# Patient Record
Sex: Male | Born: 1960
Health system: Southern US, Community
[De-identification: ages and names within clinical notes are randomized; demographics above are authoritative.]

## PROBLEM LIST (undated history)

## (undated) DIAGNOSIS — N189 Chronic kidney disease, unspecified: Secondary | ICD-10-CM

## (undated) DIAGNOSIS — Z86010 Personal history of colonic polyps: Secondary | ICD-10-CM

## (undated) DIAGNOSIS — Q613 Polycystic kidney, unspecified: Secondary | ICD-10-CM

## (undated) DIAGNOSIS — I10 Essential (primary) hypertension: Secondary | ICD-10-CM

## (undated) DIAGNOSIS — K219 Gastro-esophageal reflux disease without esophagitis: Secondary | ICD-10-CM

## (undated) DIAGNOSIS — I428 Other cardiomyopathies: Secondary | ICD-10-CM

## (undated) DIAGNOSIS — I440 Atrioventricular block, first degree: Secondary | ICD-10-CM

## (undated) DIAGNOSIS — E119 Type 2 diabetes mellitus without complications: Secondary | ICD-10-CM

## (undated) DIAGNOSIS — F1011 Alcohol abuse, in remission: Secondary | ICD-10-CM

## (undated) DIAGNOSIS — I509 Heart failure, unspecified: Secondary | ICD-10-CM

## (undated) DIAGNOSIS — E781 Pure hyperglyceridemia: Secondary | ICD-10-CM

## (undated) DIAGNOSIS — Z794 Long term (current) use of insulin: Secondary | ICD-10-CM

## (undated) DIAGNOSIS — I5022 Chronic systolic (congestive) heart failure: Secondary | ICD-10-CM

## (undated) DIAGNOSIS — N200 Calculus of kidney: Secondary | ICD-10-CM

## (undated) DIAGNOSIS — M1990 Unspecified osteoarthritis, unspecified site: Secondary | ICD-10-CM

## (undated) DIAGNOSIS — M109 Gout, unspecified: Secondary | ICD-10-CM

## (undated) DIAGNOSIS — D649 Anemia, unspecified: Secondary | ICD-10-CM

## (undated) HISTORY — DX: Other cardiomyopathies: I42.8

## (undated) HISTORY — PX: RHINOPLASTY: SUR1284

## (undated) HISTORY — PX: CARDIAC CATHETERIZATION: SHX172

## (undated) HISTORY — DX: Personal history of colonic polyps: Z86.010

## (undated) HISTORY — PX: KIDNEY STONE SURGERY: SHX686

## (undated) HISTORY — DX: Pure hyperglyceridemia: E78.1

## (undated) HISTORY — PX: TRANSTHORACIC ECHOCARDIOGRAM: SHX275

## (undated) HISTORY — DX: Chronic systolic (congestive) heart failure: I50.22

## (undated) HISTORY — DX: Essential (primary) hypertension: I10

## (undated) HISTORY — DX: Chronic kidney disease, unspecified: N18.9

## (undated) HISTORY — DX: Alcohol abuse, in remission: F10.11

## (undated) HISTORY — DX: Anemia, unspecified: D64.9

## (undated) HISTORY — PX: OTHER SURGICAL HISTORY: SHX169

## (undated) HISTORY — DX: Gout, unspecified: M10.9

---

## 2003-03-29 ENCOUNTER — Emergency Department (HOSPITAL_COMMUNITY): Admission: EM | Admit: 2003-03-29 | Discharge: 2003-03-29 | Payer: Self-pay | Admitting: Emergency Medicine

## 2003-05-19 ENCOUNTER — Emergency Department (HOSPITAL_COMMUNITY): Admission: EM | Admit: 2003-05-19 | Discharge: 2003-05-19 | Payer: Self-pay | Admitting: Emergency Medicine

## 2003-08-10 ENCOUNTER — Inpatient Hospital Stay (HOSPITAL_COMMUNITY): Admission: RE | Admit: 2003-08-10 | Discharge: 2003-08-12 | Payer: Self-pay | Admitting: Neurosurgery

## 2003-08-10 HISTORY — PX: LUMBAR FUSION: SHX111

## 2003-09-20 ENCOUNTER — Encounter: Admission: RE | Admit: 2003-09-20 | Discharge: 2003-09-20 | Payer: Self-pay | Admitting: Neurosurgery

## 2003-11-21 ENCOUNTER — Encounter: Admission: RE | Admit: 2003-11-21 | Discharge: 2003-11-21 | Payer: Self-pay | Admitting: Neurosurgery

## 2004-03-24 ENCOUNTER — Emergency Department (HOSPITAL_COMMUNITY): Admission: EM | Admit: 2004-03-24 | Discharge: 2004-03-24 | Payer: Self-pay | Admitting: *Deleted

## 2005-11-03 ENCOUNTER — Emergency Department (HOSPITAL_COMMUNITY): Admission: EM | Admit: 2005-11-03 | Discharge: 2005-11-03 | Payer: Self-pay | Admitting: Family Medicine

## 2005-12-04 ENCOUNTER — Emergency Department (HOSPITAL_COMMUNITY): Admission: EM | Admit: 2005-12-04 | Discharge: 2005-12-04 | Payer: Self-pay | Admitting: Family Medicine

## 2007-02-10 ENCOUNTER — Encounter: Admission: RE | Admit: 2007-02-10 | Discharge: 2007-03-03 | Payer: Self-pay | Admitting: Occupational Medicine

## 2007-11-30 ENCOUNTER — Encounter: Admission: RE | Admit: 2007-11-30 | Discharge: 2007-11-30 | Payer: Self-pay | Admitting: Internal Medicine

## 2010-03-24 ENCOUNTER — Emergency Department (HOSPITAL_COMMUNITY)
Admission: EM | Admit: 2010-03-24 | Discharge: 2010-03-24 | Payer: Self-pay | Source: Home / Self Care | Admitting: Family Medicine

## 2010-03-24 ENCOUNTER — Inpatient Hospital Stay (HOSPITAL_COMMUNITY): Admission: EM | Admit: 2010-03-24 | Discharge: 2010-03-27 | Payer: Self-pay | Admitting: Emergency Medicine

## 2010-03-25 ENCOUNTER — Encounter (INDEPENDENT_AMBULATORY_CARE_PROVIDER_SITE_OTHER): Payer: Self-pay | Admitting: Internal Medicine

## 2010-03-26 ENCOUNTER — Encounter: Payer: Self-pay | Admitting: Cardiology

## 2010-03-28 ENCOUNTER — Ambulatory Visit: Payer: Self-pay | Admitting: Cardiology

## 2010-03-28 ENCOUNTER — Telehealth (INDEPENDENT_AMBULATORY_CARE_PROVIDER_SITE_OTHER): Payer: Self-pay | Admitting: *Deleted

## 2010-03-29 LAB — CONVERTED CEMR LAB
BUN: 14 mg/dL (ref 6–23)
CO2: 28 meq/L (ref 19–32)
Calcium: 9.6 mg/dL (ref 8.4–10.5)
Chloride: 107 meq/L (ref 96–112)
Creatinine, Ser: 1.3 mg/dL (ref 0.4–1.5)
GFR calc non Af Amer: 73.36 mL/min (ref >60)
Glucose, Bld: 97 mg/dL (ref 70–99)
Potassium: 4.4 meq/L (ref 3.5–5.1)
Sodium: 142 meq/L (ref 135–145)

## 2010-04-02 DIAGNOSIS — I10 Essential (primary) hypertension: Secondary | ICD-10-CM

## 2010-04-02 DIAGNOSIS — F1010 Alcohol abuse, uncomplicated: Secondary | ICD-10-CM | POA: Insufficient documentation

## 2010-04-02 DIAGNOSIS — F17200 Nicotine dependence, unspecified, uncomplicated: Secondary | ICD-10-CM | POA: Insufficient documentation

## 2010-04-03 ENCOUNTER — Telehealth: Payer: Self-pay | Admitting: Cardiology

## 2010-04-03 ENCOUNTER — Encounter: Payer: Self-pay | Admitting: Physician Assistant

## 2010-04-03 ENCOUNTER — Ambulatory Visit: Payer: Self-pay | Admitting: Cardiovascular Disease

## 2010-04-03 DIAGNOSIS — I428 Other cardiomyopathies: Secondary | ICD-10-CM

## 2010-04-03 DIAGNOSIS — I5022 Chronic systolic (congestive) heart failure: Secondary | ICD-10-CM

## 2010-04-04 ENCOUNTER — Telehealth (INDEPENDENT_AMBULATORY_CARE_PROVIDER_SITE_OTHER): Payer: Self-pay | Admitting: *Deleted

## 2010-04-04 LAB — CONVERTED CEMR LAB
BUN: 21 mg/dL (ref 6–23)
CO2: 31 meq/L (ref 19–32)
Calcium: 10.2 mg/dL (ref 8.4–10.5)
Chloride: 105 meq/L (ref 96–112)
Creatinine, Ser: 1.4 mg/dL (ref 0.4–1.5)
GFR calc non Af Amer: 67.46 mL/min (ref >60)
Glucose, Bld: 104 mg/dL — ABNORMAL HIGH (ref 70–99)
Potassium: 4 meq/L (ref 3.5–5.1)
Sodium: 143 meq/L (ref 135–145)

## 2010-04-05 ENCOUNTER — Telehealth (INDEPENDENT_AMBULATORY_CARE_PROVIDER_SITE_OTHER): Payer: Self-pay | Admitting: *Deleted

## 2010-04-08 ENCOUNTER — Telehealth (INDEPENDENT_AMBULATORY_CARE_PROVIDER_SITE_OTHER): Payer: Self-pay | Admitting: *Deleted

## 2010-04-09 ENCOUNTER — Encounter (INDEPENDENT_AMBULATORY_CARE_PROVIDER_SITE_OTHER): Payer: Self-pay | Admitting: *Deleted

## 2010-04-09 ENCOUNTER — Telehealth: Payer: Self-pay | Admitting: Cardiology

## 2010-04-29 ENCOUNTER — Telehealth: Payer: Self-pay | Admitting: Cardiology

## 2010-05-07 ENCOUNTER — Ambulatory Visit: Payer: Self-pay | Admitting: Cardiology

## 2010-05-21 ENCOUNTER — Telehealth: Payer: Self-pay | Admitting: Cardiology

## 2010-05-27 ENCOUNTER — Emergency Department (HOSPITAL_COMMUNITY)
Admission: EM | Admit: 2010-05-27 | Discharge: 2010-05-27 | Payer: Self-pay | Source: Home / Self Care | Admitting: Family Medicine

## 2010-06-03 LAB — POCT URINALYSIS DIPSTICK
Bilirubin Urine: NEGATIVE
Hgb urine dipstick: NEGATIVE
Ketones, ur: NEGATIVE mg/dL
Nitrite: NEGATIVE
Protein, ur: NEGATIVE mg/dL
Specific Gravity, Urine: 1.025 (ref 1.005–1.030)
Urine Glucose, Fasting: NEGATIVE mg/dL
Urobilinogen, UA: 0.2 mg/dL (ref 0.0–1.0)
pH: 6.5 (ref 5.0–8.0)

## 2010-06-17 ENCOUNTER — Telehealth: Payer: Self-pay | Admitting: Internal Medicine

## 2010-06-17 ENCOUNTER — Other Ambulatory Visit: Payer: Self-pay | Admitting: Internal Medicine

## 2010-06-17 ENCOUNTER — Ambulatory Visit
Admission: RE | Admit: 2010-06-17 | Discharge: 2010-06-17 | Payer: Self-pay | Source: Home / Self Care | Attending: Internal Medicine | Admitting: Internal Medicine

## 2010-06-17 ENCOUNTER — Encounter: Payer: Self-pay | Admitting: Internal Medicine

## 2010-06-17 DIAGNOSIS — R109 Unspecified abdominal pain: Secondary | ICD-10-CM | POA: Insufficient documentation

## 2010-06-17 DIAGNOSIS — J3089 Other allergic rhinitis: Secondary | ICD-10-CM | POA: Insufficient documentation

## 2010-06-17 LAB — BASIC METABOLIC PANEL
BUN: 14 mg/dL (ref 6–23)
CO2: 29 mEq/L (ref 19–32)
Calcium: 9.2 mg/dL (ref 8.4–10.5)
Chloride: 104 mEq/L (ref 96–112)
Creatinine, Ser: 1 mg/dL (ref 0.4–1.5)
GFR: 105.49 mL/min (ref >60.00)
Glucose, Bld: 94 mg/dL (ref 70–99)
Potassium: 4 mEq/L (ref 3.5–5.1)
Sodium: 139 mEq/L (ref 135–145)

## 2010-06-17 LAB — CONVERTED CEMR LAB

## 2010-06-17 LAB — URINALYSIS, ROUTINE W REFLEX MICROSCOPIC
Bilirubin Urine: NEGATIVE
Hemoglobin, Urine: NEGATIVE
Ketones, ur: NEGATIVE
Leukocytes, UA: NEGATIVE
Nitrite: NEGATIVE
Specific Gravity, Urine: 1.015 (ref 1.000–1.030)
Total Protein, Urine: NEGATIVE
Urine Glucose: NEGATIVE
Urobilinogen, UA: 0.2 (ref 0.0–1.0)
pH: 6 (ref 5.0–8.0)

## 2010-06-17 LAB — CBC WITH DIFFERENTIAL/PLATELET
Basophils Absolute: 0 10*3/uL (ref 0.0–0.1)
Basophils Relative: 0.4 % (ref 0.0–3.0)
Eosinophils Absolute: 0.1 10*3/uL (ref 0.0–0.7)
Eosinophils Relative: 2.2 % (ref 0.0–5.0)
HCT: 35.9 % — ABNORMAL LOW (ref 39.0–52.0)
Hemoglobin: 12 g/dL — ABNORMAL LOW (ref 13.0–17.0)
Lymphocytes Relative: 35.6 % (ref 12.0–46.0)
Lymphs Abs: 1.6 10*3/uL (ref 0.7–4.0)
MCHC: 33.5 g/dL (ref 30.0–36.0)
MCV: 92.3 fl (ref 78.0–100.0)
Monocytes Absolute: 0.3 10*3/uL (ref 0.1–1.0)
Monocytes Relative: 5.6 % (ref 3.0–12.0)
Neutro Abs: 2.6 10*3/uL (ref 1.4–7.7)
Neutrophils Relative %: 56.2 % (ref 43.0–77.0)
Platelets: 298 10*3/uL (ref 150.0–400.0)
RBC: 3.89 Mil/uL — ABNORMAL LOW (ref 4.22–5.81)
RDW: 13.4 % (ref 11.5–14.6)
WBC: 4.6 10*3/uL (ref 4.5–10.5)

## 2010-06-17 LAB — HEPATIC FUNCTION PANEL
ALT: 35 U/L (ref 0–53)
AST: 25 U/L (ref 0–37)
Albumin: 4.2 g/dL (ref 3.5–5.2)
Alkaline Phosphatase: 62 U/L (ref 39–117)
Bilirubin, Direct: 0.1 mg/dL (ref 0.0–0.3)
Total Bilirubin: 0.8 mg/dL (ref 0.3–1.2)
Total Protein: 7.6 g/dL (ref 6.0–8.3)

## 2010-06-17 LAB — TSH: TSH: 1.71 u[IU]/mL (ref 0.35–5.50)

## 2010-06-17 LAB — PSA: PSA: 0.91 ng/mL (ref 0.10–4.00)

## 2010-06-17 LAB — AMYLASE: Amylase: 85 U/L (ref 27–131)

## 2010-06-17 LAB — LIPASE: Lipase: 25 U/L (ref 11.0–59.0)

## 2010-06-18 NOTE — Progress Notes (Signed)
Summary: paperwork signed? pt wants call**pt has question**  Phone Note Call from Patient Call back at Home Phone [phone]   Caller: Patient  Reason for Call: Talk to Nurse Summary of Call: pt calling to see if paperwork has been signed -needs to pick up asap-pls call either way [phone] Initial call taken by: Glynda Jaeger,  April 08, 2010 11:02 AM  Follow-up for Phone Call        spoke with pt, will need to contact [employer] to find out about paperwork Deliah Goody, RN  April 08, 2010 5:06 PM   Additional Follow-up for Phone Call Additional follow up Details #1::        pt wants to know what paperwork do you have [place] or fmla Omer Jack  April 09, 2010 9:16 AM   spoke with pt, paperwork here for dr Jens Som to sign. pt aware dr Jens Som out until monday. pt very upset and wants to talk to the person over [employer]. information given to pt Deliah Goody, RN  April 09, 2010 10:06 AM      Appended Document: paperwork signed? pt wants call**pt has question** Returned call to [facility] and spoke to patient. Leave specialist Hubbard Robinson on vacation until next week concerning FMLA paperwork. Mr Geesey is ok with delay for FMLA form.   Called [employer] to inform them of Dr. Ludwig Clarks return date of 10/28. Claim is good until Tuesday 11/29. OK to fax after completion.  [employer] Fax [phone], Phone [phone].On fax cover sheet please indicate insured ID Number [id number].  Called Mr. Froelich back-he is satisfied with outcome. Lenard Forth, April 09, 2010 10:17am

## 2010-06-18 NOTE — Progress Notes (Signed)
----   Converted from flag ---- Flag Received   ---- 04/05/2010 12:21 PM, Bary Leriche wrote: Hello,  I am sending Dr. Jens Som  three illness statements, for his patient Mike Orozco dob. [birth year]. Some have highlighted areas that the Dr. Lasandra Beech review.  I could not answer these questions by looking at the office notes, so they are highlighted for the Dr.  Since there are 3 different forms, hence they need three signatures by Dr.   The patient is eager to have these forms completed so he can receive payment from his enployer and [employer].  If you could expidite I am sure Mr. Graumann would be most appreciative. Thank you, Elease Hashimoto at [employer]. ------------------------------

## 2010-06-18 NOTE — Progress Notes (Signed)
  Walk in Patient Form Recieved " pt left FMLA papers" sent to [facility]  March 28, 2010 1:01 PM

## 2010-06-18 NOTE — Letter (Signed)
Summary: Return To Work  [employer], Main Office  [address]   [place], [place] [address]   Phone: [phone]  Fax: [phone]    04/09/2010  TO: WHOM IT MAY CONCERN   RE: Mike Orozco [address] [id number]   The above named individual is under my medical care and may return to work [id number] 04-10-10 WITH NO RESTRICTIONS  If you have any further questions or need additional information, please call.     Sincerely, Deliah Goody, RN/Dr Olga Millers

## 2010-06-18 NOTE — Cardiovascular Report (Signed)
Summary: [facility]   [facility]   Imported By: Earl Many 04/09/2010 15:11:39  _____________________________________________________________________  External Attachment:    Type:   Image     Comment:   External Document

## 2010-06-18 NOTE — Progress Notes (Signed)
Summary: PT NEEDS NOTE FOR WORK  Phone Note Call from Patient Call back at Home Phone [phone]   Caller: Patient Reason for Call: Talk to Nurse, Talk to Doctor Summary of Call: PT NEEDS A MD NOTE TO RETURN TO WORK TOMORROW Initial call taken by: Omer Jack,  April 09, 2010 11:05 AM  Follow-up for Phone Call        note generated and placed at the front desk for pick up, pt aware Deliah Goody, RN  April 09, 2010 11:26 AM

## 2010-06-18 NOTE — Progress Notes (Signed)
Summary: form re going back to work  Phone Note Call from Patient Call back at [employer] [phone]   Caller: Patient Reason for Call: Talk to Nurse Summary of Call: pt would like to know if you receive forms to return to work. Initial call taken by: Roe Coombs,  April 03, 2010 3:37 PM  Follow-up for Phone Call        He gave them to me today. I had them sent to [employer].  Follow-up by: Tereso Newcomer PA-C,  April 03, 2010 4:47 PM  Additional Follow-up for Phone Call Additional follow up Details #1::        pt aware Deliah Goody, RN  April 03, 2010 5:15 PM

## 2010-06-18 NOTE — Progress Notes (Signed)
  Papers left @ from Desk by Pt to be completed " from [facility] ) sent to [facility]  April 04, 2010 10:15 AM

## 2010-06-18 NOTE — Assessment & Plan Note (Signed)
Summary: EPH/MT   History of Present Illness: Primary Cardiologist:  Dr. Olga Millers  Mike Orozco is a 50 year old male who was recently admitted with acute systolic heart failure in the setting of hypertensive crisis.  Cardiac catheterization revealed normal coronary arteries.  He has a nonischemic cardiomyopathy.  He was diuresed with improvement in his symptoms and he returns to the office today for followup.  Since discharge, he is doing well.  He denies significant shortness of breath.  He describes New York Heart Association class II symptoms.  He denies orthopnea or PND.  He denies lower extremity edema.  He denies chest pain.  He denies palpitations.  He denies syncope.  It was felt that his nonischemic cardio myopathy was likely secondary to untreated hypertension and alcohol abuse.  The patient states that he has stopped drinking alcohol and has also quit smoking cigarettes.  He is taking all of his medications.  Current Medications (verified): 1)  Aspirin 81 Mg Tbec (Aspirin) .... Take One Tablet By Mouth Daily 2)  Carvedilol 25 Mg Tabs (Carvedilol) .... Take One Tablet By Mouth Twice A Day 3)  Furosemide 40 Mg Tabs (Furosemide) .... Take One Tablet By Mouth Daily. 4)  Lisinopril 40 Mg Tabs (Lisinopril) .... Take One Tablet By Mouth Daily  Past History:  Past Medical History: Chronic Systolic congestive heart failure Non Ischemic Cardiomyopathy    a.  Echo 03/25/2010:  EF 20-25%; mild AI; Mod MR; Mild LAE    b.  Cardiac cath 03/26/2010:  normal cors; severe pulmonary HTN; PCWP 41; EF 10-15% h/o EtOH abuse.   Past Surgical History: Removal of a cyst from his left lung in 1994 at [facility].  Status post L5-S1 fusion.      Family History: Reviewed history from 04/02/2010 and no changes required.  Significant for diabetes and hypertension in both   parents and multiple siblings.   Social History: Smoker . . .quit 02/2010 + alcohol abuse . . . quit 02/2010 Denies illicit drug  use.   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  Vital Signs:  Patient profile:   50 year old male Height:      66 inches (167.64 cm) Weight:      192 pounds (87.27 kg) BMI:     31.10 Pulse rate:   66 / minute Resp:     14 per minute BP sitting:   138 / 89  (left arm)  Vitals Entered By: Kem Parkinson (April 03, 2010 8:32 AM)  Physical Exam  General:  Well nourished, well developed, in no acute distress HEENT: normal Neck: no JVD Cardiac:  normal S1, S2; RRR; no murmur; no gallop Lungs:  clear to auscultation bilaterally, no wheezing, rhonchi or rales Abd: soft, nontender, no hepatomegaly Ext: no edema; R FA site without hematoma or bruit Vascular: no carotid  bruits Skin: warm and dry Neuro:  CNs 2-12 intact, no focal abnormalities noted    EKG  Procedure date:  04/03/2010  Findings:      Normal sinus rhythm Normal axis LVH with repolarization abnormality Heart rate 62  Impression & Recommendations:  Problem # 1:  CARDIOMYOPATHY (ICD-425.4)  Nonischemic related to alcohol and untreated HTN. BP improved. Symptoms are stable on a good medical regimen. Repeat Echo in 06/2010 to reassess EF. Add hydralazine as below. Follow up with Dr. Jens Som in 1 month.  Problem # 2:  CHRONIC SYSTOLIC HEART FAILURE (ICD-428.22)  Optivolemic today. Check BMET.  Orders: TLB-BMP (  Basic Metabolic Panel-BMET) (80048-METABOL) Echocardiogram (Echo)  Problem # 3:  HYPERTENSION (ICD-401.9)  Still elevated.  BP by me 134/100. Add Hydralazine 10 mg three times a day. Check BMET today.  Problem # 4:  TOBACCO ABUSE (ICD-305.1)  He has quit.  Problem # 5:  ALCOHOL ABUSE (ICD-305.00) He has quit.  Other Orders: EKG w/ Interpretation (93000)  Patient Instructions: 1)  Your physician recommends that you schedule a follow-up appointment in: 4 WEEKS WITH CRENSHAW 2)  Your physician recommends that you return for lab work ZO:XWRUE FOR BMET 3)  Your  physician has recommended you make the following change in your medication: START HYDRALAZINE 10MG  three times a day   4)  NEED RTW NOTE IN 1 WEEK. Prescriptions: HYDRALAZINE HCL 10 MG TABS (HYDRALAZINE HCL) Take one tablet by mouth three times a day  #90 x 6   Entered by:   Dessie Coma  LPN   Authorized by:   Tereso Newcomer PA-C   Signed by:   Dessie Coma  LPN on 45/40/9811   Method used:   [facility] [phone][facility] (retail)       [address]       [place], [place]  [address]       Ph: [phone]       Fax: [phone]   RxID:   [id number]  I have personally reviewed the prescriptions today for accuracy. Tereso Newcomer PA-C  April 03, 2010 4:48 PM

## 2010-06-20 NOTE — Progress Notes (Signed)
Summary: medication question  Phone Note Call from Patient Call back at [phone]   Caller: Patient Reason for Call: Talk to Nurse, Talk to Doctor Summary of Call: pt was taking HYDRALAZINE HCL 10 MG TABS Take one tablet by mouth three times a dayand at last visit he was told to take two pills a day until this was gone and we would send in a new Rx. Well he finished what he had but when he went to [facility] on [facility] they gave him the same medicine so he needs to know how he should be taking it or has it been a change and what he needs to do Initial call taken by: Omer Jack,  May 21, 2010 3:41 PM  Follow-up for Phone Call        spoke with pt, hydralazine should be 25mg  three times a day. pt aware to take 2 1/2 tablets of 10mg  three times a day until he runs out. Deliah Goody, RN  May 21, 2010 4:01 PM     New/Updated Medications: HYDRALAZINE HCL 25 MG TABS (HYDRALAZINE HCL) Take one tablet by mouth three times a day Prescriptions: HYDRALAZINE HCL 25 MG TABS (HYDRALAZINE HCL) Take one tablet by mouth three times a day  #90 x 12   Entered by:   Deliah Goody, RN   Authorized by:   Ferman Hamming, MD, [facility]   Signed by:   Deliah Goody, RN on 05/21/2010   Method used:   [facility] to        [facility] [phone]* (retail)       [address]       [place], [place]  [address]       Ph: [phone]       Fax: [phone]   RxID:   [id number]

## 2010-06-20 NOTE — Assessment & Plan Note (Signed)
Summary: per check out/sf   Visit Type:  Follow-up   History of Present Illness: 50 year old male recently admitted to [facility] in November of 2011 with congestive heart failure. An echocardiogram showed an ejection fraction of 20-25% and moderate mitral regurgitation. There was mild aortic insufficiency and mild left atrial enlargement. Cardiac catheterization revealed an ejection fraction of 10-15% and normal coronary arteries. His left ventricular end-diastolic pressure was 35 and his pulmonary pressures were severely elevated. The patient was treated medically with improvement in his symptoms. Note his cardiomyopathy was felt most likely secondary to hypertension or alcohol. Since he was discharged he is doing well symptomatically. He denies dyspnea on exertion, orthopnea, PND, pedal edema, palpitations, syncope or chest pain. He is being compliant with his medications and diet. He has discontinued his alcohol use.  Current Medications (verified): 1)  Aspirin 81 Mg Tbec (Aspirin) .... Take One Tablet By Mouth Daily 2)  Carvedilol 25 Mg Tabs (Carvedilol) .... Take One Tablet By Mouth Twice A Day 3)  Furosemide 40 Mg Tabs (Furosemide) .... Take One Tablet By Mouth Daily. 4)  Lisinopril 40 Mg Tabs (Lisinopril) .... Take One Tablet By Mouth Daily 5)  Hydralazine Hcl 10 Mg Tabs (Hydralazine Hcl) .... Take One Tablet By Mouth Three Times A Day  Allergies (verified): No Known Drug Allergies  Past History:  Past Medical History: Reviewed history from 04/03/2010 and no changes required. Chronic Systolic congestive heart failure Non Ischemic Cardiomyopathy    a.  Echo 03/25/2010:  EF 20-25%; mild AI; Mod MR; Mild LAE    b.  Cardiac cath 03/26/2010:  normal cors; severe pulmonary HTN; PCWP 41; EF 10-15% h/o EtOH abuse.   Past Surgical History: Reviewed history from 04/03/2010 and no changes required. Removal of a cyst from his left lung in 1994 at [facility].  Status post L5-S1 fusion.      Social History: Reviewed history from 04/03/2010 and no changes required. Smoker . . .quit 02/2010 + alcohol abuse . . . quit 02/2010 Denies illicit drug use.   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 Remaining systems are negative.   Vital Signs:  Patient profile:   50 year old male Height:      66 inches Weight:      191 pounds Pulse rate:   64 / minute Pulse rhythm:   regular BP sitting:   144 / 100  (left arm)  Physical Exam  General:  Well-developed well-nourished in no acute distress.  Skin is warm and dry.  HEENT is normal.  Neck is supple. No thyromegaly.  Chest is clear to auscultation with normal expansion.  Cardiovascular exam is regular rate and rhythm.  Abdominal exam nontender or distended. No masses palpated. Extremities show no edema. neuro grossly intact    Impression & Recommendations:  Problem # 1:  CARDIOMYOPATHY (ICD-425.4) Plan continue present medications. Blood pressure is elevated. Increase hydralazine to 25 mg p.o. t.i.d. Once his medications have been titrated plan repeat echocardiogram. If ejection fraction less than or equal to 35% he will need an ICD. His updated medication list for this problem includes:    Aspirin 81 Mg Tbec (Aspirin) .Marland Kitchen... Take one tablet by mouth daily    Carvedilol 25 Mg Tabs (Carvedilol) .Marland Kitchen... Take one tablet by mouth twice  a day    Furosemide 40 Mg Tabs (Furosemide) .Marland Kitchen... Take one tablet by mouth daily.    Lisinopril 40 Mg Tabs (Lisinopril) .Marland Kitchen... Take one tablet by mouth daily  Problem # 2:  ALCOHOL ABUSE (ICD-305.00) Now resolved.  Problem # 3:  TOBACCO ABUSE (ICD-305.1) Now resolved.  Problem # 4:  HYPERTENSION (ICD-401.9) Blood pressure elevated. Increase hydralazine as described  above. He will check his blood pressure at home and bring records for his next visit. His updated medication list for this problem includes:    Aspirin 81 Mg Tbec (Aspirin) .Marland Kitchen... Take one tablet by mouth daily    Carvedilol 25 Mg Tabs (Carvedilol) .Marland Kitchen... Take one tablet by mouth twice a day    Furosemide 40 Mg Tabs (Furosemide) .Marland Kitchen... Take one tablet by mouth daily.    Lisinopril 40 Mg Tabs (Lisinopril) .Marland Kitchen... Take one tablet by mouth daily    Hydralazine Hcl 10 Mg Tabs (Hydralazine hcl) .Marland Kitchen... Take one tablet by mouth three times a day  Patient Instructions: 1)  Your physician recommends that you schedule a follow-up appointment in: 8 WEEKS WITH DR CRENSHAW 2)  Your physician has recommended you make the following change in your medication: INCREASE HYDRALAZINE 25 MG three times a day

## 2010-06-20 NOTE — Progress Notes (Signed)
Summary: refill meds  Phone Note Refill Request Call back at Home Phone [phone] Message from:  Patient on April 29, 2010 10:43 AM  Refills Requested: Medication #1:  CARVEDILOL 25 MG TABS Take one tablet by mouth twice a day [phone]   Method Requested: Fax to Local Pharmacy Initial call taken by: Lorne Skeens,  April 29, 2010 10:44 AM    Prescriptions: CARVEDILOL 25 MG TABS (CARVEDILOL) Take one tablet by mouth twice a day  #60 x 12   Entered by:   Kem Parkinson   Authorized by:   Ferman Hamming, MD, [facility]   Signed by:   Kem Parkinson on 04/29/2010   Method used:   [facility]ronically to        [facility] [phone]* (retail)       [address]       [place], [place]  [address]       Ph: [phone]       Fax: [phone]   RxID:   [id number]

## 2010-06-20 NOTE — Miscellaneous (Signed)
  Clinical Lists Changes  Orders: Added new Referral order of Primary Care Referral (Primary) - Signed

## 2010-06-26 NOTE — Letter (Signed)
Summary: Results Follow-up Letter  [facility]  [address] [place], [place] [address]   Phone: [phone]  Fax: [phone]    06/17/2010  [address] [place], [place]  [address]  Dear Mr. RAUDENBUSH,   The following are the results of your recent test(s):  Test     Result     CBC       mild anemia Prostate     normal Liver/kidney   normal Urine       normal Thyroid     normal   _________________________________________________________  Please call for an appointment in 2-3 weeks for more anemia testing _________________________________________________________ _________________________________________________________ _________________________________________________________  Sincerely,  Sanda Linger MD [facility]

## 2010-06-26 NOTE — Assessment & Plan Note (Signed)
Summary: NEW/ [employer] #   Vital Signs:  Patient profile:   50 year old male Height:      66 inches Weight:      191 pounds BMI:     30.94 O2 Sat:      99 % on Room air Temp:     98.0 degrees F oral Pulse rate:   54 / minute Pulse rhythm:   regular Resp:     16 per minute BP sitting:   130 / 88  (left arm) Cuff size:   large  Vitals Entered By: Rock Nephew CMA (June 17, 2010 9:02 AM)  Nutrition Counseling: Patient's BMI is greater than 25 and therefore counseled on weight management options.  O2 Flow:  Room air CC: New to establish, Is Patient Diabetic? No Pain Assessment Patient in pain? no       Does patient need assistance? Functional Status Self care Ambulation Normal   Primary Care Provider:  Etta Grandchild MD  CC:  New to establish and .  History of Present Illness: New to me he complains of right flank pain for 3 weeks and he tells me that he went to an [facility] -  the pain is throbbing and intermittent. He has not taken anything for the pain. The pain is in his right lower/posterior area with no radiation. He does not fell like it is low back or hip pain.  Also, he complains of runny nose and nasal congestion for 3 days. He has been taking Tylenol cold and sinus.  Preventive Screening-Counseling & Management  Alcohol-Tobacco     Alcohol drinks/day: 0     Alcohol Counseling: not indicated; patient does not drink     Smoking Status: quit < 6 months     Year Quit: 2011     Tobacco Counseling: to remain off tobacco products  Caffeine-Diet-Exercise     Does Patient Exercise: no  Hep-HIV-STD-Contraception     Hepatitis Risk: no risk noted     HIV Risk: no risk noted     STD Risk: no risk noted      Sexual History:  currently monogamous.        Drug Use:  no.        Blood Transfusions:  no.    Clinical Review Panels:  Diabetes Management   Creatinine:  1.4 (04/03/2010)  Complete Metabolic Panel   Glucose:  104 (04/03/2010)   Sodium:  143  (04/03/2010)   Potassium:  4.0 (04/03/2010)   Chloride:  105 (04/03/2010)   CO2:  31 (04/03/2010)   BUN:  21 (04/03/2010)   Creatinine:  1.4 (04/03/2010)   Calcium:  10.2 (04/03/2010)   Medications Prior to Update: 1)  Aspirin 81 Mg Tbec (Aspirin) .... Take One Tablet By Mouth Daily 2)  Carvedilol 25 Mg Tabs (Carvedilol) .... Take One Tablet By Mouth Twice A Day 3)  Furosemide 40 Mg Tabs (Furosemide) .... Take One Tablet By Mouth Daily. 4)  Lisinopril 40 Mg Tabs (Lisinopril) .... Take One Tablet By Mouth Daily 5)  Hydralazine Hcl 25 Mg Tabs (Hydralazine Hcl) .... Take One Tablet By Mouth Three Times A Day  Current Medications (verified): 1)  Aspirin 81 Mg Tbec (Aspirin) .... Take One Tablet By Mouth Daily 2)  Carvedilol 25 Mg Tabs (Carvedilol) .... Take One Tablet By Mouth Twice A Day 3)  Furosemide 40 Mg Tabs (Furosemide) .... Take One Tablet By Mouth Daily. 4)  Lisinopril 40 Mg Tabs (Lisinopril) .... Take One Tablet By  Mouth Daily 5)  Hydralazine Hcl 25 Mg Tabs (Hydralazine Hcl) .... Take One Tablet By Mouth Three Times A Day 6)  Fexofenadine Hcl 180 Mg Tabs (Fexofenadine Hcl) .... One By Mouth Once Daily For Nasal Congestion  Allergies (verified): No Known Drug Allergies  Past History:  Past Medical History: Last updated: 04/03/2010 Chronic Systolic congestive heart failure Non Ischemic Cardiomyopathy    a.  Echo 03/25/2010:  EF 20-25%; mild AI; Mod MR; Mild LAE    b.  Cardiac cath 03/26/2010:  normal cors; severe pulmonary HTN; PCWP 41; EF 10-15% h/o EtOH abuse.   Past Surgical History: Last updated: 04/03/2010 Removal of a cyst from his left lung in 1994 at [facility].  Status post L5-S1 fusion.      Family History: Last updated: 04/02/2010  Significant for diabetes and hypertension in both   parents and multiple siblings.   Social History: Last updated: 06/17/2010 Smoker . . .quit 02/2010 + alcohol abuse . . . quit 02/2010 Denies illicit drug use.   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   He is married Drug use-no Regular exercise-no  Risk Factors: Alcohol Use: 0 (06/17/2010) Exercise: no (06/17/2010)  Risk Factors: Smoking Status: quit < 6 months (06/17/2010)  Family History: Reviewed history from 04/02/2010 and no changes required.  Significant for diabetes and hypertension in both   parents and multiple siblings.   Social History: Reviewed history from 04/03/2010 and no changes required. Smoker . . .quit 02/2010 + alcohol abuse . . . quit 02/2010 Denies illicit drug use.   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 Drug use-no Regular exercise-no Smoking Status:  quit < 6 months Hepatitis Risk:  no risk noted HIV Risk:  no risk noted STD Risk:  no risk noted Sexual History:  currently monogamous Blood Transfusions:  no Drug Use:  no Does Patient Exercise:  no  Review of Systems  The patient denies anorexia, fever, weight loss, weight gain, hoarseness, chest pain, syncope, dyspnea on exertion, peripheral edema, prolonged cough, headaches, hemoptysis, melena, hematochezia, severe indigestion/heartburn, hematuria, incontinence, muscle weakness, suspicious skin lesions, transient blindness, difficulty walking, enlarged lymph nodes, angioedema, and testicular masses.   General:  Denies chills, fatigue, fever, loss of appetite, malaise, sleep disorder, and sweats. ENT:  Complains of nasal congestion and postnasal drainage; denies decreased hearing, difficulty swallowing, ear discharge, earache, hoarseness, nosebleeds, ringing in ears, sinus pressure, and sore throat. GI:  Denies abdominal pain, diarrhea, loss of appetite, nausea, vomiting, vomiting blood, and yellowish skin color. GU:  Denies discharge, dysuria, hematuria, incontinence, nocturia, urinary frequency, and urinary hesitancy.  Physical Exam  General:  alert, well-developed, well-nourished, well-hydrated, appropriate dress, normal appearance,  healthy-appearing, cooperative to examination, good hygiene, and overweight-appearing.   Head:  normocephalic, atraumatic, no abnormalities observed, and no abnormalities palpated.   Eyes:  vision grossly intact, pupils equal, and pupils round.   Ears:  R ear normal and L ear normal.   Nose:  no external deformity, no airflow obstruction, no intranasal foreign body, no nasal polyps, no nasal mucosal lesions, no mucosal friability, no active bleeding or clots, no sinus percussion tenderness, no septum abnormalities, mucosal erythema, and mucosal edema.   Mouth:  good dentition, pharynx pink and moist, no erythema, no exudates, no posterior lymphoid hypertrophy, no postnasal drip, no pharyngeal crowing, no lesions, no aphthous ulcers, no erosions, no tongue abnormalities, no leukoplakia, and no petechiae.   Neck:  supple, full ROM, no masses, no thyromegaly,  no thyroid nodules or tenderness, no JVD, no HJR, normal carotid upstroke, no carotid bruits, no cervical lymphadenopathy, and no neck tenderness.   Lungs:  normal respiratory effort, no intercostal retractions, no accessory muscle use, normal breath sounds, no dullness, no fremitus, no crackles, and no wheezes.   Heart:  normal rate, regular rhythm, no murmur, no gallop, no rub, and no JVD.   Abdomen:  soft, non-tender, normal bowel sounds, no distention, no masses, no guarding, no rigidity, no rebound tenderness, no abdominal hernia, no inguinal hernia, no hepatomegaly, and no splenomegaly.   Rectal:  No external abnormalities noted. Normal sphincter tone. No rectal masses or tenderness. Genitalia:  uncircumcised, no hydrocele, no varicocele, no scrotal masses, no testicular masses or atrophy, no cutaneous lesions, and no urethral discharge.   Prostate:  no gland enlargement, no nodules, no asymmetry, and no induration.   Msk:  normal ROM, no joint tenderness, no joint swelling, no joint warmth, no redness over joints, no joint deformities, no joint  instability, and no crepitation.   Pulses:  R and L carotid,radial,femoral,dorsalis pedis and posterior tibial pulses are full and equal bilaterally Extremities:  No clubbing, cyanosis, edema, or deformity noted with normal full range of motion of all joints.   Neurologic:  No cranial nerve deficits noted. Station and gait are normal. Plantar reflexes are down-going bilaterally. DTRs are symmetrical throughout. Sensory, motor and coordinative functions appear intact. Skin:  turgor normal, color normal, no rashes, no suspicious lesions, no ecchymoses, no petechiae, no purpura, no ulcerations, and no edema.   Cervical Nodes:  no anterior cervical adenopathy and no posterior cervical adenopathy.   Axillary Nodes:  no R axillary adenopathy and no L axillary adenopathy.   Inguinal Nodes:  no R inguinal adenopathy and no L inguinal adenopathy.   Psych:  Cognition and judgment appear intact. Alert and cooperative with normal attention span and concentration. No apparent delusions, illusions, hallucinations   Impression & Recommendations:  Problem # 1:  ALLERGIC RHINITIS DUE TO OTHER ALLERGEN (ICD-477.8) Assessment New start Allegra, stop Tylenol cold and sinus due to CHF/Htn and concern about decongestants Orders: Venipuncture (16109) TLB-BMP (Basic Metabolic Panel-BMET) (80048-METABOL) TLB-CBC Platelet - w/Differential (85025-CBCD) TLB-Hepatic/Liver Function Pnl (80076-HEPATIC) TLB-TSH (Thyroid Stimulating Hormone) (84443-TSH) TLB-Amylase (82150-AMYL) TLB-Lipase (83690-LIPASE) TLB-PSA (Prostate Specific Antigen) (84153-PSA) TLB-Udip w/ Micro (81001-URINE)  Problem # 2:  FLANK PAIN, RIGHT (ICD-789.09) Assessment: New will look for secondary causes His updated medication list for this problem includes:    Aspirin 81 Mg Tbec (Aspirin) .Marland Kitchen... Take one tablet by mouth daily  Orders: Venipuncture (60454) TLB-BMP (Basic Metabolic Panel-BMET) (80048-METABOL) TLB-CBC Platelet - w/Differential  (85025-CBCD) TLB-Hepatic/Liver Function Pnl (80076-HEPATIC) TLB-TSH (Thyroid Stimulating Hormone) (84443-TSH) TLB-Amylase (82150-AMYL) TLB-Lipase (83690-LIPASE) TLB-PSA (Prostate Specific Antigen) (84153-PSA) TLB-Udip w/ Micro (81001-URINE)  Problem # 3:  CHRONIC SYSTOLIC HEART FAILURE (ICD-428.22) Assessment: Improved  His updated medication list for this problem includes:    Aspirin 81 Mg Tbec (Aspirin) .Marland Kitchen... Take one tablet by mouth daily    Carvedilol 25 Mg Tabs (Carvedilol) .Marland Kitchen... Take one tablet by mouth twice a day    Furosemide 40 Mg Tabs (Furosemide) .Marland Kitchen... Take one tablet by mouth daily.    Lisinopril 40 Mg Tabs (Lisinopril) .Marland Kitchen... Take one tablet by mouth daily  Problem # 4:  HYPERTENSION (ICD-401.9) Assessment: Improved  His updated medication list for this problem includes:    Carvedilol 25 Mg Tabs (Carvedilol) .Marland Kitchen... Take one tablet by mouth twice a day    Furosemide 40 Mg Tabs (Furosemide) .Marland KitchenMarland KitchenMarland KitchenMarland Kitchen  Take one tablet by mouth daily.    Lisinopril 40 Mg Tabs (Lisinopril) .Marland Kitchen... Take one tablet by mouth daily    Hydralazine Hcl 25 Mg Tabs (Hydralazine hcl) .Marland Kitchen... Take one tablet by mouth three times a day  Orders: Venipuncture (16109) TLB-BMP (Basic Metabolic Panel-BMET) (80048-METABOL) TLB-CBC Platelet - w/Differential (85025-CBCD) TLB-Hepatic/Liver Function Pnl (80076-HEPATIC) TLB-TSH (Thyroid Stimulating Hormone) (84443-TSH) TLB-Amylase (82150-AMYL) TLB-Lipase (83690-LIPASE) TLB-PSA (Prostate Specific Antigen) (84153-PSA) TLB-Udip w/ Micro (81001-URINE)  BP today: 130/88 Prior BP: 144/100 (05/07/2010)  Labs Reviewed: K+: 4.0 (04/03/2010) Creat: : 1.4 (04/03/2010)     Complete Medication List: 1)  Aspirin 81 Mg Tbec (Aspirin) .... Take one tablet by mouth daily 2)  Carvedilol 25 Mg Tabs (Carvedilol) .... Take one tablet by mouth twice a day 3)  Furosemide 40 Mg Tabs (Furosemide) .... Take one tablet by mouth daily. 4)  Lisinopril 40 Mg Tabs (Lisinopril) .... Take  one tablet by mouth daily 5)  Hydralazine Hcl 25 Mg Tabs (Hydralazine hcl) .... Take one tablet by mouth three times a day 6)  Fexofenadine Hcl 180 Mg Tabs (Fexofenadine hcl) .... One by mouth once daily for nasal congestion  Colorectal Screening:  Current Recommendations:    Hemoccult: NEG X 1 today  PSA Screening:    Reviewed PSA screening recommendations: PSA ordered  Patient Instructions: 1)  Please schedule a follow-up appointment in 1 month. 2)  It is important that you exercise regularly at least 20 minutes 5 times a week. If you develop chest pain, have severe difficulty breathing, or feel very tired , stop exercising immediately and seek medical attention. 3)  You need to lose weight. Consider a lower calorie diet and regular exercise.  4)  Get plenty of rest, drink lots of clear liquids, and use Tylenol or Ibuprofen for fever and comfort. Return in 7-10 days if you're not better:sooner if you're feeling worse. 5)  Take 650-1000mg  of Tylenol every 4-6 hours as needed for relief of pain or comfort of fever AVOID taking more than 4000mg   in a 24 hour period (can cause liver damage in higher doses). 6)  Take 400-600mg  of Ibuprofen (Advil, Motrin) with food every 4-6 hours as needed for relief of pain or comfort of fever. Prescriptions: FEXOFENADINE HCL 180 MG TABS (FEXOFENADINE HCL) One by mouth once daily for nasal congestion  #30 x 11   Entered and Authorized by:   Etta Grandchild MD   Signed by:   Etta Grandchild MD on 06/17/2010   Method used:   [facility] to        [facility] [phone]* (retail)       [address]       [place], [place]  [address]       Ph: [phone]       Fax: [phone]   RxID:   [id number]    Orders Added: 1)  Venipuncture [02725] 2)  TLB-BMP (Basic Metabolic Panel-BMET) [80048-METABOL] 3)  TLB-CBC Platelet - w/Differential [85025-CBCD] 4)  TLB-Hepatic/Liver Function Pnl [80076-HEPATIC] 5)  TLB-TSH  (Thyroid Stimulating Hormone) [84443-TSH] 6)  TLB-Amylase [82150-AMYL] 7)  TLB-Lipase [83690-LIPASE] 8)  TLB-PSA (Prostate Specific Antigen) [84153-PSA] 9)  TLB-Udip w/ Micro [81001-URINE] 10)  New Patient Level IV [36644]

## 2010-06-26 NOTE — Progress Notes (Signed)
Summary: CALL - RX ?   Phone Note Call from Patient Call back at Home Phone [phone]   Summary of Call: Patient is requesting a call back regarding rx that he was expecting to be a pharmacy.  Initial call taken by: Lamar Sprinkles, CMA,  June 17, 2010 2:31 PM  Follow-up for Phone Call        Per pt - pharm did not get rx for congestion - resent, Pt informed  Follow-up by: Lamar Sprinkles, CMA,  June 17, 2010 4:21 PM    Prescriptions: FEXOFENADINE HCL 180 MG TABS (FEXOFENADINE HCL) One by mouth once daily for nasal congestion  #30 x 11   Entered by:   Lamar Sprinkles, CMA   Authorized by:   Etta Grandchild MD   Signed by:   Lamar Sprinkles, CMA on 06/17/2010   Method used:   [facility]ically to        [facility] [phone][facility] (retail)       [address]       [place], [place]  [address]       Ph: [phone]       Fax: [phone]   RxID:   [id number]

## 2010-07-01 ENCOUNTER — Ambulatory Visit (INDEPENDENT_AMBULATORY_CARE_PROVIDER_SITE_OTHER): Payer: Managed Care, Other (non HMO) | Admitting: Cardiology

## 2010-07-01 ENCOUNTER — Ambulatory Visit (HOSPITAL_COMMUNITY): Payer: Managed Care, Other (non HMO) | Attending: Cardiology

## 2010-07-01 ENCOUNTER — Encounter: Payer: Self-pay | Admitting: Cardiology

## 2010-07-01 DIAGNOSIS — I10 Essential (primary) hypertension: Secondary | ICD-10-CM

## 2010-07-01 DIAGNOSIS — I428 Other cardiomyopathies: Secondary | ICD-10-CM

## 2010-07-01 DIAGNOSIS — I080 Rheumatic disorders of both mitral and aortic valves: Secondary | ICD-10-CM | POA: Insufficient documentation

## 2010-07-01 DIAGNOSIS — I5022 Chronic systolic (congestive) heart failure: Secondary | ICD-10-CM

## 2010-07-01 DIAGNOSIS — I509 Heart failure, unspecified: Secondary | ICD-10-CM

## 2010-07-03 ENCOUNTER — Encounter: Payer: Self-pay | Admitting: Internal Medicine

## 2010-07-03 ENCOUNTER — Ambulatory Visit (INDEPENDENT_AMBULATORY_CARE_PROVIDER_SITE_OTHER): Payer: Managed Care, Other (non HMO) | Admitting: Internal Medicine

## 2010-07-03 DIAGNOSIS — I428 Other cardiomyopathies: Secondary | ICD-10-CM

## 2010-07-03 DIAGNOSIS — I5022 Chronic systolic (congestive) heart failure: Secondary | ICD-10-CM

## 2010-07-10 NOTE — Assessment & Plan Note (Signed)
Summary: new ep referral for cardiomyopathy/crenshaw/sl   Primary Provider:  Etta Grandchild MD  CC:  new patient.  .  History of Present Illness: [name] is a 50 year old gentleman that we are asked to see for consideration of ICD implantation.  He presented to hospital in the fall of 2011 with congestive heart failure. He was found at that time to have an ejection fraction of 10-20% by catheterization/echo. Was also found to have significant pulmonary hypertension; however, his wedge was 41. He had no peripheral edema.It was attributed to hypertension and alcohol. He has been sober since then. He has been on medical therapy for his congestive heart failure. He underwent repeat echo earlier this week demonstrating some modest improvement as an EF of 25-30%. His congestive symptoms currently are class II.  He has no history of syncope approximately was drinking heavily. He has had palpitations although this dates back some time.  He denies peripheral edema.      Current Medications (verified): 1)  Aspirin 81 Mg Tbec (Aspirin) .... Take One Tablet By Mouth Daily 2)  Carvedilol 25 Mg Tabs (Carvedilol) .... Take One Tablet By Mouth Twice A Day 3)  Furosemide 40 Mg Tabs (Furosemide) .... Take One Tablet By Mouth Daily. 4)  Lisinopril 40 Mg Tabs (Lisinopril) .... Take One Tablet By Mouth Daily 5)  Hydralazine Hcl 50 Mg Tabs (Hydralazine Hcl) .... Take One Tablet By Mouth Three Times A Day  Allergies (verified): No Known Drug Allergies  Past History:  Past Medical History: Last updated: 07/01/2010 Chronic Systolic congestive heart failure Non Ischemic Cardiomyopathy    a.  Echo 03/25/2010:  EF 20-25%; mild AI; Mod MR; Mild LAE    b.  Cardiac cath 03/26/2010:  normal cors; severe pulmonary HTN; PCWP 41; EF 10-15% h/o EtOH abuse. Hypertension  Past Surgical History: Last updated: 04/03/2010 Removal of a cyst from his left lung in 1994 at [facility].  Status post L5-S1 fusion.       Family History: Last updated: 04/02/2010  Significant for diabetes and hypertension in both   parents and multiple siblings.   Social History: Last updated: 06/17/2010 Smoker . . .quit 02/2010 + alcohol abuse . . . quit 02/2010 Denies illicit drug use.   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   Vital Signs:  Patient profile:   50 year old male Height:      66 inches Weight:      190 pounds Pulse rate:   64 / minute Pulse rhythm:   regular BP sitting:   144 / 104  (left arm)  Vitals Entered By: Judithe Modest CMA (July 03, 2010 3:56 PM)  Physical Exam  General:  Well developed, well nourished,asking American male appearing his stated age in no acute distress. Head:  normal HEENT apart from poor dentition Neck:  flat neck veins; carotids diminished but full. Supple without thyromegaly Chest Wall:  no deformities or breast masses noted; no CVA tenderness Lungs:  Clear bilaterally to auscultation and percussion. Heart:  regular rate and rhythm with a diminished S1 no significant murmurs an S4 is displaced and sustained PMI Abdomen:  soft nontender without hepatomegaly a midline pulsation negative HJR Msk:  Back normal, normal gait. Muscle strength and tone normal. Pulses:  pulses normal in all  4 extremities Extremities:  No clubbing or cyanosis.there is no peripheral edema Neurologic:  Alert and oriented x 3.grossly normal motor and sensory function Skin:  Intact without lesions or rashes. Cervical Nodes:  no significant adenopathy Psych:  Normal affect.   Impression & Recommendations:  Problem # 1:  CARDIOMYOPATHY (ICD-425.4) the patient has a nonischemic cardiomyopathy. He has had some interval improvement with abstention from alcohol and initiation of medical therapy. His blood  pressure is quite elevated today. He is gone 3 months and still has all fine ejection fraction for ICD, I thought that we might try another month or so of medical therapy with the addition of nitrates to complement his hydralazine and the addition of Aldactone. I reviewed with him the potential side effects of the latter drug. We'll plan to check his metabolic profile in 2 weeks time and it will get an echo in 4 weeks time to reassess his ejection fraction.  We discussed the potential role of an ICD. His updated medication list for this problem includes:    Aspirin 81 Mg Tbec (Aspirin) .Marland Kitchen... Take one tablet by mouth daily    Carvedilol 25 Mg Tabs (Carvedilol) .Marland Kitchen... Take one tablet by mouth twice a day    Furosemide 40 Mg Tabs (Furosemide) .Marland Kitchen... Take one tablet by mouth daily.    Lisinopril 40 Mg Tabs (Lisinopril) .Marland Kitchen... Take one tablet by mouth daily  Problem # 2:  CHRONIC SYSTOLIC HEART FAILURE (ICD-428.22) as above His updated medication list for this problem includes:    Aspirin 81 Mg Tbec (Aspirin) .Marland Kitchen... Take one tablet by mouth daily    Carvedilol 25 Mg Tabs (Carvedilol) .Marland Kitchen... Take one tablet by mouth twice a day    Furosemide 40 Mg Tabs (Furosemide) .Marland Kitchen... Take one tablet by mouth daily.    Lisinopril 40 Mg Tabs (Lisinopril) .Marland Kitchen... Take one tablet by mouth daily  Problem # 3:  ALCOHOL ABUSE (ICD-305.00) he says that he is abstaining  Problem # 4:  HYPERTENSION (ICD-401.9) his blood pressure remains quite elevated. We will add the indoor and the spironolactone which may help reduce it. His updated medication list for this problem includes:    Aspirin 81 Mg Tbec (Aspirin) .Marland Kitchen... Take one tablet by mouth daily    Carvedilol 25 Mg Tabs (Carvedilol) .Marland Kitchen... Take one tablet by mouth twice a day    Furosemide 40 Mg Tabs (Furosemide) .Marland Kitchen... Take one tablet by mouth daily.    Lisinopril 40 Mg Tabs (Lisinopril) .Marland Kitchen... Take one tablet by mouth daily    Hydralazine Hcl 50 Mg Tabs (Hydralazine hcl) .Marland Kitchen...  Take one tablet by mouth three times a day  Appended Document: [facility]     Allergies: No Known Drug Allergies   Other Orders: Echocardiogram (Echo) EKG w/ Interpretation (93000)  Patient Instructions: 1)  Your physician has recommended you make the following change in your medication: START Isosorbide MN 30mg  once a day, START Spironolactone 25mg  once a day 2)  Your physician recommends that you return for lab work in: 2 WEEKS (BMP 401.9, 425.4, 428.22) 3)  Your physician recommends that you schedule a follow-up appointment in: 1 MONTH 4)  Your physician has requested that you have an echocardiogram in 1 MONTH.  Echocardiography is a painless test that uses sound waves to create images of your heart. It provides your doctor with information about the size and shape of your heart and how well your heart's chambers and valves are working.  This procedure takes approximately one hour.  There are no restrictions for this procedure. Prescriptions: SPIRONOLACTONE 25 MG TABS (SPIRONOLACTONE) Take one tablet by mouth daily  #30 x 6   Entered by:   Julieta Gutting, RN, BSN   Authorized by:   Nathen May, MD, [facility]   Signed by:   Julieta Gutting, RN, BSN on 07/03/2010   Method used:   Electronically to        [facility] [phone]* (retail)       [address]       [place], [place]  [address]       Ph: [phone]       Fax: [phone]   RxID:   [id number] ISOSORBIDE MONONITRATE CR 30 MG XR24H-TAB (ISOSORBIDE MONONITRATE) Take one tablet by mouth daily  #30 x 6   Entered by:   Julieta Gutting, RN, BSN   Authorized by:   Nathen May, MD, [facility]   Signed by:   Julieta Gutting, RN, BSN on 07/03/2010   Method used:   Electronically to        [facility] [phone]* (retail)       [address]       [place], [place]  [address]       Ph: [phone]       Fax: [phone]   RxID:   [id number]

## 2010-07-10 NOTE — Assessment & Plan Note (Signed)
Summary: per check out/pt is having echo 2pm/saf  rs from bump list-mb   Primary Provider:  Etta Grandchild MD  CC:  follwo up.  History of Present Illness: 50 year old male recently admitted to [facility] in November of 2011 with congestive heart failure. An echocardiogram showed an ejection fraction of 20-25% and moderate mitral regurgitation. There was mild aortic insufficiency and mild left atrial enlargement. Cardiac catheterization revealed an ejection fraction of 10-15% and normal coronary arteries. His left ventricular end-diastolic pressure was 35 and his pulmonary pressures were severely elevated. The patient was treated medically with improvement in his symptoms. Note his cardiomyopathy was felt most likely secondary to hypertension or alcohol. I last saw him in December of 2011. Since then, the patient denies any dyspnea on exertion, orthopnea, PND, pedal edema, palpitations, syncope or chest pain.   Current Medications (verified): 1)  Aspirin 81 Mg Tbec (Aspirin) .... Take One Tablet By Mouth Daily 2)  Carvedilol 25 Mg Tabs (Carvedilol) .... Take One Tablet By Mouth Twice A Day 3)  Furosemide 40 Mg Tabs (Furosemide) .... Take One Tablet By Mouth Daily. 4)  Lisinopril 40 Mg Tabs (Lisinopril) .... Take One Tablet By Mouth Daily 5)  Hydralazine Hcl 25 Mg Tabs (Hydralazine Hcl) .... Take One Tablet By Mouth Three Times A Day 6)  Fexofenadine Hcl 180 Mg Tabs (Fexofenadine Hcl) .... One By Mouth Once Daily For Nasal Congestion  Allergies: No Known Drug Allergies  Past History:  Past Medical History: Chronic Systolic congestive heart failure Non Ischemic Cardiomyopathy    a.  Echo 03/25/2010:  EF 20-25%; mild AI; Mod MR; Mild LAE    b.  Cardiac cath 03/26/2010:  normal cors; severe pulmonary HTN; PCWP 41; EF 10-15% h/o EtOH abuse. Hypertension  Past Surgical History: Reviewed history from 04/03/2010 and no changes required. Removal of a cyst from his left lung in 1994  at [facility].  Status post L5-S1 fusion.      Social History: Reviewed history from 06/17/2010 and no changes required. Smoker . . .quit 02/2010 + alcohol abuse . . . quit 02/2010 Denies illicit drug use.   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 Drug use-no Regular exercise-no  Review of Systems       no fevers or chills, productive cough, hemoptysis, dysphasia, odynophagia, melena, hematochezia, dysuria, hematuria, rash, seizure activity, orthopnea, PND, pedal edema, claudication. Remaining systems are negative.   Vital Signs:  Patient profile:   51 year old male Height:      66 inches Weight:      193 pounds BMI:     31.26 Pulse rate:   58 / minute Resp:     16 per minute BP sitting:   177 / 101  (left arm)  Vitals Entered By: Kem Parkinson (July 01, 2010 3:25 PM)  Physical Exam  General:  Well-developed well-nourished in no acute distress.  Skin is warm and dry.  HEENT is normal.  Neck is supple. No thyromegaly.  Chest is clear to auscultation with normal expansion.  Cardiovascular exam is regular rate and rhythm.  Abdominal exam nontender or distended. No masses palpated. Extremities show no edema. neuro grossly intact    Impression & Recommendations:  Problem # 1:  CARDIOMYOPATHY (ICD-425.4) Continue ACE inhibitor and beta blocker. Cardiomyopathy felt secondary to hypertension. Echo repeated today. If ejection fraction 35% or less we will refer to EP for consideration of ICD. His updated medication list for this problem includes:  Aspirin 81 Mg Tbec (Aspirin) .Marland Kitchen... Take one tablet by mouth daily    Carvedilol 25 Mg Tabs (Carvedilol) .Marland Kitchen... Take one tablet by mouth twice a day    Furosemide 40 Mg Tabs (Furosemide) .Marland Kitchen... Take one tablet by mouth daily.    Lisinopril 40 Mg Tabs (Lisinopril) .Marland Kitchen... Take one tablet by mouth daily  His updated medication list for this problem includes:    Aspirin 81 Mg Tbec (Aspirin) .Marland Kitchen... Take one tablet  by mouth daily    Carvedilol 25 Mg Tabs (Carvedilol) .Marland Kitchen... Take one tablet by mouth twice a day    Furosemide 40 Mg Tabs (Furosemide) .Marland Kitchen... Take one tablet by mouth daily.    Lisinopril 40 Mg Tabs (Lisinopril) .Marland Kitchen... Take one tablet by mouth daily  Problem # 2:  TOBACCO ABUSE (ICD-305.1) Now resolved.  Problem # 3:  HYPERTENSION (ICD-401.9) Patient is following his blood pressure at home. His diastolic is running approximately 90. Increase hydralazine to 50 mg p.o. t.i.d. His updated medication list for this problem includes:    Aspirin 81 Mg Tbec (Aspirin) .Marland Kitchen... Take one tablet by mouth daily    Carvedilol 25 Mg Tabs (Carvedilol) .Marland Kitchen... Take one tablet by mouth twice a day    Furosemide 40 Mg Tabs (Furosemide) .Marland Kitchen... Take one tablet by mouth daily.    Lisinopril 40 Mg Tabs (Lisinopril) .Marland Kitchen... Take one tablet by mouth daily    Hydralazine Hcl 50 Mg Tabs (Hydralazine hcl) .Marland Kitchen... Take one tablet by mouth three times a day  Patient Instructions: 1)  Your physician recommends that you schedule a follow-up appointment in: 3 MONTHS 2)  Your physician has recommended you make the following change in your medication: INCREASE HYDRALAZINE 50MG  (TAKE TWO 25MG  TABLETS THREE TIMES DAILY UNTIL GONE) ONE TABLET THREE TIMES DAILY Prescriptions: HYDRALAZINE HCL 50 MG TABS (HYDRALAZINE HCL) Take one tablet by mouth three times a day  #90 x 12   Entered by:   Deliah Goody, RN   Authorized by:   Ferman Hamming, MD, [facility]   Signed by:   Deliah Goody, RN on 07/01/2010   Method used:   Electronically to        [facility] [phone]* (retail)       [address]       [place], [place]  [address]       Ph: [phone]       Fax: [phone]   RxID:   [id number]

## 2010-07-15 ENCOUNTER — Other Ambulatory Visit: Payer: Self-pay | Admitting: Internal Medicine

## 2010-07-15 ENCOUNTER — Encounter: Payer: Self-pay | Admitting: Internal Medicine

## 2010-07-15 ENCOUNTER — Other Ambulatory Visit (INDEPENDENT_AMBULATORY_CARE_PROVIDER_SITE_OTHER): Payer: Managed Care, Other (non HMO)

## 2010-07-15 DIAGNOSIS — I10 Essential (primary) hypertension: Secondary | ICD-10-CM

## 2010-07-15 DIAGNOSIS — I5022 Chronic systolic (congestive) heart failure: Secondary | ICD-10-CM

## 2010-07-15 LAB — BASIC METABOLIC PANEL
BUN: 17 mg/dL (ref 6–23)
CO2: 28 mEq/L (ref 19–32)
Calcium: 9.4 mg/dL (ref 8.4–10.5)
Chloride: 108 mEq/L (ref 96–112)
Creatinine, Ser: 1.1 mg/dL (ref 0.4–1.5)
GFR: 87.53 mL/min (ref >60.00)
Glucose, Bld: 103 mg/dL — ABNORMAL HIGH (ref 70–99)
Potassium: 4.3 mEq/L (ref 3.5–5.1)
Sodium: 142 mEq/L (ref 135–145)

## 2010-07-18 ENCOUNTER — Ambulatory Visit (INDEPENDENT_AMBULATORY_CARE_PROVIDER_SITE_OTHER)
Admission: RE | Admit: 2010-07-18 | Discharge: 2010-07-18 | Disposition: A | Discharge status: Home / Self Care | Payer: Managed Care, Other (non HMO) | Source: Ambulatory Visit | Attending: Internal Medicine | Admitting: Internal Medicine

## 2010-07-18 ENCOUNTER — Encounter: Payer: Self-pay | Admitting: Internal Medicine

## 2010-07-18 ENCOUNTER — Ambulatory Visit (INDEPENDENT_AMBULATORY_CARE_PROVIDER_SITE_OTHER): Payer: Managed Care, Other (non HMO) | Admitting: Internal Medicine

## 2010-07-18 ENCOUNTER — Other Ambulatory Visit: Payer: Self-pay | Admitting: Internal Medicine

## 2010-07-18 DIAGNOSIS — M25559 Pain in unspecified hip: Secondary | ICD-10-CM

## 2010-07-18 DIAGNOSIS — I10 Essential (primary) hypertension: Secondary | ICD-10-CM

## 2010-07-19 ENCOUNTER — Encounter (INDEPENDENT_AMBULATORY_CARE_PROVIDER_SITE_OTHER): Payer: Self-pay | Admitting: *Deleted

## 2010-07-22 ENCOUNTER — Ambulatory Visit: Payer: Self-pay | Admitting: Internal Medicine

## 2010-07-23 ENCOUNTER — Telehealth: Payer: Self-pay | Admitting: Internal Medicine

## 2010-07-25 NOTE — Assessment & Plan Note (Signed)
Summary: 1 mo fu /nws   Vital Signs:  Patient profile:   50 year old male Height:      66 inches Weight:      192.50 pounds BMI:     31.18 O2 Sat:      96 % on Room air Temp:     98.2 degrees F oral Pulse rate:   78 / minute Pulse rhythm:   regular Resp:     16 per minute BP sitting:   106 / 68  (left arm) Cuff size:   large  Vitals Entered By: Rock Nephew CMA (July 18, 2010 2:02 PM)  Nutrition Counseling: Patient's BMI is greater than 25 and therefore counseled on weight management options.  O2 Flow:  Room air CC: Patient c/o back pain Is Patient Diabetic? No Pain Assessment Patient in pain? yes     Location: hip Intensity: 3 Type: sharp   Primary Care Provider:  Etta Grandchild MD  CC:  Patient c/o back pain.  History of Present Illness: He returns for f/up and he now localizes his pain to the right hip, posteriorly, and he says the pain "shoots" down his right leg. He does not have any low back pain.  Preventive Screening-Counseling & Management  Alcohol-Tobacco     Alcohol drinks/day: 0     Alcohol Counseling: not indicated; patient does not drink     Smoking Status: quit < 6 months     Year Quit: 2011     Tobacco Counseling: to remain off tobacco products  Hep-HIV-STD-Contraception     Hepatitis Risk: no risk noted     HIV Risk: no risk noted     STD Risk: no risk noted      Sexual History:  currently monogamous.        Drug Use:  no.        Blood Transfusions:  no.    Clinical Review Panels:  Prevention   Last PSA:  0.91 (06/17/2010)  Diabetes Management   Creatinine:  1.1 (07/15/2010)  CBC   WBC:  4.6 (06/17/2010)   RBC:  3.89 (06/17/2010)   Hgb:  12.0 (06/17/2010)   Hct:  35.9 (06/17/2010)   Platelets:  298.0 (06/17/2010)   MCV  92.3 (06/17/2010)   MCHC  33.5 (06/17/2010)   RDW  13.4 (06/17/2010)   PMN:  56.2 (06/17/2010)   Lymphs:  35.6 (06/17/2010)   Monos:  5.6 (06/17/2010)   Eosinophils:  2.2 (06/17/2010)   Basophil:   0.4 (06/17/2010)  Complete Metabolic Panel   Glucose:  103 (07/15/2010)   Sodium:  142 (07/15/2010)   Potassium:  4.3 (07/15/2010)   Chloride:  108 (07/15/2010)   CO2:  28 (07/15/2010)   BUN:  17 (07/15/2010)   Creatinine:  1.1 (07/15/2010)   Albumin:  4.2 (06/17/2010)   Total Protein:  7.6 (06/17/2010)   Calcium:  9.4 (07/15/2010)   Total Bili:  0.8 (06/17/2010)   Alk Phos:  62 (06/17/2010)   SGPT (ALT):  35 (06/17/2010)   SGOT (AST):  25 (06/17/2010)   Medications Prior to Update: 1)  Aspirin 81 Mg Tbec (Aspirin) .... Take One Tablet By Mouth Daily 2)  Carvedilol 25 Mg Tabs (Carvedilol) .... Take One Tablet By Mouth Twice A Day 3)  Furosemide 40 Mg Tabs (Furosemide) .... Take One Tablet By Mouth Daily. 4)  Lisinopril 40 Mg Tabs (Lisinopril) .... Take One Tablet By Mouth Daily 5)  Hydralazine Hcl 50 Mg Tabs (Hydralazine Hcl) .... Take One  Tablet By Mouth Three Times A Day 6)  Isosorbide Mononitrate Cr 30 Mg Xr24h-Tab (Isosorbide Mononitrate) .... Take One Tablet By Mouth Daily 7)  Spironolactone 25 Mg Tabs (Spironolactone) .... Take One Tablet By Mouth Daily  Current Medications (verified): 1)  Aspirin 81 Mg Tbec (Aspirin) .... Take One Tablet By Mouth Daily 2)  Carvedilol 25 Mg Tabs (Carvedilol) .... Take One Tablet By Mouth Twice A Day 3)  Furosemide 40 Mg Tabs (Furosemide) .... Take One Tablet By Mouth Daily. 4)  Lisinopril 40 Mg Tabs (Lisinopril) .... Take One Tablet By Mouth Daily 5)  Hydralazine Hcl 50 Mg Tabs (Hydralazine Hcl) .... Take One Tablet By Mouth Three Times A Day 6)  Isosorbide Mononitrate Cr 30 Mg Xr24h-Tab (Isosorbide Mononitrate) .... Take One Tablet By Mouth Daily 7)  Spironolactone 25 Mg Tabs (Spironolactone) .... Take One Tablet By Mouth Daily 8)  Nucynta Er 50 Mg Xr12h-Tab (Tapentadol Hcl) .... One By Mouth Two Times A Day As Needed For Pain  Allergies (verified): No Known Drug Allergies  Past History:  Past Medical History: Last updated:  07/01/2010 Chronic Systolic congestive heart failure Non Ischemic Cardiomyopathy    a.  Echo 03/25/2010:  EF 20-25%; mild AI; Mod MR; Mild LAE    b.  Cardiac cath 03/26/2010:  normal cors; severe pulmonary HTN; PCWP 41; EF 10-15% h/o EtOH abuse. Hypertension  Past Surgical History: Last updated: 04/03/2010 Removal of a cyst from his left lung in 1994 at [facility].  Status post L5-S1 fusion.      Family History: Last updated: 04/02/2010  Significant for diabetes and hypertension in both   parents and multiple siblings.   Social History: Last updated: 06/17/2010 Smoker . . .quit 02/2010 + alcohol abuse . . . quit 02/2010 Denies illicit drug use.   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 Drug use-no Regular exercise-no  Risk Factors: Alcohol Use: 0 (07/18/2010) Exercise: no (06/17/2010)  Risk Factors: Smoking Status: quit < 6 months (07/18/2010)  Family History: Reviewed history from 04/02/2010 and no changes required.  Significant for diabetes and hypertension in both   parents and multiple siblings.   Social History: Reviewed history from 06/17/2010 and no changes required. Smoker . . .quit 02/2010 + alcohol abuse . . . quit 02/2010 Denies illicit drug use.   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   MS:  Complains of joint pain; denies joint redness, joint swelling, loss of strength, low back pain, mid back pain, muscle aches, muscle weakness, and stiffness.  Physical Exam  General:  alert, well-developed, well-nourished, well-hydrated, appropriate dress, normal appearance, healthy-appearing, cooperative to examination, good hygiene, and overweight-appearing.   Head:  normocephalic, atraumatic, no  abnormalities observed, and no abnormalities palpated.   Mouth:  good dentition, pharynx pink and moist, no erythema, no exudates, no posterior lymphoid hypertrophy, no postnasal drip, no pharyngeal crowing, no lesions, no aphthous ulcers, no erosions, no tongue abnormalities, no leukoplakia, and no petechiae.   Neck:  supple, full ROM, no masses, no thyromegaly, no thyroid nodules or tenderness, no JVD, no HJR, normal carotid upstroke, no carotid bruits, no cervical lymphadenopathy, and no neck tenderness.   Lungs:  normal respiratory effort, no intercostal retractions, no accessory muscle use, normal breath sounds, no  dullness, no fremitus, no crackles, and no wheezes.   Heart:  normal rate, regular rhythm, no murmur, no gallop, no rub, and no JVD.   Abdomen:  soft, non-tender, normal bowel sounds, no distention, no masses, no guarding, no rigidity, no rebound tenderness, no abdominal hernia, no inguinal hernia, no hepatomegaly, and no splenomegaly.   Msk:  No deformity or scoliosis noted of thoracic or lumbar spine.   Pulses:  R and L carotid,radial,femoral,dorsalis pedis and posterior tibial pulses are full and equal bilaterally Extremities:  No clubbing, cyanosis, edema, or deformity noted with normal full range of motion of all joints.   Neurologic:  No cranial nerve deficits noted. Station and gait are normal. Plantar reflexes are down-going bilaterally. DTRs are symmetrical throughout. Sensory, motor and coordinative functions appear intact. Skin:  Intact without suspicious lesions or rashes Cervical Nodes:  No lymphadenopathy noted Axillary Nodes:  No palpable lymphadenopathy Psych:  Cognition and judgment appear intact. Alert and cooperative with normal attention span and concentration. No apparent delusions, illusions, hallucinations   Hip Exam  General:    Well-developed,well-nourished,normal body habitus;no deformities, normal grooming.  Gait:    Normal heel-toe gait pattern  bilaterally.    Hip Exam:    Right:    Inspection:  Normal    Palpation:  Normal    Stability:  stable    Tenderness:  no    Swelling:  no    Erythema:  no    Range of Motion:       Flexion-Active: 120       Extension-Active: 30       Internal Rotation-Active: 45       External Rotation-Active: 45       Flexion-Passive: 120       Extension-Passive: 30       Internal Rotation-Passive: 45       External Rotation: 45    Left:    Inspection:  Normal    Palpation:  Normal    Stability:  stable    Tenderness:  no    Swelling:  no    Erythema:  no    Range of Motion:       Flexion-Active: 120       Extension-Active: 30       Internal Rotation-Active: 45       External Rotation-Active: 45       Flexion-Passive: 120       Extension-Passive: 30       Internal Rotation-Passive: 45       External Rotation: 45   Impression & Recommendations:  Problem # 1:  HIP PAIN, RIGHT (ICD-719.45) Assessment New  will check plain films today for DJD His updated medication list for this problem includes:    Aspirin 81 Mg Tbec (Aspirin) .Marland Kitchen... Take one tablet by mouth daily    Nucynta Er 50 Mg Xr12h-tab (Tapentadol hcl) ..... One by mouth two times a day as needed for pain  Orders: T-Hip Comp Right Min 2 views (73510TC)  Discussed use of medications, application of heat or cold, and exercises.   Problem # 2:  HYPERTENSION (ICD-401.9) Assessment: Improved  His updated medication list for this problem includes:    Carvedilol 25 Mg Tabs (Carvedilol) .Marland Kitchen... Take one tablet by mouth twice a day    Furosemide 40 Mg Tabs (Furosemide) .Marland Kitchen... Take one tablet by mouth daily.    Lisinopril 40 Mg Tabs (Lisinopril) .Marland Kitchen... Take one tablet by mouth daily    Hydralazine Hcl 50 Mg Tabs (  Hydralazine hcl) .Marland Kitchen... Take one tablet by mouth three times a day    Spironolactone 25 Mg Tabs (Spironolactone) .Marland Kitchen... Take one tablet by mouth daily  BP today: 106/68 Prior BP: 144/104 (07/03/2010)  Labs  Reviewed: K+: 4.3 (07/15/2010) Creat: : 1.1 (07/15/2010)     Complete Medication List: 1)  Aspirin 81 Mg Tbec (Aspirin) .... Take one tablet by mouth daily 2)  Carvedilol 25 Mg Tabs (Carvedilol) .... Take one tablet by mouth twice a day 3)  Furosemide 40 Mg Tabs (Furosemide) .... Take one tablet by mouth daily. 4)  Lisinopril 40 Mg Tabs (Lisinopril) .... Take one tablet by mouth daily 5)  Hydralazine Hcl 50 Mg Tabs (Hydralazine hcl) .... Take one tablet by mouth three times a day 6)  Isosorbide Mononitrate Cr 30 Mg Xr24h-tab (Isosorbide mononitrate) .... Take one tablet by mouth daily 7)  Spironolactone 25 Mg Tabs (Spironolactone) .... Take one tablet by mouth daily 8)  Nucynta Er 50 Mg Xr12h-tab (Tapentadol hcl) .... One by mouth two times a day as needed for pain  Patient Instructions: 1)  Please schedule a follow-up appointment in 1 month. 2)  Take 650-1000mg  of Tylenol every 4-6 hours as needed for relief of pain or comfort of fever AVOID taking more than 4000mg   in a 24 hour period (can cause liver damage in higher doses). 3)  Take 400-600mg  of Ibuprofen (Advil, Motrin) with food every 4-6 hours as needed for relief of pain or comfort of fever. 4)  Most patients (90%) with low back pain will improve with time (2-6 weeks). Keep active but avoid activities that are painful. Apply moist heat and/or ice to lower back several times a day. Prescriptions: NUCYNTA ER 50 MG XR12H-TAB (TAPENTADOL HCL) One by mouth two times a day as needed for pain  #60 x 0   Entered and Authorized by:   Etta Grandchild MD   Signed by:   Etta Grandchild MD on 07/18/2010   Method used:   Print then Give to Patient   RxID:   [id number]    Orders Added: 1)  T-Hip Comp Right Min 2 views [73510TC] 2)  Est. Patient Level IV [64332]

## 2010-07-25 NOTE — Letter (Signed)
Summary: Appointment - Reschedule  [facility], Main Office  [address]   [place], [place] [address]   Phone: [phone]  Fax: [phone]     July 19, 2010 MRN: [id number]   Mike Orozco [address] [place], [place]  [address]   Dear Mr. [name],   Due to a change in our office schedule, your appointment on 08-19-2010                       at   11:30 a.m.  must be changed.  It is very important that we reach you to reschedule this appointment. We look forward to participating in your health care needs. Please contact us at the number listed above at your earliest convenience to reschedule this appointment.     Sincerely,      Lorne Skeens [facility] Scheduling Team

## 2010-07-30 LAB — BASIC METABOLIC PANEL
BUN: 12 mg/dL (ref 6–23)
BUN: 14 mg/dL (ref 6–23)
Chloride: 107 mEq/L (ref 96–112)
Creatinine, Ser: 1.36 mg/dL (ref 0.4–1.5)
GFR calc Af Amer: >60 mL/min (ref >60)
GFR calc non Af Amer: 56 mL/min — ABNORMAL LOW (ref >60)
GFR calc non Af Amer: >60 mL/min (ref >60)
Potassium: 3.7 mEq/L (ref 3.5–5.1)
Sodium: 139 mEq/L (ref 135–145)

## 2010-07-30 LAB — POCT I-STAT 3, VENOUS BLOOD GAS (G3P V)
Acid-base deficit: 1 mmol/L (ref 0.0–2.0)
O2 Saturation: 49 %
TCO2: 25 mmol/L (ref 0–100)
pCO2, Ven: 40.1 mmHg — ABNORMAL LOW (ref 45.0–50.0)

## 2010-07-30 LAB — POCT CARDIAC MARKERS
CKMB, poc: 2.6 ng/mL (ref 1.0–8.0)
Myoglobin, poc: 96.8 ng/mL (ref 12–200)
Troponin i, poc: <0.05 ng/mL (ref 0.00–0.09)

## 2010-07-30 LAB — COMPREHENSIVE METABOLIC PANEL
BUN: 14 mg/dL (ref 6–23)
CO2: 27 mEq/L (ref 19–32)
Calcium: 9.3 mg/dL (ref 8.4–10.5)
Creatinine, Ser: 1.31 mg/dL (ref 0.4–1.5)
GFR calc non Af Amer: 58 mL/min — ABNORMAL LOW (ref >60)
Glucose, Bld: 161 mg/dL — ABNORMAL HIGH (ref 70–99)
Total Protein: 7.1 g/dL (ref 6.0–8.3)

## 2010-07-30 LAB — CBC
HCT: 37.5 % — ABNORMAL LOW (ref 39.0–52.0)
HCT: 38.5 % — ABNORMAL LOW (ref 39.0–52.0)
HCT: 39.1 % (ref 39.0–52.0)
Hemoglobin: 12.2 g/dL — ABNORMAL LOW (ref 13.0–17.0)
Hemoglobin: 12.9 g/dL — ABNORMAL LOW (ref 13.0–17.0)
Hemoglobin: 12.9 g/dL — ABNORMAL LOW (ref 13.0–17.0)
MCH: 30.4 pg (ref 26.0–34.0)
MCH: 30.7 pg (ref 26.0–34.0)
MCHC: 33 g/dL (ref 30.0–36.0)
MCHC: 33.5 g/dL (ref 30.0–36.0)
MCV: 91.7 fL (ref 78.0–100.0)
Platelets: 238 10*3/uL (ref 150–400)
Platelets: 260 10*3/uL (ref 150–400)
RBC: 4.05 MIL/uL — ABNORMAL LOW (ref 4.22–5.81)
RBC: 4.2 MIL/uL — ABNORMAL LOW (ref 4.22–5.81)
RDW: 12.3 % (ref 11.5–15.5)
RDW: 12.5 % (ref 11.5–15.5)
RDW: 12.5 % (ref 11.5–15.5)
RDW: 12.5 % (ref 11.5–15.5)
WBC: 4.5 10*3/uL (ref 4.0–10.5)
WBC: 4.9 10*3/uL (ref 4.0–10.5)
WBC: 5.6 10*3/uL (ref 4.0–10.5)

## 2010-07-30 LAB — DIFFERENTIAL
Basophils Absolute: 0 10*3/uL (ref 0.0–0.1)
Basophils Relative: 0 % (ref 0–1)
Eosinophils Absolute: 0.1 10*3/uL (ref 0.0–0.7)
Eosinophils Relative: 1 % (ref 0–5)
Lymphocytes Relative: 30 % (ref 12–46)
Lymphs Abs: 1.7 10*3/uL (ref 0.7–4.0)
Monocytes Absolute: 0.4 10*3/uL (ref 0.1–1.0)
Monocytes Relative: 8 % (ref 3–12)
Neutro Abs: 3.4 10*3/uL (ref 1.7–7.7)
Neutrophils Relative %: 61 % (ref 43–77)

## 2010-07-30 LAB — POCT I-STAT, CHEM 8
Chloride: 109 mEq/L (ref 96–112)
Creatinine, Ser: 1.2 mg/dL (ref 0.4–1.5)
Glucose, Bld: 104 mg/dL — ABNORMAL HIGH (ref 70–99)
HCT: 43 % (ref 39.0–52.0)
Hemoglobin: 14.6 g/dL (ref 13.0–17.0)
Potassium: 3.6 mEq/L (ref 3.5–5.1)
Sodium: 142 mEq/L (ref 135–145)

## 2010-07-30 LAB — POCT I-STAT 3, ART BLOOD GAS (G3+)
Acid-base deficit: 1 mmol/L (ref 0.0–2.0)
Bicarbonate: 22.7 mEq/L (ref 20.0–24.0)
TCO2: 24 mmol/L (ref 0–100)

## 2010-07-30 LAB — TSH: TSH: 1.572 u[IU]/mL (ref 0.350–4.500)

## 2010-07-30 LAB — LIPID PANEL
HDL: 43 mg/dL (ref >39)
LDL Cholesterol: 98 mg/dL (ref 0–99)
Triglycerides: 155 mg/dL — ABNORMAL HIGH (ref <150)
VLDL: 31 mg/dL (ref 0–40)

## 2010-07-30 LAB — MAGNESIUM: Magnesium: 1.9 mg/dL (ref 1.5–2.5)

## 2010-07-30 NOTE — Progress Notes (Signed)
Summary: RESULTS   Phone Note Call from Patient Call back at [phone]   Summary of Call: Patient is requesting results of xray. He continues to c/o back pain.  Initial call taken by: Lamar Sprinkles, CMA,  July 23, 2010 4:56 PM  Follow-up for Phone Call        hip xray was normal, he told me that he had hip pain, he needs to be seen again Follow-up by: Etta Grandchild MD,  July 23, 2010 5:26 PM  Additional Follow-up for Phone Call Additional follow up Details #1::        Pt continues to c/o the same pain as when he was at last office visit. Pt states he did not know to try tylenol or advil. He will also try advil as directed. He will call office back w/any increase/change in symptoms.  FYI - pt had surgery on L-5, "screws in back" and asked if he should see his back MD. Surgery was 2002 - I advised he should come back in for re-eval if symptoms changed.  Additional Follow-up by: Lamar Sprinkles, CMA,  July 23, 2010 5:38 PM

## 2010-08-14 ENCOUNTER — Telehealth: Payer: Self-pay | Admitting: *Deleted

## 2010-08-14 NOTE — Telephone Encounter (Signed)
Patient requesting recommendation from MD for OTC allergy med that is ok to try?

## 2010-08-15 NOTE — Telephone Encounter (Signed)
zyrtec

## 2010-08-15 NOTE — Telephone Encounter (Signed)
Patient informed. 

## 2010-08-19 ENCOUNTER — Other Ambulatory Visit (HOSPITAL_COMMUNITY): Payer: Self-pay | Admitting: Radiology

## 2010-08-19 ENCOUNTER — Ambulatory Visit (INDEPENDENT_AMBULATORY_CARE_PROVIDER_SITE_OTHER): Payer: Managed Care, Other (non HMO) | Admitting: Internal Medicine

## 2010-08-19 ENCOUNTER — Ambulatory Visit: Payer: Managed Care, Other (non HMO) | Admitting: Internal Medicine

## 2010-08-19 ENCOUNTER — Other Ambulatory Visit (HOSPITAL_COMMUNITY): Payer: Managed Care, Other (non HMO) | Admitting: Radiology

## 2010-08-19 ENCOUNTER — Encounter: Payer: Self-pay | Admitting: Internal Medicine

## 2010-08-19 ENCOUNTER — Ambulatory Visit (INDEPENDENT_AMBULATORY_CARE_PROVIDER_SITE_OTHER)
Admission: RE | Admit: 2010-08-19 | Discharge: 2010-08-19 | Disposition: A | Discharge status: Home / Self Care | Payer: Managed Care, Other (non HMO) | Source: Ambulatory Visit | Attending: Internal Medicine | Admitting: Internal Medicine

## 2010-08-19 VITALS — BP 118/74 | HR 62 | Temp 97.9°F | Ht 66.0 in | Wt 194.0 lb

## 2010-08-19 DIAGNOSIS — M545 Low back pain, unspecified: Secondary | ICD-10-CM

## 2010-08-19 DIAGNOSIS — I428 Other cardiomyopathies: Secondary | ICD-10-CM

## 2010-08-19 DIAGNOSIS — I5022 Chronic systolic (congestive) heart failure: Secondary | ICD-10-CM

## 2010-08-19 DIAGNOSIS — I10 Essential (primary) hypertension: Secondary | ICD-10-CM

## 2010-08-19 MED ORDER — PRAVASTATIN SODIUM 40 MG PO TABS: 40.0000 mg | ORAL_TABLET | Freq: Every evening | ORAL | Status: DC

## 2010-08-19 MED ORDER — TAPENTADOL HCL ER 50 MG PO TB12: 1.0000 | ORAL_TABLET | Freq: Two times a day (BID) | ORAL | Status: DC | PRN

## 2010-08-19 NOTE — Assessment & Plan Note (Signed)
I think he should be on a statin to reduce the risk of MI/CVA so I have written an Rx for pravastatin

## 2010-08-19 NOTE — Assessment & Plan Note (Signed)
His BP is well controlled 

## 2010-08-19 NOTE — Assessment & Plan Note (Addendum)
Will get a plain film done today to see if the hardware is in place, also I have offered him a referral to Dr. Shon Baton for f/up eval s/p prior lumbar surgery, For now, will continue pain relief with NucyntaER since he tells me that it is helping with his pain

## 2010-08-19 NOTE — Patient Instructions (Signed)
Back Pain & Injury Your back pain is most likely caused by a strain of the muscles or ligaments supporting the spine. Back strains cause pain and trouble moving because of muscle spasms. They may take several weeks to heal. Usually they are better in days.  Treatment for back pain includes:  Rest - Get bed rest as needed over the next day or two. Use a firm mattress and lie on your side with your knees slightly bent. If you lie on your back, put a pillow under your knees.   Early movement - Back pain improves most rapidly if you remain active. It is much more stressful on the back to sit or stand in one place. Do not sit, drive or stand in one place for more than 30 minutes at a time. Take short walks on level surfaces as soon as pain allows.   Limit bending and lifting - Do not bend over or lift anything over 20 pounds until instructed otherwise. Lift by bending your knees. Use your leg muscles to help. Keep the load close to your body and avoid twisting. Do not reach or do overhead work.   Medicines - Medicine to reduce pain and inflammation are helpful. Muscle-relaxing drugs may be prescribed.   Therapy - Put ice packs on your back every few hours for the first 2-3 days after your injury or as instructed. After that ice or heat may be alternated to reduce pain and spasm. Back exercises and gentle massage may be of some benefit. You should be examined again if your back pain is not better in one week.  SEEK IMMEDIATE MEDICAL CARE IF:  You have pain that radiates from your back into your legs.   You develop new bowel or bladder control problems.   You have unusual weakness or numbness in your arms or legs.   You develop nausea or vomiting.   You develop abdominal pain.   You feel faint.  Document Released: 05/05/2005 Document Re-Released: 02/12/2008 [employer] Patient Information 2011 [employer]. 

## 2010-08-19 NOTE — Progress Notes (Signed)
  Subjective:    Patient ID: Mike Orozco, male    DOB: 10/10/1961, 50 y.o.   MRN: [id number]  Back Pain This is a chronic problem. The current episode started more than 1 month ago. The problem occurs every several days. The problem is unchanged. The pain is present in the lumbar spine. The quality of the pain is described as aching and shooting. The pain radiates to the right thigh. The pain is at a severity of 3/10. The pain is mild. The pain is worse during the day. The symptoms are aggravated by position. Stiffness is present all day. Associated symptoms include leg pain. Pertinent negatives include no abdominal pain, bladder incontinence, bowel incontinence, chest pain, dysuria, fever, headaches, numbness, paresis, paresthesias, pelvic pain, perianal numbness, tingling, weakness or weight loss. He has tried analgesics for the symptoms. The treatment provided significant relief.      Review of Systems  Constitutional: Negative for fever, chills, weight loss, diaphoresis, activity change, appetite change, fatigue and unexpected weight change.  Respiratory: Negative for cough, choking, chest tightness, shortness of breath, wheezing and stridor.   Cardiovascular: Negative for chest pain, palpitations and leg swelling.  Gastrointestinal: Negative for nausea, vomiting, abdominal pain, diarrhea and bowel incontinence.  Genitourinary: Negative for bladder incontinence, dysuria, enuresis, difficulty urinating and pelvic pain.  Musculoskeletal: Positive for back pain. Negative for myalgias, joint swelling, arthralgias and gait problem.  Skin: Negative for color change and pallor.  Neurological: Negative for dizziness, tingling, tremors, seizures, syncope, facial asymmetry, speech difficulty, weakness, light-headedness, numbness, headaches and paresthesias.  Psychiatric/Behavioral: Negative for hallucinations, behavioral problems, confusion, dysphoric mood, decreased concentration and agitation.      Objective:   Physical Exam  Constitutional: He is oriented to person, place, and time. He appears well-developed and well-nourished. No distress.  HENT:  Head: Normocephalic.  Right Ear: External ear normal.  Left Ear: External ear normal.  Nose: Nose normal.  Mouth/Throat: Oropharynx is clear and moist. No oropharyngeal exudate.  Eyes: Conjunctivae and EOM are normal. Pupils are equal, round, and reactive to light. Right eye exhibits no discharge. Left eye exhibits no discharge. No scleral icterus.  Neck: Normal range of motion. Neck supple. No thyromegaly present.  Cardiovascular: Normal rate, regular rhythm, normal heart sounds and intact distal pulses.  Exam reveals no gallop and no friction rub.   No murmur heard. Pulmonary/Chest: Effort normal and breath sounds normal. No respiratory distress. He has no wheezes. He has no rales. He exhibits no tenderness.  Abdominal: Soft. He exhibits no distension and no mass. There is no tenderness. There is no guarding.  Musculoskeletal: Normal range of motion. He exhibits no edema and no tenderness.       Lumbar back: He exhibits deformity (scar). He exhibits normal range of motion, no tenderness, no bony tenderness, no swelling, no edema, no laceration, no pain, no spasm and normal pulse.  Lymphadenopathy:    He has no cervical adenopathy.  Neurological: He is alert and oriented to person, place, and time. He has normal reflexes. He displays normal reflexes. No cranial nerve deficit. He exhibits normal muscle tone. Coordination normal.  Skin: Skin is warm and dry. No rash noted. He is not diaphoretic. No erythema. No pallor.  Psychiatric: He has a normal mood and affect. His behavior is normal. Judgment and thought content normal.          Assessment & Plan:

## 2010-08-21 ENCOUNTER — Telehealth: Payer: Self-pay | Admitting: *Deleted

## 2010-08-21 NOTE — Telephone Encounter (Signed)
He was given an Rx for nucynta-er

## 2010-08-21 NOTE — Telephone Encounter (Signed)
Pt left vm - anti-inflammatory meds (advil, tylenol, aleve) has not helped. He is req rx for pain.

## 2010-08-22 NOTE — Telephone Encounter (Signed)
ERROR - did not complete below. Spoke w/pt nucynta has helped w/back pain. He is also taking advil 1 bid. Advised pt to keep apt with back MD - he agreed

## 2010-08-22 NOTE — Telephone Encounter (Signed)
Pt has been taking nucynta ER and advil 1 of

## 2010-08-26 ENCOUNTER — Ambulatory Visit (HOSPITAL_COMMUNITY): Payer: Managed Care, Other (non HMO) | Attending: Internal Medicine

## 2010-08-26 DIAGNOSIS — I5022 Chronic systolic (congestive) heart failure: Secondary | ICD-10-CM

## 2010-08-26 DIAGNOSIS — I509 Heart failure, unspecified: Secondary | ICD-10-CM | POA: Insufficient documentation

## 2010-08-26 DIAGNOSIS — I379 Nonrheumatic pulmonary valve disorder, unspecified: Secondary | ICD-10-CM | POA: Insufficient documentation

## 2010-08-26 DIAGNOSIS — I10 Essential (primary) hypertension: Secondary | ICD-10-CM | POA: Insufficient documentation

## 2010-08-26 DIAGNOSIS — I319 Disease of pericardium, unspecified: Secondary | ICD-10-CM | POA: Insufficient documentation

## 2010-08-26 DIAGNOSIS — I428 Other cardiomyopathies: Secondary | ICD-10-CM | POA: Insufficient documentation

## 2010-08-26 DIAGNOSIS — I080 Rheumatic disorders of both mitral and aortic valves: Secondary | ICD-10-CM | POA: Insufficient documentation

## 2010-08-26 DIAGNOSIS — I079 Rheumatic tricuspid valve disease, unspecified: Secondary | ICD-10-CM | POA: Insufficient documentation

## 2010-08-28 ENCOUNTER — Telehealth: Payer: Self-pay | Admitting: Internal Medicine

## 2010-08-29 ENCOUNTER — Telehealth: Payer: Self-pay | Admitting: Internal Medicine

## 2010-08-29 NOTE — Telephone Encounter (Signed)
Pt returning your call pt states to please give him a call after 430pm he will be home.

## 2010-08-29 NOTE — Telephone Encounter (Signed)
PT AWARE OF ECHO RESULTS./CY 

## 2010-08-29 NOTE — Telephone Encounter (Signed)
SEE DOCUMENTATION ON TEST REPORT./CY

## 2010-09-04 ENCOUNTER — Ambulatory Visit (INDEPENDENT_AMBULATORY_CARE_PROVIDER_SITE_OTHER): Payer: Managed Care, Other (non HMO) | Admitting: Internal Medicine

## 2010-09-04 ENCOUNTER — Encounter: Payer: Self-pay | Admitting: Internal Medicine

## 2010-09-04 DIAGNOSIS — I428 Other cardiomyopathies: Secondary | ICD-10-CM

## 2010-09-04 DIAGNOSIS — I10 Essential (primary) hypertension: Secondary | ICD-10-CM

## 2010-09-04 DIAGNOSIS — M109 Gout, unspecified: Secondary | ICD-10-CM

## 2010-09-04 LAB — BASIC METABOLIC PANEL
Chloride: 103 mEq/L (ref 96–112)
Potassium: 4 mEq/L (ref 3.5–5.1)
Sodium: 142 mEq/L (ref 135–145)

## 2010-09-04 LAB — URIC ACID: Uric Acid, Serum: 9.9 mg/dL — ABNORMAL HIGH (ref 4.0–7.8)

## 2010-09-04 MED ORDER — NAPROXEN 500 MG PO TABS: ORAL_TABLET | ORAL | Status: DC

## 2010-09-04 NOTE — Progress Notes (Signed)
  HPI  Mike Orozco is a 50 y.o. male Seen in followup for consideration of ICD implantation because of severe left ventricular dysfunction in the setting of no obstructive coronary disease.  He was initially identified in the fall 2011. Ejection fraction at that time was 10%. Repeat ejection fraction in February demonstrated interval improvement to 30%. We have watched her another 2 months, and his echo yesterday demonstrated ejection fraction of 45-50%. His symptoms of heart failure have largely resolved.  He does have pain now in his left knee. This is new. Does not have a prior history of gout Past Medical History  Diagnosis Date  . Hypertension   . Heart failure, chronic systolic   . Non-ischemic cardiomyopathy     a) Echo 03/25/2010: EF 20-25%; mild AI; Mod MR; Mild LAE  b)cardiac cath 03/26/2010: normal cors; severe pulmonary HTN;PCWP 41; EF 10-15%  . History of ETOH abuse     Past Surgical History  Procedure Date  . L5-s1 fusion     status post  . Cystectomy 1994    Removal from left lung  at [facility]    Current Outpatient Prescriptions  Medication Sig Dispense Refill  . aspirin 81 MG EC tablet Take 81 mg by mouth daily.        . carvedilol (COREG) 25 MG tablet Take 25 mg by mouth 2 (two) times daily with a meal.        . furosemide (LASIX) 40 MG tablet Take 40 mg by mouth daily.        . hydrALAZINE (APRESOLINE) 50 MG tablet Take 50 mg by mouth 3 (three) times daily.        . isosorbide mononitrate (IMDUR) 30 MG 24 hr tablet Take 30 mg by mouth daily.        Marland Kitchen lisinopril (PRINIVIL,ZESTRIL) 40 MG tablet Take 40 mg by mouth daily.        . pravastatin (PRAVACHOL) 40 MG tablet Take 1 tablet (40 mg total) by mouth every evening.  30 tablet  11  . spironolactone (ALDACTONE) 25 MG tablet Take 25 mg by mouth daily.        . Tapentadol HCl (NUCYNTA ER) 50 MG TB12 Take 1 tablet by mouth 2 (two) times daily as needed (pain).  60 tablet  0    No Known Allergies  Review of Systems  negative except from HPI and PMH  Physical Exam Well developed and well nourished in no acute distress HENT normal E scleral and icterus clear Neck Supple JVP flat; carotids brisk and full Clear to ausculation Regular rate and rhythm, no murmurs gallops or rubclinical the PMI is not displaced Soft with active bowel sounds No clubbing cyanosis and edema There is swelling and warmth of his left knee laterally there is no posterior tenderness Alert and oriented, grossly normal motor and sensory function Skin Warm and Dry    Assessment and  Plan

## 2010-09-04 NOTE — Patient Instructions (Addendum)
Your physician recommends that you schedule a follow-up appointment in: KEEP APPT WITH DR CRENSHAW  NEXT MONTH Your physician has recommended you make the following change in your medication: STOP FUROSEMIDE START NAPROSYN 500 MG TWICE DAILY FOR 1 WEEK FOR KNEE PAIN

## 2010-09-04 NOTE — Assessment & Plan Note (Signed)
Will treat with naproxen for 1 week.  And check uric acid

## 2010-09-04 NOTE — Assessment & Plan Note (Signed)
Well-controlled. Because of the issues listed below, will maintain his current myopathy meds as they are except for the Lasix.

## 2010-09-04 NOTE — Assessment & Plan Note (Signed)
Given the near normalization of his LV function, there is a challenge to know what to do with his cardiomyopathy medications. He has significant hypertension as demonstrated by the fact that his blood pressure today was 1:30 on 6 antihypertensive medications. As he is euvolemic and has was probably gout in his left knee, I will take the liberty of discontinuing his Lasix. I will defer the management of his other medications to Dr. Jens Som whom you will be seeing next month.

## 2010-09-05 ENCOUNTER — Ambulatory Visit: Payer: Managed Care, Other (non HMO) | Admitting: Internal Medicine

## 2010-09-09 ENCOUNTER — Telehealth: Payer: Self-pay | Admitting: *Deleted

## 2010-09-09 NOTE — Telephone Encounter (Signed)
Pt was given rx from Dr Graciela Husbands (naproxyen) but it has not helped. Pt scheduled for ov tomorrow for further eval

## 2010-09-10 ENCOUNTER — Ambulatory Visit (INDEPENDENT_AMBULATORY_CARE_PROVIDER_SITE_OTHER): Payer: Managed Care, Other (non HMO) | Admitting: Internal Medicine

## 2010-09-10 ENCOUNTER — Ambulatory Visit (INDEPENDENT_AMBULATORY_CARE_PROVIDER_SITE_OTHER)
Admission: RE | Admit: 2010-09-10 | Discharge: 2010-09-10 | Disposition: A | Discharge status: Home / Self Care | Payer: Managed Care, Other (non HMO) | Source: Ambulatory Visit | Attending: Internal Medicine | Admitting: Internal Medicine

## 2010-09-10 ENCOUNTER — Encounter: Payer: Self-pay | Admitting: Internal Medicine

## 2010-09-10 VITALS — BP 138/84 | HR 61 | Temp 97.9°F | Resp 16 | Wt 207.0 lb

## 2010-09-10 DIAGNOSIS — M25562 Pain in left knee: Secondary | ICD-10-CM | POA: Insufficient documentation

## 2010-09-10 DIAGNOSIS — M25569 Pain in unspecified knee: Secondary | ICD-10-CM

## 2010-09-10 NOTE — Patient Instructions (Signed)
Arthritis - Degenerative, Osteoarthritis You have osteoarthritis. This is the wear and tear arthritis that comes with aging. It is also called degenerative arthritis. This is common in people past middle age. It is caused by stress on the joints from living. The large weight bearing joints of the lower extremities are most often affected. The knees, hips, back, neck, and hands can become painful, swollen, and stiff. This is the most common type of arthritis. It comes on with age, carrying too much weight, and from injury. Treatment includes resting the sore joint until the pain and swelling improve. Crutches or a walker may be needed for severe flares. Only take over-the-counter or prescription medicines for pain, discomfort, or fever as directed by your caregiver. Local heat therapy may improve motion. Cortisone shots into the joint are sometimes used to reduce pain and swelling during flares. Osteoarthritis is usually not crippling and progresses slowly. There are things you can do to decrease pain:  Avoid high impact activities.   Exercise regularly.   Low impact exercises such as walking, biking and swimming help to keep the muscles strong and keep normal joint function.   Stretching helps to keep your range of motion.   Lose weight if you are overweight. This reduces joint stress.  In severe cases when you have pain at rest or increasing disability, joint surgery may be helpful. See your caregiver for follow-up treatment as recommended.  SEEK IMMEDIATE MEDICAL CARE IF:  You have severe joint pain.   Marked swelling and redness in your joint develops.   You develop a high fever.  Document Released: 05/05/2005 Document Re-Released: 09/04/2007 [employer] Patient Information 2011 [employer]. 

## 2010-09-10 NOTE — Progress Notes (Signed)
  Subjective:    Patient ID: Mike Orozco, male    DOB: 10/10/1961, 50 y.o.   MRN: [id number]  Knee Pain  The incident occurred more than 1 week ago. There was no injury mechanism. The pain is present in the left knee. The quality of the pain is described as aching. The pain is at a severity of 2/10. The pain is mild. The pain has been improving since onset. Associated symptoms include a loss of motion. Pertinent negatives include no inability to bear weight, loss of sensation, muscle weakness, numbness or tingling. He reports no foreign bodies present. The symptoms are aggravated by movement. He has tried NSAIDs for the symptoms. The treatment provided moderate relief.      Review of Systems  Respiratory: Negative for apnea, cough, choking, chest tightness, shortness of breath and wheezing.   Cardiovascular: Negative for chest pain, palpitations and leg swelling.  Gastrointestinal: Negative for abdominal pain, diarrhea, constipation, blood in stool, abdominal distention and anal bleeding.  Musculoskeletal: Positive for arthralgias. Negative for myalgias, back pain, joint swelling and gait problem.  Skin: Negative for color change, pallor and rash.  Neurological: Negative for tingling, weakness and numbness.       Objective:   Physical Exam  Musculoskeletal:       Left knee: He exhibits decreased range of motion and bony tenderness. He exhibits no swelling, no effusion, no ecchymosis, no deformity, no laceration, no erythema, normal alignment, no LCL laxity, normal patellar mobility, normal meniscus and no MCL laxity. tenderness (over the medial compartment) found. Medial joint line tenderness noted. No lateral joint line, no MCL, no LCL and no patellar tendon tenderness noted.       Lab Results  Component Value Date   WBC 4.6 06/17/2010   HGB 12.0* 06/17/2010   HCT 35.9* 06/17/2010   PLT 298.0 06/17/2010   CHOL  Value: 172        ATP III CLASSIFICATION:  <200     mg/dL   Desirable   440-102  mg/dL   Borderline High  >=725    mg/dL   High        36/10/4401   TRIG 155* 03/26/2010   HDL 43 03/26/2010   ALT 35 4/74/2595   AST 25 06/17/2010   NA 142 09/04/2010   K 4.0 09/04/2010   CL 103 09/04/2010   CREATININE 1.2 09/04/2010   BUN 20 09/04/2010   CO2 32 09/04/2010   TSH 1.71 06/17/2010   PSA 0.91 06/17/2010   INR 1.04 03/26/2010    Assessment & Plan:

## 2010-09-10 NOTE — Assessment & Plan Note (Signed)
I think the pain is from DJD I see no signs of gout, xray shows significant involvement, he is getting relief with nsaids, if needed I will refer him for PT/MRI/ortho referral

## 2010-09-13 ENCOUNTER — Telehealth: Payer: Self-pay

## 2010-09-13 NOTE — Telephone Encounter (Signed)
[facility] Triage Call Report Triage Record Num: [id number] Operator: Albertine Grates Patient Name: Mike Orozco Call Date & Time: 09/11/2010 10:55:57PM Patient Phone: [phone] PCP: Sanda Linger Patient Gender: Male PCP Fax : Patient DOB: 10/10/1961 Practice Name: [facility] Reason for Call: Takes Hydralizine 50mg  TID and took one at 0700. Takes one at lunch and last one at 1900. Forgot to take one at lunch 4-25 and took when got home at 1630. Advised can take med now and then begin usual schedule 4-26. Protocol(s) Used: Medication Question Calls, No Triage (Adults) Recommended Outcome per Protocol: Provide Information or Advice Only Reason for Outcome: Caller has medication question only and triager answers question Care Advice: ~ 09/11/2010 11:01:05PM Page 1 of 1 [email or web address]

## 2010-09-16 ENCOUNTER — Telehealth: Payer: Self-pay | Admitting: Internal Medicine

## 2010-09-16 ENCOUNTER — Telehealth: Payer: Self-pay | Admitting: *Deleted

## 2010-09-16 NOTE — Telephone Encounter (Signed)
[facility] Triage Call Report Triage Record Num: [id number] Operator: Sula Rumple Patient Name: Mike Orozco Call Date & Time: 09/13/2010 7:20:37PM Patient Phone: [phone] PCP: Sanda Linger Patient Gender: Male PCP Fax : Patient DOB: 10/13/1961 [facility] Name[facility] Reason for Call: Pt calling on 09/13/10 asking about results of his knee x-ray/pt states he has called office and has not gotten a call back/advised to call in AM Protocol(s) Used: Office Note Recommended Outcome per Protocol: Information Noted and Sent to Office Reason for Outcome: Caller information to office Care Advice: ~ 09/13/2010 8:48:12PM Page 1 of 1 [email or web address]

## 2010-09-16 NOTE — Telephone Encounter (Signed)
Pts wife informed.

## 2010-09-16 NOTE — Telephone Encounter (Signed)
Patient requesting results of xray. (Ok to leave VM on hm # or mess with wife)

## 2010-09-16 NOTE — Telephone Encounter (Signed)
Xray shows mild arthirtis

## 2010-09-24 ENCOUNTER — Encounter: Payer: Self-pay | Admitting: Cardiology

## 2010-09-30 ENCOUNTER — Encounter: Payer: Managed Care, Other (non HMO) | Admitting: Cardiology

## 2010-09-30 NOTE — Progress Notes (Signed)
HPI: 50 year old male admitted to [facility] in November of 2011 with congestive heart failure. An echocardiogram showed an ejection fraction of 20-25% and moderate mitral regurgitation. There was mild aortic insufficiency and mild left atrial enlargement. Cardiac catheterization revealed an ejection fraction of 10-15% and normal coronary arteries. His left ventricular end-diastolic pressure was 35 and his pulmonary pressures were severely elevated. The patient was treated medically with improvement in his symptoms. Note his cardiomyopathy was felt most likely secondary to hypertension or alcohol. Seen by Dr Graciela Husbands for consideration of ICD but medical therapy continued and repeat echo in April of 2012 showed an EF of 45-50, mild LAE, mild AI. Since then,   Current Outpatient Prescriptions  Medication Sig Dispense Refill  . aspirin 81 MG EC tablet Take 81 mg by mouth daily.        . carvedilol (COREG) 25 MG tablet Take 25 mg by mouth 2 (two) times daily with a meal.        . furosemide (LASIX) 40 MG tablet Take 40 mg by mouth daily.        . hydrALAZINE (APRESOLINE) 50 MG tablet Take 50 mg by mouth 3 (three) times daily.        . isosorbide mononitrate (IMDUR) 30 MG 24 hr tablet Take 30 mg by mouth daily.        Marland Kitchen lisinopril (PRINIVIL,ZESTRIL) 40 MG tablet Take 40 mg by mouth daily.        . naproxen (NAPROSYN) 500 MG tablet TWICE DAILY FOR 1 WEEK  14 tablet  0  . pravastatin (PRAVACHOL) 40 MG tablet Take 1 tablet (40 mg total) by mouth every evening.  30 tablet  11  . spironolactone (ALDACTONE) 25 MG tablet Take 25 mg by mouth daily.        . Tapentadol HCl (NUCYNTA ER) 50 MG TB12 Take 1 tablet by mouth 2 (two) times daily as needed (pain).  60 tablet  0     Past Medical History  Diagnosis Date  . Hypertension   . Heart failure, chronic systolic   . Non-ischemic cardiomyopathy     a) Echo 03/25/2010: EF 20-25%; mild AI; Mod MR; Mild LAE  b)cardiac cath 03/26/2010: normal cors; severe  pulmonary HTN;PCWP 41; EF 10-15%  . History of ETOH abuse     Past Surgical History  Procedure Date  . L5-s1 fusion     status post  . Cystectomy 1994    Removal from left lung  at [facility]    History   Social History  . Marital Status: Married    Spouse Name: N/A    Number of Children: N/A  . Years of Education: N/A   Occupational History  . 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 Smoking status: Former Smoker    Types: Cigarettes    Quit date: 02/16/2010  . Smokeless tobacco: Not on file  . Alcohol Use: No     Quit 02/2010  . Drug Use: No  . Sexually Active: Not on file   Other Topics Concern  . Not on file   Social History Narrative   No regular exercisemarried    ROS: no fevers or chills, productive cough, hemoptysis, dysphasia, odynophagia, melena, hematochezia, dysuria, hematuria, rash, seizure activity, orthopnea, PND, pedal edema, claudication. Remaining systems are negative.  Physical Exam: Well-developed well-nourished in no acute distress.  Skin is warm and dry.  HEENT is normal.  Neck is supple.  No thyromegaly.  Chest is clear to auscultation with normal expansion.  Cardiovascular exam is regular rate and rhythm.  Abdominal exam nontender or distended. No masses palpated. Extremities show no edema. neuro grossly intact  ECG     This encounter was created in error - please disregard.

## 2010-10-04 ENCOUNTER — Encounter: Payer: Self-pay | Admitting: Cardiology

## 2010-10-04 NOTE — Op Note (Signed)
NAMEJEREMIH, DEARMAS NO.:  [id number]   MEDICAL RECORD NO.:  [id number]                   PATIENT TYPE:  INP   LOCATION:  3038                                 FACILITY:  [facility]   PHYSICIAN:  Reinaldo Meeker, M.D.              DATE OF BIRTH:  [birth year]   DATE OF PROCEDURE:  08/10/2003  DATE OF DISCHARGE:                                 OPERATIVE REPORT   PREOPERATIVE DIAGNOSIS:  Herniated disk L5-S1, left, recurrent.   POSTOPERATIVE DIAGNOSIS:  Herniated disk L5-S1, left, recurrent.   PROCEDURE:  1. Left L5-S1 PLIF with invasive bony spacer followed by left pedicle screw     instrumentation and right transfacet screw instrumentation followed by a     posterolateral fusion.  2. Microdissection L5-S1 disk. S1 nerve root as well as intraoperative EMG     monitoring.   SURGEON:  Reinaldo Meeker, M.D.   ASSISTANT:  Tia Alert, MD   DESCRIPTION OF PROCEDURE:  After being placed in the prone position, the  patient's back was prepped and draped in the usual sterile fashion.  A  localizing x-ray was taken prior to incision to identify the appropriate  level.  A midline incision was made over the spinous process of L5-S1.  Using the Bovie cautery and curet, the incision was carried down to the  spinous processes.  Subperiosteal dissection was then carried out  bilaterally on the spinous processes, lamina and facet joint.  A self-  retaining retractor was placed for exposure.  X-rays showed approach at the  appropriate level.  The spinous processes of L5 and S1 were removed.  On the  patient's left side, areas of the previous laminotomy were identified.  A  high speed drill was used to widen the laminotomy and bone was removed with  the Kerrison punch and saved for use later in the case.  Scar tissue was  dissected away from the thecal sac until the floor of the canal could be  identified.  Going around, the pedicle of S1 was also identified as was  the  L5 nerve root of the far lateral foramen.  At this point, microdissection  was used to identify the L5-S1 disk.  After coagulating on the annulus  laterally, the disk was incised with a 15 blade.  Using pituitary rongeurs  and curets, thorough disk space cleanout was carried out.  Very large  fragments of disk material from beneath the nerve root were removed and gave  excellent decompression of the S1 nerve root.  At this point, disk space was  prepared for a PLIF bone spacer.  Numerous instruments were used to  decorticate the end-plates and a 10 mm length was chosen.  After placing  autologous bone graft within the disk space, the plate was impacted.  Two  bones actually broke going into the disk space.  I therefore elected to  place the pedicles which were on the left side and distract them open and  then put the bone plug in.  This was successfully done.  Pedicle screws were  therefore placed in the standard fashion.  A 40 mm screw was placed at L5  and 35 mm screw placed at S1.  Distraction was then carried out.  The bone  plug was placed without difficulty.  A short rod was then placed, locking  mechanism secured after compressing the screws together.  Transfacet screws  were then placed on the patient's right side.  Drill over a guidewire was  passed to the inferior facet of L5 and then pedicle of S1.  This was  followed with AP and lateral fluoroscopy and direct palpation as well as  intraoperative EMG monitoring which showed no evidence of a breech of the  pedicle.  Tapping was then carried out followed by placement of a 25 mm  screw.  Fluoroscopy in an AP and lateral direction showed the screws in the  plug and felt to be in good position.  Large amounts of irrigation were  carried out.  High speed drill was used to decorticate the lamina and facet  joints of L5 and S1 on the right, and posterior lateral fusion was then  performed.  Gelfoam was then replaced on the patient's  left side and any  bleeding controlled with bipolar coagulation as well.  The wound was then closed using interrupted Vicryl in the muscle, fascia,  subcutaneous and subcuticular tissues and staples on the skin.  A sterile  dressing was then applied and the patient was extubated and taken to the  recovery room in stable condition.                                               Reinaldo Meeker, M.D.    ROK/MEDQ  D:  08/10/2003  T:  08/11/2003  Job:  [id number]

## 2010-10-10 ENCOUNTER — Ambulatory Visit: Payer: Managed Care, Other (non HMO) | Admitting: Cardiology

## 2010-10-21 ENCOUNTER — Encounter: Payer: Self-pay | Admitting: Physician Assistant

## 2010-10-21 ENCOUNTER — Ambulatory Visit: Payer: Managed Care, Other (non HMO) | Admitting: Internal Medicine

## 2010-10-21 ENCOUNTER — Ambulatory Visit (INDEPENDENT_AMBULATORY_CARE_PROVIDER_SITE_OTHER): Payer: Managed Care, Other (non HMO) | Admitting: Physician Assistant

## 2010-10-21 VITALS — BP 134/94 | HR 50 | Ht 66.0 in | Wt 203.0 lb

## 2010-10-21 DIAGNOSIS — I428 Other cardiomyopathies: Secondary | ICD-10-CM

## 2010-10-21 DIAGNOSIS — I10 Essential (primary) hypertension: Secondary | ICD-10-CM

## 2010-10-21 NOTE — Progress Notes (Unsigned)
  Subjective:    Patient ID: Mike Orozco, male    DOB: 10/10/1961, 50 y.o.   MRN: [id number]  HPI    Review of Systems     Objective:   Physical Exam      Lab Results  Component Value Date   WBC 4.6 06/17/2010   HGB 12.0* 06/17/2010   HCT 35.9* 06/17/2010   PLT 298.0 06/17/2010   CHOL  Value: 172        ATP III CLASSIFICATION:  <200     mg/dL   Desirable  469-629  mg/dL   Borderline High  >=528    mg/dL   High        41/07/2438   TRIG 155* 03/26/2010   HDL 43 03/26/2010   ALT 35 05/21/7251   AST 25 06/17/2010   NA 142 09/04/2010   K 4.0 09/04/2010   CL 103 09/04/2010   CREATININE 1.2 09/04/2010   BUN 20 09/04/2010   CO2 32 09/04/2010   TSH 1.71 06/17/2010   PSA 0.91 06/17/2010   INR 1.04 03/26/2010    Assessment & Plan:

## 2010-10-21 NOTE — Assessment & Plan Note (Signed)
His EF has made excellent improvement.  Dr. Graciela Husbands  no longer considers him a candidate for ICD.  He is a good medical regimen includes a beta blocker, ACE inhibitor, spironolactone, hydralazine and nitrates.  As noted, we could certainly go up on his hydralazine dose if needed for his blood pressure.  Follow up with Dr. Jens Som in 3 months.

## 2010-10-21 NOTE — Progress Notes (Signed)
History of Present Illness: Primary Cardiologist:  Dr. Olga Millers  Mike Orozco is a 50 year old male who has a nonischemic cardiomyopathy with EF initially at 15-20%.  Cardiac catheterization in 03/2010 revealed normal coronary arteries.  He has been followed by Dr. Jens Som and Dr. Graciela Husbands.  Recent echo in 4/12 demonstrated good recovery of LV function at 45-50% with mild LVH, grade 1 diast dysfxn, mild AI and mild LAE.  He was last seen by Dr. Graciela Husbands in 4/12 and I believe there is no plan to proceed with ICD given recovery of his LV fxn.  He was taken off lasix due to a question of gout and returns for follow up today.    He is doing well.  He denies any chest pain, shortness of breath, syncope.  He denies palpitations.  He denies orthopnea, PND or pedal edema.  He describes NYHA class I symptoms.  He is watching his salt.  He did run out of hydralazine and did not take it this morning.  He had been taking Naprosyn but is now off of that medication.  Past Medical History  Diagnosis Date  . Hypertension   . Heart failure, chronic systolic   . Non-ischemic cardiomyopathy     a) Echo 03/25/2010: EF 20-25%; mild AI; Mod MR; Mild LAE  b)cardiac cath 03/26/2010: normal cors; severe pulmonary HTN;PCWP 41; EF 10-15%;  c. echo 4/12:  EF 45-50%, mild LVH, grade 1 diast dysfxn, mild AI, mild LAE  . History of ETOH abuse     Current Outpatient Prescriptions  Medication Sig Dispense Refill  . aspirin 81 MG EC tablet Take 81 mg by mouth daily.        . carvedilol (COREG) 25 MG tablet Take 25 mg by mouth 2 (two) times daily with a meal.        . hydrALAZINE (APRESOLINE) 50 MG tablet Take 50 mg by mouth 3 (three) times daily.        . isosorbide mononitrate (IMDUR) 30 MG 24 hr tablet Take 30 mg by mouth daily.        Marland Kitchen lisinopril (PRINIVIL,ZESTRIL) 40 MG tablet Take 40 mg by mouth daily.        . naproxen (NAPROSYN) 500 MG tablet TWICE DAILY FOR 1 WEEK  14 tablet  0  . pravastatin (PRAVACHOL) 40 MG tablet  Take 1 tablet (40 mg total) by mouth every evening.  30 tablet  11  . spironolactone (ALDACTONE) 25 MG tablet Take 25 mg by mouth daily.        . Tapentadol HCl (NUCYNTA ER) 50 MG TB12 Take 1 tablet by mouth 2 (two) times daily as needed (pain).  60 tablet  0  . DISCONTD: furosemide (LASIX) 40 MG tablet Take 40 mg by mouth daily.          Allergies: No Known Allergies  Vital Signs: BP 134/94  Pulse 50  Ht 5\' 6"  (1.676 m)  Wt 203 lb (92.08 kg)  BMI 32.76 kg/m2  PHYSICAL EXAM: Well nourished, well developed, in no acute distress HEENT: normal Neck: no JVD Cardiac:  normal S1, S2; RRR; no murmur Lungs:  clear to auscultation bilaterally, no wheezing, rhonchi or rales Abd: soft, nontender, no hepatomegaly Ext: no edema Skin: warm and dry Neuro:  CNs 2-12 intact, no focal abnormalities noted  EKG:  Sinus bradycardia, heart rate 50, normal axis, first degree AV block with a PR interval of 220 ms, nonspecific ST-T wave changes  ASSESSMENT AND PLAN:

## 2010-10-21 NOTE — Patient Instructions (Addendum)
Your physician recommends that you schedule a follow-up appointment in: 3 MONTHS WITH DR. CRENSHAW AS PER SCOTT WEAVER, PA-C.  PLEASE MAKE AN APPT TO SEE THE NURSE FOR A BLOOD PRESSURE CHECK IN THE NEXT 1-2 WEEKS AS PER SCOTT WEAVER, PA-C

## 2010-10-21 NOTE — Assessment & Plan Note (Signed)
Uncontrolled.  Repeat blood pressure by me 134/94.  He took his last dose of hydralazine last night.  He goes to pick it up today.  I encouraged him to go ahead and get that refilled and we will recheck his blood pressure with the nurse in one to 2 weeks.  If it is still elevated, we can certainly increase his hydralazine to 75 mg 3 times a day.  We can also go up more on his isosorbide.

## 2010-11-04 ENCOUNTER — Ambulatory Visit (INDEPENDENT_AMBULATORY_CARE_PROVIDER_SITE_OTHER): Payer: Managed Care, Other (non HMO)

## 2010-11-04 VITALS — BP 153/91 | HR 55 | Resp 16

## 2010-11-04 DIAGNOSIS — I10 Essential (primary) hypertension: Secondary | ICD-10-CM

## 2010-11-04 MED ORDER — HYDRALAZINE HCL 50 MG PO TABS: ORAL_TABLET | ORAL | Status: DC

## 2010-11-04 NOTE — Progress Notes (Signed)
PT CAME IN FOR BP CHECK TODAY.  ON FIRST CHECK HIS BLOOD PRESSURE WAS 153/91 AND REPEAT 132/98 IN LEFT ARM WITH A REGULAR CUFF.  REVIEWED WITH SCOTT WEAVER, PA WHO INCREASED HYDRALAZINE TO 50 MG 1 AND 1/2 TABLETS THREE TIMES A DAY.  PT STATES UNDERSTANDING.  DIET INFORMATION FOR LOW SODIUM DIET WAS REVIEWED AND GIVEN TO PT AS WELL.

## 2010-11-18 ENCOUNTER — Ambulatory Visit: Payer: Managed Care, Other (non HMO) | Admitting: Internal Medicine

## 2010-11-29 ENCOUNTER — Telehealth: Payer: Self-pay

## 2010-11-29 NOTE — Telephone Encounter (Signed)
[facility] Triage Call Report Triage Record Num: [id number] Operator: Amy Head Patient Name: Mike Orozco Call Date & Time: 11/28/2010 8:27:39PM Patient Phone: [phone] PCP: Sanda Linger Patient Gender: Male PCP Fax : Patient DOB: 10/10/1961 Practice Name: [facility] Reason for Call: Pt/[place] calling for sharp pain to right flank area. Onset 11/25/10. Afebrile. Pain is intermittent, gets worse when he lays down. Has hx of Kidney Stones and feels like he did at that time. No urinary sxs. All emergent sxs per Flank Pain protocol r/o. Advised to be seen within 4 hours per guidelines. Protocol(s) Used: Flank Pain Recommended Outcome per Protocol: See Provider within 4 hours Reason for Outcome: History of kidney stones and has similar symptoms now Care Advice: ~ Go to the ED IMMEDIATELY if repeated vomiting or severe pain occurs. ~ Strain urine through cheesecloth or gauze. Take any strained materials to provider for evaluation. ~ Tell provider medical history of renal disease; especially if have only one kidney. During pregnancy, call provider if temperature is 100 F (37.7 C) or greater OR any temperature elevation for 3 days even while taking acetaminophen. ~ Increase intake of fluids. Try to drink 8 oz. (.2 liter) every hour when awake, including unsweetened cranberry juice, unless on restricted fluids for other medical reasons. Take sips of fluid or eat ice chips if nauseated or vomiting. ~ ~ Call provider immediately if urination is painful or decreased amount of urine output. ~ SYMPTOM / CONDITION MANAGEMENT ~ CAUTIONS Analgesic/Antipyretic Advice - Acetaminophen: Consider acetaminophen as directed on label or by pharmacist/provider for pain or fever PRECAUTIONS: - Use if there is no history of liver disease, alcoholism, or intake of three or more alcohol drinks per day - Only if approved by provider during pregnancy or when breastfeeding - During pregnancy, acetaminophen  should not be taken more than 3 consecutive days without telling provider - Do not exceed recommended dose or frequency ~ Systemic Inflammatory Response Syndrome (SIRS): Watch for signs of a generalized, whole body infection. Occurs within days of a localized infection, especially of the urinary, GI, respiratory or nervous systems; or after a traumatic injury or invasive procedure. - Call EMS 911 if symptoms have worsened, such as increasing confusion or unusual drowsiness; cold and clammy skin; no urine output; rapid respiration (>30/min.) or slow respiration (<10/min.); struggling to breathe. - Go to the ED immediately for early symptoms of rapid pulse >90/min. or rapid breathing >20/min. at rest; chills; oral temperature >100.4 F (38 C) or <96.8 F (36 C) when associated with conditions noted. ~ Analgesic/Antipyretic Advice - NSAIDs: Consider aspirin, ibuprofen, naproxen or ketoprofen for pain or fever as directed on label or by pharmacist/provider. PRECAUTIONS: - If over 52 years of age, should not take longer than 1 week without consulting provider. EXCEPTIONS: - Should not be used if taking blood thinners or have bleeding problems. - Do not use if have history of sensitivity/allergy to any of these medications; or history of cardiovascular, ulcer, kidney, liver disease or diabetes unless approved by provider. - Do not exceed recommended dose or frequency. ~ 11/28/2010 8:40:52PM Page 1 of 2 [id number] [facility] Triage Call Report Patient Name: Mike Orozco continuation page/s 07/

## 2010-11-30 ENCOUNTER — Inpatient Hospital Stay (INDEPENDENT_AMBULATORY_CARE_PROVIDER_SITE_OTHER)
Admission: RE | Admit: 2010-11-30 | Discharge: 2010-11-30 | Disposition: A | Discharge status: Home / Self Care | Payer: Managed Care, Other (non HMO) | Source: Ambulatory Visit | Attending: Family Medicine | Admitting: Family Medicine

## 2010-11-30 DIAGNOSIS — M545 Low back pain, unspecified: Secondary | ICD-10-CM

## 2010-11-30 LAB — POCT URINALYSIS DIP (DEVICE)
Bilirubin Urine: NEGATIVE
Glucose, UA: NEGATIVE mg/dL
Ketones, ur: NEGATIVE mg/dL
Specific Gravity, Urine: 1.025 (ref 1.005–1.030)
Urobilinogen, UA: 0.2 mg/dL (ref 0.0–1.0)

## 2010-12-02 ENCOUNTER — Ambulatory Visit: Payer: Managed Care, Other (non HMO) | Admitting: Internal Medicine

## 2010-12-16 ENCOUNTER — Other Ambulatory Visit: Payer: Self-pay | Admitting: Internal Medicine

## 2010-12-16 ENCOUNTER — Ambulatory Visit (INDEPENDENT_AMBULATORY_CARE_PROVIDER_SITE_OTHER): Payer: Managed Care, Other (non HMO) | Admitting: Internal Medicine

## 2010-12-16 ENCOUNTER — Ambulatory Visit (INDEPENDENT_AMBULATORY_CARE_PROVIDER_SITE_OTHER)
Admission: RE | Admit: 2010-12-16 | Discharge: 2010-12-16 | Disposition: A | Discharge status: Home / Self Care | Payer: Managed Care, Other (non HMO) | Source: Ambulatory Visit | Attending: Internal Medicine | Admitting: Internal Medicine

## 2010-12-16 ENCOUNTER — Encounter: Payer: Self-pay | Admitting: Internal Medicine

## 2010-12-16 VITALS — BP 118/82 | HR 62 | Temp 97.8°F | Resp 16 | Wt 192.0 lb

## 2010-12-16 DIAGNOSIS — D649 Anemia, unspecified: Secondary | ICD-10-CM

## 2010-12-16 DIAGNOSIS — D638 Anemia in other chronic diseases classified elsewhere: Secondary | ICD-10-CM | POA: Insufficient documentation

## 2010-12-16 DIAGNOSIS — E781 Pure hyperglyceridemia: Secondary | ICD-10-CM | POA: Insufficient documentation

## 2010-12-16 DIAGNOSIS — R109 Unspecified abdominal pain: Secondary | ICD-10-CM

## 2010-12-16 DIAGNOSIS — N200 Calculus of kidney: Secondary | ICD-10-CM

## 2010-12-16 DIAGNOSIS — I10 Essential (primary) hypertension: Secondary | ICD-10-CM

## 2010-12-16 DIAGNOSIS — Z23 Encounter for immunization: Secondary | ICD-10-CM

## 2010-12-16 NOTE — Progress Notes (Signed)
Subjective:    Patient ID: Mike Orozco, male    DOB: 10/10/1961, 50 y.o.   MRN: [id number]  Flank Pain This is a new problem. The current episode started 1 to 4 weeks ago. The problem occurs intermittently. The problem has been gradually improving since onset. Pain location: right posterior flank. The quality of the pain is described as aching. The pain does not radiate. The pain is at a severity of 1/10. The pain is mild. The pain is worse during the day. The symptoms are aggravated by bending. Stiffness is present all day. Pertinent negatives include no abdominal pain, bladder incontinence, bowel incontinence, chest pain, dysuria, fever, headaches, leg pain, numbness, paresis, paresthesias, pelvic pain, perianal numbness, tingling, weakness or weight loss. Risk factors include lack of exercise. He has tried NSAIDs for the symptoms. The treatment provided moderate relief.  He tells me that he went to an [facility] and that his urine and labs were normal.    Review of Systems  Constitutional: Negative for fever, chills, weight loss, diaphoresis, activity change, appetite change, fatigue and unexpected weight change.  HENT: Negative.   Eyes: Negative.   Respiratory: Negative for apnea, cough, choking, chest tightness, shortness of breath, wheezing and stridor.   Cardiovascular: Negative for chest pain, palpitations and leg swelling.  Gastrointestinal: Negative for nausea, vomiting, abdominal pain, diarrhea, constipation, blood in stool, abdominal distention and bowel incontinence.  Genitourinary: Positive for flank pain. Negative for bladder incontinence, dysuria, urgency, frequency, hematuria, decreased urine volume, discharge, penile swelling, scrotal swelling, enuresis, difficulty urinating, genital sores, penile pain, testicular pain and pelvic pain.  Musculoskeletal: Negative for myalgias, back pain, joint swelling, arthralgias and gait problem.  Skin: Negative for color change, pallor and rash.    Neurological: Negative for dizziness, tingling, tremors, seizures, syncope, facial asymmetry, speech difficulty, weakness, light-headedness, numbness, headaches and paresthesias.  Hematological: Negative for adenopathy. Does not bruise/bleed easily.  Psychiatric/Behavioral: Negative.        Objective:   Physical Exam  Vitals reviewed. Constitutional: He is oriented to person, place, and time. He appears well-developed and well-nourished. No distress.  HENT:  Head: Normocephalic and atraumatic.  Right Ear: External ear normal.  Left Ear: External ear normal.  Nose: Nose normal.  Mouth/Throat: Oropharynx is clear and moist. No oropharyngeal exudate.  Eyes: Conjunctivae and EOM are normal. Pupils are equal, round, and reactive to light. Right eye exhibits no discharge. Left eye exhibits no discharge. No scleral icterus.  Neck: Normal range of motion. Neck supple. No JVD present. No tracheal deviation present. No thyromegaly present.  Cardiovascular: Normal rate, regular rhythm, normal heart sounds and intact distal pulses.  Exam reveals no gallop and no friction rub.   No murmur heard. Pulmonary/Chest: Effort normal and breath sounds normal. No stridor. No respiratory distress. He has no wheezes. He has no rales. He exhibits no tenderness.  Abdominal: Soft. Normal appearance and bowel sounds are normal. He exhibits no shifting dullness, no distension, no pulsatile liver, no fluid wave, no abdominal bruit, no ascites, no pulsatile midline mass and no mass. There is no hepatosplenomegaly, splenomegaly or hepatomegaly. There is no tenderness. There is no rigidity, no rebound, no guarding, no tenderness at McBurney's point and negative Murphy's sign. Hernia confirmed negative in the right inguinal area and confirmed negative in the left inguinal area.  Musculoskeletal: Normal range of motion. He exhibits no edema and no tenderness.  Lymphadenopathy:    He has no cervical adenopathy.   Neurological: He is alert and oriented to  person, place, and time. He has normal reflexes. He displays normal reflexes. No cranial nerve deficit. He exhibits normal muscle tone. Coordination normal.  Skin: Skin is warm and dry. No rash noted. He is not diaphoretic. No erythema. No pallor.  Psychiatric: He has a normal mood and affect. His behavior is normal. Judgment and thought content normal.      Lab Results  Component Value Date   WBC 4.6 06/17/2010   HGB 12.0* 06/17/2010   HCT 35.9* 06/17/2010   PLT 298.0 06/17/2010   CHOL  Value: 172        ATP III CLASSIFICATION:  <200     mg/dL   Desirable  161-096  mg/dL   Borderline High  >=045    mg/dL   High        40/01/8118   TRIG 155* 03/26/2010   HDL 43 03/26/2010   ALT 35 1/47/8295   AST 25 06/17/2010   NA 142 09/04/2010   K 4.0 09/04/2010   CL 103 09/04/2010   CREATININE 1.2 09/04/2010   BUN 20 09/04/2010   CO2 32 09/04/2010   TSH 1.71 06/17/2010   PSA 0.91 06/17/2010   INR 1.04 03/26/2010      Assessment & Plan:   No problem-specific assessment & plan notes found for this encounter.

## 2010-12-16 NOTE — Assessment & Plan Note (Signed)
BP is well controlled 

## 2010-12-16 NOTE — Assessment & Plan Note (Signed)
He will get the labs from his recent UC visit forwarded to me and I have asked him to do a CT scan to look for stones

## 2010-12-16 NOTE — Patient Instructions (Signed)
Flank Pain Flank pain refers to pain that is located on the side of the body between the upper abdomen and the back. It can be caused by many things. CAUSES Some of the more common causes of flank pain include:  Muscle strain.   Muscle spasms.   A disease of your spine (vertebral disk disease).   A lung infection (pneumonia).   Fluid around your lungs (pulmonary edema).   A kidney infection.   Kidney stones.   A very painful skin rash on only one side of your body (shingles).   Gallbladder disease.  DIAGNOSIS Blood tests, urine tests, and X-rays may help your caregiver determine what is wrong. TREATMENT The treatment of pain depends on the cause. Your caregiver will determine what treatment will work best for you. HOME CARE INSTRUCTIONS  Home care will depend on the cause of your pain.   Some medications may help relieve the pain. Take medication for relief of pain as directed by your caregiver.   Tell your caregiver about any changes in your pain.   Follow up with your caregiver.  SEEK IMMEDIATE MEDICAL CARE IF:  Your pain is not controlled with medication.   The pain increases.   You have abdominal pain.   You have shortness of breath.   You have persistent nausea or vomiting.   You have swelling in your abdomen.   You feel faint or pass out.   You have a temperature by mouth above 100.5, not controlled by medicine.  MAKE SURE YOU:  Understand these instructions.   Will watch your condition.   Will get help right away if you are not doing well or get worse.  Document Released: 06/26/2005 Document Re-Released: 10/23/2009 [employer] Patient Information 2011 [employer]. 

## 2010-12-18 ENCOUNTER — Telehealth: Payer: Self-pay | Admitting: *Deleted

## 2010-12-18 DIAGNOSIS — M545 Low back pain: Secondary | ICD-10-CM

## 2010-12-18 DIAGNOSIS — N200 Calculus of kidney: Secondary | ICD-10-CM

## 2010-12-18 MED ORDER — OXYCODONE-ACETAMINOPHEN 7.5-325 MG PO TABS: 1.0000 | ORAL_TABLET | ORAL | Status: DC | PRN

## 2010-12-18 MED ORDER — TAPENTADOL HCL ER 50 MG PO TB12: 1.0000 | ORAL_TABLET | Freq: Two times a day (BID) | ORAL | Status: DC | PRN

## 2010-12-18 NOTE — Telephone Encounter (Signed)
Patient called office - Informed him of CT results and referral. PCC's are working on getting pt earlier apt - he is scheduled for 8/23. Patient requesting RX for pain med, says he is out of med given by UC.

## 2010-12-18 NOTE — Telephone Encounter (Signed)
pls ask him to pick up the Nucynta Rx

## 2010-12-18 NOTE — Telephone Encounter (Signed)
Pt informed he will come pick up prescription. rx given to sarah as pt will not be here until after 5 pm

## 2010-12-18 NOTE — Telephone Encounter (Signed)
done

## 2010-12-18 NOTE — Telephone Encounter (Signed)
Patient requesting RX for generic med for pain, wants what UC gave him. I checked with his pharm, no pain meds filled recently. Would you consider changing rx to generic?  - pt has zero out of pocket for generics.

## 2011-01-09 ENCOUNTER — Telehealth: Payer: Self-pay | Admitting: Cardiology

## 2011-01-09 ENCOUNTER — Ambulatory Visit (HOSPITAL_COMMUNITY)
Admission: RE | Admit: 2011-01-09 | Discharge: 2011-01-09 | Disposition: A | Discharge status: Home / Self Care | Payer: Managed Care, Other (non HMO) | Source: Ambulatory Visit | Attending: Urology | Admitting: Urology

## 2011-01-09 ENCOUNTER — Other Ambulatory Visit (HOSPITAL_COMMUNITY): Payer: Self-pay | Admitting: Urology

## 2011-01-09 DIAGNOSIS — D49519 Neoplasm of unspecified behavior of unspecified kidney: Secondary | ICD-10-CM

## 2011-01-09 DIAGNOSIS — C649 Malignant neoplasm of unspecified kidney, except renal pelvis: Secondary | ICD-10-CM | POA: Insufficient documentation

## 2011-01-09 NOTE — Telephone Encounter (Signed)
Patient blood pressure taken at another md office 99/62. Discuss medication .

## 2011-01-13 NOTE — Telephone Encounter (Signed)
Pt calling to get in touch w/ Debra. Pt c/o low BP, 92/66. Pt wanted to get permission to take one pill instead of 1 1/2 pills of pt hydralazine.  Please return pt call to advise/discuss.

## 2011-01-15 ENCOUNTER — Other Ambulatory Visit: Payer: Self-pay | Admitting: Internal Medicine

## 2011-01-15 ENCOUNTER — Ambulatory Visit (INDEPENDENT_AMBULATORY_CARE_PROVIDER_SITE_OTHER): Payer: Managed Care, Other (non HMO) | Admitting: Internal Medicine

## 2011-01-15 ENCOUNTER — Encounter: Payer: Self-pay | Admitting: Internal Medicine

## 2011-01-15 ENCOUNTER — Other Ambulatory Visit (INDEPENDENT_AMBULATORY_CARE_PROVIDER_SITE_OTHER): Payer: Managed Care, Other (non HMO)

## 2011-01-15 DIAGNOSIS — E781 Pure hyperglyceridemia: Secondary | ICD-10-CM

## 2011-01-15 DIAGNOSIS — I10 Essential (primary) hypertension: Secondary | ICD-10-CM

## 2011-01-15 DIAGNOSIS — R109 Unspecified abdominal pain: Secondary | ICD-10-CM

## 2011-01-15 DIAGNOSIS — D649 Anemia, unspecified: Secondary | ICD-10-CM

## 2011-01-15 LAB — COMPREHENSIVE METABOLIC PANEL
AST: 29 U/L (ref 0–37)
Albumin: 4.3 g/dL (ref 3.5–5.2)
Alkaline Phosphatase: 57 U/L (ref 39–117)
BUN: 26 mg/dL — ABNORMAL HIGH (ref 6–23)
Glucose, Bld: 93 mg/dL (ref 70–99)
Potassium: 4.3 mEq/L (ref 3.5–5.1)
Sodium: 140 mEq/L (ref 135–145)
Total Bilirubin: 0.5 mg/dL (ref 0.3–1.2)
Total Protein: 7.4 g/dL (ref 6.0–8.3)

## 2011-01-15 LAB — CBC WITH DIFFERENTIAL/PLATELET
Basophils Relative: 0.6 % (ref 0.0–3.0)
Eosinophils Absolute: 0.1 10*3/uL (ref 0.0–0.7)
Hemoglobin: 10.4 g/dL — ABNORMAL LOW (ref 13.0–17.0)
Lymphocytes Relative: 34.4 % (ref 12.0–46.0)
Monocytes Relative: 8.5 % (ref 3.0–12.0)
Neutro Abs: 2.6 10*3/uL (ref 1.4–7.7)
Neutrophils Relative %: 54 % (ref 43.0–77.0)
RBC: 3.33 Mil/uL — ABNORMAL LOW (ref 4.22–5.81)
WBC: 4.7 10*3/uL (ref 4.5–10.5)

## 2011-01-15 LAB — IBC PANEL
Iron: 75 ug/dL (ref 42–165)
Transferrin: 257.9 mg/dL (ref 212.0–360.0)

## 2011-01-15 LAB — LIPID PANEL
HDL: 36.9 mg/dL — ABNORMAL LOW (ref >39.00)
Triglycerides: 247 mg/dL — ABNORMAL HIGH (ref 0.0–149.0)
VLDL: 49.4 mg/dL — ABNORMAL HIGH (ref 0.0–40.0)

## 2011-01-15 NOTE — Patient Instructions (Signed)
Hypertension (High Blood Pressure) As your heart beats, it forces blood through your arteries. This force is your blood pressure. If the pressure is too high, it is called hypertension (HTN) or high blood pressure. HTN is dangerous because you may have it and not know it. High blood pressure may mean that your heart has to work harder to pump blood. Your arteries may be narrow or stiff. The extra work puts you at risk for heart disease, stroke, and other problems.  Blood pressure consists of two numbers, a higher number over a lower, 110/72, for example. It is stated as "110 over 72." The ideal is below 120 for the top number (systolic) and under 80 for the bottom (diastolic). Write down your blood pressure today. You should pay close attention to your blood pressure if you have certain conditions such as:  Heart failure.  Prior heart attack.   Diabetes   Chronic kidney disease.   Prior stroke.   Multiple risk factors for heart disease.   To see if you have HTN, your blood pressure should be measured while you are seated with your arm held at the level of the heart. It should be measured at least twice. A one-time elevated blood pressure reading (especially in the Emergency Department) does not mean that you need treatment. There may be conditions in which the blood pressure is different between your right and left arms. It is important to see your caregiver soon for a recheck. Most people have essential hypertension which means that there is not a specific cause. This type of high blood pressure may be lowered by changing lifestyle factors such as:  Stress.  Smoking.   Lack of exercise.   Excessive weight.  Drug/tobacco/alcohol use.   Eating less salt.   Most people do not have symptoms from high blood pressure until it has caused damage to the body. Effective treatment can often prevent, delay or reduce that damage. TREATMENT Treatment for high blood pressure, when a cause has been  identified, is directed at the cause. There are a large number of medications to treat HTN. These fall into several categories, and your caregiver will help you select the medicines that are best for you. Medications may have side effects. You should review side effects with your caregiver. If your blood pressure stays high after you have made lifestyle changes or started on medicines,   Your medication(s) may need to be changed.   Other problems may need to be addressed.   Be certain you understand your prescriptions, and know how and when to take your medicine.   Be sure to follow up with your caregiver within the time frame advised (usually within two weeks) to have your blood pressure rechecked and to review your medications.   If you are taking more than one medicine to lower your blood pressure, make sure you know how and at what times they should be taken. Taking two medicines at the same time can result in blood pressure that is too low.  SEEK IMMEDIATE MEDICAL CARE IF YOU DEVELOP:  A severe headache, blurred or changing vision, or confusion.   Unusual weakness or numbness, or a faint feeling.   Severe chest or abdominal pain, vomiting, or breathing problems.  MAKE SURE YOU:   Understand these instructions.   Will watch your condition.   Will get help right away if you are not doing well or get worse.  Document Released: 05/05/2005 Document Re-Released: 10/23/2009 [employer] Patient Information 2011 [employer].Anemia - Nonspecific Your exam and blood tests show you are anemic. This means your blood (hemoglobin) level is low. Normal hemoglobin values are 12-15 for females and 14-17 for males. Make a note of your hemoglobin level today. The hematocrit percent is also used to measure anemia. A normal hematocrit is 38-46 in females and 42-49 in males. Make a note of your hematocrit level today. SYMPTOMS Anemia can come on suddenly (acute). It can also come on slowly (chronic).  Symptoms can include:  Minor weakness.   Dizziness.   Palpitations.  Shortness of breath.   Symptoms may be absent until half your hemoglobin is missing if it comes on slowly. Anemia due to acute blood loss from an injury or internal bleeding may require blood transfusion if the loss is severe. Hospital care is needed if you are anemic and there is significant continued blood loss. CAUSES Anemia can be due to many different causes.  Excessive bleeding from periods is a common problem in women.  Other causes can include:  Intestinal bleeding.   Poor nutrition.   Kidney, thyroid, liver, and bone marrow diseases.  TREATMENT  Stool tests for blood (Hemoccult) and additional lab tests are often needed. This determines the best treatment.   Further checking on your condition and your response to treatment is very important. It often takes many weeks to correct anemia.  Depending on the cause, treatment can include:  Supplements of iron.   Vitamins B12 and folic acid.   Hormone medicines.  If your anemia is due to bleeding, finding the cause of the blood loss is very important. This will help avoid further problems. SEEK IMMEDIATE MEDICAL CARE IF:  You develop fainting, extreme weakness, shortness of breath, or chest pain.   You develop heavy vaginal bleeding.   You develop bloody or black, tarry stools or vomit up blood.   You develop a high fever, rash, repeated vomiting, or dehydration.  Document Released: 06/12/2004 Document Re-Released: 10/23/2009 [employer] Patient Information 2011 [employer]. 

## 2011-01-15 NOTE — Progress Notes (Signed)
Subjective:    Patient ID: Mike Orozco, male    DOB: 10/10/1961, 50 y.o.   MRN: [id number]  Hypertension This is a chronic problem. The current episode started more than 1 year ago. The problem is unchanged. The problem is controlled. Pertinent negatives include no anxiety, blurred vision, chest pain, headaches, malaise/fatigue, neck pain, orthopnea, palpitations, peripheral edema, PND, shortness of breath or sweats. Past treatments include diuretics, direct vasodilators, ACE inhibitors and beta blockers. The current treatment provides significant improvement. Compliance problems include medication side effects (dizziness and severe orthostasis).   Anemia Presents for follow-up visit. Symptoms include light-headedness. There has been no abdominal pain, anorexia, bruising/bleeding easily, confusion, fever, leg swelling, malaise/fatigue, pallor, palpitations, paresthesias, pica or weight loss. Signs of blood loss that are not present include hematemesis, hematochezia and melena. There are no compliance problems.       Review of Systems  Constitutional: Negative for fever, chills, weight loss, malaise/fatigue, diaphoresis, activity change, appetite change, fatigue and unexpected weight change.  HENT: Negative for sore throat, facial swelling, trouble swallowing, neck pain and voice change.   Eyes: Negative.  Negative for blurred vision.  Respiratory: Negative for apnea, cough, choking, chest tightness, shortness of breath, wheezing and stridor.   Cardiovascular: Negative for chest pain, palpitations, orthopnea, leg swelling and PND.  Gastrointestinal: Negative for nausea, vomiting, abdominal pain, diarrhea, constipation, blood in stool, melena, hematochezia, abdominal distention, anal bleeding, anorexia and hematemesis.  Genitourinary: Positive for flank pain (he had a CT scan w dye done 4 days ago + for bilat stones). Negative for dysuria, urgency, frequency, hematuria, decreased urine volume,  discharge, penile swelling, scrotal swelling, enuresis, difficulty urinating, genital sores, penile pain and testicular pain.  Musculoskeletal: Negative for myalgias, back pain, joint swelling, arthralgias and gait problem.  Skin: Negative for pallor and wound.  Neurological: Positive for dizziness and light-headedness. Negative for tremors, seizures, syncope, facial asymmetry, speech difficulty, weakness, numbness, headaches and paresthesias.  Hematological: Negative for adenopathy. Does not bruise/bleed easily.  Psychiatric/Behavioral: Negative for suicidal ideas, hallucinations, behavioral problems, confusion, sleep disturbance, self-injury, dysphoric mood, decreased concentration and agitation. The patient is not nervous/anxious and is not hyperactive.        Objective:   Physical Exam  Vitals reviewed. Constitutional: He appears well-developed and well-nourished. No distress.  HENT:  Mouth/Throat: Oropharynx is clear and moist. No oropharyngeal exudate.  Eyes: Conjunctivae are normal. Right eye exhibits no discharge. Left eye exhibits no discharge. No scleral icterus.  Neck: Normal range of motion. Neck supple. No JVD present. No tracheal deviation present. No thyromegaly present.  Cardiovascular: Normal rate, regular rhythm and normal heart sounds.  Exam reveals no gallop and no friction rub.   No murmur heard. Pulmonary/Chest: Effort normal and breath sounds normal. No stridor. No respiratory distress. He has no wheezes. He has no rales. He exhibits no tenderness.  Abdominal: Soft. Bowel sounds are normal. He exhibits no distension and no mass. There is no tenderness. There is no rebound and no guarding.  Musculoskeletal: Normal range of motion. He exhibits no edema and no tenderness.  Lymphadenopathy:    He has no cervical adenopathy.  Neurological: He is alert. He has normal reflexes. He displays normal reflexes. No cranial nerve deficit. He exhibits normal muscle tone. Coordination  normal.  Skin: Skin is warm and dry. No rash noted. He is not diaphoretic. No erythema. No pallor.  Psychiatric: He has a normal mood and affect. His behavior is normal. Judgment and thought content normal.  Lab Results  Component Value Date   WBC 4.6 06/17/2010   HGB 12.0* 06/17/2010   HCT 35.9* 06/17/2010   PLT 298.0 06/17/2010   CHOL  Value: 172        ATP III CLASSIFICATION:  <200     mg/dL   Desirable  952-841  mg/dL   Borderline High  >=324    mg/dL   High        40/05/270   TRIG 155* 03/26/2010   HDL 43 03/26/2010   ALT 35 5/36/6440   AST 25 06/17/2010   NA 142 09/04/2010   K 4.0 09/04/2010   CL 103 09/04/2010   CREATININE 1.2 09/04/2010   BUN 20 09/04/2010   CO2 32 09/04/2010   TSH 1.71 06/17/2010   PSA 0.91 06/17/2010   INR 1.04 03/26/2010      Assessment & Plan:

## 2011-01-15 NOTE — Assessment & Plan Note (Signed)
He has stones and is seeing a urologist

## 2011-01-15 NOTE — Assessment & Plan Note (Signed)
He has no symptoms of blood loss, I will recheck his CBC today and will his vitamin levels as well

## 2011-01-15 NOTE — Assessment & Plan Note (Signed)
Recheck his FLP today

## 2011-01-15 NOTE — Assessment & Plan Note (Signed)
He is having symptomatic hypotension so I have asked him to stop hydralazine, today I will check his renal function

## 2011-01-16 ENCOUNTER — Encounter: Payer: Self-pay | Admitting: Internal Medicine

## 2011-01-16 LAB — FERRITIN: Ferritin: 368.3 ng/mL — ABNORMAL HIGH (ref 22.0–322.0)

## 2011-01-16 LAB — TSH: TSH: 1.68 u[IU]/mL (ref 0.35–5.50)

## 2011-01-16 LAB — VITAMIN B12: Vitamin B-12: 569 pg/mL (ref 211–911)

## 2011-01-17 ENCOUNTER — Telehealth: Payer: Self-pay

## 2011-01-17 ENCOUNTER — Other Ambulatory Visit: Payer: Self-pay | Admitting: Internal Medicine

## 2011-01-24 NOTE — Telephone Encounter (Signed)
Unable to reach pt or leave a message Mike Orozco

## 2011-01-27 ENCOUNTER — Encounter: Payer: Managed Care, Other (non HMO) | Admitting: Cardiology

## 2011-01-27 NOTE — Progress Notes (Signed)
HPI: Pleasant male previously admitted to [facility] in November of 2011 with congestive heart failure. An echocardiogram showed an ejection fraction of 20-25% and moderate mitral regurgitation. There was mild aortic insufficiency and mild left atrial enlargement. Cardiac catheterization revealed an ejection fraction of 10-15% and normal coronary arteries. His left ventricular end-diastolic pressure was 35 and his pulmonary pressures were severely elevated. The patient was treated medically with improvement in his symptoms. Note his cardiomyopathy was felt most likely secondary to hypertension or alcohol. Last echo in April of 2012 showed improved EF on meds of 45-50, mild LAE, mild AI, trivial MR. Since he was last seen,   Current Outpatient Prescriptions  Medication Sig Dispense Refill  . aspirin 81 MG EC tablet Take 81 mg by mouth daily.        . carvedilol (COREG) 25 MG tablet Take 25 mg by mouth 2 (two) times daily with a meal.        . isosorbide mononitrate (IMDUR) 30 MG 24 hr tablet TAKE 1 TABLET BY MOUTH EVERY DAY  30 tablet  6  . lisinopril (PRINIVIL,ZESTRIL) 40 MG tablet Take 40 mg by mouth daily.        . naproxen (NAPROSYN) 500 MG tablet TWICE DAILY FOR 1 WEEK  14 tablet  0  . oxyCODONE-acetaminophen (PERCOCET) 7.5-325 MG per tablet Take 1 tablet by mouth every 4 (four) hours as needed for pain.  120 tablet  0  . pravastatin (PRAVACHOL) 40 MG tablet Take 1 tablet (40 mg total) by mouth every evening.  30 tablet  11  . spironolactone (ALDACTONE) 25 MG tablet TAKE 1 TABLET BY MOUTH EVERY DAY  30 tablet  6     Past Medical History  Diagnosis Date  . Hypertension   . Heart failure, chronic systolic   . Non-ischemic cardiomyopathy     a) Echo 03/25/2010: EF 20-25%; mild AI; Mod MR; Mild LAE  b)cardiac cath 03/26/2010: normal cors; severe pulmonary HTN;PCWP 41; EF 10-15%;  c. echo 4/12:  EF 45-50%, mild LVH, grade 1 diast dysfxn, mild AI, mild LAE  . History of ETOH abuse      Past Surgical History  Procedure Date  . L5-s1 fusion     status post  . Cystectomy 1994    Removal from left lung  at [facility]    History   Social History  . Marital Status: Married    Spouse Name: N/A    Number of Children: N/A  . Years of Education: N/A   Occupational History  . 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 Smoking status: Former Smoker    Types: Cigarettes    Quit date: 02/16/2010  . Smokeless tobacco: Not on file  . Alcohol Use: No     Quit 02/2010  . Drug Use: No  . Sexually Active: Yes    Birth Control/ Protection: Condom   Other Topics Concern  . Not on file   Social History Narrative   No regular exercisemarried    ROS: no fevers or chills, productive cough, hemoptysis, dysphasia, odynophagia, melena, hematochezia, dysuria, hematuria, rash, seizure activity, orthopnea, PND, pedal edema, claudication. Remaining systems are negative.  Physical Exam: Well-developed well-nourished in no acute distress.  Skin is warm and dry.  HEENT is normal.  Neck is supple. No thyromegaly.  Chest is clear to auscultation with normal expansion.  Cardiovascular exam is regular rate and rhythm.  Abdominal exam nontender or  distended. No masses palpated. Extremities show no edema. neuro grossly intact  ECG     This encounter was created in error - please disregard.

## 2011-01-27 NOTE — Telephone Encounter (Signed)
Prior open encounter, closing phone note

## 2011-02-03 ENCOUNTER — Telehealth: Payer: Self-pay | Admitting: Cardiology

## 2011-02-03 ENCOUNTER — Encounter: Payer: Self-pay | Admitting: *Deleted

## 2011-02-03 NOTE — Telephone Encounter (Signed)
Pt calling needing cardiac clearance for knee surgery anesthesia. Please return pt call to discuss further.

## 2011-02-03 NOTE — Telephone Encounter (Signed)
Ok for surgery Brian Crenshaw  

## 2011-02-03 NOTE — Telephone Encounter (Signed)
Spoke with pt, he is scheduled for orthoscopic knee surgery on Wednesday and needs clearance. Dr Rush Barer is doing the surgery. Will forward for dr Jens Som review Deliah Goody

## 2011-02-03 NOTE — Telephone Encounter (Signed)
Left message for pt, clearance letter to be sent to dr gerber Deliah Goody

## 2011-02-04 ENCOUNTER — Telehealth: Payer: Self-pay | Admitting: Cardiology

## 2011-02-04 NOTE — Telephone Encounter (Signed)
LMOM for call back. 

## 2011-02-04 NOTE — Telephone Encounter (Signed)
Pt calling regarding paperwork for surgical clearance for knee surgery. Pt said originally surgery was scheduled for tomorrow however, appt has been postponed due to lack of surgical clearance from cardiologist. Please return pt call to discuss further.

## 2011-02-04 NOTE — Telephone Encounter (Signed)
Pt calling stating that he got the letter and took care of it and pt knee surgery is scheduled for Thursday. No need to return pt call.

## 2011-02-04 NOTE — Telephone Encounter (Signed)
LMOM that we did receive a form for surgical clearance from [facility] and will have Dr.Crenshaw sign and fax back tomorrow.

## 2011-02-05 ENCOUNTER — Other Ambulatory Visit: Payer: Self-pay | Admitting: Orthopedic Surgery

## 2011-02-05 ENCOUNTER — Encounter (HOSPITAL_COMMUNITY): Payer: Managed Care, Other (non HMO)

## 2011-02-05 LAB — DIFFERENTIAL
Basophils Relative: 0 % (ref 0–1)
Eosinophils Absolute: 0.1 10*3/uL (ref 0.0–0.7)
Eosinophils Relative: 2 % (ref 0–5)
Lymphs Abs: 1.8 10*3/uL (ref 0.7–4.0)
Monocytes Relative: 7 % (ref 3–12)

## 2011-02-05 LAB — COMPREHENSIVE METABOLIC PANEL
ALT: 29 U/L (ref 0–53)
BUN: 15 mg/dL (ref 6–23)
CO2: 25 mEq/L (ref 19–32)
Calcium: 9.9 mg/dL (ref 8.4–10.5)
Creatinine, Ser: 1.06 mg/dL (ref 0.50–1.35)
GFR calc Af Amer: >60 mL/min (ref >60)
GFR calc non Af Amer: >60 mL/min (ref >60)
Glucose, Bld: 141 mg/dL — ABNORMAL HIGH (ref 70–99)
Sodium: 140 mEq/L (ref 135–145)

## 2011-02-05 LAB — CBC
MCH: 31.1 pg (ref 26.0–34.0)
MCHC: 34.5 g/dL (ref 30.0–36.0)
MCV: 90.2 fL (ref 78.0–100.0)
Platelets: 264 10*3/uL (ref 150–400)
RDW: 13.3 % (ref 11.5–15.5)

## 2011-02-05 LAB — URINALYSIS, ROUTINE W REFLEX MICROSCOPIC
Bilirubin Urine: NEGATIVE
Leukocytes, UA: NEGATIVE
Nitrite: NEGATIVE
Specific Gravity, Urine: 1.031 — ABNORMAL HIGH (ref 1.005–1.030)
Urobilinogen, UA: 1 mg/dL (ref 0.0–1.0)

## 2011-02-05 LAB — URINE MICROSCOPIC-ADD ON

## 2011-02-05 LAB — PROTIME-INR: Prothrombin Time: 14.5 seconds (ref 11.6–15.2)

## 2011-02-06 ENCOUNTER — Ambulatory Visit (HOSPITAL_COMMUNITY)
Admission: RE | Admit: 2011-02-06 | Discharge: 2011-02-06 | Disposition: A | Discharge status: Home / Self Care | Payer: Managed Care, Other (non HMO) | Source: Ambulatory Visit | Attending: Orthopedic Surgery | Admitting: Orthopedic Surgery

## 2011-02-06 DIAGNOSIS — M23329 Other meniscus derangements, posterior horn of medial meniscus, unspecified knee: Secondary | ICD-10-CM | POA: Insufficient documentation

## 2011-02-06 DIAGNOSIS — I428 Other cardiomyopathies: Secondary | ICD-10-CM | POA: Insufficient documentation

## 2011-02-06 DIAGNOSIS — I10 Essential (primary) hypertension: Secondary | ICD-10-CM | POA: Insufficient documentation

## 2011-02-06 DIAGNOSIS — M659 Unspecified synovitis and tenosynovitis, unspecified site: Secondary | ICD-10-CM | POA: Insufficient documentation

## 2011-02-06 DIAGNOSIS — M2240 Chondromalacia patellae, unspecified knee: Secondary | ICD-10-CM | POA: Insufficient documentation

## 2011-02-06 DIAGNOSIS — Z01812 Encounter for preprocedural laboratory examination: Secondary | ICD-10-CM | POA: Insufficient documentation

## 2011-02-06 DIAGNOSIS — M1710 Unilateral primary osteoarthritis, unspecified knee: Secondary | ICD-10-CM | POA: Insufficient documentation

## 2011-02-06 DIAGNOSIS — M23302 Other meniscus derangements, unspecified lateral meniscus, unspecified knee: Secondary | ICD-10-CM | POA: Insufficient documentation

## 2011-02-06 HISTORY — PX: KNEE ARTHROSCOPY W/ MENISCECTOMY: SHX1879

## 2011-02-08 NOTE — Op Note (Signed)
NAMESARTHAK, RUBENSTEIN NO.:  [id number]  MEDICAL RECORD NO.:  [id number]  LOCATION:  DAY[facility]                         FACILITY:  [facility]  PHYSICIAN:  Georges Lynch. Rahel Carlton, M.D.DATE OF BIRTH:  10/10/1961  DATE OF PROCEDURE:  02/06/2011 DATE OF DISCHARGE:  02/06/2011                              OPERATIVE REPORT   SURGEON:  Windy Fast A. Atonya Templer, M.D.  ASSISTANT:  Nurses.  PREOPERATIVE DIAGNOSES: 1. Degenerative arthritis, left knee. 2. Torn medial meniscus, left knee. 3. Torn lateral meniscus, left knee.  POSTOPERATIVE DIAGNOSES: 1. Degenerative arthritis, left knee. 2. Torn medial meniscus, left knee. 3. Torn lateral meniscus, left knee.  OPERATION: 1. Diagnostic arthroscopy, left knee. 2. Medial meniscectomy, posterior horn, left knee. 3. Partial lateral meniscectomy, left knee. 4. Abrasion chondroplasty of patella, left knee. 5. Synovectomy, suprapatellar pouch, left knee. 6. Abrasion chondroplasty of medial femoral condyle.  PROCEDURE:  Under general anesthesia, routine orthopedic prep and draping of the left lower extremity was carried out.  The appropriate time-out was carried out, four incisions were made.  Also on the holding area, I marked the appropriate left leg.  At this time, the patient had 1 g of IV Ancef.  After sterile prep and draping, a small incision was made in suprapatellar pouch.  The inflow cannula was entered from lateral approach and the knee was distended with saline.  Following that, another small punctate incision was made in the anterolateral joint.  I then entered the scope from the lateral approach.  I did a complete diagnostic arthroscopy on up into the suprapatellar pouch.  He had rather significant chondromalacia of his patella and synovitis.  I made a small incision medially.  I then inserted the ArthroCare and did an abrasion chondroplasty and synovectomy.  I then went down in the lateral joint.  He had multiple peripheral  tears of the lateral meniscus.  I introduced a shaver suction device and did a partial lateral meniscectomy.  Remaining part of the meniscus was intact. Cruciate looked fine.  However, over the medial joint, he had rather significant degenerative arthritis medially.  I introduced a shaver suction device, did an abrasion chondroplasty of the medial femoral condyle.  I then after that went down, did a partial resection of the posterior horn of the medial meniscus.  Of note, he had some significant arthritis in the medial joint.  I thoroughly irrigated out the knee, removed all the fluid, closed all three punctate incisions with 3-0 nylon suture.  I injected 30 cc of 0.25% Marcaine and epinephrine into the knee joint.  A sterile Neosporin dressing was applied. 1. Postop, he will be on aspirin 325 mg b.i.d. for 2 weeks starting     today. 2. He will be on Percocet 10/650 one every 4 hours p.r.n. for pain. 3. He will be on crutches partial to full weightbearing as tolerated. 4. I will see him in the office in 2 weeks or prior if he suffers any     problem whatsoever.          ______________________________ Georges Lynch Darrelyn Hillock, M.D.     RAG/MEDQ  D:  02/06/2011  T:  02/06/2011  Job:  [id number]  Electronically Signed by Ranee Gosselin M.D. on 02/08/2011 09:12:33 AM

## 2011-02-17 ENCOUNTER — Ambulatory Visit (INDEPENDENT_AMBULATORY_CARE_PROVIDER_SITE_OTHER): Payer: Managed Care, Other (non HMO) | Admitting: Internal Medicine

## 2011-02-17 ENCOUNTER — Encounter: Payer: Self-pay | Admitting: Internal Medicine

## 2011-02-17 VITALS — BP 122/82 | HR 57 | Temp 97.8°F | Resp 16 | Wt 197.0 lb

## 2011-02-17 DIAGNOSIS — E781 Pure hyperglyceridemia: Secondary | ICD-10-CM

## 2011-02-17 DIAGNOSIS — D649 Anemia, unspecified: Secondary | ICD-10-CM

## 2011-02-17 DIAGNOSIS — I10 Essential (primary) hypertension: Secondary | ICD-10-CM

## 2011-02-17 MED ORDER — FENOFIBRATE 150 MG PO CAPS: 1.0000 | ORAL_CAPSULE | Freq: Every day | ORAL | Status: DC

## 2011-02-17 NOTE — Assessment & Plan Note (Signed)
Start lipofen

## 2011-02-17 NOTE — Progress Notes (Signed)
Subjective:    Patient ID: Mike Orozco, male    DOB: 10/10/1961, 50 y.o.   MRN: [id number]  Hyperlipidemia This is a chronic problem. The current episode started more than 1 year ago. The problem is uncontrolled. Recent lipid tests were reviewed and are variable. He has no history of chronic renal disease, diabetes, hypothyroidism, liver disease, obesity or nephrotic syndrome. Factors aggravating his hyperlipidemia include no known factors. Pertinent negatives include no chest pain, focal sensory loss, focal weakness, leg pain, myalgias or shortness of breath. Current antihyperlipidemic treatment includes statins. The current treatment provides moderate improvement of lipids. Compliance problems include adherence to exercise and adherence to diet.       Review of Systems  Constitutional: Negative for fever, chills, diaphoresis, activity change, appetite change, fatigue and unexpected weight change.  HENT: Negative for sore throat, facial swelling, trouble swallowing, neck pain, neck stiffness and voice change.   Eyes: Negative.   Respiratory: Negative for apnea, cough, choking, chest tightness, shortness of breath, wheezing and stridor.   Cardiovascular: Negative for chest pain, palpitations and leg swelling.  Gastrointestinal: Negative for nausea, vomiting, abdominal pain, diarrhea, constipation, blood in stool, abdominal distention and anal bleeding.  Genitourinary: Negative for dysuria, urgency, frequency, hematuria, flank pain, decreased urine volume, enuresis and difficulty urinating.  Musculoskeletal: Negative for myalgias, back pain, joint swelling, arthralgias and gait problem.  Skin: Negative for color change, pallor, rash and wound.  Neurological: Negative for dizziness, tremors, focal weakness, seizures, syncope, facial asymmetry, speech difficulty, weakness, light-headedness, numbness and headaches.  Hematological: Negative for adenopathy. Does not bruise/bleed easily.    Psychiatric/Behavioral: Negative.        Objective:   Physical Exam  Vitals reviewed. Constitutional: He is oriented to person, place, and time. He appears well-developed and well-nourished. No distress.  HENT:  Head: Normocephalic and atraumatic.  Mouth/Throat: Oropharynx is clear and moist. No oropharyngeal exudate.  Eyes: Conjunctivae are normal. Right eye exhibits no discharge. Left eye exhibits no discharge. No scleral icterus.  Neck: Normal range of motion. Neck supple. No JVD present. No tracheal deviation present. No thyromegaly present.  Cardiovascular: Normal rate, regular rhythm, normal heart sounds and intact distal pulses.  Exam reveals no gallop and no friction rub.   No murmur heard. Pulmonary/Chest: Effort normal and breath sounds normal. No stridor. No respiratory distress. He has no wheezes. He has no rales. He exhibits no tenderness.  Abdominal: Soft. Bowel sounds are normal. He exhibits no distension and no mass. There is no tenderness. There is no rebound and no guarding.  Musculoskeletal: Normal range of motion. He exhibits no edema and no tenderness.  Lymphadenopathy:    He has no cervical adenopathy.  Neurological: He is oriented to person, place, and time. He displays normal reflexes. No cranial nerve deficit. He exhibits normal muscle tone. Coordination normal.  Skin: Skin is warm and dry. No rash noted. He is not diaphoretic. No erythema. No pallor.  Psychiatric: He has a normal mood and affect. His behavior is normal. Judgment and thought content normal.     Lab Results  Component Value Date   WBC 4.6 02/05/2011   HGB 10.8* 02/05/2011   HCT 31.3* 02/05/2011   PLT 264 02/05/2011   CHOL 125 01/15/2011   TRIG 247.0* 01/15/2011   HDL 36.90* 01/15/2011   LDLDIRECT 64.5 01/15/2011   ALT 29 02/05/2011   AST 24 02/05/2011   NA 140 02/05/2011   K 3.5 02/05/2011   CL 104 02/05/2011   CREATININE 1.06  02/05/2011   BUN 15 02/05/2011   CO2 25 02/05/2011   TSH 1.68  01/15/2011   PSA 0.91 06/17/2010   INR 1.11 02/05/2011       Assessment & Plan:

## 2011-02-17 NOTE — Patient Instructions (Signed)
Anemia - Nonspecific Your exam and blood tests show you are anemic. This means your blood (hemoglobin) level is low. Normal hemoglobin values are 12-15 for females and 14-17 for males. Make a note of your hemoglobin level today. The hematocrit percent is also used to measure anemia. A normal hematocrit is 38-46 in females and 42-49 in males. Make a note of your hematocrit level today. SYMPTOMS Anemia can come on suddenly (acute). It can also come on slowly (chronic). Symptoms can include:  Minor weakness.   Dizziness.   Palpitations.  Shortness of breath.   Symptoms may be absent until half your hemoglobin is missing if it comes on slowly. Anemia due to acute blood loss from an injury or internal bleeding may require blood transfusion if the loss is severe. Hospital care is needed if you are anemic and there is significant continued blood loss. CAUSES Anemia can be due to many different causes.  Excessive bleeding from periods is a common problem in women.  Other causes can include:  Intestinal bleeding.   Poor nutrition.   Kidney, thyroid, liver, and bone marrow diseases.  TREATMENT  Stool tests for blood (Hemoccult) and additional lab tests are often needed. This determines the best treatment.   Further checking on your condition and your response to treatment is very important. It often takes many weeks to correct anemia.  Depending on the cause, treatment can include:  Supplements of iron.   Vitamins B12 and folic acid.   Hormone medicines.  If your anemia is due to bleeding, finding the cause of the blood loss is very important. This will help avoid further problems. SEEK IMMEDIATE MEDICAL CARE IF:  You develop fainting, extreme weakness, shortness of breath, or chest pain.   You develop heavy vaginal bleeding.   You develop bloody or black, tarry stools or vomit up blood.   You develop a high fever, rash, repeated vomiting, or dehydration.  Document Released:  06/12/2004 Document Re-Released: 10/23/2009 Exit[employer] Patient Information 2011 [employer]. 

## 2011-02-17 NOTE — Assessment & Plan Note (Signed)
This has improved some.  

## 2011-02-17 NOTE — Assessment & Plan Note (Signed)
His BP is well controlled 

## 2011-03-03 ENCOUNTER — Ambulatory Visit (INDEPENDENT_AMBULATORY_CARE_PROVIDER_SITE_OTHER): Payer: Managed Care, Other (non HMO)

## 2011-03-03 ENCOUNTER — Ambulatory Visit (INDEPENDENT_AMBULATORY_CARE_PROVIDER_SITE_OTHER): Payer: Managed Care, Other (non HMO) | Admitting: Cardiology

## 2011-03-03 ENCOUNTER — Encounter: Payer: Self-pay | Admitting: Cardiology

## 2011-03-03 DIAGNOSIS — I428 Other cardiomyopathies: Secondary | ICD-10-CM

## 2011-03-03 DIAGNOSIS — F17200 Nicotine dependence, unspecified, uncomplicated: Secondary | ICD-10-CM

## 2011-03-03 DIAGNOSIS — Z23 Encounter for immunization: Secondary | ICD-10-CM

## 2011-03-03 DIAGNOSIS — I5022 Chronic systolic (congestive) heart failure: Secondary | ICD-10-CM

## 2011-03-03 DIAGNOSIS — I10 Essential (primary) hypertension: Secondary | ICD-10-CM

## 2011-03-03 NOTE — Assessment & Plan Note (Signed)
Blood pressure decreased. Discontinue imdur.

## 2011-03-03 NOTE — Assessment & Plan Note (Signed)
Patient euvolemic on examination. Continue present medications. 

## 2011-03-03 NOTE — Assessment & Plan Note (Signed)
Patient has discontinued. 

## 2011-03-03 NOTE — Patient Instructions (Signed)
Your physician wants you to follow-up in: 6 MONTHS You will receive a reminder letter in the mail two months in advance. If you don't receive a letter, please call our office to schedule the follow-up appointment.   STOP ISOSORBIDE

## 2011-03-03 NOTE — Assessment & Plan Note (Addendum)
Continue ACE inhibitor and beta blocker. Blood pressure is borderline today with a systolic approximately 90 upon recheck. I will discontinue imdur. Plan followup echocardiogram in 6 months to one year to reassess LV function. I think his previous reduction in LV function was most likely secondary to uncontrolled versus alcohol abuse.

## 2011-03-03 NOTE — Progress Notes (Signed)
NWG:[id number] male who has a nonischemic cardiomyopathy with EF initially at 15-20%. Cardiac catheterization in 03/2010 revealed normal coronary arteries. Last echo in 4/12 demonstrated good recovery of LV function at 45-50% with mild LVH, grade 1 diast dysfxn, mild AI and mild LAE. He was seen by Dr. Graciela Husbands in 4/12 and there is no plan to proceed with ICD given recovery of his LV fxn. He was last seen in June of 2012. Since then, the patient denies any dyspnea on exertion, orthopnea, PND, pedal edema, palpitations, syncope or chest pain.    Current Outpatient Prescriptions  Medication Sig Dispense Refill  . aspirin 81 MG EC tablet Take 81 mg by mouth daily.        . carvedilol (COREG) 25 MG tablet Take 25 mg by mouth 2 (two) times daily with a meal.        . Fenofibrate (LIPOFEN) 150 MG CAPS Take 1 capsule (150 mg total) by mouth daily.  30 each  11  . isosorbide mononitrate (IMDUR) 30 MG 24 hr tablet TAKE 1 TABLET BY MOUTH EVERY DAY  30 tablet  6  . lisinopril (PRINIVIL,ZESTRIL) 40 MG tablet Take 40 mg by mouth daily.        Marland Kitchen oxyCODONE-acetaminophen (PERCOCET) 7.5-325 MG per tablet Take 1 tablet by mouth every 4 (four) hours as needed for pain.  120 tablet  0  . pravastatin (PRAVACHOL) 40 MG tablet Take 1 tablet (40 mg total) by mouth every evening.  30 tablet  11  . spironolactone (ALDACTONE) 25 MG tablet TAKE 1 TABLET BY MOUTH EVERY DAY  30 tablet  6  . naproxen (NAPROSYN) 500 MG tablet 1 tab po qd          Past Medical History  Diagnosis Date  . Hypertension   . Heart failure, chronic systolic   . Non-ischemic cardiomyopathy     a) Echo 03/25/2010: EF 20-25%; mild AI; Mod MR; Mild LAE  b)cardiac cath 03/26/2010: normal cors; severe pulmonary HTN;PCWP 41; EF 10-15%;  c. echo 4/12:  EF 45-50%, mild LVH, grade 1 diast dysfxn, mild AI, mild LAE  . History of ETOH abuse     Past Surgical History  Procedure Date  . L5-s1 fusion     status post  . Cystectomy 1994    Removal from left lung   at [facility]    History   Social History  . Marital Status: Married    Spouse Name: N/A    Number of Children: N/A  . Years of Education: N/A   Occupational History  . 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 Smoking status: Former Smoker    Types: Cigarettes    Quit date: 02/16/2010  . Smokeless tobacco: Not on file  . Alcohol Use: No     Quit 02/2010  . Drug Use: No  . Sexually Active: Yes    Birth Control/ Protection: Condom   Other Topics Concern  . Not on file   Social History Narrative   No regular exercisemarried    ROS: URI symptoms but hemoptysis, dysphasia, odynophagia, melena, hematochezia, dysuria, hematuria, rash, seizure activity, orthopnea, PND, pedal edema, claudication. Remaining systems are negative.  Physical Exam: Well-developed well-nourished in no acute distress.  Skin is warm and dry.  HEENT is normal.  Neck is supple. No thyromegaly.  Chest is clear to auscultation with normal expansion.  Cardiovascular exam is regular rate and rhythm.  Abdominal exam nontender or  distended. No masses palpated. Extremities show no edema. neuro grossly intact  ECG normal sinus rhythm at a rate of 76. No ST changes.

## 2011-03-25 ENCOUNTER — Telehealth: Payer: Self-pay | Admitting: Physician Assistant

## 2011-03-25 MED ORDER — LISINOPRIL 40 MG PO TABS: 40.0000 mg | ORAL_TABLET | Freq: Every day | ORAL | Status: DC

## 2011-03-25 NOTE — Telephone Encounter (Signed)
Pt called because he is out of lisinopril. Refilled it electronically and he should be able to pick it up tomorrow. He will call ofc if this does not work.

## 2011-04-23 ENCOUNTER — Encounter: Payer: Self-pay | Admitting: Internal Medicine

## 2011-04-23 ENCOUNTER — Ambulatory Visit (INDEPENDENT_AMBULATORY_CARE_PROVIDER_SITE_OTHER)
Admission: RE | Admit: 2011-04-23 | Discharge: 2011-04-23 | Disposition: A | Discharge status: Home / Self Care | Payer: Managed Care, Other (non HMO) | Source: Ambulatory Visit | Attending: Internal Medicine | Admitting: Internal Medicine

## 2011-04-23 ENCOUNTER — Ambulatory Visit (INDEPENDENT_AMBULATORY_CARE_PROVIDER_SITE_OTHER): Payer: Managed Care, Other (non HMO) | Admitting: Internal Medicine

## 2011-04-23 VITALS — BP 108/64 | HR 77 | Temp 98.7°F | Resp 16 | Wt 195.8 lb

## 2011-04-23 DIAGNOSIS — R059 Cough, unspecified: Secondary | ICD-10-CM

## 2011-04-23 DIAGNOSIS — R05 Cough: Secondary | ICD-10-CM

## 2011-04-23 DIAGNOSIS — J209 Acute bronchitis, unspecified: Secondary | ICD-10-CM

## 2011-04-23 DIAGNOSIS — Z23 Encounter for immunization: Secondary | ICD-10-CM

## 2011-04-23 MED ORDER — MOXIFLOXACIN HCL 400 MG PO TABS: 400.0000 mg | ORAL_TABLET | Freq: Every day | ORAL | Status: AC

## 2011-04-23 MED ORDER — PSEUDOEPH-CHLORPHEN-HYDROCOD 60-4-5 MG/5ML PO SOLN: 5.0000 mL | Freq: Four times a day (QID) | ORAL | Status: DC | PRN

## 2011-04-23 NOTE — Assessment & Plan Note (Signed)
I will check his CXR to look for pna, mass, edema

## 2011-04-23 NOTE — Progress Notes (Signed)
  Subjective:    Patient ID: Mike Orozco, male    DOB: 10/10/1961, 50 y.o.   MRN: [id number]  Cough This is a new problem. The current episode started in the past 7 days. The problem has been gradually worsening. The problem occurs every few hours. The cough is productive of purulent sputum. Associated symptoms include chills, a fever, nasal congestion, postnasal drip, rhinorrhea, a sore throat, shortness of breath and sweats. Pertinent negatives include no chest pain, ear congestion, ear pain, headaches, heartburn, hemoptysis, myalgias, rash, weight loss or wheezing. The symptoms are aggravated by nothing. He has tried nothing for the symptoms.      Review of Systems  Constitutional: Positive for fever and chills. Negative for weight loss, diaphoresis, activity change, appetite change, fatigue and unexpected weight change.  HENT: Positive for sore throat, rhinorrhea and postnasal drip. Negative for ear pain, facial swelling, trouble swallowing, neck stiffness and voice change.   Eyes: Negative.   Respiratory: Positive for cough and shortness of breath. Negative for hemoptysis, wheezing and stridor.   Cardiovascular: Negative for chest pain, palpitations and leg swelling.  Gastrointestinal: Negative for heartburn, nausea, vomiting, abdominal pain, diarrhea, constipation and blood in stool.  Genitourinary: Negative.   Musculoskeletal: Negative for myalgias, back pain, joint swelling, arthralgias and gait problem.  Skin: Negative for color change, pallor, rash and wound.  Neurological: Negative.  Negative for dizziness, tremors, seizures, syncope, facial asymmetry, speech difficulty, weakness, light-headedness, numbness and headaches.  Hematological: Negative for adenopathy. Does not bruise/bleed easily.  Psychiatric/Behavioral: Negative.        Objective:   Physical Exam  Vitals reviewed. Constitutional: He is oriented to person, place, and time. He appears well-developed and  well-nourished. No distress.  HENT:  Head: Normocephalic and atraumatic.  Mouth/Throat: Oropharynx is clear and moist. No oropharyngeal exudate.  Eyes: Conjunctivae are normal. Right eye exhibits no discharge. Left eye exhibits no discharge. No scleral icterus.  Neck: Normal range of motion. Neck supple. No JVD present. No tracheal deviation present. No thyromegaly present.  Cardiovascular: Normal rate, regular rhythm and intact distal pulses.  Exam reveals no gallop and no friction rub.   No murmur heard. Pulmonary/Chest: Effort normal and breath sounds normal. No stridor. No respiratory distress. He has no wheezes. He has no rales. He exhibits no tenderness.  Abdominal: Soft. Bowel sounds are normal. He exhibits no distension and no mass. There is no tenderness. There is no rebound and no guarding.  Musculoskeletal: Normal range of motion. He exhibits no edema and no tenderness.  Lymphadenopathy:    He has no cervical adenopathy.  Neurological: He is oriented to person, place, and time.  Skin: Skin is warm and dry. No rash noted. He is not diaphoretic. No erythema. No pallor.  Psychiatric: He has a normal mood and affect. His behavior is normal. Judgment and thought content normal.          Assessment & Plan:

## 2011-04-23 NOTE — Patient Instructions (Signed)

## 2011-04-23 NOTE — Assessment & Plan Note (Signed)
Start avelox for the infection and zutripro for the cough and congestion 

## 2011-04-24 ENCOUNTER — Encounter: Payer: Self-pay | Admitting: Internal Medicine

## 2011-04-25 ENCOUNTER — Telehealth: Payer: Self-pay

## 2011-04-25 NOTE — Telephone Encounter (Signed)
yes

## 2011-04-25 NOTE — Telephone Encounter (Signed)
Patient called [facility] c/o continued cough, congestion, and weakness. He states that he tried to go to work today but couldn't stay all day due to feeling bad. Please advise if ok to provide out of work note

## 2011-04-28 NOTE — Telephone Encounter (Signed)
Called pt Mike Orozco to call back 

## 2011-04-30 NOTE — Telephone Encounter (Signed)
Made several attempts to contact pt/ closing phone note until he calls back

## 2011-05-19 ENCOUNTER — Other Ambulatory Visit: Payer: Self-pay | Admitting: Cardiology

## 2011-05-20 HISTORY — PX: COLONOSCOPY: SHX174

## 2011-05-26 ENCOUNTER — Ambulatory Visit (INDEPENDENT_AMBULATORY_CARE_PROVIDER_SITE_OTHER): Payer: Managed Care, Other (non HMO) | Admitting: Internal Medicine

## 2011-05-26 ENCOUNTER — Encounter: Payer: Self-pay | Admitting: Internal Medicine

## 2011-05-26 ENCOUNTER — Other Ambulatory Visit (INDEPENDENT_AMBULATORY_CARE_PROVIDER_SITE_OTHER): Payer: Managed Care, Other (non HMO)

## 2011-05-26 DIAGNOSIS — E781 Pure hyperglyceridemia: Secondary | ICD-10-CM

## 2011-05-26 DIAGNOSIS — M109 Gout, unspecified: Secondary | ICD-10-CM

## 2011-05-26 DIAGNOSIS — D649 Anemia, unspecified: Secondary | ICD-10-CM

## 2011-05-26 DIAGNOSIS — I10 Essential (primary) hypertension: Secondary | ICD-10-CM

## 2011-05-26 LAB — CBC WITH DIFFERENTIAL/PLATELET
Basophils Absolute: 0.1 10*3/uL (ref 0.0–0.1)
Eosinophils Absolute: 0.2 10*3/uL (ref 0.0–0.7)
MCHC: 33.5 g/dL (ref 30.0–36.0)
MCV: 92.7 fl (ref 78.0–100.0)
Monocytes Absolute: 0.4 10*3/uL (ref 0.1–1.0)
Neutrophils Relative %: 63.4 % (ref 43.0–77.0)
Platelets: 227 10*3/uL (ref 150.0–400.0)

## 2011-05-26 MED ORDER — FENOFIBRATE 150 MG PO CAPS: 1.0000 | ORAL_CAPSULE | Freq: Every day | ORAL | Status: DC

## 2011-05-26 NOTE — Progress Notes (Signed)
Subjective:    Patient ID: Mike Orozco, male    DOB: 10/10/1961, 51 y.o.   MRN: [id number]  Hyperlipidemia This is a chronic problem. The current episode started more than 1 year ago. The problem is controlled. Recent lipid tests were reviewed and are variable. Exacerbating diseases include obesity. He has no history of chronic renal disease, diabetes, hypothyroidism, liver disease or nephrotic syndrome. Factors aggravating his hyperlipidemia include no known factors. Pertinent negatives include no chest pain, focal sensory loss, focal weakness, leg pain, myalgias or shortness of breath. Current antihyperlipidemic treatment includes statins. The current treatment provides moderate improvement of lipids. Compliance problems include adherence to exercise, adherence to diet, psychosocial issues and medication cost.   Hypertension This is a chronic problem. The current episode started more than 1 year ago. The problem has been gradually improving since onset. The problem is controlled. Pertinent negatives include no anxiety, blurred vision, chest pain, headaches, malaise/fatigue, neck pain, orthopnea, palpitations, peripheral edema, PND, shortness of breath or sweats. There are no associated agents to hypertension. Past treatments include beta blockers and ACE inhibitors. The current treatment provides significant improvement. Compliance problems include exercise and diet.  There is no history of chronic renal disease.      Review of Systems  Constitutional: Negative for fever, chills, malaise/fatigue, diaphoresis, activity change, appetite change, fatigue and unexpected weight change.  HENT: Negative.  Negative for neck pain.   Eyes: Negative.  Negative for blurred vision.  Respiratory: Negative for chest tightness, shortness of breath, wheezing and stridor.   Cardiovascular: Negative for chest pain, palpitations, orthopnea, leg swelling and PND.  Gastrointestinal: Negative for nausea, vomiting,  abdominal pain, diarrhea and constipation.  Genitourinary: Negative for dysuria, urgency, frequency, hematuria, decreased urine volume, enuresis and difficulty urinating.  Musculoskeletal: Negative for myalgias, back pain, joint swelling, arthralgias and gait problem.  Skin: Negative for color change, pallor, rash and wound.  Neurological: Negative for dizziness, tremors, focal weakness, seizures, syncope, facial asymmetry, speech difficulty, weakness, light-headedness, numbness and headaches.  Hematological: Negative for adenopathy. Does not bruise/bleed easily.  Psychiatric/Behavioral: Negative.        Objective:   Physical Exam  Vitals reviewed. Constitutional: He is oriented to person, place, and time. He appears well-developed and well-nourished. No distress.  HENT:  Head: Normocephalic and atraumatic.  Mouth/Throat: Oropharynx is clear and moist. No oropharyngeal exudate.  Eyes: Conjunctivae are normal. Right eye exhibits no discharge. Left eye exhibits no discharge. No scleral icterus.  Neck: Normal range of motion. Neck supple. No JVD present. No tracheal deviation present. No thyromegaly present.  Cardiovascular: Normal rate, regular rhythm, normal heart sounds and intact distal pulses.  Exam reveals no gallop and no friction rub.   No murmur heard. Pulmonary/Chest: Effort normal and breath sounds normal. No stridor. No respiratory distress. He has no wheezes. He has no rales. He exhibits no tenderness.  Abdominal: Soft. Bowel sounds are normal. He exhibits no distension and no mass. There is no tenderness. There is no rebound and no guarding.  Musculoskeletal: Normal range of motion. He exhibits no edema and no tenderness.  Lymphadenopathy:    He has no cervical adenopathy.  Neurological: He is oriented to person, place, and time.  Skin: Skin is warm and dry. No rash noted. He is not diaphoretic. No erythema. No pallor.  Psychiatric: He has a normal mood and affect. His behavior  is normal. Judgment and thought content normal.      Lab Results  Component Value Date   WBC  4.6 02/05/2011   HGB 10.8* 02/05/2011   HCT 31.3* 02/05/2011   PLT 264 02/05/2011   GLUCOSE 141* 02/05/2011   CHOL 125 01/15/2011   TRIG 247.0* 01/15/2011   HDL 36.90* 01/15/2011   LDLDIRECT 64.5 01/15/2011   LDLCALC  Value: 98        Total Cholesterol/HDL:CHD Risk Coronary Heart Disease Risk Table                     Men   Women  1/2 Average Risk   3.4   3.3  Average Risk       5.0   4.4  2 X Average Risk   9.6   7.1  3 X Average Risk  23.4   11.0        Use the calculated Patient Ratio above and the CHD Risk Table to determine the patient's CHD Risk.        ATP III CLASSIFICATION (LDL):  <100     mg/dL   Optimal  161-096  mg/dL   Near or Above                    Optimal  130-159  mg/dL   Borderline  045-409  mg/dL   High  >811     mg/dL   Very High 91/08/7827   ALT 29 02/05/2011   AST 24 02/05/2011   NA 140 02/05/2011   K 3.5 02/05/2011   CL 104 02/05/2011   CREATININE 1.06 02/05/2011   BUN 15 02/05/2011   CO2 25 02/05/2011   TSH 1.68 01/15/2011   PSA 0.91 06/17/2010   INR 1.11 02/05/2011     Assessment & Plan:

## 2011-05-26 NOTE — Patient Instructions (Signed)
Hypertriglyceridemia  Diet for High blood levels of Triglycerides Most fats in food are triglycerides. Triglycerides in your blood are stored as fat in your body. High levels of triglycerides in your blood may put you at a greater risk for heart disease and stroke.  Normal triglyceride levels are less than 150 mg/dL. Borderline high levels are 150-199 mg/dl. High levels are 200 - 499 mg/dL, and very high triglyceride levels are greater than 500 mg/dL. The decision to treat high triglycerides is generally based on the level. For people with borderline or high triglyceride levels, treatment includes weight loss and exercise. Drugs are recommended for people with very high triglyceride levels. Many people who need treatment for high triglyceride levels have metabolic syndrome. This syndrome is a collection of disorders that often include: insulin resistance, high blood pressure, blood clotting problems, high cholesterol and triglycerides. TESTING PROCEDURE FOR TRIGLYCERIDES  You should not eat 4 hours before getting your triglycerides measured. The normal range of triglycerides is between 10 and 250 milligrams per deciliter (mg/dl). Some people may have extreme levels (1000 or above), but your triglyceride level may be too high if it is above 150 mg/dl, depending on what other risk factors you have for heart disease.   People with high blood triglycerides may also have high blood cholesterol levels. If you have high blood cholesterol as well as high blood triglycerides, your risk for heart disease is probably greater than if you only had high triglycerides. High blood cholesterol is one of the main risk factors for heart disease.  CHANGING YOUR DIET  Your weight can affect your blood triglyceride level. If you are more than 20% above your ideal body weight, you may be able to lower your blood triglycerides by losing weight. Eating less and exercising regularly is the best way to combat this. Fat provides  more calories than any other food. The best way to lose weight is to eat less fat. Only 30% of your total calories should come from fat. Less than 7% of your diet should come from saturated fat. A diet low in fat and saturated fat is the same as a diet to decrease blood cholesterol. By eating a diet lower in fat, you may lose weight, lower your blood cholesterol, and lower your blood triglyceride level.  Eating a diet low in fat, especially saturated fat, may also help you lower your blood triglyceride level. Ask your dietitian to help you figure how much fat you can eat based on the number of calories your caregiver has prescribed for you.  Exercise, in addition to helping with weight loss may also help lower triglyceride levels.   Alcohol can increase blood triglycerides. You may need to stop drinking alcoholic beverages.   Too much carbohydrate in your diet may also increase your blood triglycerides. Some complex carbohydrates are necessary in your diet. These may include bread, rice, potatoes, other starchy vegetables and cereals.   Reduce "simple" carbohydrates. These may include pure sugars, candy, honey, and jelly without losing other nutrients. If you have the kind of high blood triglycerides that is affected by the amount of carbohydrates in your diet, you will need to eat less sugar and less high-sugar foods. Your caregiver can help you with this.   Adding 2-4 grams of fish oil (EPA+ DHA) may also help lower triglycerides. Speak with your caregiver before adding any supplements to your regimen.  Following the Diet  Maintain your ideal weight. Your caregivers can help you with a diet. Generally,   eating less food and getting more exercise will help you lose weight. Joining a weight control group may also help. Ask your caregivers for a good weight control group in your area.  Eat low-fat foods instead of high-fat foods. This can help you lose weight too.  These foods are lower in fat. Eat MORE  of these:   Dried beans, peas, and lentils.   Egg whites.   Low-fat cottage cheese.   Fish.   Lean cuts of meat, such as round, sirloin, rump, and flank (cut extra fat off meat you fix).   Whole grain breads, cereals and pasta.   Skim and nonfat dry milk.   Low-fat yogurt.   Poultry without the skin.   Cheese made with skim or part-skim milk, such as mozzarella, parmesan, farmers', ricotta, or pot cheese.  These are higher fat foods. Eat LESS of these:   Whole milk and foods made from whole milk, such as American, blue, cheddar, monterey jack, and swiss cheese   High-fat meats, such as luncheon meats, sausages, knockwurst, bratwurst, hot dogs, ribs, corned beef, ground pork, and regular ground beef.   Fried foods.  Limit saturated fats in your diet. Substituting unsaturated fat for saturated fat may decrease your blood triglyceride level. You will need to read package labels to know which products contain saturated fats.  These foods are high in saturated fat. Eat LESS of these:   Fried pork skins.   Whole milk.   Skin and fat from poultry.   Palm oil.   Butter.   Shortening.   Cream cheese.   Bacon.   Margarines and baked goods made from listed oils.   Vegetable shortenings.   Chitterlings.   Fat from meats.   Coconut oil.   Palm kernel oil.   Lard.   Cream.   Sour cream.   Fatback.   Coffee whiteners and non-dairy creamers made with these oils.   Cheese made from whole milk.  Use unsaturated fats (both polyunsaturated and monounsaturated) moderately. Remember, even though unsaturated fats are better than saturated fats; you still want a diet low in total fat.  These foods are high in unsaturated fat:   Canola oil.   Sunflower oil.   Mayonnaise.   Almonds.   Peanuts.   Pine nuts.   Margarines made with these oils.   Safflower oil.   Olive oil.   Avocados.   Cashews.   Peanut butter.   Sunflower seeds.   Soybean oil.     Peanut oil.   Olives.   Pecans.   Walnuts.   Pumpkin seeds.  Avoid sugar and other high-sugar foods. This will decrease carbohydrates without decreasing other nutrients. Sugar in your food goes rapidly to your blood. When there is excess sugar in your blood, your liver may use it to make more triglycerides. Sugar also contains calories without other important nutrients.  Eat LESS of these:   Sugar, brown sugar, powdered sugar, jam, jelly, preserves, honey, syrup, molasses, pies, candy, cakes, cookies, frosting, pastries, colas, soft drinks, punches, fruit drinks, and regular gelatin.   Avoid alcohol. Alcohol, even more than sugar, may increase blood triglycerides. In addition, alcohol is high in calories and low in nutrients. Ask for sparkling water, or a diet soft drink instead of an alcoholic beverage.  Suggestions for planning and preparing meals   Bake, broil, grill or roast meats instead of frying.   Remove fat from meats and skin from poultry before cooking.   Add spices,   herbs, lemon juice or vinegar to vegetables instead of salt, rich sauces or gravies.   Use a non-stick skillet without fat or use no-stick sprays.   Cool and refrigerate stews and broth. Then remove the hardened fat floating on the surface before serving.   Refrigerate meat drippings and skim off fat to make low-fat gravies.   Serve more fish.   Use less butter, margarine and other high-fat spreads on bread or vegetables.   Use skim or reconstituted non-fat dry milk for cooking.   Cook with low-fat cheeses.   Substitute low-fat yogurt or cottage cheese for all or part of the sour cream in recipes for sauces, dips or congealed salads.   Use half yogurt/half mayonnaise in salad recipes.   Substitute evaporated skim milk for cream. Evaporated skim milk or reconstituted non-fat dry milk can be whipped and substituted for whipped cream in certain recipes.   Choose fresh fruits for dessert instead of  high-fat foods such as pies or cakes. Fruits are naturally low in fat.  When Dining Out   Order low-fat appetizers such as fruit or vegetable juice, pasta with vegetables or tomato sauce.   Select clear, rather than cream soups.   Ask that dressings and gravies be served on the side. Then use less of them.   Order foods that are baked, broiled, poached, steamed, stir-fried, or roasted.   Ask for margarine instead of butter, and use only a small amount.   Drink sparkling water, unsweetened tea or coffee, or diet soft drinks instead of alcohol or other sweet beverages.  QUESTIONS AND ANSWERS ABOUT OTHER FATS IN THE BLOOD: SATURATED FAT, TRANS FAT, AND CHOLESTEROL What is trans fat? Trans fat is a type of fat that is formed when vegetable oil is hardened through a process called hydrogenation. This process helps makes foods more solid, gives them shape, and prolongs their shelf life. Trans fats are also called hydrogenated or partially hydrogenated oils.  What do saturated fat, trans fat, and cholesterol in foods have to do with heart disease? Saturated fat, trans fat, and cholesterol in the diet all raise the level of LDL "bad" cholesterol in the blood. The higher the LDL cholesterol, the greater the risk for coronary heart disease (CHD). Saturated fat and trans fat raise LDL similarly.  What foods contain saturated fat, trans fat, and cholesterol? High amounts of saturated fat are found in animal products, such as fatty cuts of meat, chicken skin, and full-fat dairy products like butter, whole milk, cream, and cheese, and in tropical vegetable oils such as palm, palm kernel, and coconut oil. Trans fat is found in some of the same foods as saturated fat, such as vegetable shortening, some margarines (especially hard or stick margarine), crackers, cookies, baked goods, fried foods, salad dressings, and other processed foods made with partially hydrogenated vegetable oils. Small amounts of trans fat  also occur naturally in some animal products, such as milk products, beef, and lamb. Foods high in cholesterol include liver, other organ meats, egg yolks, shrimp, and full-fat dairy products. How can I use the new food label to make heart-healthy food choices? Check the Nutrition Facts panel of the food label. Choose foods lower in saturated fat, trans fat, and cholesterol. For saturated fat and cholesterol, you can also use the Percent Daily Value (%DV): 5% DV or less is low, and 20% DV or more is high. (There is no %DV for trans fat.) Use the Nutrition Facts panel to choose foods low in   saturated fat and cholesterol, and if the trans fat is not listed, read the ingredients and limit products that list shortening or hydrogenated or partially hydrogenated vegetable oil, which tend to be high in trans fat. POINTS TO REMEMBER: YOU NEED A LITTLE TLC (THERAPEUTIC LIFESTYLE CHANGES)  Discuss your risk for heart disease with your caregivers, and take steps to reduce risk factors.   Change your diet. Choose foods that are low in saturated fat, trans fat, and cholesterol.   Add exercise to your daily routine if it is not already being done. Participate in physical activity of moderate intensity, like brisk walking, for at least 30 minutes on most, and preferably all days of the week. No time? Break the 30 minutes into three, 10-minute segments during the day.   Stop smoking. If you do smoke, contact your caregiver to discuss ways in which they can help you quit.   Do not use street drugs.   Maintain a normal weight.   Maintain a healthy blood pressure.   Keep up with your blood work for checking the fats in your blood as directed by your caregiver.  Document Released: 02/21/2004 Document Revised: 01/15/2011 Document Reviewed: 09/18/2008 [employer] Patient Information 2012 [employer]. 

## 2011-05-26 NOTE — Assessment & Plan Note (Signed)
I have asked him to be more diligent about compliance with his meds and lifestyle modifications, today I will check his FLP and CMP

## 2011-05-26 NOTE — Assessment & Plan Note (Signed)
His BP is well controlled, I will check his lytes today 

## 2011-05-26 NOTE — Assessment & Plan Note (Signed)
I will look at his uric acid level and if it is >6 I will ask him to start a med to lower the level and reduce the risk of gout flares

## 2011-05-26 NOTE — Assessment & Plan Note (Signed)
He has no s/s of blood loss, I will check his CBC today

## 2011-05-27 LAB — COMPREHENSIVE METABOLIC PANEL
AST: 22 U/L (ref 0–37)
Albumin: 4.1 g/dL (ref 3.5–5.2)
Alkaline Phosphatase: 57 U/L (ref 39–117)
Potassium: 3.7 mEq/L (ref 3.5–5.1)
Sodium: 143 mEq/L (ref 135–145)
Total Protein: 7.2 g/dL (ref 6.0–8.3)

## 2011-05-28 ENCOUNTER — Encounter: Payer: Self-pay | Admitting: Internal Medicine

## 2011-05-28 DIAGNOSIS — M109 Gout, unspecified: Secondary | ICD-10-CM | POA: Insufficient documentation

## 2011-05-28 MED ORDER — FEBUXOSTAT 40 MG PO TABS: 40.0000 mg | ORAL_TABLET | Freq: Every day | ORAL | Status: DC

## 2011-05-28 NOTE — Assessment & Plan Note (Signed)
Start uloric to prevent future attacks of gout

## 2011-05-28 NOTE — Progress Notes (Signed)
Addended by: Etta Grandchild on: 05/28/2011 08:25 AM   Modules accepted: Orders

## 2011-06-02 ENCOUNTER — Telehealth: Payer: Self-pay

## 2011-06-02 NOTE — Telephone Encounter (Signed)
Patient called [facility] requesting a call back. Spoke with patient and advised on lab results per pt

## 2011-07-21 ENCOUNTER — Ambulatory Visit: Payer: Managed Care, Other (non HMO) | Admitting: Internal Medicine

## 2011-07-21 DIAGNOSIS — Z0289 Encounter for other administrative examinations: Secondary | ICD-10-CM

## 2011-07-28 ENCOUNTER — Encounter: Payer: Self-pay | Admitting: Internal Medicine

## 2011-07-28 ENCOUNTER — Ambulatory Visit (INDEPENDENT_AMBULATORY_CARE_PROVIDER_SITE_OTHER): Payer: Managed Care, Other (non HMO) | Admitting: Internal Medicine

## 2011-07-28 ENCOUNTER — Other Ambulatory Visit (INDEPENDENT_AMBULATORY_CARE_PROVIDER_SITE_OTHER): Payer: Managed Care, Other (non HMO)

## 2011-07-28 VITALS — BP 120/77 | HR 61 | Temp 98.2°F | Resp 16 | Wt 197.0 lb

## 2011-07-28 DIAGNOSIS — E781 Pure hyperglyceridemia: Secondary | ICD-10-CM

## 2011-07-28 DIAGNOSIS — D649 Anemia, unspecified: Secondary | ICD-10-CM

## 2011-07-28 DIAGNOSIS — I10 Essential (primary) hypertension: Secondary | ICD-10-CM

## 2011-07-28 DIAGNOSIS — M109 Gout, unspecified: Secondary | ICD-10-CM

## 2011-07-28 DIAGNOSIS — R7309 Other abnormal glucose: Secondary | ICD-10-CM

## 2011-07-28 LAB — BASIC METABOLIC PANEL
CO2: 28 mEq/L (ref 19–32)
Chloride: 105 mEq/L (ref 96–112)
Creatinine, Ser: 1.6 mg/dL — ABNORMAL HIGH (ref 0.4–1.5)
Potassium: 4.3 mEq/L (ref 3.5–5.1)
Sodium: 137 mEq/L (ref 135–145)

## 2011-07-28 LAB — CBC WITH DIFFERENTIAL/PLATELET
Basophils Relative: 0.9 % (ref 0.0–3.0)
Eosinophils Relative: 1.9 % (ref 0.0–5.0)
Lymphocytes Relative: 35.2 % (ref 12.0–46.0)
MCV: 92.2 fl (ref 78.0–100.0)
Monocytes Relative: 6.3 % (ref 3.0–12.0)
Neutrophils Relative %: 55.7 % (ref 43.0–77.0)
RBC: 3.53 Mil/uL — ABNORMAL LOW (ref 4.22–5.81)
WBC: 4.9 10*3/uL (ref 4.5–10.5)

## 2011-07-28 LAB — VITAMIN B12: Vitamin B-12: 429 pg/mL (ref 211–911)

## 2011-07-28 LAB — HEMOGLOBIN A1C: Hgb A1c MFr Bld: 5.5 % (ref 4.6–6.5)

## 2011-07-28 LAB — IBC PANEL: Saturation Ratios: 28 % (ref 20.0–50.0)

## 2011-07-28 LAB — FECAL OCCULT BLOOD, GUAIAC: Fecal Occult Blood: NEGATIVE

## 2011-07-28 IMAGING — CR DG HIP COMPLETE 2+V*R*
3 series · 3 of 3 positions shown · non-contrast
Comparison: None.

CLINICAL DATA: Right hip pain.

RIGHT HIP - COMPLETE 2+ VIEW

[view not recorded (1 of 3)]
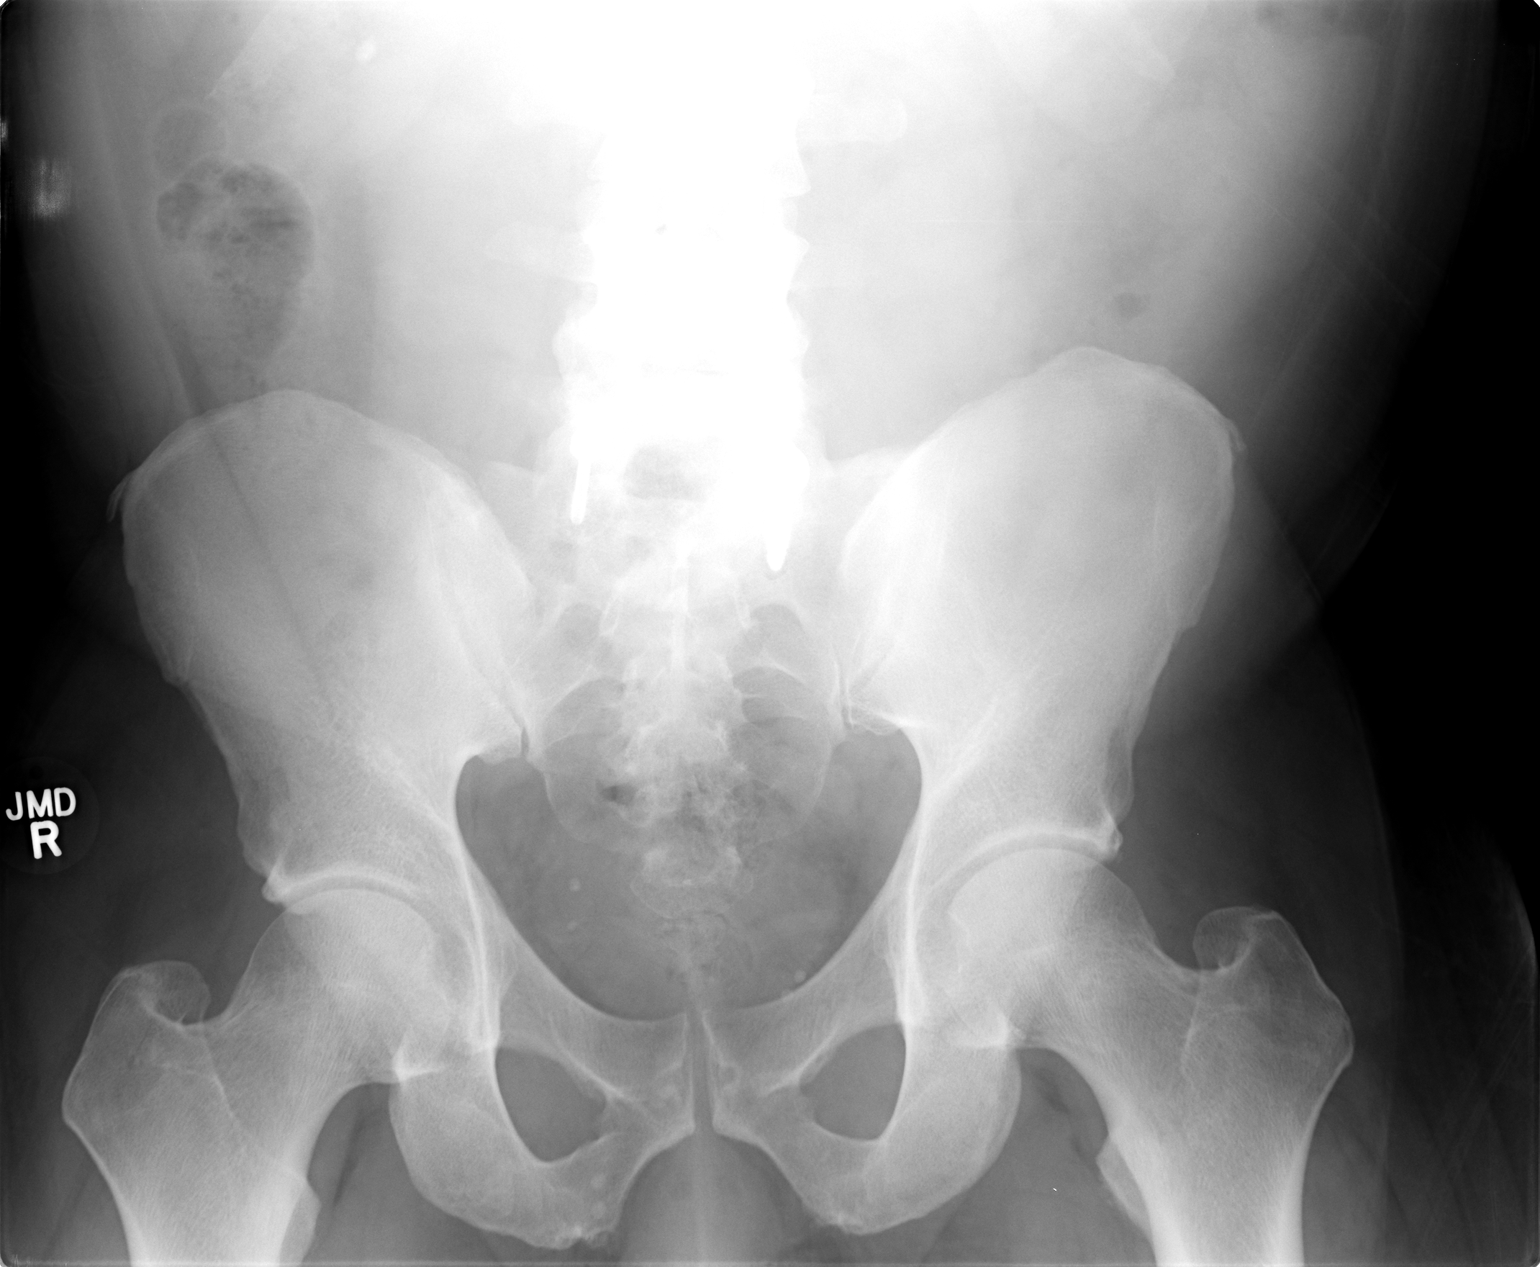

[view not recorded (2 of 3)]
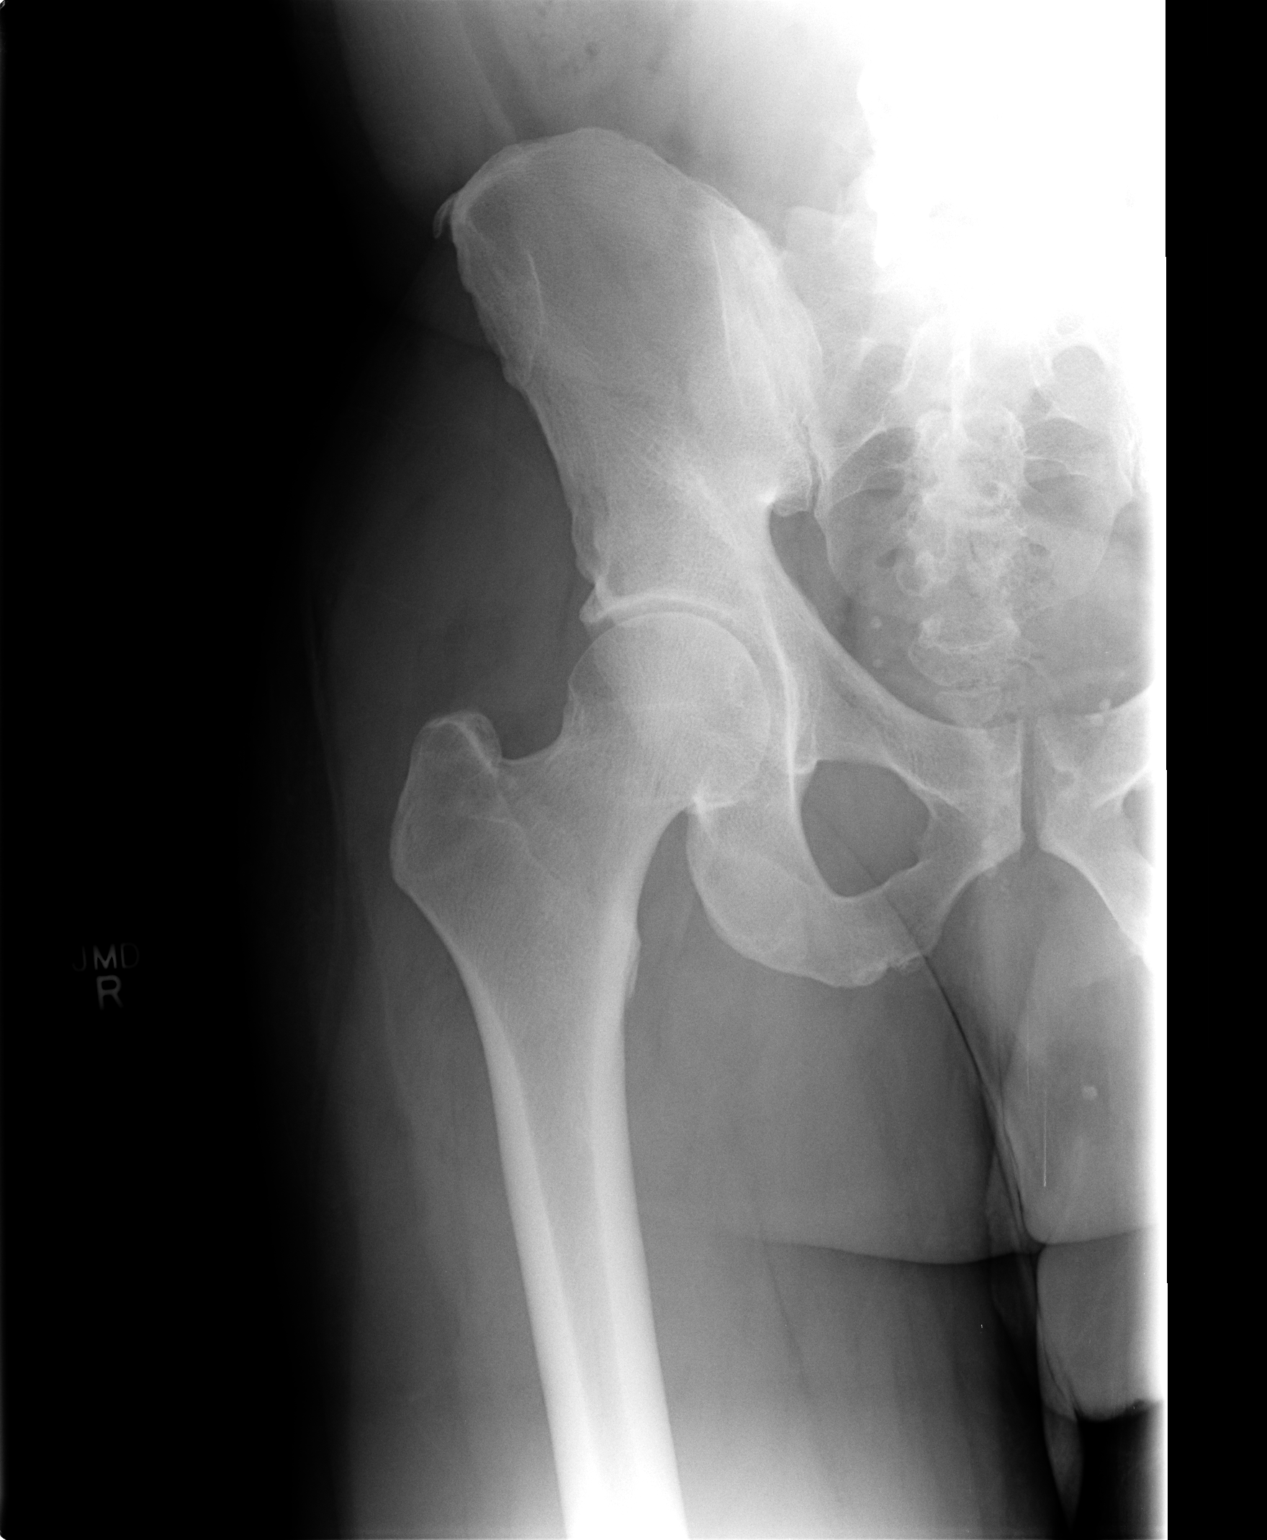

[view not recorded (3 of 3)]
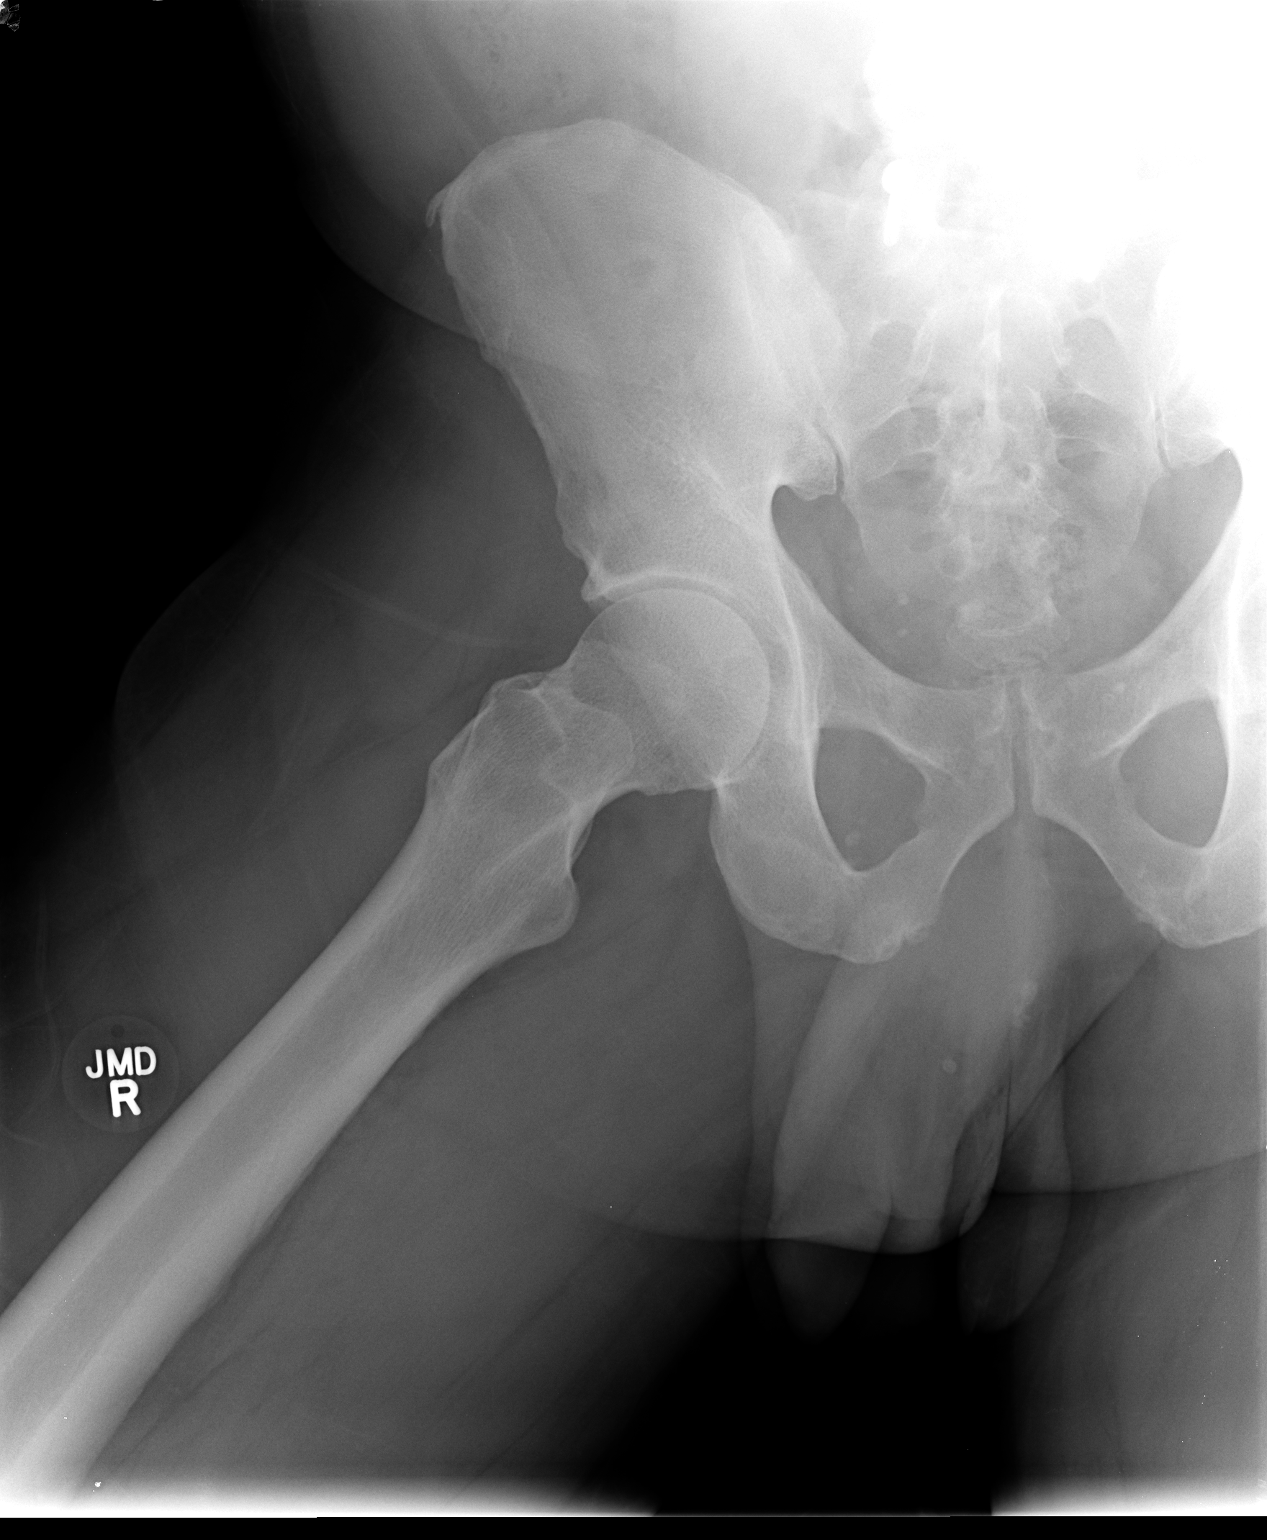

[3 of 3 positions shown; findings below may reference images not displayed]

FINDINGS: Hip joint space is maintained bilaterally.  No remarkable
degenerative changes in the right hip.  Postop changes in the
lumbosacral spine.
IMPRESSION: No acute findings.  No remarkable degenerative changes in the right
hip.

## 2011-07-28 MED ORDER — FEBUXOSTAT 40 MG PO TABS: 40.0000 mg | ORAL_TABLET | Freq: Every day | ORAL | Status: DC

## 2011-07-28 NOTE — Assessment & Plan Note (Signed)
He has no s/s of blood loss, today I will recheck his CBC and his vitamin levels and I have asked him to get a colonoscopy done

## 2011-07-28 NOTE — Assessment & Plan Note (Signed)
I have asked him to see a nutritionist

## 2011-07-28 NOTE — Assessment & Plan Note (Addendum)
His BP is well controlled, I will check his lytes and renal function today 

## 2011-07-28 NOTE — Patient Instructions (Addendum)
Hypertriglyceridemia  Diet for High blood levels of Triglycerides Most fats in food are triglycerides. Triglycerides in your blood are stored as fat in your body. High levels of triglycerides in your blood may put you at a greater risk for heart disease and stroke.  Normal triglyceride levels are less than 150 mg/dL. Borderline high levels are 150-199 mg/dl. High levels are 200 - 499 mg/dL, and very high triglyceride levels are greater than 500 mg/dL. The decision to treat high triglycerides is generally based on the level. For people with borderline or high triglyceride levels, treatment includes weight loss and exercise. Drugs are recommended for people with very high triglyceride levels. Many people who need treatment for high triglyceride levels have metabolic syndrome. This syndrome is a collection of disorders that often include: insulin resistance, high blood pressure, blood clotting problems, high cholesterol and triglycerides. TESTING PROCEDURE FOR TRIGLYCERIDES  You should not eat 4 hours before getting your triglycerides measured. The normal range of triglycerides is between 10 and 250 milligrams per deciliter (mg/dl). Some people may have extreme levels (1000 or above), but your triglyceride level may be too high if it is above 150 mg/dl, depending on what other risk factors you have for heart disease.   People with high blood triglycerides may also have high blood cholesterol levels. If you have high blood cholesterol as well as high blood triglycerides, your risk for heart disease is probably greater than if you only had high triglycerides. High blood cholesterol is one of the main risk factors for heart disease.  CHANGING YOUR DIET  Your weight can affect your blood triglyceride level. If you are more than 20% above your ideal body weight, you may be able to lower your blood triglycerides by losing weight. Eating less and exercising regularly is the best way to combat this. Fat provides  more calories than any other food. The best way to lose weight is to eat less fat. Only 30% of your total calories should come from fat. Less than 7% of your diet should come from saturated fat. A diet low in fat and saturated fat is the same as a diet to decrease blood cholesterol. By eating a diet lower in fat, you may lose weight, lower your blood cholesterol, and lower your blood triglyceride level.  Eating a diet low in fat, especially saturated fat, may also help you lower your blood triglyceride level. Ask your dietitian to help you figure how much fat you can eat based on the number of calories your caregiver has prescribed for you.  Exercise, in addition to helping with weight loss may also help lower triglyceride levels.   Alcohol can increase blood triglycerides. You may need to stop drinking alcoholic beverages.   Too much carbohydrate in your diet may also increase your blood triglycerides. Some complex carbohydrates are necessary in your diet. These may include bread, rice, potatoes, other starchy vegetables and cereals.   Reduce "simple" carbohydrates. These may include pure sugars, candy, honey, and jelly without losing other nutrients. If you have the kind of high blood triglycerides that is affected by the amount of carbohydrates in your diet, you will need to eat less sugar and less high-sugar foods. Your caregiver can help you with this.   Adding 2-4 grams of fish oil (EPA+ DHA) may also help lower triglycerides. Speak with your caregiver before adding any supplements to your regimen.  Following the Diet  Maintain your ideal weight. Your caregivers can help you with a diet. Generally,   eating less food and getting more exercise will help you lose weight. Joining a weight control group may also help. Ask your caregivers for a good weight control group in your area.  Eat low-fat foods instead of high-fat foods. This can help you lose weight too.  These foods are lower in fat. Eat MORE  of these:   Dried beans, peas, and lentils.   Egg whites.   Low-fat cottage cheese.   Fish.   Lean cuts of meat, such as round, sirloin, rump, and flank (cut extra fat off meat you fix).   Whole grain breads, cereals and pasta.   Skim and nonfat dry milk.   Low-fat yogurt.   Poultry without the skin.   Cheese made with skim or part-skim milk, such as mozzarella, parmesan, farmers', ricotta, or pot cheese.  These are higher fat foods. Eat LESS of these:   Whole milk and foods made from whole milk, such as American, blue, cheddar, monterey jack, and swiss cheese   High-fat meats, such as luncheon meats, sausages, knockwurst, bratwurst, hot dogs, ribs, corned beef, ground pork, and regular ground beef.   Fried foods.  Limit saturated fats in your diet. Substituting unsaturated fat for saturated fat may decrease your blood triglyceride level. You will need to read package labels to know which products contain saturated fats.  These foods are high in saturated fat. Eat LESS of these:   Fried pork skins.   Whole milk.   Skin and fat from poultry.   Palm oil.   Butter.   Shortening.   Cream cheese.   Bacon.   Margarines and baked goods made from listed oils.   Vegetable shortenings.   Chitterlings.   Fat from meats.   Coconut oil.   Palm kernel oil.   Lard.   Cream.   Sour cream.   Fatback.   Coffee whiteners and non-dairy creamers made with these oils.   Cheese made from whole milk.  Use unsaturated fats (both polyunsaturated and monounsaturated) moderately. Remember, even though unsaturated fats are better than saturated fats; you still want a diet low in total fat.  These foods are high in unsaturated fat:   Canola oil.   Sunflower oil.   Mayonnaise.   Almonds.   Peanuts.   Pine nuts.   Margarines made with these oils.   Safflower oil.   Olive oil.   Avocados.   Cashews.   Peanut butter.   Sunflower seeds.   Soybean oil.     Peanut oil.   Olives.   Pecans.   Walnuts.   Pumpkin seeds.  Avoid sugar and other high-sugar foods. This will decrease carbohydrates without decreasing other nutrients. Sugar in your food goes rapidly to your blood. When there is excess sugar in your blood, your liver may use it to make more triglycerides. Sugar also contains calories without other important nutrients.  Eat LESS of these:   Sugar, brown sugar, powdered sugar, jam, jelly, preserves, honey, syrup, molasses, pies, candy, cakes, cookies, frosting, pastries, colas, soft drinks, punches, fruit drinks, and regular gelatin.   Avoid alcohol. Alcohol, even more than sugar, may increase blood triglycerides. In addition, alcohol is high in calories and low in nutrients. Ask for sparkling water, or a diet soft drink instead of an alcoholic beverage.  Suggestions for planning and preparing meals   Bake, broil, grill or roast meats instead of frying.   Remove fat from meats and skin from poultry before cooking.   Add spices,   herbs, lemon juice or vinegar to vegetables instead of salt, rich sauces or gravies.   Use a non-stick skillet without fat or use no-stick sprays.   Cool and refrigerate stews and broth. Then remove the hardened fat floating on the surface before serving.   Refrigerate meat drippings and skim off fat to make low-fat gravies.   Serve more fish.   Use less butter, margarine and other high-fat spreads on bread or vegetables.   Use skim or reconstituted non-fat dry milk for cooking.   Cook with low-fat cheeses.   Substitute low-fat yogurt or cottage cheese for all or part of the sour cream in recipes for sauces, dips or congealed salads.   Use half yogurt/half mayonnaise in salad recipes.   Substitute evaporated skim milk for cream. Evaporated skim milk or reconstituted non-fat dry milk can be whipped and substituted for whipped cream in certain recipes.   Choose fresh fruits for dessert instead of  high-fat foods such as pies or cakes. Fruits are naturally low in fat.  When Dining Out   Order low-fat appetizers such as fruit or vegetable juice, pasta with vegetables or tomato sauce.   Select clear, rather than cream soups.   Ask that dressings and gravies be served on the side. Then use less of them.   Order foods that are baked, broiled, poached, steamed, stir-fried, or roasted.   Ask for margarine instead of butter, and use only a small amount.   Drink sparkling water, unsweetened tea or coffee, or diet soft drinks instead of alcohol or other sweet beverages.  QUESTIONS AND ANSWERS ABOUT OTHER FATS IN THE BLOOD: SATURATED FAT, TRANS FAT, AND CHOLESTEROL What is trans fat? Trans fat is a type of fat that is formed when vegetable oil is hardened through a process called hydrogenation. This process helps makes foods more solid, gives them shape, and prolongs their shelf life. Trans fats are also called hydrogenated or partially hydrogenated oils.  What do saturated fat, trans fat, and cholesterol in foods have to do with heart disease? Saturated fat, trans fat, and cholesterol in the diet all raise the level of LDL "bad" cholesterol in the blood. The higher the LDL cholesterol, the greater the risk for coronary heart disease (CHD). Saturated fat and trans fat raise LDL similarly.  What foods contain saturated fat, trans fat, and cholesterol? High amounts of saturated fat are found in animal products, such as fatty cuts of meat, chicken skin, and full-fat dairy products like butter, whole milk, cream, and cheese, and in tropical vegetable oils such as palm, palm kernel, and coconut oil. Trans fat is found in some of the same foods as saturated fat, such as vegetable shortening, some margarines (especially hard or stick margarine), crackers, cookies, baked goods, fried foods, salad dressings, and other processed foods made with partially hydrogenated vegetable oils. Small amounts of trans fat  also occur naturally in some animal products, such as milk products, beef, and lamb. Foods high in cholesterol include liver, other organ meats, egg yolks, shrimp, and full-fat dairy products. How can I use the new food label to make heart-healthy food choices? Check the Nutrition Facts panel of the food label. Choose foods lower in saturated fat, trans fat, and cholesterol. For saturated fat and cholesterol, you can also use the Percent Daily Value (%DV): 5% DV or less is low, and 20% DV or more is high. (There is no %DV for trans fat.) Use the Nutrition Facts panel to choose foods low in   saturated fat and cholesterol, and if the trans fat is not listed, read the ingredients and limit products that list shortening or hydrogenated or partially hydrogenated vegetable oil, which tend to be high in trans fat. POINTS TO REMEMBER: YOU NEED A LITTLE TLC (THERAPEUTIC LIFESTYLE CHANGES)  Discuss your risk for heart disease with your caregivers, and take steps to reduce risk factors.   Change your diet. Choose foods that are low in saturated fat, trans fat, and cholesterol.   Add exercise to your daily routine if it is not already being done. Participate in physical activity of moderate intensity, like brisk walking, for at least 30 minutes on most, and preferably all days of the week. No time? Break the 30 minutes into three, 10-minute segments during the day.   Stop smoking. If you do smoke, contact your caregiver to discuss ways in which they can help you quit.   Do not use street drugs.   Maintain a normal weight.   Maintain a healthy blood pressure.   Keep up with your blood work for checking the fats in your blood as directed by your caregiver.  Document Released: 02/21/2004 Document Revised: 04/24/2011 Document Reviewed: 09/18/2008 [facility] Patient Information 2012 [place], [place].Anemia, Nonspecific Your exam and blood tests show you are anemic. This means your blood (hemoglobin) level is low.  Normal hemoglobin values are 12 to 15 g/dL for females and 14 to 17 g/dL for males. Make a note of your hemoglobin level today. The hematocrit percent is also used to measure anemia. A normal hematocrit is 38% to 46% in females and 42% to 49% in males. Make a note of your hematocrit level today. CAUSES  Anemia can be due to many different causes.  Excessive bleeding from periods (in women).   Intestinal bleeding.   Poor nutrition.   Kidney, thyroid, liver, and bone marrow diseases.  SYMPTOMS  Anemia can come on suddenly (acute). It can also come on slowly. Symptoms can include:  Minor weakness.   Dizziness.   Palpitations.   Shortness of breath.  Symptoms may be absent until half your hemoglobin is missing if it comes on slowly. Anemia due to acute blood loss from an injury or internal bleeding may require blood transfusion if the loss is severe. Hospital care is needed if you are anemic and there is significant continual blood loss. TREATMENT   Stool tests for blood (Hemoccult) and additional lab tests are often needed. This determines the best treatment.   Further checking on your condition and your response to treatment is very important. It often takes many weeks to correct anemia.  Depending on the cause, treatment can include:  Supplements of iron.   Vitamins B12 and folic acid.   Hormone medicines.If your anemia is due to bleeding, finding the cause of the blood loss is very important. This will help avoid further problems.  SEEK IMMEDIATE MEDICAL CARE IF:   You develop fainting, extreme weakness, shortness of breath, or chest pain.   You develop heavy vaginal bleeding.   You develop bloody or black, tarry stools or vomit up blood.   You develop a high fever, rash, repeated vomiting, or dehydration.  Document Released: 06/12/2004 Document Revised: 04/24/2011 Document Reviewed: 03/20/2009 [facility] Patient Information 2012 [place], [place].Gout Gout is an inflammatory  condition (arthritis) caused by a buildup of uric acid crystals in the joints. Uric acid is a chemical that is normally present in the blood. Under some circumstances, uric acid can form into crystals in your  joints. This causes joint redness, soreness, and swelling (inflammation). Repeat attacks are common. Over time, uric acid crystals can form into masses (tophi) near a joint, causing disfigurement. Gout is treatable and often preventable. CAUSES  The disease begins with elevated levels of uric acid in the blood. Uric acid is produced by your body when it breaks down a naturally found substance called purines. This also happens when you eat certain foods such as meats and fish. Causes of an elevated uric acid level include:  Being passed down from parent to child (heredity).   Diseases that cause increased uric acid production (obesity, psoriasis, some cancers).   Excessive alcohol use.   Diet, especially diets rich in meat and seafood.   Medicines, including certain cancer-fighting drugs (chemotherapy), diuretics, and aspirin.   Chronic kidney disease. The kidneys are no longer able to remove uric acid well.   Problems with metabolism.  Conditions strongly associated with gout include:  Obesity.   High blood pressure.   High cholesterol.   Diabetes.  Not everyone with elevated uric acid levels gets gout. It is not understood why some people get gout and others do not. Surgery, joint injury, and eating too much of certain foods are some of the factors that can lead to gout. SYMPTOMS   An attack of gout comes on quickly. It causes intense pain with redness, swelling, and warmth in a joint.   Fever can occur.   Often, only one joint is involved. Certain joints are more commonly involved:   Base of the big toe.   Knee.   Ankle.   Wrist.   Finger.  Without treatment, an attack usually goes away in a few days to weeks. Between attacks, you usually will not have symptoms,  which is different from many other forms of arthritis. DIAGNOSIS  Your caregiver will suspect gout based on your symptoms and exam. Removal of fluid from the joint (arthrocentesis) is done to check for uric acid crystals. Your caregiver will give you a medicine that numbs the area (local anesthetic) and use a needle to remove joint fluid for exam. Gout is confirmed when uric acid crystals are seen in joint fluid, using a special microscope. Sometimes, blood, urine, and X-ray tests are also used. TREATMENT  There are 2 phases to gout treatment: treating the sudden onset (acute) attack and preventing attacks (prophylaxis). Treatment of an Acute Attack  Medicines are used. These include anti-inflammatory medicines or steroid medicines.   An injection of steroid medicine into the affected joint is sometimes necessary.   The painful joint is rested. Movement can worsen the arthritis.   You may use warm or cold treatments on painful joints, depending which works best for you.   Discuss the use of coffee, vitamin C, or cherries with your caregiver. These may be helpful treatment options.  Treatment to Prevent Attacks After the acute attack subsides, your caregiver may advise prophylactic medicine. These medicines either help your kidneys eliminate uric acid from your body or decrease your uric acid production. You may need to stay on these medicines for a very long time. The early phase of treatment with prophylactic medicine can be associated with an increase in acute gout attacks. For this reason, during the first few months of treatment, your caregiver may also advise you to take medicines usually used for acute gout treatment. Be sure you understand your caregiver's directions. You should also discuss dietary treatment with your caregiver. Certain foods such as meats and fish can  increase uric acid levels. Other foods such as dairy can decrease levels. Your caregiver can give you a list of foods to  avoid. HOME CARE INSTRUCTIONS   Do not take aspirin to relieve pain. This raises uric acid levels.   Only take over-the-counter or prescription medicines for pain, discomfort, or fever as directed by your caregiver.   Rest the joint as much as possible. When in bed, keep sheets and blankets off painful areas.   Keep the affected joint raised (elevated).   Use crutches if the painful joint is in your leg.   Drink enough water and fluids to keep your urine clear or pale yellow. This helps your body get rid of uric acid. Do not drink alcoholic beverages. They slow the passage of uric acid.   Follow your caregiver's dietary instructions. Pay careful attention to the amount of protein you eat. Your daily diet should emphasize fruits, vegetables, whole grains, and fat-free or low-fat milk products.   Maintain a healthy body weight.  SEEK MEDICAL CARE IF:   You have an oral temperature above 102 F (38.9 C).   You develop diarrhea, vomiting, or any side effects from medicines.   You do not feel better in 24 hours, or you are getting worse.  SEEK IMMEDIATE MEDICAL CARE IF:   Your joint becomes suddenly more tender and you have:   Chills.   An oral temperature above 102 F (38.9 C), not controlled by medicine.  MAKE SURE YOU:   Understand these instructions.   Will watch your condition.   Will get help right away if you are not doing well or get worse.  Document Released: 05/02/2000 Document Revised: 04/24/2011 Document Reviewed: 08/13/2009 [facility] Patient Information 2012 [place], [place].

## 2011-07-28 NOTE — Progress Notes (Signed)
Subjective:    Patient ID: Mike Orozco, male    DOB: 10/10/1961, 51 y.o.   MRN: [id number]  Anemia Presents for follow-up visit. There has been no abdominal pain, anorexia, bruising/bleeding easily, confusion, fever, leg swelling, light-headedness, malaise/fatigue, pallor, palpitations, paresthesias, pica or weight loss. Signs of blood loss that are not present include hematemesis, hematochezia and melena. There are no compliance problems.   Hypertension This is a chronic problem. The current episode started more than 1 year ago. The problem has been gradually improving since onset. The problem is controlled. Pertinent negatives include no anxiety, blurred vision, chest pain, headaches, malaise/fatigue, neck pain, orthopnea, palpitations, peripheral edema, PND, shortness of breath or sweats. There are no associated agents to hypertension. Past treatments include diuretics, beta blockers and ACE inhibitors. The current treatment provides significant improvement. Compliance problems include exercise and diet.       Review of Systems  Constitutional: Negative for fever, chills, weight loss, malaise/fatigue, diaphoresis, activity change, appetite change, fatigue and unexpected weight change.  HENT: Negative.  Negative for neck pain.   Eyes: Negative.  Negative for blurred vision.  Respiratory: Negative for apnea, cough, choking, chest tightness, shortness of breath, wheezing and stridor.   Cardiovascular: Negative for chest pain, palpitations, orthopnea, leg swelling and PND.  Gastrointestinal: Negative for nausea, vomiting, abdominal pain, diarrhea, constipation, blood in stool, melena, hematochezia, abdominal distention, anal bleeding, rectal pain, anorexia and hematemesis.  Genitourinary: Negative.   Musculoskeletal: Negative for myalgias, back pain, joint swelling, arthralgias and gait problem.  Skin: Negative for color change, pallor, rash and wound.  Neurological: Negative for dizziness,  tremors, seizures, syncope, facial asymmetry, speech difficulty, weakness, light-headedness, numbness, headaches and paresthesias.  Hematological: Negative for adenopathy. Does not bruise/bleed easily.  Psychiatric/Behavioral: Negative.  Negative for confusion.       Objective:   Physical Exam  Vitals reviewed. Constitutional: He is oriented to person, place, and time. He appears well-developed and well-nourished. No distress.  HENT:  Head: Normocephalic and atraumatic.  Mouth/Throat: Oropharynx is clear and moist. No oropharyngeal exudate.  Eyes: Conjunctivae are normal. Right eye exhibits no discharge. Left eye exhibits no discharge. No scleral icterus.  Neck: Normal range of motion. Neck supple. No JVD present. No tracheal deviation present. No thyromegaly present.  Cardiovascular: Normal rate, regular rhythm, normal heart sounds and intact distal pulses.  Exam reveals no gallop and no friction rub.   No murmur heard. Pulmonary/Chest: Effort normal and breath sounds normal. No stridor. No respiratory distress. He has no wheezes. He has no rales. He exhibits no tenderness.  Abdominal: Soft. Bowel sounds are normal. He exhibits no distension and no mass. There is no tenderness. There is no rebound and no guarding. Hernia confirmed negative in the right inguinal area and confirmed negative in the left inguinal area.  Genitourinary: Rectum normal, prostate normal, testes normal and penis normal. Rectal exam shows no external hemorrhoid, no internal hemorrhoid, no fissure, no mass, no tenderness and anal tone normal. Guaiac negative stool. Prostate is not enlarged and not tender. Right testis shows no mass, no swelling and no tenderness. Right testis is descended. Left testis shows no mass, no swelling and no tenderness. Left testis is descended. Uncircumcised. No phimosis, paraphimosis, hypospadias, penile erythema or penile tenderness. No discharge found.  Musculoskeletal: Normal range of motion.  He exhibits no edema and no tenderness.  Lymphadenopathy:    He has no cervical adenopathy.       Right: No inguinal adenopathy present.  Left: No inguinal adenopathy present.  Neurological: He is oriented to person, place, and time.  Skin: Skin is warm and dry. No rash noted. He is not diaphoretic. No erythema. No pallor.  Psychiatric: He has a normal mood and affect. His behavior is normal. Judgment and thought content normal.      Lab Results  Component Value Date   WBC 5.9 05/26/2011   HGB 9.9* 05/26/2011   HCT 29.5* 05/26/2011   PLT 227.0 05/26/2011   GLUCOSE 104* 05/26/2011   CHOL 125 01/15/2011   TRIG 247.0* 01/15/2011   HDL 36.90* 01/15/2011   LDLDIRECT 64.5 01/15/2011   LDLCALC  Value: 98        Total Cholesterol/HDL:CHD Risk Coronary Heart Disease Risk Table                     Men   Women  1/2 Average Risk   3.4   3.3  Average Risk       5.0   4.4  2 X Average Risk   9.6   7.1  3 X Average Risk  23.4   11.0        Use the calculated Patient Ratio above and the CHD Risk Table to determine the patient's CHD Risk.        ATP III CLASSIFICATION (LDL):  <100     mg/dL   Optimal  629-528  mg/dL   Near or Above                    Optimal  130-159  mg/dL   Borderline  413-244  mg/dL   High  >010     mg/dL   Very High 27/06/5364   ALT 28 05/26/2011   AST 22 05/26/2011   NA 143 05/26/2011   K 3.7 05/26/2011   CL 107 05/26/2011   CREATININE 1.0 05/26/2011   BUN 12 05/26/2011   CO2 28 05/26/2011   TSH 1.68 01/15/2011   PSA 0.91 06/17/2010   INR 1.11 02/05/2011      Assessment & Plan:

## 2011-07-28 NOTE — Assessment & Plan Note (Signed)
I have asked him to restart uloric

## 2011-07-28 NOTE — Assessment & Plan Note (Signed)
I will check his a1c today to screen for DM II

## 2011-07-29 ENCOUNTER — Encounter: Payer: Self-pay | Admitting: Internal Medicine

## 2011-08-05 ENCOUNTER — Telehealth: Payer: Self-pay

## 2011-08-06 NOTE — Telephone Encounter (Signed)
Spoke with patient regarding lab letter and mailed pt education out for triglycerides

## 2011-08-25 ENCOUNTER — Other Ambulatory Visit: Payer: Self-pay | Admitting: Internal Medicine

## 2011-08-25 ENCOUNTER — Ambulatory Visit: Payer: Managed Care, Other (non HMO) | Admitting: Internal Medicine

## 2011-09-03 ENCOUNTER — Other Ambulatory Visit: Payer: Self-pay | Admitting: Internal Medicine

## 2011-09-04 ENCOUNTER — Telehealth: Payer: Self-pay | Admitting: Internal Medicine

## 2011-09-04 NOTE — Telephone Encounter (Signed)
The patient called and requested samples of Lipofen 150mg . He stated at the pharmacy, his meds rang up for more than he could pay.  I pulled 5 boxes of 5 tabs each and told the patient his samples were available.  His lov was 07/28/2011.  If this is an error, please let me know and I will remove the samples from the basket! Thanks!

## 2011-09-08 ENCOUNTER — Encounter: Payer: Self-pay | Admitting: Internal Medicine

## 2011-09-08 ENCOUNTER — Encounter: Payer: Self-pay | Admitting: Cardiology

## 2011-09-08 ENCOUNTER — Ambulatory Visit (INDEPENDENT_AMBULATORY_CARE_PROVIDER_SITE_OTHER): Payer: Managed Care, Other (non HMO) | Admitting: Cardiology

## 2011-09-08 ENCOUNTER — Ambulatory Visit (INDEPENDENT_AMBULATORY_CARE_PROVIDER_SITE_OTHER): Payer: Managed Care, Other (non HMO) | Admitting: Internal Medicine

## 2011-09-08 ENCOUNTER — Ambulatory Visit: Payer: Managed Care, Other (non HMO) | Admitting: Internal Medicine

## 2011-09-08 ENCOUNTER — Other Ambulatory Visit (INDEPENDENT_AMBULATORY_CARE_PROVIDER_SITE_OTHER): Payer: Managed Care, Other (non HMO)

## 2011-09-08 ENCOUNTER — Ambulatory Visit: Payer: Managed Care, Other (non HMO) | Admitting: *Deleted

## 2011-09-08 VITALS — BP 138/102 | HR 49 | Ht 66.0 in | Wt 198.0 lb

## 2011-09-08 VITALS — BP 118/70 | HR 50 | Temp 98.3°F | Resp 16 | Wt 201.0 lb

## 2011-09-08 DIAGNOSIS — D649 Anemia, unspecified: Secondary | ICD-10-CM

## 2011-09-08 DIAGNOSIS — I5022 Chronic systolic (congestive) heart failure: Secondary | ICD-10-CM

## 2011-09-08 DIAGNOSIS — I10 Essential (primary) hypertension: Secondary | ICD-10-CM

## 2011-09-08 DIAGNOSIS — E781 Pure hyperglyceridemia: Secondary | ICD-10-CM

## 2011-09-08 DIAGNOSIS — I428 Other cardiomyopathies: Secondary | ICD-10-CM

## 2011-09-08 LAB — CBC WITH DIFFERENTIAL/PLATELET
Eosinophils Relative: 3.1 % (ref 0.0–5.0)
HCT: 31.7 % — ABNORMAL LOW (ref 39.0–52.0)
Hemoglobin: 10.4 g/dL — ABNORMAL LOW (ref 13.0–17.0)
Lymphocytes Relative: 35.9 % (ref 12.0–46.0)
Lymphs Abs: 1.8 10*3/uL (ref 0.7–4.0)
Monocytes Relative: 8.1 % (ref 3.0–12.0)
Neutro Abs: 2.6 10*3/uL (ref 1.4–7.7)
RBC: 3.44 Mil/uL — ABNORMAL LOW (ref 4.22–5.81)
WBC: 4.9 10*3/uL (ref 4.5–10.5)

## 2011-09-08 LAB — COMPREHENSIVE METABOLIC PANEL
AST: 35 U/L (ref 0–37)
Albumin: 4.7 g/dL (ref 3.5–5.2)
Alkaline Phosphatase: 44 U/L (ref 39–117)
BUN: 31 mg/dL — ABNORMAL HIGH (ref 6–23)
Calcium: 10.1 mg/dL (ref 8.4–10.5)
Chloride: 107 mEq/L (ref 96–112)
Creatinine, Ser: 1.7 mg/dL — ABNORMAL HIGH (ref 0.4–1.5)
Glucose, Bld: 103 mg/dL — ABNORMAL HIGH (ref 70–99)

## 2011-09-08 LAB — IBC PANEL
Saturation Ratios: 31.8 % (ref 20.0–50.0)
Transferrin: 314.1 mg/dL (ref 212.0–360.0)

## 2011-09-08 LAB — LIPID PANEL
Cholesterol: 180 mg/dL (ref 0–200)
HDL: 44.6 mg/dL (ref >39.00)
VLDL: 45.4 mg/dL — ABNORMAL HIGH (ref 0.0–40.0)

## 2011-09-08 LAB — LDL CHOLESTEROL, DIRECT: Direct LDL: 90.2 mg/dL

## 2011-09-08 NOTE — Assessment & Plan Note (Signed)
He feels much better on iron replacement therapy, I will recheck his CBC/iron/ferritin

## 2011-09-08 NOTE — Assessment & Plan Note (Signed)
Plan continue beta blocker and ACE inhibitor. Repeat echocardiogram to reassess LV function. Previous reduction felt most likely related to either EtOH or hypertension.

## 2011-09-08 NOTE — Assessment & Plan Note (Signed)
Euvolemic on examination. Continue present dose of spironolactone.

## 2011-09-08 NOTE — Patient Instructions (Signed)
Hypertriglyceridemia  Diet for High blood levels of Triglycerides Most fats in food are triglycerides. Triglycerides in your blood are stored as fat in your body. High levels of triglycerides in your blood may put you at a greater risk for heart disease and stroke.  Normal triglyceride levels are less than 150 mg/dL. Borderline high levels are 150-199 mg/dl. High levels are 200 - 499 mg/dL, and very high triglyceride levels are greater than 500 mg/dL. The decision to treat high triglycerides is generally based on the level. For people with borderline or high triglyceride levels, treatment includes weight loss and exercise. Drugs are recommended for people with very high triglyceride levels. Many people who need treatment for high triglyceride levels have metabolic syndrome. This syndrome is a collection of disorders that often include: insulin resistance, high blood pressure, blood clotting problems, high cholesterol and triglycerides. TESTING PROCEDURE FOR TRIGLYCERIDES  You should not eat 4 hours before getting your triglycerides measured. The normal range of triglycerides is between 10 and 250 milligrams per deciliter (mg/dl). Some people may have extreme levels (1000 or above), but your triglyceride level may be too high if it is above 150 mg/dl, depending on what other risk factors you have for heart disease.   People with high blood triglycerides may also have high blood cholesterol levels. If you have high blood cholesterol as well as high blood triglycerides, your risk for heart disease is probably greater than if you only had high triglycerides. High blood cholesterol is one of the main risk factors for heart disease.  CHANGING YOUR DIET  Your weight can affect your blood triglyceride level. If you are more than 20% above your ideal body weight, you may be able to lower your blood triglycerides by losing weight. Eating less and exercising regularly is the best way to combat this. Fat provides  more calories than any other food. The best way to lose weight is to eat less fat. Only 30% of your total calories should come from fat. Less than 7% of your diet should come from saturated fat. A diet low in fat and saturated fat is the same as a diet to decrease blood cholesterol. By eating a diet lower in fat, you may lose weight, lower your blood cholesterol, and lower your blood triglyceride level.  Eating a diet low in fat, especially saturated fat, may also help you lower your blood triglyceride level. Ask your dietitian to help you figure how much fat you can eat based on the number of calories your caregiver has prescribed for you.  Exercise, in addition to helping with weight loss may also help lower triglyceride levels.   Alcohol can increase blood triglycerides. You may need to stop drinking alcoholic beverages.   Too much carbohydrate in your diet may also increase your blood triglycerides. Some complex carbohydrates are necessary in your diet. These may include bread, rice, potatoes, other starchy vegetables and cereals.   Reduce "simple" carbohydrates. These may include pure sugars, candy, honey, and jelly without losing other nutrients. If you have the kind of high blood triglycerides that is affected by the amount of carbohydrates in your diet, you will need to eat less sugar and less high-sugar foods. Your caregiver can help you with this.   Adding 2-4 grams of fish oil (EPA+ DHA) may also help lower triglycerides. Speak with your caregiver before adding any supplements to your regimen.  Following the Diet  Maintain your ideal weight. Your caregivers can help you with a diet. Generally,   eating less food and getting more exercise will help you lose weight. Joining a weight control group may also help. Ask your caregivers for a good weight control group in your area.  Eat low-fat foods instead of high-fat foods. This can help you lose weight too.  These foods are lower in fat. Eat MORE  of these:   Dried beans, peas, and lentils.   Egg whites.   Low-fat cottage cheese.   Fish.   Lean cuts of meat, such as round, sirloin, rump, and flank (cut extra fat off meat you fix).   Whole grain breads, cereals and pasta.   Skim and nonfat dry milk.   Low-fat yogurt.   Poultry without the skin.   Cheese made with skim or part-skim milk, such as mozzarella, parmesan, farmers', ricotta, or pot cheese.  These are higher fat foods. Eat LESS of these:   Whole milk and foods made from whole milk, such as American, blue, cheddar, monterey jack, and swiss cheese   High-fat meats, such as luncheon meats, sausages, knockwurst, bratwurst, hot dogs, ribs, corned beef, ground pork, and regular ground beef.   Fried foods.  Limit saturated fats in your diet. Substituting unsaturated fat for saturated fat may decrease your blood triglyceride level. You will need to read package labels to know which products contain saturated fats.  These foods are high in saturated fat. Eat LESS of these:   Fried pork skins.   Whole milk.   Skin and fat from poultry.   Palm oil.   Butter.   Shortening.   Cream cheese.   Bacon.   Margarines and baked goods made from listed oils.   Vegetable shortenings.   Chitterlings.   Fat from meats.   Coconut oil.   Palm kernel oil.   Lard.   Cream.   Sour cream.   Fatback.   Coffee whiteners and non-dairy creamers made with these oils.   Cheese made from whole milk.  Use unsaturated fats (both polyunsaturated and monounsaturated) moderately. Remember, even though unsaturated fats are better than saturated fats; you still want a diet low in total fat.  These foods are high in unsaturated fat:   Canola oil.   Sunflower oil.   Mayonnaise.   Almonds.   Peanuts.   Pine nuts.   Margarines made with these oils.   Safflower oil.   Olive oil.   Avocados.   Cashews.   Peanut butter.   Sunflower seeds.   Soybean oil.     Peanut oil.   Olives.   Pecans.   Walnuts.   Pumpkin seeds.  Avoid sugar and other high-sugar foods. This will decrease carbohydrates without decreasing other nutrients. Sugar in your food goes rapidly to your blood. When there is excess sugar in your blood, your liver may use it to make more triglycerides. Sugar also contains calories without other important nutrients.  Eat LESS of these:   Sugar, brown sugar, powdered sugar, jam, jelly, preserves, honey, syrup, molasses, pies, candy, cakes, cookies, frosting, pastries, colas, soft drinks, punches, fruit drinks, and regular gelatin.   Avoid alcohol. Alcohol, even more than sugar, may increase blood triglycerides. In addition, alcohol is high in calories and low in nutrients. Ask for sparkling water, or a diet soft drink instead of an alcoholic beverage.  Suggestions for planning and preparing meals   Bake, broil, grill or roast meats instead of frying.   Remove fat from meats and skin from poultry before cooking.   Add spices,   herbs, lemon juice or vinegar to vegetables instead of salt, rich sauces or gravies.   Use a non-stick skillet without fat or use no-stick sprays.   Cool and refrigerate stews and broth. Then remove the hardened fat floating on the surface before serving.   Refrigerate meat drippings and skim off fat to make low-fat gravies.   Serve more fish.   Use less butter, margarine and other high-fat spreads on bread or vegetables.   Use skim or reconstituted non-fat dry milk for cooking.   Cook with low-fat cheeses.   Substitute low-fat yogurt or cottage cheese for all or part of the sour cream in recipes for sauces, dips or congealed salads.   Use half yogurt/half mayonnaise in salad recipes.   Substitute evaporated skim milk for cream. Evaporated skim milk or reconstituted non-fat dry milk can be whipped and substituted for whipped cream in certain recipes.   Choose fresh fruits for dessert instead of  high-fat foods such as pies or cakes. Fruits are naturally low in fat.  When Dining Out   Order low-fat appetizers such as fruit or vegetable juice, pasta with vegetables or tomato sauce.   Select clear, rather than cream soups.   Ask that dressings and gravies be served on the side. Then use less of them.   Order foods that are baked, broiled, poached, steamed, stir-fried, or roasted.   Ask for margarine instead of butter, and use only a small amount.   Drink sparkling water, unsweetened tea or coffee, or diet soft drinks instead of alcohol or other sweet beverages.  QUESTIONS AND ANSWERS ABOUT OTHER FATS IN THE BLOOD: SATURATED FAT, TRANS FAT, AND CHOLESTEROL What is trans fat? Trans fat is a type of fat that is formed when vegetable oil is hardened through a process called hydrogenation. This process helps makes foods more solid, gives them shape, and prolongs their shelf life. Trans fats are also called hydrogenated or partially hydrogenated oils.  What do saturated fat, trans fat, and cholesterol in foods have to do with heart disease? Saturated fat, trans fat, and cholesterol in the diet all raise the level of LDL "bad" cholesterol in the blood. The higher the LDL cholesterol, the greater the risk for coronary heart disease (CHD). Saturated fat and trans fat raise LDL similarly.  What foods contain saturated fat, trans fat, and cholesterol? High amounts of saturated fat are found in animal products, such as fatty cuts of meat, chicken skin, and full-fat dairy products like butter, whole milk, cream, and cheese, and in tropical vegetable oils such as palm, palm kernel, and coconut oil. Trans fat is found in some of the same foods as saturated fat, such as vegetable shortening, some margarines (especially hard or stick margarine), crackers, cookies, baked goods, fried foods, salad dressings, and other processed foods made with partially hydrogenated vegetable oils. Small amounts of trans fat  also occur naturally in some animal products, such as milk products, beef, and lamb. Foods high in cholesterol include liver, other organ meats, egg yolks, shrimp, and full-fat dairy products. How can I use the new food label to make heart-healthy food choices? Check the Nutrition Facts panel of the food label. Choose foods lower in saturated fat, trans fat, and cholesterol. For saturated fat and cholesterol, you can also use the Percent Daily Value (%DV): 5% DV or less is low, and 20% DV or more is high. (There is no %DV for trans fat.) Use the Nutrition Facts panel to choose foods low in   saturated fat and cholesterol, and if the trans fat is not listed, read the ingredients and limit products that list shortening or hydrogenated or partially hydrogenated vegetable oil, which tend to be high in trans fat. POINTS TO REMEMBER: YOU NEED A LITTLE TLC (THERAPEUTIC LIFESTYLE CHANGES)  Discuss your risk for heart disease with your caregivers, and take steps to reduce risk factors.   Change your diet. Choose foods that are low in saturated fat, trans fat, and cholesterol.   Add exercise to your daily routine if it is not already being done. Participate in physical activity of moderate intensity, like brisk walking, for at least 30 minutes on most, and preferably all days of the week. No time? Break the 30 minutes into three, 10-minute segments during the day.   Stop smoking. If you do smoke, contact your caregiver to discuss ways in which they can help you quit.   Do not use street drugs.   Maintain a normal weight.   Maintain a healthy blood pressure.   Keep up with your blood work for checking the fats in your blood as directed by your caregiver.  Document Released: 02/21/2004 Document Revised: 04/24/2011 Document Reviewed: 09/18/2008 [facility] Patient Information 2012 [place], [place].Iron Deficiency Anemia There are many types of anemia. Iron deficiency anemia is the most common. Iron deficiency  anemia is a decrease in the number of red blood cells caused by too little iron. Without enough iron, your body does not produce enough hemoglobin. Hemoglobin is a substance in red blood cells that carries oxygen to the body's tissues. Iron deficiency anemia may leave you tired and short of breath. CAUSES   Lack of iron in the diet.   This may be seen in infants and children, because there is little iron in milk.   This may be seen in adults who do not eat enough iron-rich foods.   This may be seen in pregnant or breastfeeding women who do not take iron supplements. There is a much higher need for iron intake at these times.   Poor absorption of iron, as seen with intestinal disorders.   Intestinal bleeding.   Heavy periods.  SYMPTOMS  Mild anemia may not be noticeable. Symptoms may include:  Fatigue.   Headache.   Pale skin.   Weakness.   Shortness of breath.   Dizziness.   Cold hands and feet.   Fast or irregular heartbeat.  DIAGNOSIS  Diagnosis requires a thorough evaluation and physical exam by your caregiver.  Blood tests are generally used to confirm iron deficiency anemia.   Additional tests may be done to find the underlying cause of your anemia. These may include:   Testing for blood in the stool (fecal occult blood test).   A procedure to see inside the colon and rectum (colonoscopy).   A procedure to see inside the esophagus and stomach (endoscopy).  TREATMENT   Correcting the cause of the iron deficiency is the first step.   Medicines, such as oral contraceptives, can make heavy menstrual flows lighter.   Antibiotics and other medicines can be used to treat peptic ulcers.   Surgery may be needed to remove a bleeding polyp, tumor, or fibroid.   Often, iron supplements (ferrous sulfate) are taken.   For the best iron absorption, take these supplements with an empty stomach.   You may need to take the supplements with food if you cannot tolerate  them on an empty stomach. Vitamin C improves the absorption of iron. Your caregiver  may recommend taking your iron tablets with a glass of orange juice or vitamin C supplement.   Milk and antacids should not be taken at the same time as iron supplements. They may interfere with the absorption of iron.   Iron supplements can cause constipation. A stool softener is often recommended.   Pregnant and breastfeeding women will need to take extra iron, because their normal diet usually will not provide the required amount.   Patients who cannot tolerate iron by mouth can take it through a vein (intravenously) or by an injection into the muscle.  HOME CARE INSTRUCTIONS   Ask your dietitian for help with diet questions.   Take iron and vitamins as directed by your caregiver.   Eat a diet rich in iron. Eat liver, lean beef, whole-grain bread, eggs, dried fruit, and dark green leafy vegetables.  SEEK IMMEDIATE MEDICAL CARE IF:   You have a fainting episode. Do not drive yourself. Call your local emergency services (911 in [place] if no other help is available.   You have chest pain, nausea, or vomiting.   You develop severe or increased shortness of breath with activities.   You develop weakness or increased thirst.   You have a rapid heartbeat.   You develop unexplained sweating or become lightheaded when getting up from a chair or bed.  MAKE SURE YOU:   Understand these instructions.   Will watch your condition.   Will get help right away if you are not doing well or get worse.  Document Released: 05/02/2000 Document Revised: 04/24/2011 Document Reviewed: 09/11/2009 [facility] Patient Information 2012 [place], [place].

## 2011-09-08 NOTE — Assessment & Plan Note (Signed)
Blood pressure elevated today. However he follows this at home and it is typically controlled. He will continue to follow and we will add other medications if needed.

## 2011-09-08 NOTE — Progress Notes (Signed)
   HPI: Pleasant male who has a nonischemic cardiomyopathy with EF initially at 15-20%. Cardiac catheterization in 03/2010 revealed normal coronary arteries. Last echo in 4/12 demonstrated good recovery of LV function at 45-50% with mild LVH, grade 1 diast dysfxn, mild AI and mild LAE. He was seen by Dr. Graciela Husbands in 4/12 and there is no plan to proceed with ICD given recovery of his LV fxn. He was last seen in Oct of 2012. Since then, the patient denies any dyspnea on exertion, orthopnea, PND, pedal edema, palpitations, syncope or chest pain.   Current Outpatient Prescriptions  Medication Sig Dispense Refill  . aspirin 81 MG EC tablet Take 81 mg by mouth daily.        . carvedilol (COREG) 25 MG tablet TAKE ONE TABLET BY MOUTH TWICE A DAY  60 tablet  12  . Fenofibrate (LIPOFEN) 150 MG CAPS Take 1 capsule (150 mg total) by mouth daily.  30 each  11  . lisinopril (PRINIVIL,ZESTRIL) 40 MG tablet Take 1 tablet (40 mg total) by mouth daily.  90 tablet  3  . pravastatin (PRAVACHOL) 40 MG tablet TAKE 1 TABLET BY MOUTH EVERY EVENING  30 tablet  11  . spironolactone (ALDACTONE) 25 MG tablet TAKE 1 TABLET BY MOUTH EVERY DAY  30 tablet  6     Past Medical History  Diagnosis Date  . Hypertension   . Heart failure, chronic systolic   . Non-ischemic cardiomyopathy     a) Echo 03/25/2010: EF 20-25%; mild AI; Mod MR; Mild LAE  b)cardiac cath 03/26/2010: normal cors; severe pulmonary HTN;PCWP 41; EF 10-15%;  c. echo 4/12:  EF 45-50%, mild LVH, grade 1 diast dysfxn, mild AI, mild LAE  . History of ETOH abuse   . Gout, unspecified   . Pure hyperglyceridemia   . Tobacco use disorder     Past Surgical History  Procedure Date  . L5-s1 fusion     status post  . Cystectomy 1994    Removal from left lung  at [facility]    History   Social History  . Marital Status: Married    Spouse Name: N/A    Number of Children: N/A  . Years of Education: N/A   Occupational History  . [employer]     [facility]    Social History Main Topics  . Smoking status: Former Smoker    Types: Cigarettes    Quit date: 02/16/2010  . Smokeless tobacco: Not on file  . Alcohol Use: No     Quit 02/2010  . Drug Use: No  . Sexually Active: Yes    Birth Control/ Protection: Condom   Other Topics Concern  . Not on file   Social History Narrative   No regular exercisemarried    ROS: no fevers or chills, productive cough, hemoptysis, dysphasia, odynophagia, melena, hematochezia, dysuria, hematuria, rash, seizure activity, orthopnea, PND, pedal edema, claudication. Remaining systems are negative.  Physical Exam: Well-developed well-nourished in no acute distress.  Skin is warm and dry.  HEENT is normal.  Neck is supple. No thyromegaly.  Chest is clear to auscultation with normal expansion.  Cardiovascular exam is regular rate and rhythm.  Abdominal exam nontender or distended. No masses palpated. Extremities show no edema. neuro grossly intact  ECG marked sinus bradycardia at a rate of 49. First degree AV block. No ST changes.

## 2011-09-08 NOTE — Patient Instructions (Signed)
Your physician has requested that you have an echocardiogram. Echocardiography is a painless test that uses sound waves to create images of your heart. It provides your doctor with information about the size and shape of your heart and how well your heart's chambers and valves are working. This procedure takes approximately one hour. There are no restrictions for this procedure.  The current medical regimen is effective;  continue present plan and medications.  Follow up in 1 year with Dr Jens Som.  You will receive a letter in the mail 2 months before you are due.  Please call us when you receive this letter to schedule your follow up appointment.

## 2011-09-08 NOTE — Assessment & Plan Note (Signed)
His BP is well controlled, I will recheck his lytes today

## 2011-09-08 NOTE — Progress Notes (Signed)
Subjective:    Patient ID: Mike Orozco, male    DOB: 10/10/1961, 51 y.o.   MRN: [id number]  Anemia Presents for follow-up visit. There has been no abdominal pain, anorexia, bruising/bleeding easily, confusion, fever, leg swelling, light-headedness, malaise/fatigue, pallor, palpitations, paresthesias, pica or weight loss. Signs of blood loss that are not present include hematemesis, hematochezia and melena. There are no compliance problems.       Review of Systems  Constitutional: Negative for fever, chills, weight loss, malaise/fatigue, diaphoresis, activity change, appetite change, fatigue and unexpected weight change.  HENT: Negative.   Eyes: Negative.   Respiratory: Negative for cough, chest tightness, shortness of breath, wheezing and stridor.   Cardiovascular: Negative for chest pain, palpitations and leg swelling.  Gastrointestinal: Negative for nausea, vomiting, abdominal pain, diarrhea, constipation, melena, hematochezia, abdominal distention, anal bleeding, anorexia and hematemesis.  Genitourinary: Negative.   Musculoskeletal: Negative for myalgias, back pain, joint swelling, arthralgias and gait problem.  Skin: Negative for color change, pallor, rash and wound.  Neurological: Negative for dizziness, tremors, seizures, syncope, facial asymmetry, speech difficulty, weakness, light-headedness, numbness, headaches and paresthesias.  Hematological: Negative for adenopathy. Does not bruise/bleed easily.  Psychiatric/Behavioral: Negative.  Negative for confusion.       Objective:   Physical Exam  Vitals reviewed. Constitutional: He is oriented to person, place, and time. He appears well-developed and well-nourished. No distress.  HENT:  Head: Normocephalic and atraumatic.  Mouth/Throat: Oropharynx is clear and moist. No oropharyngeal exudate.  Eyes: Conjunctivae are normal. Right eye exhibits no discharge. Left eye exhibits no discharge. No scleral icterus.  Neck: Normal range  of motion. Neck supple. No JVD present. No tracheal deviation present. No thyromegaly present.  Cardiovascular: Normal rate, regular rhythm, normal heart sounds and intact distal pulses.  Exam reveals no gallop and no friction rub.   No murmur heard. Pulmonary/Chest: Effort normal and breath sounds normal. No stridor. No respiratory distress. He has no wheezes. He has no rales. He exhibits no tenderness.  Abdominal: Soft. Bowel sounds are normal. He exhibits no distension and no mass. There is no tenderness. There is no rebound and no guarding.  Musculoskeletal: Normal range of motion. He exhibits no edema and no tenderness.  Lymphadenopathy:    He has no cervical adenopathy.  Neurological: He is oriented to person, place, and time.  Skin: Skin is warm and dry. No rash noted. He is not diaphoretic. No erythema. No pallor.  Psychiatric: He has a normal mood and affect. His behavior is normal. Judgment and thought content normal.     Lab Results  Component Value Date   WBC 4.9 07/28/2011   HGB 10.6* 07/28/2011   HCT 32.6* 07/28/2011   PLT 255.0 07/28/2011   GLUCOSE 105* 07/28/2011   CHOL 125 01/15/2011   TRIG 247.0* 01/15/2011   HDL 36.90* 01/15/2011   LDLDIRECT 64.5 01/15/2011   LDLCALC  Value: 98        Total Cholesterol/HDL:CHD Risk Coronary Heart Disease Risk Table                     Men   Women  1/2 Average Risk   3.4   3.3  Average Risk       5.0   4.4  2 X Average Risk   9.6   7.1  3 X Average Risk  23.4   11.0        Use the calculated Patient Ratio above and the CHD Risk Table to determine the  patient's CHD Risk.        ATP III CLASSIFICATION (LDL):  <100     mg/dL   Optimal  161-096  mg/dL   Near or Above                    Optimal  130-159  mg/dL   Borderline  045-409  mg/dL   High  >811     mg/dL   Very High 91/08/7827   ALT 28 05/26/2011   AST 22 05/26/2011   NA 137 07/28/2011   K 4.3 07/28/2011   CL 105 07/28/2011   CREATININE 1.6* 07/28/2011   BUN 27* 07/28/2011   CO2 28 07/28/2011   TSH  1.68 01/15/2011   PSA 0.91 06/17/2010   INR 1.11 02/05/2011   HGBA1C 5.5 07/28/2011       Assessment & Plan:

## 2011-09-08 NOTE — Assessment & Plan Note (Signed)
I will recheck his FLP today 

## 2011-09-09 ENCOUNTER — Encounter: Payer: Self-pay | Admitting: Internal Medicine

## 2011-09-15 ENCOUNTER — Encounter: Payer: Self-pay | Admitting: Internal Medicine

## 2011-09-22 ENCOUNTER — Other Ambulatory Visit: Payer: Self-pay

## 2011-09-22 ENCOUNTER — Ambulatory Visit (HOSPITAL_COMMUNITY): Payer: Managed Care, Other (non HMO) | Attending: Internal Medicine

## 2011-09-22 DIAGNOSIS — I509 Heart failure, unspecified: Secondary | ICD-10-CM | POA: Insufficient documentation

## 2011-09-22 DIAGNOSIS — I10 Essential (primary) hypertension: Secondary | ICD-10-CM | POA: Insufficient documentation

## 2011-09-22 DIAGNOSIS — I428 Other cardiomyopathies: Secondary | ICD-10-CM | POA: Insufficient documentation

## 2011-09-22 DIAGNOSIS — I079 Rheumatic tricuspid valve disease, unspecified: Secondary | ICD-10-CM | POA: Insufficient documentation

## 2011-10-03 ENCOUNTER — Telehealth: Payer: Self-pay | Admitting: Internal Medicine

## 2011-10-03 NOTE — Telephone Encounter (Signed)
Pt requesting gout and cholesterol meds samples

## 2011-10-03 NOTE — Telephone Encounter (Signed)
Pt requesting gout med and cholesterol med samples---pt ph#  [phone]

## 2011-10-06 ENCOUNTER — Ambulatory Visit: Payer: Managed Care, Other (non HMO) | Admitting: *Deleted

## 2011-10-06 ENCOUNTER — Other Ambulatory Visit: Payer: Self-pay | Admitting: Internal Medicine

## 2011-10-06 MED ORDER — PRAVASTATIN SODIUM 40 MG PO TABS: ORAL_TABLET | ORAL | Status: DC

## 2011-10-06 MED ORDER — FEBUXOSTAT 40 MG PO TABS: 40.0000 mg | ORAL_TABLET | Freq: Every day | ORAL | Status: DC

## 2011-10-08 ENCOUNTER — Telehealth: Payer: Self-pay

## 2011-10-08 NOTE — Telephone Encounter (Signed)
Patient called [facility] requesting samples of lipofen and uloric. Samples available for lipofen and pt asst application provided for uloric.

## 2011-10-09 NOTE — Telephone Encounter (Signed)
Patient notified

## 2011-10-20 ENCOUNTER — Ambulatory Visit: Payer: Managed Care, Other (non HMO) | Admitting: *Deleted

## 2011-10-22 ENCOUNTER — Encounter: Payer: Self-pay | Admitting: *Deleted

## 2011-10-27 ENCOUNTER — Encounter: Payer: Self-pay | Admitting: Internal Medicine

## 2011-10-27 ENCOUNTER — Ambulatory Visit (INDEPENDENT_AMBULATORY_CARE_PROVIDER_SITE_OTHER): Payer: Managed Care, Other (non HMO) | Admitting: Internal Medicine

## 2011-10-27 ENCOUNTER — Other Ambulatory Visit (INDEPENDENT_AMBULATORY_CARE_PROVIDER_SITE_OTHER): Payer: Managed Care, Other (non HMO)

## 2011-10-27 VITALS — BP 100/60 | HR 60 | Ht 66.0 in | Wt 204.8 lb

## 2011-10-27 DIAGNOSIS — D638 Anemia in other chronic diseases classified elsewhere: Secondary | ICD-10-CM

## 2011-10-27 DIAGNOSIS — N189 Chronic kidney disease, unspecified: Secondary | ICD-10-CM

## 2011-10-27 DIAGNOSIS — D649 Anemia, unspecified: Secondary | ICD-10-CM

## 2011-10-27 DIAGNOSIS — Z1211 Encounter for screening for malignant neoplasm of colon: Secondary | ICD-10-CM

## 2011-10-27 LAB — BASIC METABOLIC PANEL
Calcium: 9.5 mg/dL (ref 8.4–10.5)
Creatinine, Ser: 1.7 mg/dL — ABNORMAL HIGH (ref 0.4–1.5)
GFR: 54.91 mL/min — ABNORMAL LOW (ref >60.00)
Sodium: 142 mEq/L (ref 135–145)

## 2011-10-27 MED ORDER — MOVIPREP 100 G PO SOLR: ORAL | Status: DC

## 2011-10-27 NOTE — Patient Instructions (Signed)
Your physician has requested that you go to the basement for the following lab work before leaving today: BMET  You have been scheduled for a colonoscopy with propofol. Please follow written instructions given to you at your visit today.  Please pick up your prep kit at the pharmacy within the next 1-3 days.

## 2011-10-27 NOTE — Progress Notes (Signed)
Quick Note:  Kidney function still mildly abnormal.  He should schedule an office visit with Dr. Yetta Barre to discuss, may need medications adjusted ______

## 2011-10-27 NOTE — Progress Notes (Signed)
Subjective:    Patient ID: Mike Orozco, male    DOB: 10/10/1961, 51 y.o.   MRN: [id number] Referred by: Etta Grandchild, MD  HPI Mr. Holzman is a very pleasant middle-aged African American man here because of anemia and no prior history of colonoscopy. He is a hemoglobin which is in the 10 range over the last year or so, his iron saturation has been around 20% with a high ferritin level. He's had a Hemoccult negative stool, his B12 and folate are normal. He has felt better on taking iron though his hemoglobin has not risen much. He has never had a screening colonoscopy.  He also has renal insufficiency with a creatinine in the 1.5-1.7 range though transiently it was normal, in the winter of 20 05/07/2012. However prior to that and more recently the creatinine has been elevated.  He also has a history of congestive heart failure, with a non-ischemic cardiomyopathy in 2011, he was a heavy drinker at that time. He now drinks a glass of wine a day, and his ejection fraction has completely recovered on echocardiogram of 2012 and he feels much better.  His GI review of systems is otherwise negative.  Medications, allergies, past medical history, past surgical history, family history and social history are reviewed and updated in the EMR.  Review of Systems Review of systems is positive for those things mentioned in the history of present illness. All other review of systems negative.    Objective:   Physical Exam General:  Well-developed, well-nourished and in no acute distress Eyes:  anicteric. ENT:   Mouth and posterior pharynx free of lesions.  Neck:   supple w/o thyromegaly or mass.  Lungs: Clear to auscultation bilaterally. Heart:  S1S2, no rubs, murmurs, gallops. Abdomen:  soft, non-tender, no hepatosplenomegaly, hernia, or mass and BS+.  Rectal: Deferred until colonoscopy Lymph:  no cervical or supraclavicular adenopathy. Extremities:   no edema Skin   no rash. Neuro:  A&O x 3.    Psych:  appropriate mood and  Affect.   Data Reviewed: Lab Results  Component Value Date   WBC 4.9 09/08/2011   HGB 10.4* 09/08/2011   HCT 31.7* 09/08/2011   MCV 92.3 09/08/2011   PLT 250.0 09/08/2011     Chemistry      Component Value Date/Time   NA 140 09/08/2011 0853   K 4.4 09/08/2011 0853   CL 107 09/08/2011 0853   CO2 25 09/08/2011 0853   BUN 31* 09/08/2011 0853   CREATININE 1.7* 09/08/2011 0853      Component Value Date/Time   CALCIUM 10.1 09/08/2011 0853   ALKPHOS 44 09/08/2011 0853   AST 35 09/08/2011 0853   ALT 35 09/08/2011 0853   BILITOT 0.4 09/08/2011 0853            Assessment & Plan:   1. Anemia of chronic disease   I think that the laboratory studies would support this is a diagnosis. He's had a chronic mild elevation in creatinine and reduction in GFR is probably the cause.   2. Chronic renal insufficiency   I was discussing this is a possible cause of this anemia and noted been mentioned in his past medical history notes, he seemed unaware of this. He is on an ACE inhibitor, it in 6 weeks since his last creatinine, he was concerned so we agreed to check of the met and I will confer with Dr. Yetta Barre about this further.   3. Special screening for malignant neoplasms, colon  I believe he is at average her routine risk for colorectal cancer. A screening colonoscopy will be arranged. The risks and benefits as well as alternatives of endoscopic procedure(s) have been discussed and reviewed. All questions answered. The patient agrees to proceed.    I appreciate the opportunity to care for this patient.   CC: Sanda Linger, MD

## 2011-11-03 ENCOUNTER — Ambulatory Visit: Payer: Managed Care, Other (non HMO) | Admitting: Internal Medicine

## 2011-11-10 ENCOUNTER — Ambulatory Visit: Payer: Managed Care, Other (non HMO) | Admitting: Internal Medicine

## 2011-11-10 DIAGNOSIS — Z0289 Encounter for other administrative examinations: Secondary | ICD-10-CM

## 2011-11-17 ENCOUNTER — Ambulatory Visit: Payer: Managed Care, Other (non HMO) | Admitting: *Deleted

## 2011-12-01 ENCOUNTER — Encounter: Payer: Self-pay | Admitting: Internal Medicine

## 2011-12-01 ENCOUNTER — Ambulatory Visit (INDEPENDENT_AMBULATORY_CARE_PROVIDER_SITE_OTHER): Payer: Managed Care, Other (non HMO) | Admitting: Internal Medicine

## 2011-12-01 VITALS — BP 106/70 | HR 56 | Temp 97.2°F | Resp 16

## 2011-12-01 DIAGNOSIS — I10 Essential (primary) hypertension: Secondary | ICD-10-CM

## 2011-12-01 DIAGNOSIS — N189 Chronic kidney disease, unspecified: Secondary | ICD-10-CM

## 2011-12-01 NOTE — Progress Notes (Signed)
Subjective:    Patient ID: Mike Orozco, male    DOB: 10/10/1961, 51 y.o.   MRN: [id number]  Hypertension This is a chronic problem. The current episode started more than 1 year ago. The problem has been gradually improving since onset. The problem is controlled. Pertinent negatives include no anxiety, blurred vision, chest pain, headaches, malaise/fatigue, neck pain, orthopnea, palpitations, peripheral edema, PND, shortness of breath or sweats. Past treatments include beta blockers, ACE inhibitors and diuretics. The current treatment provides significant improvement. There are no compliance problems.  Hypertensive end-organ damage includes kidney disease.      Review of Systems  Constitutional: Negative.  Negative for malaise/fatigue.  HENT: Negative.  Negative for neck pain.   Eyes: Negative.  Negative for blurred vision.  Respiratory: Negative.  Negative for shortness of breath.   Cardiovascular: Negative.  Negative for chest pain, palpitations, orthopnea and PND.  Gastrointestinal: Negative.   Genitourinary: Negative.   Musculoskeletal: Negative.   Skin: Negative.   Neurological: Negative.  Negative for headaches.  Hematological: Negative.   Psychiatric/Behavioral: Negative.        Objective:   Physical Exam  Vitals reviewed. Constitutional: He is oriented to person, place, and time. He appears well-developed and well-nourished. No distress.  HENT:  Head: Normocephalic and atraumatic.  Mouth/Throat: Oropharynx is clear and moist. No oropharyngeal exudate.  Eyes: Conjunctivae are normal. Right eye exhibits no discharge. Left eye exhibits no discharge. No scleral icterus.  Neck: Normal range of motion. Neck supple. No JVD present. No tracheal deviation present. No thyromegaly present.  Cardiovascular: Normal rate, regular rhythm, normal heart sounds and intact distal pulses.  Exam reveals no gallop and no friction rub.   No murmur heard. Pulmonary/Chest: Effort normal and  breath sounds normal. No stridor. No respiratory distress. He has no wheezes. He has no rales. He exhibits no tenderness.  Abdominal: Soft. Bowel sounds are normal. He exhibits no distension and no mass. There is no tenderness. There is no rebound and no guarding.  Musculoskeletal: Normal range of motion. He exhibits no edema and no tenderness.  Lymphadenopathy:    He has no cervical adenopathy.  Neurological: He is oriented to person, place, and time.  Skin: Skin is warm and dry. No rash noted. He is not diaphoretic. No erythema. No pallor.  Psychiatric: He has a normal mood and affect. His behavior is normal. Judgment and thought content normal.      Lab Results  Component Value Date   WBC 4.9 09/08/2011   HGB 10.4* 09/08/2011   HCT 31.7* 09/08/2011   PLT 250.0 09/08/2011   GLUCOSE 96 10/27/2011   CHOL 180 09/08/2011   TRIG 227.0* 09/08/2011   HDL 44.60 09/08/2011   LDLDIRECT 90.2 09/08/2011   LDLCALC  Value: 98        Total Cholesterol/HDL:CHD Risk Coronary Heart Disease Risk Table                     Men   Women  1/2 Average Risk   3.4   3.3  Average Risk       5.0   4.4  2 X Average Risk   9.6   7.1  3 X Average Risk  23.4   11.0        Use the calculated Patient Ratio above and the CHD Risk Table to determine the patient's CHD Risk.        ATP III CLASSIFICATION (LDL):  <100     mg/dL  Optimal  100-129  mg/dL   Near or Above                    Optimal  130-159  mg/dL   Borderline  161-096  mg/dL   High  >045     mg/dL   Very High 40/01/8118   ALT 35 09/08/2011   AST 35 09/08/2011   NA 142 10/27/2011   K 4.3 10/27/2011   CL 109 10/27/2011   CREATININE 1.7* 10/27/2011   BUN 28* 10/27/2011   CO2 26 10/27/2011   TSH 1.68 01/15/2011   PSA 0.91 06/17/2010   INR 1.11 02/05/2011   HGBA1C 5.5 07/28/2011      Assessment & Plan:

## 2011-12-01 NOTE — Assessment & Plan Note (Signed)
His BP is well controlled 

## 2011-12-01 NOTE — Assessment & Plan Note (Signed)
He has been referred to nephrology by his urologist

## 2011-12-01 NOTE — Patient Instructions (Signed)

## 2011-12-22 ENCOUNTER — Encounter: Payer: Managed Care, Other (non HMO) | Admitting: Internal Medicine

## 2012-01-09 ENCOUNTER — Telehealth: Payer: Self-pay

## 2012-01-09 NOTE — Telephone Encounter (Signed)
Patient stopped by office to inform MD that Dr Web has decreased his lisinopril from 40 mg to 20 mg due to low BP and renal function. Epic medication list updated

## 2012-01-12 ENCOUNTER — Ambulatory Visit (AMBULATORY_SURGERY_CENTER): Payer: Managed Care, Other (non HMO)

## 2012-01-12 ENCOUNTER — Encounter: Payer: Self-pay | Admitting: Internal Medicine

## 2012-01-12 VITALS — Ht 66.0 in | Wt 207.0 lb

## 2012-01-12 DIAGNOSIS — Z1211 Encounter for screening for malignant neoplasm of colon: Secondary | ICD-10-CM

## 2012-01-12 MED ORDER — NA SULFATE-K SULFATE-MG SULF 17.5-3.13-1.6 GM/177ML PO SOLN: 1.0000 | Freq: Once | ORAL | Status: DC

## 2012-02-02 ENCOUNTER — Encounter: Payer: Managed Care, Other (non HMO) | Admitting: Internal Medicine

## 2012-02-09 ENCOUNTER — Ambulatory Visit (AMBULATORY_SURGERY_CENTER): Payer: Managed Care, Other (non HMO) | Admitting: Internal Medicine

## 2012-02-09 ENCOUNTER — Encounter: Payer: Self-pay | Admitting: Internal Medicine

## 2012-02-09 VITALS — BP 130/78 | HR 72 | Temp 97.2°F | Resp 16 | Ht 66.0 in | Wt 207.0 lb

## 2012-02-09 DIAGNOSIS — D126 Benign neoplasm of colon, unspecified: Secondary | ICD-10-CM

## 2012-02-09 DIAGNOSIS — Z1211 Encounter for screening for malignant neoplasm of colon: Secondary | ICD-10-CM

## 2012-02-09 DIAGNOSIS — K5730 Diverticulosis of large intestine without perforation or abscess without bleeding: Secondary | ICD-10-CM

## 2012-02-09 LAB — HM COLONOSCOPY

## 2012-02-09 MED ORDER — SODIUM CHLORIDE 0.9 % IV SOLN: 500.0000 mL | INTRAVENOUS | Status: DC

## 2012-02-09 NOTE — Progress Notes (Signed)
Patient did not experience any of the following events: a burn prior to discharge; a fall within the facility; wrong site/side/patient/procedure/implant event; or a hospital transfer or hospital admission upon discharge from the facility. (G8907) Patient did not have preoperative order for IV antibiotic SSI prophylaxis. (G8918)  

## 2012-02-09 NOTE — Progress Notes (Signed)
Abdominal pressure applied to reach the cecum  Propofol given and oxygen managed per Myles Lipps

## 2012-02-09 NOTE — Op Note (Signed)
[facility] [address] [place], [address]   COLONOSCOPY PROCEDURE REPORT  PATIENT: [name], Mike Orozco  MR#: [id number] BIRTHDATE: 10/10/1961 , 50  yrs. old GENDER: Male ENDOSCOPIST: Iva Boop, MD, [facility] REFERRED VH:QIONGE Karsten Ro, M.D. PROCEDURE DATE:  02/09/2012 PROCEDURE:   Colonoscopy with biopsy ASA CLASS:   Class III INDICATIONS:average risk screening. MEDICATIONS: propofol (Diprivan) 300mg  IV, MAC sedation, administered by CRNA, and These medications were titrated to patient response per physician's verbal order  DESCRIPTION OF PROCEDURE:   After the risks benefits and alternatives of the procedure were thoroughly explained, informed consent was obtained.  A digital rectal exam revealed no abnormalities of the rectum and A digital rectal exam revealed the prostate was not enlarged.   The LB [id number]  endoscope was introduced through the anus and advanced to the cecum, which was identified by both the appendix and ileocecal valve. No adverse events experienced.   The quality of the prep was Suprep good  The instrument was then slowly withdrawn as the colon was fully examined.      COLON FINDINGS: A polypoid shaped sessile polyp measuring 2 mm in size was found in the ascending colon.  A polypectomy was performed with cold forceps.  The resection was complete and the polyp tissue was completely retrieved.   Severe diverticulosis was noted in the sigmoid colon.   The colon mucosa was otherwise normal. Retroflexed views revealed no abnormalities. The time to cecum=3 minutes 14 seconds.  Withdrawal time=11 minutes 04 seconds.  The scope was withdrawn and the procedure completed. COMPLICATIONS: There were no complications.  ENDOSCOPIC IMPRESSION: 1.   Sessile polyp measuring 2 mm in size was found in the ascending colon; polypectomy was performed with cold forceps 2.   Severe diverticulosis was noted in the sigmoid colon 3.   The colon  mucosa was otherwise normal , good prep  RECOMMENDATIONS: Timing of repeat colonoscopy will be determined by pathology findings.   eSigned:  Iva Boop, MD, [facility] 02/09/2012 2:59 PM   cc: Etta Grandchild, MD and The Patient

## 2012-02-09 NOTE — Patient Instructions (Addendum)
The colonoscopy showed one tiny polyp that I removed. You also have diverticulosis. Please read the handouts provided and I will let you know about the polyp results.  Thank you for choosing me and [facility].  Iva Boop, MD, FACG YOU HAD AN ENDOSCOPIC PROCEDURE TODAY AT THE [facility]: Refer to the procedure report that was given to you for any specific questions about what was found during the examination.  If the procedure report does not answer your questions, please call your gastroenterologist to clarify.  If you requested that your care partner not be given the details of your procedure findings, then the procedure report has been included in a sealed envelope for you to review at your convenience later.  YOU SHOULD EXPECT: Some feelings of bloating in the abdomen. Passage of more gas than usual.  Walking can help get rid of the air that was put into your GI tract during the procedure and reduce the bloating. If you had a lower endoscopy (such as a colonoscopy or flexible sigmoidoscopy) you may notice spotting of blood in your stool or on the toilet paper. If you underwent a bowel prep for your procedure, then you may not have a normal bowel movement for a few days.  DIET: Your first meal following the procedure should be a light meal and then it is ok to progress to your normal diet.  A half-sandwich or bowl of soup is an example of a good first meal.  Heavy or fried foods are harder to digest and may make you feel nauseous or bloated.  Likewise meals heavy in dairy and vegetables can cause extra gas to form and this can also increase the bloating.  Drink plenty of fluids but you should avoid alcoholic beverages for 24 hours.  ACTIVITY: Your care partner should take you home directly after the procedure.  You should plan to take it easy, moving slowly for the rest of the day.  You can resume normal activity the day after the procedure however you should NOT DRIVE  or use heavy machinery for 24 hours (because of the sedation medicines used during the test).    SYMPTOMS TO REPORT IMMEDIATELY: A gastroenterologist can be reached at any hour.  During normal business hours, 8:30 AM to 5:00 PM Monday through Friday, call [phone].  After hours and on weekends, please call the GI answering service at [phone] who will take a message and have the physician on call contact you.   Following lower endoscopy (colonoscopy or flexible sigmoidoscopy):  Excessive amounts of blood in the stool  Significant tenderness or worsening of abdominal pains  Swelling of the abdomen that is new, acute  Fever of 100F or higher  Following upper endoscopy (EGD)  Vomiting of blood or coffee ground material  New chest pain or pain under the shoulder blades  Painful or persistently difficult swallowing  New shortness of breath  Fever of 100F or higher  Black, tarry-looking stools  FOLLOW UP: If any biopsies were taken you will be contacted by phone or by letter within the next 1-3 weeks.  Call your gastroenterologist if you have not heard about the biopsies in 3 weeks.  Our staff will call the home number listed on your records the next business day following your procedure to check on you and address any questions or concerns that you may have at that time regarding the information given to you following your procedure. This is a courtesy call and  so if there is no answer at the home number and we have not heard from you through the emergency physician on call, we will assume that you have returned to your regular daily activities without incident.  SIGNATURES/CONFIDENTIALITY: You and/or your care partner have signed paperwork which will be entered into your electronic medical record.  These signatures attest to the fact that that the information above on your After Visit Summary has been reviewed and is understood.  Full responsibility of the confidentiality of this  discharge information lies with you and/or your care-partner.

## 2012-02-10 ENCOUNTER — Telehealth: Payer: Self-pay | Admitting: *Deleted

## 2012-02-10 ENCOUNTER — Other Ambulatory Visit: Payer: Self-pay | Admitting: Physician Assistant

## 2012-02-10 NOTE — Telephone Encounter (Signed)
  Follow up Call-  Call back number 02/09/2012  Post procedure Call Back phone  # [phone]  Permission to leave phone message Yes     [facility]

## 2012-02-17 ENCOUNTER — Encounter: Payer: Self-pay | Admitting: Internal Medicine

## 2012-02-17 DIAGNOSIS — Z860100 Personal history of colon polyps, unspecified: Secondary | ICD-10-CM

## 2012-02-17 HISTORY — DX: Personal history of colonic polyps: Z86.010

## 2012-02-17 HISTORY — DX: Personal history of colon polyps, unspecified: Z86.0100

## 2012-02-17 NOTE — Progress Notes (Signed)
Quick Note:  Diminutive adenoma Repeat colon around 02/2017-2019 ______

## 2012-02-23 ENCOUNTER — Encounter: Payer: Self-pay | Admitting: Internal Medicine

## 2012-02-23 ENCOUNTER — Other Ambulatory Visit (INDEPENDENT_AMBULATORY_CARE_PROVIDER_SITE_OTHER): Payer: Managed Care, Other (non HMO)

## 2012-02-23 ENCOUNTER — Ambulatory Visit (INDEPENDENT_AMBULATORY_CARE_PROVIDER_SITE_OTHER): Payer: Managed Care, Other (non HMO) | Admitting: Internal Medicine

## 2012-02-23 VITALS — BP 108/70 | HR 56 | Temp 98.1°F | Resp 16 | Wt 207.5 lb

## 2012-02-23 DIAGNOSIS — Z23 Encounter for immunization: Secondary | ICD-10-CM

## 2012-02-23 DIAGNOSIS — I5022 Chronic systolic (congestive) heart failure: Secondary | ICD-10-CM

## 2012-02-23 DIAGNOSIS — I10 Essential (primary) hypertension: Secondary | ICD-10-CM

## 2012-02-23 DIAGNOSIS — R7309 Other abnormal glucose: Secondary | ICD-10-CM

## 2012-02-23 DIAGNOSIS — M109 Gout, unspecified: Secondary | ICD-10-CM

## 2012-02-23 DIAGNOSIS — N189 Chronic kidney disease, unspecified: Secondary | ICD-10-CM

## 2012-02-23 DIAGNOSIS — J3089 Other allergic rhinitis: Secondary | ICD-10-CM

## 2012-02-23 DIAGNOSIS — D649 Anemia, unspecified: Secondary | ICD-10-CM

## 2012-02-23 LAB — COMPREHENSIVE METABOLIC PANEL
ALT: 30 U/L (ref 0–53)
AST: 33 U/L (ref 0–37)
Albumin: 4.4 g/dL (ref 3.5–5.2)
Alkaline Phosphatase: 37 U/L — ABNORMAL LOW (ref 39–117)
BUN: 30 mg/dL — ABNORMAL HIGH (ref 6–23)
Calcium: 9.7 mg/dL (ref 8.4–10.5)
Chloride: 106 mEq/L (ref 96–112)
Potassium: 4.2 mEq/L (ref 3.5–5.1)
Sodium: 138 mEq/L (ref 135–145)
Total Protein: 8.1 g/dL (ref 6.0–8.3)

## 2012-02-23 LAB — CBC WITH DIFFERENTIAL/PLATELET
Basophils Relative: 0.6 % (ref 0.0–3.0)
Eosinophils Relative: 2.9 % (ref 0.0–5.0)
Hemoglobin: 10.4 g/dL — ABNORMAL LOW (ref 13.0–17.0)
Lymphocytes Relative: 27.5 % (ref 12.0–46.0)
MCHC: 32.6 g/dL (ref 30.0–36.0)
Monocytes Relative: 8.1 % (ref 3.0–12.0)
Neutro Abs: 3.7 10*3/uL (ref 1.4–7.7)
RBC: 3.44 Mil/uL — ABNORMAL LOW (ref 4.22–5.81)
WBC: 6.1 10*3/uL (ref 4.5–10.5)

## 2012-02-23 LAB — URINALYSIS, ROUTINE W REFLEX MICROSCOPIC
Hgb urine dipstick: NEGATIVE
Ketones, ur: NEGATIVE
Total Protein, Urine: NEGATIVE
Urine Glucose: NEGATIVE

## 2012-02-23 LAB — TSH: TSH: 1.08 u[IU]/mL (ref 0.35–5.50)

## 2012-02-23 MED ORDER — METHYLPREDNISOLONE ACETATE 80 MG/ML IJ SUSP
120.0000 mg | Freq: Once | INTRAMUSCULAR | Status: AC
  Administered 2012-02-23: 120 mg via INTRAMUSCULAR

## 2012-02-23 MED ORDER — BECLOMETHASONE DIPROPIONATE 80 MCG/ACT NA AERS: 2.0000 | INHALATION_SPRAY | Freq: Every day | NASAL | Status: DC

## 2012-02-23 NOTE — Progress Notes (Signed)
Subjective:    Patient ID: Mike Orozco, male    DOB: 10/10/1961, 51 y.o.   MRN: [id number]  Hypertension This is a chronic problem. The current episode started more than 1 year ago. The problem is unchanged. The problem is controlled. Pertinent negatives include no anxiety, blurred vision, chest pain, headaches, malaise/fatigue, neck pain, orthopnea, palpitations, peripheral edema, PND, shortness of breath or sweats. Past treatments include diuretics, beta blockers and ACE inhibitors. The current treatment provides moderate improvement. There are no compliance problems.  Hypertensive end-organ damage includes kidney disease. Identifiable causes of hypertension include chronic renal disease.      Review of Systems  Constitutional: Negative for fever, chills, malaise/fatigue, diaphoresis, activity change, appetite change, fatigue and unexpected weight change.  HENT: Positive for congestion, rhinorrhea, sneezing and postnasal drip. Negative for ear pain, nosebleeds, sore throat, facial swelling, trouble swallowing, neck pain, voice change and sinus pressure.   Eyes: Negative.  Negative for blurred vision.  Respiratory: Negative for cough, choking, chest tightness, shortness of breath, wheezing and stridor.   Cardiovascular: Negative for chest pain, palpitations, orthopnea and PND.  Gastrointestinal: Negative for nausea, vomiting, abdominal pain, diarrhea, constipation and blood in stool.  Genitourinary: Negative.   Musculoskeletal: Negative for myalgias, back pain, joint swelling, arthralgias and gait problem.  Skin: Negative for color change, pallor, rash and wound.  Neurological: Negative for dizziness, tremors, seizures, syncope, facial asymmetry, speech difficulty, weakness, light-headedness, numbness and headaches.  Hematological: Negative for adenopathy. Does not bruise/bleed easily.  Psychiatric/Behavioral: Negative.        Objective:   Physical Exam  Vitals  reviewed. Constitutional: He is oriented to person, place, and time. He appears well-developed and well-nourished.  Non-toxic appearance. He does not have a sickly appearance. He does not appear ill. No distress.  HENT:  Head: Normocephalic and atraumatic. No trismus in the jaw.  Right Ear: Hearing, tympanic membrane, external ear and ear canal normal.  Left Ear: Hearing, tympanic membrane, external ear and ear canal normal.  Nose: Mucosal edema present. No rhinorrhea, sinus tenderness, nasal deformity, septal deviation or nasal septal hematoma. No epistaxis.  No foreign bodies. Right sinus exhibits no maxillary sinus tenderness and no frontal sinus tenderness. Left sinus exhibits no maxillary sinus tenderness and no frontal sinus tenderness.  Mouth/Throat: Oropharynx is clear and moist and mucous membranes are normal. Mucous membranes are not pale, not dry and not cyanotic. No oral lesions. No uvula swelling. No oropharyngeal exudate, posterior oropharyngeal edema, posterior oropharyngeal erythema or tonsillar abscesses.  Eyes: Conjunctivae normal are normal. Right eye exhibits no discharge. Left eye exhibits no discharge. No scleral icterus.  Neck: Normal range of motion. Neck supple. No JVD present. No tracheal deviation present. No thyromegaly present.  Cardiovascular: Normal rate, regular rhythm, normal heart sounds and intact distal pulses.  Exam reveals no gallop and no friction rub.   No murmur heard. Pulmonary/Chest: Effort normal and breath sounds normal. No stridor. No respiratory distress. He has no wheezes. He has no rales. He exhibits no tenderness.  Abdominal: Soft. Bowel sounds are normal. He exhibits no distension and no mass. There is no tenderness. There is no rebound and no guarding.  Musculoskeletal: Normal range of motion. He exhibits no edema and no tenderness.  Lymphadenopathy:    He has no cervical adenopathy.  Neurological: He is oriented to person, place, and time.  Skin:  Skin is warm and dry. No rash noted. He is not diaphoretic. No erythema. No pallor.  Psychiatric: He has a normal  mood and affect. His behavior is normal. Judgment and thought content normal.     Lab Results  Component Value Date   WBC 4.9 09/08/2011   HGB 10.4* 09/08/2011   HCT 31.7* 09/08/2011   PLT 250.0 09/08/2011   GLUCOSE 96 10/27/2011   CHOL 180 09/08/2011   TRIG 227.0* 09/08/2011   HDL 44.60 09/08/2011   LDLDIRECT 90.2 09/08/2011   LDLCALC  Value: 98        Total Cholesterol/HDL:CHD Risk Coronary Heart Disease Risk Table                     Men   Women  1/2 Average Risk   3.4   3.3  Average Risk       5.0   4.4  2 X Average Risk   9.6   7.1  3 X Average Risk  23.4   11.0        Use the calculated Patient Ratio above and the CHD Risk Table to determine the patient's CHD Risk.        ATP III CLASSIFICATION (LDL):  <100     mg/dL   Optimal  782-956  mg/dL   Near or Above                    Optimal  130-159  mg/dL   Borderline  213-086  mg/dL   High  >578     mg/dL   Very High 46/01/6294   ALT 35 09/08/2011   AST 35 09/08/2011   NA 142 10/27/2011   K 4.3 10/27/2011   CL 109 10/27/2011   CREATININE 1.7* 10/27/2011   BUN 28* 10/27/2011   CO2 26 10/27/2011   TSH 1.68 01/15/2011   PSA 0.91 06/17/2010   INR 1.11 02/05/2011   HGBA1C 5.5 07/28/2011       Assessment & Plan:

## 2012-02-23 NOTE — Assessment & Plan Note (Signed)
He has not had any flares, I will check his uric acid level and will monitor his renal function

## 2012-02-23 NOTE — Patient Instructions (Signed)
Allergic Rhinitis Allergic rhinitis is when the mucous membranes in the nose respond to allergens. Allergens are particles in the air that cause your body to have an allergic reaction. This causes you to release allergic antibodies. Through a chain of events, these eventually cause you to release histamine into the blood stream (hence the use of antihistamines). Although meant to be protective to the body, it is this release that causes your discomfort, such as frequent sneezing, congestion and an itchy runny nose.  CAUSES  The pollen allergens may come from grasses, trees, and weeds. This is seasonal allergic rhinitis, or "hay fever." Other allergens cause year-round allergic rhinitis (perennial allergic rhinitis) such as house dust mite allergen, pet dander and mold spores.  SYMPTOMS   Nasal stuffiness (congestion).  Runny, itchy nose with sneezing and tearing of the eyes.  There is often an itching of the mouth, eyes and ears. It cannot be cured, but it can be controlled with medications. DIAGNOSIS  If you are unable to determine the offending allergen, skin or blood testing may find it. TREATMENT   Avoid the allergen.  Medications and allergy shots (immunotherapy) can help.  Hay fever may often be treated with antihistamines in pill or nasal spray forms. Antihistamines block the effects of histamine. There are over-the-counter medicines that may help with nasal congestion and swelling around the eyes. Check with your caregiver before taking or giving this medicine. If the treatment above does not work, there are many new medications your caregiver can prescribe. Stronger medications may be used if initial measures are ineffective. Desensitizing injections can be used if medications and avoidance fails. Desensitization is when a patient is given ongoing shots until the body becomes less sensitive to the allergen. Make sure you follow up with your caregiver if problems continue. SEEK MEDICAL  CARE IF:   You develop fever (more than 100.5 F (38.1 C).  You develop a cough that does not stop easily (persistent).  You have shortness of breath.  You start wheezing.  Symptoms interfere with normal daily activities. Document Released: 01/28/2001 Document Revised: 07/28/2011 Document Reviewed: 08/09/2008 [facility] Patient Information 2013 [place], [place]. Hypertension As your heart beats, it forces blood through your arteries. This force is your blood pressure. If the pressure is too high, it is called hypertension (HTN) or high blood pressure. HTN is dangerous because you may have it and not know it. High blood pressure may mean that your heart has to work harder to pump blood. Your arteries may be narrow or stiff. The extra work puts you at risk for heart disease, stroke, and other problems.  Blood pressure consists of two numbers, a higher number over a lower, 110/72, for example. It is stated as "110 over 72." The ideal is below 120 for the top number (systolic) and under 80 for the bottom (diastolic). Write down your blood pressure today. You should pay close attention to your blood pressure if you have certain conditions such as:  Heart failure.  Prior heart attack.  Diabetes  Chronic kidney disease.  Prior stroke.  Multiple risk factors for heart disease. To see if you have HTN, your blood pressure should be measured while you are seated with your arm held at the level of the heart. It should be measured at least twice. A one-time elevated blood pressure reading (especially in the Emergency Department) does not mean that you need treatment. There may be conditions in which the blood pressure is different between your right and left  arms. It is important to see your caregiver soon for a recheck. Most people have essential hypertension which means that there is not a specific cause. This type of high blood pressure may be lowered by changing lifestyle factors such  as:  Stress.  Smoking.  Lack of exercise.  Excessive weight.  Drug/tobacco/alcohol use.  Eating less salt. Most people do not have symptoms from high blood pressure until it has caused damage to the body. Effective treatment can often prevent, delay or reduce that damage. TREATMENT  When a cause has been identified, treatment for high blood pressure is directed at the cause. There are a large number of medications to treat HTN. These fall into several categories, and your caregiver will help you select the medicines that are best for you. Medications may have side effects. You should review side effects with your caregiver. If your blood pressure stays high after you have made lifestyle changes or started on medicines,   Your medication(s) may need to be changed.  Other problems may need to be addressed.  Be certain you understand your prescriptions, and know how and when to take your medicine.  Be sure to follow up with your caregiver within the time frame advised (usually within two weeks) to have your blood pressure rechecked and to review your medications.  If you are taking more than one medicine to lower your blood pressure, make sure you know how and at what times they should be taken. Taking two medicines at the same time can result in blood pressure that is too low. SEEK IMMEDIATE MEDICAL CARE IF:  You develop a severe headache, blurred or changing vision, or confusion.  You have unusual weakness or numbness, or a faint feeling.  You have severe chest or abdominal pain, vomiting, or breathing problems. MAKE SURE YOU:   Understand these instructions.  Will watch your condition.  Will get help right away if you are not doing well or get worse. Document Released: 05/05/2005 Document Revised: 07/28/2011 Document Reviewed: 12/24/2007 [facility] Patient Information 2013 [place], [place].

## 2012-02-23 NOTE — Assessment & Plan Note (Signed)
CBC today.  

## 2012-02-23 NOTE — Assessment & Plan Note (Signed)
His BP is well controlled, I will check his lytes and renal function 

## 2012-02-23 NOTE — Assessment & Plan Note (Signed)
He has had a flare of symptoms so I gave him an injection of depo-medrol IM and have asked him to start Qnasl NS

## 2012-02-23 NOTE — Assessment & Plan Note (Signed)
He has a normal fluid status today 

## 2012-02-23 NOTE — Assessment & Plan Note (Signed)
I will monitor his renal function today 

## 2012-03-08 ENCOUNTER — Telehealth: Payer: Self-pay

## 2012-03-08 ENCOUNTER — Ambulatory Visit: Payer: Managed Care, Other (non HMO) | Admitting: Internal Medicine

## 2012-03-08 DIAGNOSIS — M545 Low back pain: Secondary | ICD-10-CM

## 2012-03-08 MED ORDER — METHOCARBAMOL 500 MG PO TABS: 500.0000 mg | ORAL_TABLET | Freq: Four times a day (QID) | ORAL | Status: DC

## 2012-03-08 NOTE — Telephone Encounter (Signed)
done

## 2012-03-08 NOTE — Telephone Encounter (Signed)
Patient called c/o painful muscle spams (back) that causes sleeping difficulty, pain when lifting. He is requesting rx to help with spasms.

## 2012-03-11 ENCOUNTER — Other Ambulatory Visit: Payer: Self-pay | Admitting: Internal Medicine

## 2012-03-15 ENCOUNTER — Encounter: Payer: Self-pay | Admitting: Internal Medicine

## 2012-03-15 ENCOUNTER — Ambulatory Visit (INDEPENDENT_AMBULATORY_CARE_PROVIDER_SITE_OTHER)
Admission: RE | Admit: 2012-03-15 | Discharge: 2012-03-15 | Disposition: A | Discharge status: Home / Self Care | Payer: Managed Care, Other (non HMO) | Source: Ambulatory Visit | Attending: Internal Medicine | Admitting: Internal Medicine

## 2012-03-15 ENCOUNTER — Ambulatory Visit: Payer: Managed Care, Other (non HMO) | Admitting: Internal Medicine

## 2012-03-15 ENCOUNTER — Other Ambulatory Visit (INDEPENDENT_AMBULATORY_CARE_PROVIDER_SITE_OTHER): Payer: Managed Care, Other (non HMO)

## 2012-03-15 ENCOUNTER — Ambulatory Visit (INDEPENDENT_AMBULATORY_CARE_PROVIDER_SITE_OTHER): Payer: Managed Care, Other (non HMO) | Admitting: Internal Medicine

## 2012-03-15 VITALS — BP 104/68 | HR 51 | Temp 97.4°F | Resp 16 | Wt 205.2 lb

## 2012-03-15 DIAGNOSIS — R109 Unspecified abdominal pain: Secondary | ICD-10-CM

## 2012-03-15 DIAGNOSIS — N200 Calculus of kidney: Secondary | ICD-10-CM

## 2012-03-15 DIAGNOSIS — N189 Chronic kidney disease, unspecified: Secondary | ICD-10-CM

## 2012-03-15 DIAGNOSIS — I10 Essential (primary) hypertension: Secondary | ICD-10-CM

## 2012-03-15 LAB — URINALYSIS, ROUTINE W REFLEX MICROSCOPIC
Ketones, ur: NEGATIVE
Specific Gravity, Urine: 1.02 (ref 1.000–1.030)
Total Protein, Urine: NEGATIVE
Urine Glucose: NEGATIVE
pH: 6 (ref 5.0–8.0)

## 2012-03-15 LAB — BASIC METABOLIC PANEL
Calcium: 9.8 mg/dL (ref 8.4–10.5)
GFR: 46.25 mL/min — ABNORMAL LOW (ref >60.00)
Potassium: 4.2 mEq/L (ref 3.5–5.1)
Sodium: 139 mEq/L (ref 135–145)

## 2012-03-15 NOTE — Assessment & Plan Note (Signed)
His BP is well controlled 

## 2012-03-15 NOTE — Assessment & Plan Note (Signed)
I will recheck his renal function today 

## 2012-03-15 NOTE — Progress Notes (Signed)
Subjective:    Patient ID: Mike Orozco, male    DOB: 10/10/1961, 51 y.o.   MRN: [id number]  Flank Pain This is a recurrent problem. The current episode started more than 1 month ago. The problem occurs intermittently. The problem is unchanged. Pain location: bilateral flank area. The quality of the pain is described as stabbing and shooting. The pain does not radiate. The pain is at a severity of 2/10. The pain is mild. The pain is the same all the time. The symptoms are aggravated by position. Pertinent negatives include no abdominal pain, bladder incontinence, bowel incontinence, chest pain, dysuria, fever, headaches, leg pain, numbness, paresis, paresthesias, pelvic pain, perianal numbness, tingling, weakness or weight loss. He has tried muscle relaxant for the symptoms. The treatment provided significant relief.      Review of Systems  Constitutional: Negative for fever, chills, weight loss, diaphoresis, activity change, appetite change, fatigue and unexpected weight change.  HENT: Negative.   Eyes: Negative.   Respiratory: Negative for cough, chest tightness, shortness of breath, wheezing and stridor.   Cardiovascular: Negative for chest pain, palpitations and leg swelling.  Gastrointestinal: Negative for nausea, vomiting, abdominal pain, diarrhea, constipation and bowel incontinence.  Genitourinary: Positive for flank pain. Negative for bladder incontinence, dysuria, urgency, frequency, hematuria, decreased urine volume, discharge, penile swelling, scrotal swelling, enuresis, difficulty urinating, genital sores, penile pain, testicular pain and pelvic pain.  Musculoskeletal: Negative for myalgias, back pain, joint swelling, arthralgias and gait problem.  Skin: Negative for color change, pallor, rash and wound.  Neurological: Negative.  Negative for tingling, weakness, numbness, headaches and paresthesias.  Hematological: Negative for adenopathy. Does not bruise/bleed easily.    Psychiatric/Behavioral: Negative.        Objective:   Physical Exam  Vitals reviewed. Constitutional: He is oriented to person, place, and time. He appears well-developed and well-nourished.  Non-toxic appearance. He does not have a sickly appearance. He does not appear ill. No distress.  HENT:  Head: Normocephalic and atraumatic.  Mouth/Throat: Oropharynx is clear and moist. No oropharyngeal exudate.  Eyes: Conjunctivae normal are normal. Right eye exhibits no discharge. Left eye exhibits no discharge. No scleral icterus.  Neck: Normal range of motion. Neck supple. No JVD present. No tracheal deviation present. No thyromegaly present.  Cardiovascular: Normal rate, regular rhythm, normal heart sounds and intact distal pulses.  Exam reveals no gallop and no friction rub.   No murmur heard. Pulmonary/Chest: Effort normal and breath sounds normal. No stridor. No respiratory distress. He has no wheezes. He has no rales. He exhibits no tenderness.  Abdominal: Soft. Normal appearance and bowel sounds are normal. He exhibits no shifting dullness, no distension, no pulsatile liver, no fluid wave, no abdominal bruit, no ascites, no pulsatile midline mass and no mass. There is no hepatosplenomegaly, splenomegaly or hepatomegaly. There is no tenderness. There is no rigidity, no rebound, no guarding, no CVA tenderness, no tenderness at McBurney's point and negative Murphy's sign. No hernia. Hernia confirmed negative in the ventral area, confirmed negative in the right inguinal area and confirmed negative in the left inguinal area.  Musculoskeletal: Normal range of motion. He exhibits no edema and no tenderness.  Lymphadenopathy:    He has no cervical adenopathy.  Neurological: He is oriented to person, place, and time.  Skin: Skin is warm and dry. No rash noted. He is not diaphoretic. No erythema. No pallor.  Psychiatric: He has a normal mood and affect. His behavior is normal. Judgment and thought  content normal.  Lab Results  Component Value Date   WBC 6.1 02/23/2012   HGB 10.4* 02/23/2012   HCT 32.0* 02/23/2012   PLT 289.0 02/23/2012   GLUCOSE 98 02/23/2012   CHOL 180 09/08/2011   TRIG 227.0* 09/08/2011   HDL 44.60 09/08/2011   LDLDIRECT 90.2 09/08/2011   LDLCALC  Value: 98        Total Cholesterol/HDL:CHD Risk Coronary Heart Disease Risk Table                     Men   Women  1/2 Average Risk   3.4   3.3  Average Risk       5.0   4.4  2 X Average Risk   9.6   7.1  3 X Average Risk  23.4   11.0        Use the calculated Patient Ratio above and the CHD Risk Table to determine the patient's CHD Risk.        ATP III CLASSIFICATION (LDL):  <100     mg/dL   Optimal  865-784  mg/dL   Near or Above                    Optimal  130-159  mg/dL   Borderline  696-295  mg/dL   High  >284     mg/dL   Very High 13/06/4399   ALT 30 02/23/2012   AST 33 02/23/2012   NA 138 02/23/2012   K 4.2 02/23/2012   CL 106 02/23/2012   CREATININE 1.7* 02/23/2012   BUN 30* 02/23/2012   CO2 26 02/23/2012   TSH 1.08 02/23/2012   PSA 0.91 06/17/2010   INR 1.11 02/05/2011   HGBA1C 5.5 07/28/2011      Assessment & Plan:

## 2012-03-15 NOTE — Patient Instructions (Addendum)
Flank Pain  Flank pain refers to pain that is located on the side of the body between the upper abdomen and the back. It can be caused by many things.  CAUSES   Some of the more common causes of flank pain include:   Muscle strain.   Muscle spasms.   A disease of your spine (vertebral disk disease).   A lung infection (pneumonia).   Fluid around your lungs (pulmonary edema).   A kidney infection.   Kidney stones.   A very painful skin rash on only one side of your body (shingles).   Gallbladder disease.  DIAGNOSIS   Blood tests, urine tests, and X-rays may help your caregiver determine what is wrong.  TREATMENT   The treatment of pain depends on the cause. Your caregiver will determine what treatment will work best for you.  HOME CARE INSTRUCTIONS    Home care will depend on the cause of your pain.   Some medications may help relieve the pain. Take medication for relief of pain as directed by your caregiver.   Tell your caregiver about any changes in your pain.   Follow up with your caregiver.  SEEK IMMEDIATE MEDICAL CARE IF:    Your pain is not controlled with medication.   The pain increases.   You have abdominal pain.   You have shortness of breath.   You have persistent nausea or vomiting.   You have swelling in your abdomen.   You feel faint or pass out.   You have a temperature by mouth above 102 F (38.9 C), not controlled by medicine.  MAKE SURE YOU:    Understand these instructions.   Will watch your condition.   Will get help right away if you are not doing well or get worse.  Document Released: 06/26/2005 Document Revised: 07/28/2011 Document Reviewed: 10/20/2009  [employer] Patient Information 2013 [employer].

## 2012-03-15 NOTE — Assessment & Plan Note (Signed)
I will recheck plain films of his abd and will check his renal function and UA, I don't see any acute problems today, he is getting pain relief with muscle relaxers

## 2012-03-15 NOTE — Assessment & Plan Note (Signed)
I will check his KUB today

## 2012-03-22 ENCOUNTER — Telehealth: Payer: Self-pay | Admitting: Internal Medicine

## 2012-03-22 NOTE — Telephone Encounter (Signed)
Patient calling, still having the pain/cramping in his left side.  Was seen in the office last week for same. Also has similar pain in his right side and mid abdominal area.  Was given Methocarbalol 500mg  1 po qid.  He has only been taking it bid.  When I asked why he wasn't taking it as directed and he said that he didn't know he was to take it four times a day.  I asked him to repeat the instructions and he reread the instructions and then voiced an understanding and will start taking same qid.  He will do this x 48 hours and will call if no improvement.  He would like his lab results faxed to Dr. Hyman Hopes his nephralogist.

## 2012-03-25 ENCOUNTER — Other Ambulatory Visit: Payer: Self-pay | Admitting: Cardiology

## 2012-03-26 ENCOUNTER — Other Ambulatory Visit: Payer: Self-pay | Admitting: Urology

## 2012-03-26 DIAGNOSIS — N281 Cyst of kidney, acquired: Secondary | ICD-10-CM

## 2012-04-30 ENCOUNTER — Ambulatory Visit
Admission: RE | Admit: 2012-04-30 | Discharge: 2012-04-30 | Disposition: A | Discharge status: Home / Self Care | Payer: Managed Care, Other (non HMO) | Source: Ambulatory Visit | Attending: Urology | Admitting: Urology

## 2012-04-30 DIAGNOSIS — N281 Cyst of kidney, acquired: Secondary | ICD-10-CM

## 2012-04-30 MED ORDER — GADOBENATE DIMEGLUMINE 529 MG/ML IV SOLN
19.0000 mL | Freq: Once | INTRAVENOUS | Status: AC | PRN
  Administered 2012-04-30: 19 mL via INTRAVENOUS

## 2012-05-10 ENCOUNTER — Encounter: Payer: Self-pay | Admitting: Internal Medicine

## 2012-05-10 ENCOUNTER — Ambulatory Visit (INDEPENDENT_AMBULATORY_CARE_PROVIDER_SITE_OTHER): Payer: Managed Care, Other (non HMO) | Admitting: Internal Medicine

## 2012-05-10 ENCOUNTER — Other Ambulatory Visit (INDEPENDENT_AMBULATORY_CARE_PROVIDER_SITE_OTHER): Payer: Managed Care, Other (non HMO)

## 2012-05-10 VITALS — BP 130/70 | HR 97 | Temp 97.4°F | Resp 16 | Wt 217.0 lb

## 2012-05-10 DIAGNOSIS — E781 Pure hyperglyceridemia: Secondary | ICD-10-CM

## 2012-05-10 DIAGNOSIS — I428 Other cardiomyopathies: Secondary | ICD-10-CM

## 2012-05-10 DIAGNOSIS — I10 Essential (primary) hypertension: Secondary | ICD-10-CM

## 2012-05-10 DIAGNOSIS — N189 Chronic kidney disease, unspecified: Secondary | ICD-10-CM

## 2012-05-10 LAB — BASIC METABOLIC PANEL WITH GFR
BUN: 18 mg/dL (ref 6–23)
CO2: 26 meq/L (ref 19–32)
Calcium: 9.5 mg/dL (ref 8.4–10.5)
Chloride: 105 meq/L (ref 96–112)
Creatinine, Ser: 1.5 mg/dL (ref 0.4–1.5)
GFR: 64.3 mL/min
Glucose, Bld: 99 mg/dL (ref 70–99)
Potassium: 4.1 meq/L (ref 3.5–5.1)
Sodium: 137 meq/L (ref 135–145)

## 2012-05-10 LAB — LIPID PANEL
LDL Cholesterol: 83 mg/dL (ref 0–99)
Total CHOL/HDL Ratio: 4
VLDL: 35.2 mg/dL (ref 0.0–40.0)

## 2012-05-10 NOTE — Patient Instructions (Signed)

## 2012-05-10 NOTE — Progress Notes (Signed)
Subjective:    Patient ID: Mike Orozco, male    DOB: 10/10/1961, 51 y.o.   MRN: [id number]  Hypertension This is a chronic problem. The current episode started more than 1 year ago. The problem has been gradually improving since onset. The problem is controlled. Pertinent negatives include no anxiety, blurred vision, chest pain, headaches, malaise/fatigue, neck pain, orthopnea, palpitations, peripheral edema, PND, shortness of breath or sweats. Past treatments include diuretics, beta blockers and ACE inhibitors. The current treatment provides moderate improvement. Compliance problems include exercise and diet.  Hypertensive end-organ damage includes kidney disease. Identifiable causes of hypertension include chronic renal disease.      Review of Systems  Constitutional: Negative.  Negative for malaise/fatigue.  HENT: Negative for neck pain.   Eyes: Negative.  Negative for blurred vision.  Respiratory: Negative.  Negative for shortness of breath.   Cardiovascular: Negative.  Negative for chest pain, palpitations, orthopnea and PND.  Gastrointestinal: Negative.   Genitourinary: Negative.   Musculoskeletal: Negative.   Skin: Negative.   Neurological: Negative.  Negative for headaches.  Hematological: Negative.   Psychiatric/Behavioral: Negative.        Objective:   Physical Exam  Vitals reviewed. Constitutional: He is oriented to person, place, and time. He appears well-developed and well-nourished. No distress.  HENT:  Head: Normocephalic and atraumatic.  Mouth/Throat: Oropharynx is clear and moist. No oropharyngeal exudate.  Eyes: Conjunctivae normal are normal. Right eye exhibits no discharge. Left eye exhibits no discharge. No scleral icterus.  Neck: Normal range of motion. Neck supple. No JVD present. No tracheal deviation present. No thyromegaly present.  Cardiovascular: Normal rate, regular rhythm, normal heart sounds and intact distal pulses.  Exam reveals no gallop and no  friction rub.   No murmur heard. Pulmonary/Chest: Effort normal and breath sounds normal. No stridor. No respiratory distress. He has no wheezes. He has no rales. He exhibits no tenderness.  Abdominal: Soft. Bowel sounds are normal. He exhibits no distension and no mass. There is no tenderness. There is no rebound and no guarding.  Musculoskeletal: Normal range of motion. He exhibits no edema and no tenderness.  Lymphadenopathy:    He has no cervical adenopathy.  Neurological: He is oriented to person, place, and time.  Skin: Skin is warm and dry. No rash noted. He is not diaphoretic. No erythema. No pallor.  Psychiatric: He has a normal mood and affect. His behavior is normal. Judgment and thought content normal.     Lab Results  Component Value Date   WBC 6.1 02/23/2012   HGB 10.4* 02/23/2012   HCT 32.0* 02/23/2012   PLT 289.0 02/23/2012   GLUCOSE 108* 03/15/2012   CHOL 180 09/08/2011   TRIG 227.0* 09/08/2011   HDL 44.60 09/08/2011   LDLDIRECT 90.2 09/08/2011   LDLCALC  Value: 98        Total Cholesterol/HDL:CHD Risk Coronary Heart Disease Risk Table                     Men   Women  1/2 Average Risk   3.4   3.3  Average Risk       5.0   4.4  2 X Average Risk   9.6   7.1  3 X Average Risk  23.4   11.0        Use the calculated Patient Ratio above and the CHD Risk Table to determine the patient's CHD Risk.        ATP III CLASSIFICATION (LDL):  <  100     mg/dL   Optimal  846-962  mg/dL   Near or Above                    Optimal  130-159  mg/dL   Borderline  952-841  mg/dL   High  >324     mg/dL   Very High 40/05/270   ALT 30 02/23/2012   AST 33 02/23/2012   NA 139 03/15/2012   K 4.2 03/15/2012   CL 105 03/15/2012   CREATININE 2.0* 03/15/2012   BUN 48* 03/15/2012   CO2 25 03/15/2012   TSH 1.08 02/23/2012   PSA 0.91 06/17/2010   INR 1.11 02/05/2011   HGBA1C 5.5 07/28/2011       Assessment & Plan:

## 2012-05-16 NOTE — Assessment & Plan Note (Signed)
His BP is well controlled I will check his lytes and renal function today 

## 2012-05-16 NOTE — Assessment & Plan Note (Signed)
He looks good today and has a normal volume status

## 2012-05-16 NOTE — Assessment & Plan Note (Signed)
Recheck the FLP today 

## 2012-05-16 NOTE — Assessment & Plan Note (Signed)
His renal function is stable 

## 2012-05-20 ENCOUNTER — Other Ambulatory Visit: Payer: Self-pay | Admitting: Cardiology

## 2012-05-25 ENCOUNTER — Telehealth: Payer: Self-pay | Admitting: Internal Medicine

## 2012-05-25 DIAGNOSIS — M109 Gout, unspecified: Secondary | ICD-10-CM

## 2012-05-25 MED ORDER — ALLOPURINOL 300 MG PO TABS: 300.0000 mg | ORAL_TABLET | Freq: Every day | ORAL | Status: DC

## 2012-05-25 NOTE — Telephone Encounter (Signed)
done

## 2012-05-25 NOTE — Telephone Encounter (Signed)
Pt.notified

## 2012-05-25 NOTE — Telephone Encounter (Signed)
Patient needs a refill on Uloric but his insurance prefers that he use Allopurinol, this medication only comes in 100 or 300 mg tabs and he needs the appropriate dosage sent in to [facility], he only has two days left of his Uloric before he needs to start the Allopurinol

## 2012-06-04 ENCOUNTER — Telehealth: Payer: Self-pay | Admitting: Internal Medicine

## 2012-06-04 NOTE — Telephone Encounter (Signed)
Pt has changed pharmacy to [facility] [address] in [place].

## 2012-06-04 NOTE — Telephone Encounter (Signed)
Information updated in Epic. 

## 2012-06-23 ENCOUNTER — Telehealth: Payer: Self-pay

## 2012-06-23 NOTE — Telephone Encounter (Signed)
Received form, completed and medication now approved until 06/23/13. Pharmacy notified via fax

## 2012-06-25 ENCOUNTER — Other Ambulatory Visit: Payer: Self-pay

## 2012-06-25 DIAGNOSIS — J3089 Other allergic rhinitis: Secondary | ICD-10-CM

## 2012-06-25 MED ORDER — BECLOMETHASONE DIPROPIONATE 80 MCG/ACT NA AERS: 2.0000 | INHALATION_SPRAY | Freq: Every day | NASAL | Status: DC

## 2012-07-05 ENCOUNTER — Encounter: Payer: Self-pay | Admitting: Cardiology

## 2012-07-06 ENCOUNTER — Telehealth: Payer: Self-pay | Admitting: Internal Medicine

## 2012-07-06 NOTE — Telephone Encounter (Signed)
LMOM to call us back.

## 2012-07-06 NOTE — Telephone Encounter (Signed)
Please inform pt on chart review, he has had NO significant elevated blood sugars, and no evidence for DM in the past yr.  We cannot offer appt today, but would be glad to see at first available.

## 2012-07-06 NOTE — Telephone Encounter (Signed)
Mike Orozco, At this point, I would suggest he go to the ER. We are just a little too busy today. Mike Orozco

## 2012-07-06 NOTE — Telephone Encounter (Signed)
The patient called hoping to get an appointment for elevated blood sugar.  He was informed his PCP was not in the office this week, but was offered an appointment tomorrow (2/19) with the NP and several other MD's.  He stated he did not want to wait till tomorrow for an appointment.  I offered to check other offices, but the pt became upset stating he wanted to come to the [place] office.  Do you want him worked in to your afternoon schedule as an add on?   I informed the patient I would call him back to inform him if we could work him to this office.  Thanks!

## 2012-07-07 ENCOUNTER — Other Ambulatory Visit (INDEPENDENT_AMBULATORY_CARE_PROVIDER_SITE_OTHER): Payer: Managed Care, Other (non HMO)

## 2012-07-07 ENCOUNTER — Ambulatory Visit (INDEPENDENT_AMBULATORY_CARE_PROVIDER_SITE_OTHER): Payer: Managed Care, Other (non HMO) | Admitting: Internal Medicine

## 2012-07-07 ENCOUNTER — Encounter: Payer: Self-pay | Admitting: Internal Medicine

## 2012-07-07 VITALS — BP 102/72 | HR 72 | Temp 97.9°F | Ht 66.0 in | Wt 210.0 lb

## 2012-07-07 DIAGNOSIS — R739 Hyperglycemia, unspecified: Secondary | ICD-10-CM

## 2012-07-07 LAB — HEPATIC FUNCTION PANEL
ALT: 29 U/L (ref 0–53)
AST: 24 U/L (ref 0–37)
Alkaline Phosphatase: 59 U/L (ref 39–117)
Bilirubin, Direct: 0.1 mg/dL (ref 0.0–0.3)
Total Protein: 7.9 g/dL (ref 6.0–8.3)

## 2012-07-07 LAB — CBC
Platelets: 233 10*3/uL (ref 150.0–400.0)
RBC: 3.68 Mil/uL — ABNORMAL LOW (ref 4.22–5.81)
WBC: 4.5 10*3/uL (ref 4.5–10.5)

## 2012-07-07 LAB — POCT URINALYSIS DIPSTICK
Bilirubin, UA: NEGATIVE
Glucose, UA: 2
Ketones, UA: NEGATIVE
Leukocytes, UA: NEGATIVE

## 2012-07-07 LAB — BASIC METABOLIC PANEL
BUN: 50 mg/dL — ABNORMAL HIGH (ref 6–23)
Chloride: 94 mEq/L — ABNORMAL LOW (ref 96–112)
GFR: 39.83 mL/min — ABNORMAL LOW (ref >60.00)
Glucose, Bld: 463 mg/dL — ABNORMAL HIGH (ref 70–99)
Potassium: 4.9 mEq/L (ref 3.5–5.1)

## 2012-07-07 LAB — LIPID PANEL
Total CHOL/HDL Ratio: 5
VLDL: 181.6 mg/dL — ABNORMAL HIGH (ref 0.0–40.0)

## 2012-07-07 LAB — LDL CHOLESTEROL, DIRECT: Direct LDL: 38.6 mg/dL

## 2012-07-07 MED ORDER — ONETOUCH DELICA LANCETS 33G MISC: 1.0000 | Freq: Every day | Status: DC

## 2012-07-07 MED ORDER — METFORMIN HCL 500 MG PO TABS: 500.0000 mg | ORAL_TABLET | Freq: Two times a day (BID) | ORAL | Status: DC

## 2012-07-07 MED ORDER — GLUCOSE BLOOD VI STRP: ORAL_STRIP | Status: DC

## 2012-07-07 NOTE — Progress Notes (Addendum)
Subjective:    Patient ID: Mike Orozco, male    DOB: 10/10/1961, 52 y.o.   MRN: [id number]  HPI  Pt presents to the clinic today with c/o of elevated blood sugar. He was told by his kidney doctor on Monday that he needed to come be evaluated for high glucose in his urine. Pt has never had a problem with high blood sugar in the past. His mother and father both have type 2 diabetes. He has had increased thirst and increased urination but no other s/s of diabetes.  Review of Systems  Past Medical History  Diagnosis Date  . Hypertension   . Heart failure, chronic systolic   . Non-ischemic cardiomyopathy     a) Echo 03/25/2010: EF 20-25%; mild AI; Mod MR; Mild LAE  b)cardiac cath 03/26/2010: normal cors; severe pulmonary HTN;PCWP 41; EF 10-15%;  c. echo 4/12:  EF 45-50%, mild LVH, grade 1 diast dysfxn, mild AI, mild LAE  . History of ETOH abuse   . Gout, unspecified   . Pure hyperglyceridemia   . Tobacco use disorder   . Anemia   . Chronic renal insufficiency   . Personal history of colonic polyps-adenoma 02/17/2012    01/2012 - diminutive adenoma Repeat colon about 01/2017      Current Outpatient Prescriptions  Medication Sig Dispense Refill  . allopurinol (ZYLOPRIM) 300 MG tablet Take 1 tablet (300 mg total) by mouth daily.  90 tablet  3  . aspirin 81 MG EC tablet Take 81 mg by mouth daily.        . Beclomethasone Dipropionate (QNASL) 80 MCG/ACT AERS Place 2 puffs into the nose daily.  8.7 g  11  . carvedilol (COREG) 25 MG tablet TAKE ONE TABLET BY MOUTH TWICE A DAY  60 tablet  11  . ferrous sulfate 325 (65 FE) MG tablet Take 325 mg by mouth daily with breakfast.      . LIPOFEN 150 MG CAPS TAKE 1 CAPSULE (150 MG TOTAL) BY MOUTH DAILY.  30 capsule  5  . methocarbamol (ROBAXIN) 500 MG tablet Take 1 tablet (500 mg total) by mouth 4 (four) times daily.  75 tablet  1  . Multiple Vitamin (MULTIVITAMIN) capsule Take 1 capsule by mouth daily.      . pravastatin (PRAVACHOL) 40 MG tablet TAKE 1  TABLET BY MOUTH EVERY EVENING  30 tablet  11  . spironolactone (ALDACTONE) 25 MG tablet TAKE 1 TABLET BY MOUTH EVERY DAY  30 tablet  6  . lisinopril (PRINIVIL,ZESTRIL) 40 MG tablet TAKE 1 TABLET BY MOUTH DAILY  90 tablet  3   No current facility-administered medications for this visit.    No Known Allergies  Family History  Problem Relation Age of Onset  . Diabetes Mother   . Diabetes Father   . Hypertension Mother   . Hypertension Father   . Colon cancer Neg Hx     History   Social History  . Marital Status: Married    Spouse Name: N/A    Number of Children: 1  . Years of Education: N/A   Occupational History  . 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 Smoking status: Former Smoker    Types: Cigarettes    Quit date: 02/16/2010  . Smokeless tobacco: Never Used  . Alcohol Use: No     Comment: Quit 02/2010  . Drug Use: Yes  . Sexually Active: Yes    Birth Control/ Protection:  Condom   Other Topics Concern  . Not on file   Social History Narrative   No regular exercise   Daily caffeine           Constitutional: Pt reports fatigue. Denies fever, malaise, headache or abrupt weight changes.  HEENT: Denies eye pain, eye redness, ear pain, ringing in the ears, wax buildup, runny nose, nasal congestion, bloody nose, or sore throat. Gastrointestinal: Denies abdominal pain, bloating, constipation, diarrhea or blood in the stool.  GU: Pt reports increased thirst and frequency. Denies urgency, pain with urination, burning sensation, blood in urine, odor or discharge. Skin: Denies redness, rashes, lesions or ulcercations.  Neurological: Denies numbness or tingling in the hands or feet, dizziness, difficulty with memory, difficulty with speech or problems with balance and coordination.   No other specific complaints in a complete review of systems (except as listed in HPI above).     Objective:   Physical Exam   BP 102/72  Pulse 72  Temp(Src)  97.9 F (36.6 C) (Oral)  Ht 5\' 6"  (1.676 m)  Wt 210 lb (95.255 kg)  BMI 33.91 kg/m2  SpO2 95% Wt Readings from Last 3 Encounters:  07/07/12 210 lb (95.255 kg)  05/10/12 217 lb (98.431 kg)  03/15/12 205 lb 4 oz (93.101 kg)    General: Appears his stated age, obese but well developed, well nourished in NAD. Skin: Warm, dry and intact. No rashes, lesions or ulcerations noted. HEENT: Head: normal shape and size; Eyes: sclera white, no icterus, conjunctiva pink, PERRLA and EOMs intact; Ears: Tm's gray and intact, normal light reflex; Nose: mucosa pink and moist, septum midline; Throat/Mouth: Teeth present, mucosa pink and moist, no exudate, lesions or ulcerations noted.  Cardiovascular: Normal rate and rhythm. S1,S2 noted.  No murmur, rubs or gallops noted. No JVD or BLE edema. No carotid bruits noted. Pulmonary/Chest: Normal effort and positive vesicular breath sounds. No respiratory distress. No wheezes, rales or ronchi noted.  Neurological: Alert and oriented. Cranial nerves II-XII intact. Coordination normal. +DTRs bilaterally.      Assessment & Plan:   Type 2 Diabetes Melllitus, new onset with additional workup required:  Obtained HgbA1C- 9.7 Obtained some other basic labs Urinalysis to r/o DKA eRx for Metformin 500 mg BID Check sugars daily Referral placed for diabetes education Greater than 50% of time spent counseling patient on diabetes, time spent with pt approx 45 mins  RTC in 2 weeks for follow up  Spoke with Dr. Hyman Hopes about which treatment he prefers for patient. Will D/c  Metformin secondary to CKD and start patient on Januvia 50 mg daily

## 2012-07-07 NOTE — Patient Instructions (Addendum)
Type 2 Diabetes Diabetes is a long-lasting (chronic) disease. One or both of the following happen with type 2 diabetes:   The pancreas does not make enough of a hormone called insulin.  The body has trouble using the insulin that is made. HOME CARE  Check your blood sugar (glucose) once a day, or as told by your doctor.  Take all medicine as told by your doctor.  Do not smoke.  Eat healthy foods. Weight loss can help your diabetes.  Learn about low blood sugar (hypoglycemia). Know how to treat it.  Get your eyes checked on a regular basis.  Get a physical exam every year. Get your blood pressure checked. Get your blood and pee (urine) tested.  Wear a necklace or bracelet that says you have diabetes.  Check your feet every night for cuts, sores, blisters, and redness. Tell your doctor if you have problems. GET HELP RIGHT AWAY IF:  You have trouble keeping your blood sugar in target range.  You have problems with your medicines.  You are sick and not getting better after 24 hours.  You have a sore or wound that is not healing.  You have vision problems or changes.  You have a fever. MAKE SURE YOU:  Understand these instructions.  Will watch your condition.  Will get help right away if you are not doing well or get worse. Document Released: 02/12/2008 Document Revised: 07/28/2011 Document Reviewed: 10/21/2010 [facility] Patient Information 2013 [place], [place]. Diabetes, Frequently Asked Questions WHAT IS DIABETES? Most of the food we eat is turned into glucose (sugar). Our bodies use it for energy. The pancreas makes a hormone called insulin. It helps glucose get into the cells of our bodies. When you have diabetes, your body either does not make enough insulin or cannot use its own insulin as well as it should. This causes sugars to build up in your blood. WHAT ARE THE SYMPTOMS OF DIABETES?  Frequent urination.  Excessive thirst.  Unexplained weight  loss.  Extreme hunger.  Blurred vision.  Tingling or numbness in hands or feet.  Feeling very tired much of the time.  Dry, itchy skin.  Sores that are slow to heal.  Yeast infections. WHAT ARE THE TYPES OF DIABETES? Type 1 Diabetes   About 10% of affected people have this type.  Usually occurs before the age of 67.  Usually occurs in thin to normal weight people. Type 2 Diabetes  About 90% of affected people have this type.  Usually occurs after the age of 75.  Usually occurs in overweight people.  More likely to have:  A family history of diabetes.  A history of diabetes during pregnancy (gestational diabetes).  High blood pressure.  High cholesterol and triglycerides. Gestational Diabetes  Occurs in about 4% of pregnancies.  Usually goes away after the baby is born.  More likely to occur in women with:  Family history of diabetes.  Previous gestational diabetes.  Obese.  Over 80 years old. WHAT IS PRE-DIABETES? Pre-diabetes means your blood glucose is higher than normal, but lower than the diabetes range. It also means you are at risk of getting type 2 diabetes and heart disease. If you are told you have pre-diabetes, have your blood glucose checked again in 1 to 2 years. WHAT IS THE TREATMENT FOR DIABETES? Treatment is aimed at keeping blood glucose near normal levels at all times. Learning how to manage this yourself is important in treating diabetes. Depending on the type of diabetes you  have, your treatment will include one or more of the following:  Monitoring your blood glucose.  Meal planning.  Exercise.  Oral medicine (pills) or insulin. CAN DIABETES BE PREVENTED? With type 1 diabetes, prevention is more difficult, because the triggers that cause it are not yet known. With type 2 diabetes, prevention is more likely, with lifestyle changes:  Maintain a healthy weight.  Eat healthy.  Exercise. IS THERE A CURE FOR DIABETES? No,  there is no cure for diabetes. There is a lot of research going on that is looking for a cure, and progress is being made. Diabetes can be treated and controlled. People with diabetes can manage their diabetes and lead normal, active lives. SHOULD I BE TESTED FOR DIABETES? If you are at least 53 years old, you should be tested for diabetes. You should be tested again every 3 years. If you are 45 or older and overweight, you may want to get tested more often. If you are younger than 45, overweight, and have one or more of the following risk factors, you should be tested:  Family history of diabetes.  Inactive lifestyle.  High blood pressure. WHAT ARE SOME OTHER SOURCES FOR INFORMATION ON DIABETES? The following organizations may help in your search for more information on diabetes: National Diabetes Education Program (NDEP) Internet: SolarDiscussions.es American Diabetes Association Internet: http://www.diabetes.org  Juvenile Diabetes Foundation International Internet: WetlessWash[employer] Document Released: 05/08/2003 Document Revised: 07/28/2011 Document Reviewed: 03/02/2009 [facility] Patient Information 2013 [place], [place]. Diets for Diabetes, Food Labeling Look at food labels to help you decide how much of a product you can eat. You will want to check the amount of total carbohydrate in a serving to see how the food fits into your meal plan. In the list of ingredients, the ingredient present in the largest amount by weight must be listed first, followed by the other ingredients in descending order. STANDARD OF IDENTITY Most products have a list of ingredients. However, foods that the Food and Drug Administration (FDA) has given a standard of identity do not need a list of ingredients. A standard of identity means that a food must contain certain ingredients if it is called a particular name. Examples are mayonnaise, peanut butter, ketchup, jelly, and cheese. LABELING  TERMS There are many terms found on food labels. Some of these terms have specific definitions. Some terms are regulated by the FDA, and the FDA has clearly specified how they can be used. Others are not regulated or well-defined and can be misleading and confusing. SPECIFICALLY DEFINED TERMS Nutritive Sweetener.  A sweetener that contains calories,such as table sugar or honey. Nonnutritive Sweetener.  A sweetener with few or no calories,such as saccharin, aspartame, sucralose, and cyclamate. LABELING TERMS REGULATED BY THE FDA Free.  The product contains only a tiny or small amount of fat, cholesterol, sodium, sugar, or calories. For example, a "fat-free" product will contain less than 0.5 g of fat per serving. Low.  A food described as "low" in fat, saturated fat, cholesterol, sodium, or calories could be eaten fairly often without exceeding dietary guidelines. For example, "low in fat" means no more than 3 g of fat per serving. Lean.  "Lean" and "extra lean" are U.S. Department of Agriculture Architect) terms for use on meat and poultry products. "Lean" means the product contains less than 10 g of fat, 4 g of saturated fat, and 95 mg of cholesterol per serving. "Lean" is not as low in fat as a product labeled "low." Extra Lean.  "  Extra lean" means the product contains less than 5 g of fat, 2 g of saturated fat, and 95 mg of cholesterol per serving. While "extra lean" has less fat than "lean," it is still higher in fat than a product labeled "low." Reduced, Less, Fewer.  A diet product that contains 25% less of a nutrient or calories than the regular version. For example, hot dogs might be labeled "25% less fat than our regular hot dogs." Light/Lite.  A diet product that contains  fewer calories or  the fat of the original. For example, "light in sodium" means a product with  the usual sodium. More.  One serving contains at least 10% more of the daily value of a vitamin, mineral, or  fiber than usual. Good Source Of.  One serving contains 10% to 19% of the daily value for a particular vitamin, mineral, or fiber. Excellent Source Of.  One serving contains 20% or more of the daily value for a particular nutrient. Other terms used might be "high in" or "rich in." Enriched or Fortified.  The product contains added vitamins, minerals, or protein. Nutrition labeling must be used on enriched or fortified foods. Imitation.  The product has been altered so that it is lower in protein, vitamins, or minerals than the usual food,such as imitation peanut butter. Total Fat.  The number listed is the total of all fat found in a serving of the product. Under total fat, food labels must list saturated fat and trans fat, which are associated with raising bad cholesterol and an increased risk of heart blood vessel disease. Saturated Fat.  Mainly fats from animal-based sources. Some examples are red meat, cheese, cream, whole milk, and coconut oil. Trans Fat.  Found in some fried snack foods, packaged foods, and fried restaurant foods. It is recommended you eat as close to 0 g of trans fat as possible, since it raises bad cholesterol and lowers good cholesterol. Polyunsaturated and Monounsaturated Fats.  More healthful fats. These fats are from plant sources. Total Carbohydrate.  The number of carbohydrate grams in a serving of the product. Under total carbohydrate are listed the other carbohydrate sources, such as dietary fiber and sugars. Dietary Fiber.  A carbohydrate from plant sources. Sugars.  Sugars listed on the label contain all naturally occurring sugars as well as added sugars. LABELING TERMS NOT REGULATED BY THE FDA Sugarless.  Table sugar (sucrose) has not been added. However, the manufacturer may use another form of sugar in place of sucrose to sweeten the product. For example, sugar alcohols are used to sweeten foods. Sugar alcohols are a form of sugar but are not  table sugar. If a product contains sugar alcohols in place of sucrose, it can still be labeled "sugarless." Low Salt, Salt-Free, Unsalted, No Salt, No Salt Added, Without Added Salt.  Food that is usually processed with salt has been made without salt. However, the food may contain sodium-containing additives, such as preservatives, leavening agents, or flavorings. Natural.  This term has no legal meaning. Organic.  Foods that are certified as organic have been inspected and approved by the USDA to ensure they are produced without pesticides, fertilizers containing synthetic ingredients, bioengineering, or ionizing radiation. Document Released: 05/08/2003 Document Revised: 07/28/2011 Document Reviewed: 11/23/2008 [facility] Patient Information 2013 [place], [place]. Diabetes Meal Planning Guide The diabetes meal planning guide is a tool to help you plan your meals and snacks. It is important for people with diabetes to manage their blood glucose (sugar) levels. Choosing the  right foods and the right amounts throughout your day will help control your blood glucose. Eating right can even help you improve your blood pressure and reach or maintain a healthy weight. CARBOHYDRATE COUNTING MADE EASY When you eat carbohydrates, they turn to sugar. This raises your blood glucose level. Counting carbohydrates can help you control this level so you feel better. When you plan your meals by counting carbohydrates, you can have more flexibility in what you eat and balance your medicine with your food intake. Carbohydrate counting simply means adding up the total amount of carbohydrate grams in your meals and snacks. Try to eat about the same amount at each meal. Foods with carbohydrates are listed below. Each portion below is 1 carbohydrate serving or 15 grams of carbohydrates. Ask your dietician how many grams of carbohydrates you should eat at each meal or snack. Grains and Starches  1 slice bread.   English  muffin or hotdog/hamburger bun.   cup cold cereal (unsweetened).   cup cooked pasta or rice.   cup starchy vegetables (corn, potatoes, peas, beans, winter squash).  1 tortilla (6 inches).   bagel.  1 waffle or pancake (size of a CD).   cup cooked cereal.  4 to 6 small crackers. *Whole grain is recommended. Fruit  1 cup fresh unsweetened berries, melon, papaya, pineapple.  1 small fresh fruit.   banana or mango.   cup fruit juice (4 oz unsweetened).   cup canned fruit in natural juice or water.  2 tbs dried fruit.  12 to 15 grapes or cherries. Milk and Yogurt  1 cup fat-free or 1% milk.  1 cup soy milk.  6 oz light yogurt with sugar-free sweetener.  6 oz low-fat soy yogurt.  6 oz plain yogurt. Vegetables  1 cup raw or  cup cooked is counted as 0 carbohydrates or a "free" food.  If you eat 3 or more servings at 1 meal, count them as 1 carbohydrate serving. Other Carbohydrates   oz chips or pretzels.   cup ice cream or frozen yogurt.   cup sherbet or sorbet.  2 inch square cake, no frosting.  1 tbs honey, sugar, jam, jelly, or syrup.  2 small cookies.  3 squares of graham crackers.  3 cups popcorn.  6 crackers.  1 cup broth-based soup.  Count 1 cup casserole or other mixed foods as 2 carbohydrate servings.  Foods with less than 20 calories in a serving may be counted as 0 carbohydrates or a "free" food. You may want to purchase a book or computer software that lists the carbohydrate gram counts of different foods. In addition, the nutrition facts panel on the labels of the foods you eat are a good source of this information. The label will tell you how big the serving size is and the total number of carbohydrate grams you will be eating per serving. Divide this number by 15 to obtain the number of carbohydrate servings in a portion. Remember, 1 carbohydrate serving equals 15 grams of carbohydrate. SERVING SIZES Measuring foods and  serving sizes helps you make sure you are getting the right amount of food. The list below tells how big or small some common serving sizes are.  1 oz.........4 stacked dice.  3 oz........Marland KitchenDeck of cards.  1 tsp.......Marland KitchenTip of little finger.  1 tbs......Marland KitchenMarland KitchenThumb.  2 tbs.......Marland KitchenGolf ball.   cup......Marland KitchenHalf of a fist.  1 cup.......Marland KitchenA fist. SAMPLE DIABETES MEAL PLAN Below is a sample meal plan that includes foods from the  grain and starches, dairy, vegetable, fruit, and meat groups. A dietician can individualize a meal plan to fit your calorie needs and tell you the number of servings needed from each food group. However, controlling the total amount of carbohydrates in your meal or snack is more important than making sure you include all of the food groups at every meal. You may interchange carbohydrate containing foods (dairy, starches, and fruits). The meal plan below is an example of a 2000 calorie diet using carbohydrate counting. This meal plan has 17 carbohydrate servings. Breakfast  1 cup oatmeal (2 carb servings).   cup light yogurt (1 carb serving).  1 cup blueberries (1 carb serving).   cup almonds. Snack  1 large apple (2 carb servings).  1 low-fat string cheese stick. Lunch  Chicken breast salad.  1 cup spinach.   cup chopped tomatoes.  2 oz chicken breast, sliced.  2 tbs low-fat [place] dressing.  12 whole-wheat crackers (2 carb servings).  12 to 15 grapes (1 carb serving).  1 cup low-fat milk (1 carb serving). Snack  1 cup carrots.   cup hummus (1 carb serving). Dinner  3 oz broiled salmon.  1 cup brown rice (3 carb servings). Snack  1  cups steamed broccoli (1 carb serving) drizzled with 1 tsp olive oil and lemon juice.  1 cup light pudding (2 carb servings). DIABETES MEAL PLANNING WORKSHEET Your dietician can use this worksheet to help you decide how many servings of foods and what types of foods are right for you.  BREAKFAST Food  Group and Servings / Carb Servings Grain/Starches __________________________________ Dairy __________________________________________ Vegetable ______________________________________ Fruit ___________________________________________ Meat __________________________________________ Fat ____________________________________________ LUNCH Food Group and Servings / Carb Servings Grain/Starches ___________________________________ Dairy ___________________________________________ Fruit ____________________________________________ Meat ___________________________________________ Fat _____________________________________________ Laural Golden Food Group and Servings / Carb Servings Grain/Starches ___________________________________ Dairy ___________________________________________ Fruit ____________________________________________ Meat ___________________________________________ Fat _____________________________________________ SNACKS Food Group and Servings / Carb Servings Grain/Starches ___________________________________ Dairy ___________________________________________ Vegetable _______________________________________ Fruit ____________________________________________ Meat ___________________________________________ Fat _____________________________________________ DAILY TOTALS Starches _________________________ Vegetable ________________________ Fruit ____________________________ Dairy ____________________________ Meat ____________________________ Fat ______________________________ Document Released: 01/30/2005 Document Revised: 07/28/2011 Document Reviewed: 12/11/2008 [employer] Patient Information 2013 [place], [place]. Diabetes and Standards of Medical Care  Diabetes is complicated. You may find that your diabetes team includes a dietitian, nurse, diabetes educator, eye doctor, and more. To help everyone know what is going on and to help you get the care you deserve, the following schedule  of care was developed to help keep you on track. Below are the tests, exams, vaccines, medicines, education, and plans you will need. A1c test  Performed at least 2 times a year if you are meeting treatment goals.  Performed 4 times a year if therapy has changed or if you are not meeting therapy/glycemic goals. Aspirin medicine  Take daily as directed by your caregiver. Blood pressure test  Performed at every routine medical visit. The goal is less than 130/80 mm/Hg. Dental exam  Get a dental exam at least 2 times a year. Dilated eye exam (retinal exam)  Type 1 diabetes: Get an exam within 5 years of diagnosis and then yearly.  Type 2 diabetes: Get an exam at diagnosis and then yearly. All exams thereafter can be extended to every 2 to 3 years if one or more exams have been normal. Foot care exam  Visual foot exams are performed at every routine medical visit. The exams check for cuts, injuries, or other problems with the feet.  A comprehensive foot exam should  be done yearly. This includes visual inspection as well as assessing foot pulses and testing for loss of sensation. Kidney function test (urine microalbumin)  Performed once a year.  Type 1 diabetes: The first test is performed 5 years after diagnosis.  Type 2 diabetes: The first test is performed at the time of diagnosis.  A serum creatinine and estimated glomerular filtration rate (eGFR) test is done once a year to tell the level of chronic kidney disease (CKD), if present. Lipid profile (Cholesterol, HDL, LDL, Triglycerides)  Performed once a year for most people. If at low risk, may be assessed every 2 years.  The goal for LDL is less than 100 mg/dl. If at high risk, the goal is less than 70 mg/dl.  The goal for HDL is higher than 40 mg/dl for men and higher than 50 mg/dl for women.  The goal for triglycerides is less than 150 mg/dl. Flu vaccine, pneumonia vaccine, and hepatitis B vaccine  The flu vaccine is  recommended yearly.  The pneumonia vaccine is generally given once in a lifetime. However, there are some instances where another vaccine is recommended. Check with your caregiver.  The hepatitis B vaccine is also recommended for adults with diabetes. Diabetes self-management education  Recommended at diagnosis and ongoing as needed. Treatment plan  Reviewed at every medical visit. Document Released: 03/02/2009 Document Revised: 07/28/2011 Document Reviewed: 11/05/2010 [facility] Patient Information 2013 [place], [place].

## 2012-07-08 ENCOUNTER — Telehealth: Payer: Self-pay | Admitting: *Deleted

## 2012-07-08 MED ORDER — SITAGLIPTIN PHOSPHATE 50 MG PO TABS: 50.0000 mg | ORAL_TABLET | Freq: Every day | ORAL | Status: DC

## 2012-07-08 NOTE — Addendum Note (Signed)
Addended by: Lorre Munroe on: 07/08/2012 09:41 AM   Modules accepted: Orders, Medications

## 2012-07-08 NOTE — Telephone Encounter (Signed)
Pt informed to stop Metformin and start Januvia. Pt states that he may need samples of Januvia if it is not generic but he will check with pharmacy first and let us know.

## 2012-07-08 NOTE — Telephone Encounter (Signed)
Ash,   Can you cal Anne Fu from [place]. I spoke with Dr. Hyman Hopes his nephrologist about his new onset diabetes. He would prefer that the patient not be on Metformin so I switched him to Januvia 50 mg daily. Have him stop the metformin and start the Januvia. We will monitor his kidney function. If it worsens on this medication we will have to take him off and discuss other treatment options. I will see him in 2 weeks   Regina                  Left message for pt to callback office.

## 2012-07-12 ENCOUNTER — Encounter: Payer: Self-pay | Admitting: Internal Medicine

## 2012-07-12 ENCOUNTER — Ambulatory Visit (INDEPENDENT_AMBULATORY_CARE_PROVIDER_SITE_OTHER): Payer: Managed Care, Other (non HMO) | Admitting: Internal Medicine

## 2012-07-12 VITALS — BP 108/74 | HR 76 | Temp 98.3°F | Ht 66.0 in | Wt 210.0 lb

## 2012-07-12 LAB — POCT URINALYSIS DIPSTICK
Blood, UA: POSITIVE
Glucose, UA: POSITIVE
Leukocytes, UA: NEGATIVE
Nitrite, UA: NEGATIVE
Urobilinogen, UA: 0.2

## 2012-07-12 MED ORDER — HYDROCODONE-HOMATROPINE 5-1.5 MG/5ML PO SYRP: 5.0000 mL | ORAL_SOLUTION | Freq: Three times a day (TID) | ORAL | Status: DC | PRN

## 2012-07-12 MED ORDER — INSULIN DETEMIR 100 UNIT/ML ~~LOC~~ SOLN: 20.0000 [IU] | Freq: Every day | SUBCUTANEOUS | Status: DC

## 2012-07-12 MED ORDER — AZITHROMYCIN 250 MG PO TABS: ORAL_TABLET | ORAL | Status: DC

## 2012-07-12 NOTE — Patient Instructions (Signed)
Insulin Detemir injection What is this medicine? INSULIN DETEMIR (IN su lin DE te mir) is a human-made form of insulin. This drug lowers the amount of sugar in your blood. It is a long-acting insulin that is usually given once or twice a day. This medicine may be used for other purposes; ask your health care provider or pharmacist if you have questions. What should I tell my health care provider before I take this medicine? They need to know if you have any of these conditions: -episodes of hypoglycemia -kidney disease -liver disease -an unusual or allergic reaction to insulin, metacresol, other medicines, foods, dyes, or preservatives -pregnant or trying to get pregnant -breast-feeding How should I use this medicine? This medicine is for injection under the skin. Use exactly as directed. It is important to follow the directions given to you by your health care professional or doctor. Your doctor or health care professional will teach you how to give yourself injections. If you utilize an insulin injector device, you will be taught how to use it, prime it, and how to refill the device with the insulin cartridges. You will be taught how to adjust doses for activities and illness. Do not use more insulin than prescribed. Do not use more or less often than prescribed. Always check the appearance of your insulin before using it. This medicine should be clear and colorless like water. Do not use if it is cloudy, thickened, colored, or has solid particles in it. It is important that you put your used needles and syringes in a special sharps container. Do not put them in a trash can. If you do not have a sharps container, call your pharmacist or healthcare provider to get one. Talk to your pediatrician regarding the use of this medicine in children. While this drug may be prescribed for children as young as 2 years for selected conditions, precautions do apply. Overdosage: If you think you have taken too  much of this medicine contact a poison control center or emergency room at once. NOTE: This medicine is only for you. Do not share this medicine with others. What if I miss a dose? It is important not to miss a dose. Your health care professional or doctor should discuss a plan for missed doses with you. If you do miss a dose, follow their plan. Do not take double doses. What may interact with this medicine? -other medicines for diabetes Many medications may cause an increase or decrease in blood sugar, these include: -alcohol containing beverages -aspirin and aspirin-like drugs -chloramphenicol -chromium -diuretics -male hormones, like estrogens or progestins and birth control pills -heart medicines -isoniazid -MAOIs like Carbex, Eldepryl, Marplan, Nardil, and Parnate -male hormones or anabolic steroids -medicines for weight loss -medicines for allergies, asthma, cold, or cough -medicines for mental problems -niacin -NSAIDs, medicines for pain and inflammation, like ibuprofen or naproxen -pentamidine -phenytoin -probenecid -quinolone antibiotics like ciprofloxacin, levofloxacin, ofloxacin -some herbal dietary supplements -steroid medicines like prednisone or cortisone -thyroid medicine Some medications can hide the warning symptoms of low blood sugar. You may need to monitor your blood sugar more closely if you are taking one of these medications. These include: -beta-blockers such as atenolol, metoprolol, propranolol -clonidine -guanethidine -reserpine This list may not describe all possible interactions. Give your health care provider a list of all the medicines, herbs, non-prescription drugs, or dietary supplements you use. Also tell them if you smoke, drink alcohol, or use illegal drugs. Some items may interact with your medicine. What should  I watch for while using this medicine? Visit your health care professional or doctor for regular checks on your progress. To control  your diabetes you must use this medicine regularly and follow a diet and exercise schedule. Checking and recording your blood sugar and urine ketone levels regularly is important. Use a blood sugar measuring device before you treat high or low blood sugar. Always carry a quick-source of sugar with you in case you have symptoms of low blood sugar. Examples include hard sugar candy or glucose tablets. Make sure family members know that you can choke if you eat or drink when you develop serious symptoms of low blood sugar, such as seizures or unconsciousness. They must get medical help at once. Make sure that you have the right kind of syringe for the type of insulin you use. Try not to change the brand and type of insulin or syringe unless your health care professional or doctor tells you to. Switching insulin brand or type can cause dangerously high or low blood sugar. Always keep an extra supply of insulin, syringes, and needles on hand. Use a syringe one time only. Throw away syringe and needle in a closed container to prevent accidental needle sticks. Insulin pens and cartridges should never be shared. Sharing may result in passing of viruses like hepatitis or HIV. Wear a medical identification bracelet or chain to say you have diabetes, and carry a card that lists all your medications. Many nonprescription cough and cold products contain sugar or alcohol. These can affect diabetes control or can alter the results of tests used to monitor blood sugar. Avoid alcohol. Avoid products that contain alcohol or sugar. What side effects may I notice from receiving this medicine? Side effects that you should report to your health care professional or doctor as soon as possible: Symptoms of low blood sugar: -You may feel nervous, confused, dizzy, hungry, weak, sweaty, shaky, cold, and irritable. You may also experience headache, blurred vision, rapid heartbeat and loss of consciousness. Symptoms of high blood  sugar: -You may experience dizziness, dry mouth, dry skin, fruity breath, loss of appetite, nausea, stomach ache, unusual thirst, frequent urination Insulin also can cause rare but serious allergic reactions in some patients, including: -bad skin rash and itching -breathing problems Side effects that usually do not require medical attention (report to your health care professional or doctor if they continue or are bothersome): -increase or decrease in fatty tissue under the skin, through overuse of a particular injection location -itching, burning, swelling, or rash where injected This list may not describe all possible side effects. Call your doctor for medical advice about side effects. You may report side effects to FDA at 1-800-FDA-1088. Where should I keep my medicine? Keep out of the reach of children. Store unopened cartridges, FlexPens, or Levemir Innolet systems in a refrigerator between 2 and 8 degrees C (36 and 46 degrees F.) Do not freeze or use if the insulin has been frozen. Once opened, the Levemir Innolet system, FlexPen, and cartridges that are inserted into pens should be kept at room temperature, below 30 degrees C (86 degrees F). Do not store in the refrigerator once opened. Once opened, the insulin can be used for 42 days. After 42 days, the cartridge, Levemir Innolet system or FlexPen should be thrown away. Store unopened insulin vials in a refrigerator between 2 and 8 degrees C (36 and 46 degrees F). Do not freeze or use if the insulin has been frozen. Opened vials (vials currently  in use) should be stored in a refrigerator, never a freezer. If refrigeration is not possible, the opened vial can be stored unrefrigerated at room temperature, below 30 degrees C (86 degrees F) for up to 42 days. After 42 days, the vial of insulin should be thrown away. Keeping your insulin at room temperature decreases the amount of pain during injection. Protect from light and excessive heat. Throw  away any unused medicine after the expiration date or after the specified time for room temperature storage has passed. NOTE: This sheet is a summary. It may not cover all possible information. If you have questions about this medicine, talk to your doctor, pharmacist, or health care provider.  2013, [employer]. (10/10/2010 4:20:34 PM)

## 2012-07-12 NOTE — Assessment & Plan Note (Addendum)
Will continue Januvia at this time Will recheck RFP in 1 month Will add Levemir  20 units at night Continue to check sugars if > 300, call the office

## 2012-07-12 NOTE — Progress Notes (Signed)
Subjective:    Patient ID: Mike Orozco, male    DOB: 10/10/1961, 52 y.o.   MRN: [id number]  HPI  Pt presents to the clinic today with c/o continued hyperglycemia after being diagnosed with new onset Type 2 Diabetes Mellitus last week. He was started on Januvia 50 mg, but close monitoring needs to take place before dosa adjustment secondary to kidney dysfunction. He has been testing his sugars which are running between 400-450. He has already made changes to his diet. He has cut out sodas, been eating a lot of vegetables and been walking on a treadmill daily. He is not sure why his sugar is still uncontrolled. Additionally, he does c/o cough, sore throat and fatigue for the past 4 days. He has been taking Robitussin without much relief. He denies fever.He has no history of allergies or asthma. He has had sick contacts. Pt also c/o blood in his urine. He has noticed this over the past day. He has no pain with urination or back pain. He does have a history of kidney stones but is not in any pain at this time.  Review of Systems      Past Medical History  Diagnosis Date  . Hypertension   . Heart failure, chronic systolic   . Non-ischemic cardiomyopathy     a) Echo 03/25/2010: EF 20-25%; mild AI; Mod MR; Mild LAE  b)cardiac cath 03/26/2010: normal cors; severe pulmonary HTN;PCWP 41; EF 10-15%;  c. echo 4/12:  EF 45-50%, mild LVH, grade 1 diast dysfxn, mild AI, mild LAE  . History of ETOH abuse   . Gout, unspecified   . Pure hyperglyceridemia   . Tobacco use disorder   . Anemia   . Chronic renal insufficiency   . Personal history of colonic polyps-adenoma 02/17/2012    01/2012 - diminutive adenoma Repeat colon about 01/2017      Current Outpatient Prescriptions  Medication Sig Dispense Refill  . allopurinol (ZYLOPRIM) 300 MG tablet Take 1 tablet (300 mg total) by mouth daily.  90 tablet  3  . aspirin 81 MG EC tablet Take 81 mg by mouth daily.        . Beclomethasone Dipropionate (QNASL) 80  MCG/ACT AERS Place 2 puffs into the nose daily.  8.7 g  11  . carvedilol (COREG) 25 MG tablet TAKE ONE TABLET BY MOUTH TWICE A DAY  60 tablet  11  . ferrous sulfate 325 (65 FE) MG tablet Take 325 mg by mouth daily with breakfast.      . glucose blood (ONE TOUCH ULTRA TEST) test strip Use as instructed to check blood sugar once daily dx 250.02  100 each  3  . LIPOFEN 150 MG CAPS TAKE 1 CAPSULE (150 MG TOTAL) BY MOUTH DAILY.  30 capsule  5  . lisinopril (PRINIVIL,ZESTRIL) 40 MG tablet TAKE 1 TABLET BY MOUTH DAILY  90 tablet  3  . methocarbamol (ROBAXIN) 500 MG tablet Take 1 tablet (500 mg total) by mouth 4 (four) times daily.  75 tablet  1  . Multiple Vitamin (MULTIVITAMIN) capsule Take 1 capsule by mouth daily.      Letta Pate DELICA LANCETS 33G MISC 1 each by Does not apply route daily. Dx 250.02  100 each  3  . pravastatin (PRAVACHOL) 40 MG tablet TAKE 1 TABLET BY MOUTH EVERY EVENING  30 tablet  11  . sitaGLIPtin (JANUVIA) 50 MG tablet Take 1 tablet (50 mg total) by mouth daily.  30 tablet  0  .  spironolactone (ALDACTONE) 25 MG tablet TAKE 1 TABLET BY MOUTH EVERY DAY  30 tablet  6  . azithromycin (ZITHROMAX) 250 MG tablet Take 2 tablets, then 1 tablet daily for 4 days  6 tablet  0  . HYDROcodone-homatropine (HYCODAN) 5-1.5 MG/5ML syrup Take 5 mLs by mouth every 8 (eight) hours as needed for cough.  120 mL  0  . insulin detemir (LEVEMIR) 100 UNIT/ML injection Inject 20 Units into the skin daily.  10 mL  12   No current facility-administered medications for this visit.    No Known Allergies  Family History  Problem Relation Age of Onset  . Diabetes Mother   . Diabetes Father   . Hypertension Mother   . Hypertension Father   . Colon cancer Neg Hx     History   Social History  . Marital Status: Married    Spouse Name: N/A    Number of Children: 1  . Years of Education: N/A   Occupational History  . 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 Smoking status:  Former Smoker    Types: Cigarettes    Quit date: 02/16/2010  . Smokeless tobacco: Never Used  . Alcohol Use: No     Comment: Quit 02/2010  . Drug Use: Yes  . Sexually Active: Yes    Birth Control/ Protection: Condom   Other Topics Concern  . Not on file   Social History Narrative   No regular exercise   Daily caffeine           Constitutional: Pt reports fatigue and headache. Denies fever, malaise, or abrupt weight changes.  HEENT: Pt reports runny nose and sore throat. Denies eye pain, eye redness, ear pain, ringing in the ears, wax buildup,  nasal congestion, bloody nose. Respiratory: Pt reports cough. Denies difficulty breathing, shortness of breath, or sputum production.   Cardiovascular: Denies chest pain, chest tightness, palpitations or swelling in the hands or feet.  GU: Pt reports blood in his urine. Denies urgency, frequency, pain with urination, burning sensation,  odor or discharge.  Skin: Denies redness, rashes, lesions or ulcercations.  Neurological: Denies numbness or tingling in the hands or feet, dizziness, difficulty with memory, difficulty with speech or problems with balance and coordination.   No other specific complaints in a complete review of systems (except as listed in HPI above).   Objective:   Physical Exam    BP 108/74  Pulse 76  Temp(Src) 98.3 F (36.8 C) (Oral)  Ht 5\' 6"  (1.676 m)  Wt 210 lb (95.255 kg)  BMI 33.91 kg/m2  SpO2 97% Wt Readings from Last 3 Encounters:  07/12/12 210 lb (95.255 kg)  07/07/12 210 lb (95.255 kg)  05/10/12 217 lb (98.431 kg)    General: Appears his stated age, well developed, well nourished in NAD. Skin: Warm, dry and intact. No rashes, lesions or ulcerations noted. HEENT: Head: normal shape and size; Eyes: sclera white, no icterus, conjunctiva pink, PERRLA and EOMs intact; Ears: Tm's gray and intact, normal light reflex; Nose: mucosa pink and moist, septum midline; Throat/Mouth: Teeth present, mucosa  erythematous and moist, + PND, no exudate, lesions or ulcerations noted.  Neck: Normal range of motion. Neck supple, trachea midline. Mild cervical lymphadenopathy.  Cardiovascular: Normal rate and rhythm. S1,S2 noted.  No murmur, rubs or gallops noted. No JVD or BLE edema. No carotid bruits noted. Pulmonary/Chest: Normal effort and positive vesicular breath sounds. No respiratory distress. No  wheezes, rales or ronchi noted.  Abdomen: Soft and nontender. Normal bowel sounds, no bruits noted. No distention or masses noted. Liver, spleen and kidneys non palpable. No CVA tenderness. Neurological: Alert and oriented. Cranial nerves II-XII intact. Coordination normal. +DTRs bilaterally. Sensation intact to 10 gm Monofilament.      Assessment & Plan:   Hematuria, new onset with additional workup required:  UA neg for blood Increase fluid intake for next 2 days If you continue to see dark colored urine, call back  Upper Respiratory Infection, new onset with additional workup required:  Drink plety of fluids and get some rest eRx for Azithromax x 5 days eRx for Hycoodan cough syrup RTC in 2 weeks for f/u DM2

## 2012-07-13 ENCOUNTER — Encounter: Payer: Self-pay | Admitting: Internal Medicine

## 2012-07-13 ENCOUNTER — Telehealth: Payer: Self-pay | Admitting: *Deleted

## 2012-07-13 ENCOUNTER — Other Ambulatory Visit: Payer: Self-pay | Admitting: *Deleted

## 2012-07-13 MED ORDER — INSULIN PEN NEEDLE 32G X 4 MM MISC: Status: DC

## 2012-07-13 NOTE — Telephone Encounter (Signed)
Left msg on triage requesting call back. Called pt back no answer LMOM RTC.../lmb 

## 2012-07-20 ENCOUNTER — Ambulatory Visit (INDEPENDENT_AMBULATORY_CARE_PROVIDER_SITE_OTHER): Payer: Managed Care, Other (non HMO) | Admitting: Internal Medicine

## 2012-07-20 ENCOUNTER — Ambulatory Visit: Payer: Managed Care, Other (non HMO) | Admitting: Internal Medicine

## 2012-07-20 ENCOUNTER — Telehealth: Payer: Self-pay | Admitting: *Deleted

## 2012-07-20 ENCOUNTER — Encounter: Payer: Self-pay | Admitting: Internal Medicine

## 2012-07-20 VITALS — BP 120/72 | HR 82 | Temp 98.0°F | Ht 66.0 in | Wt 208.0 lb

## 2012-07-20 MED ORDER — INSULIN DETEMIR 100 UNIT/ML ~~LOC~~ SOLN: 26.0000 [IU] | Freq: Every day | SUBCUTANEOUS | Status: DC

## 2012-07-20 MED ORDER — INSULIN ASPART 100 UNIT/ML FLEXPEN: 5.0000 [IU] | Freq: Three times a day (TID) | SUBCUTANEOUS | Status: DC

## 2012-07-20 NOTE — Patient Instructions (Signed)
Insulin Lispro injection What is this medicine? INSULIN LISPRO (IN su lin LYE sproe) is a human-made form of insulin. This drug lowers the amount of sugar in your blood. This medicine is a rapid-acting insulin that starts working faster than regular insulin. It will not work as long as regular insulin. This medicine may be used for other purposes; ask your health care Katriana Dortch or pharmacist if you have questions. What should I tell my health care Nalaysia Manganiello before I take this medicine? They need to know if you have any of these conditions: -episodes of hypoglycemia -kidney disease -liver disease -an unusual or allergic reaction to insulin, metacresol, other medicines, foods, dyes, or preservatives -pregnant or trying to get pregnant -breast-feeding How should I use this medicine? This medicine is for injection under the skin or infusion into a vein. This medicine may be given by health care professional in a hospital or clinic setting. It is important to follow the directions given to you by your health care professional or doctor. You should inject this medicine within 15 minutes before or after your meal. You will be taught how to use this medicine and how to adjust doses for activities and illness. Do not use more insulin than prescribed. Do not use more or less often than prescribed. Always check the appearance of your insulin before using it. This medicine should be clear and colorless like water. Do not use it if it is cloudy, thickened, colored, or has solid particles in it. It is important that you put your used needles and syringes in a special sharps container. Do not put them in a trash can. If you do not have a sharps container, call your pharmacist or healthcare Aleane Wesenberg to get one. Talk to your pediatrician regarding the use of this medicine in children. Special care may be needed. Overdosage: If you think you have taken too much of this medicine contact a poison control center or  emergency room at once. NOTE: This medicine is only for you. Do not share this medicine with others. What if I miss a dose? It is important not to miss a dose. Your health care professional or doctor should discuss a plan for missed doses with you. If you do miss a dose, follow their plan. Do not take double doses. What may interact with this medicine? -other medicines for diabetes Many medications may cause an increase or decrease in blood sugar, these include: -alcohol containing beverages -aspirin and aspirin-like drugs -chloramphenicol -chromium -diuretics -male hormones, like estrogens or progestins and birth control pills -heart medicines -isoniazid -male hormones or anabolic steroids -medicines for weight loss -medicines for allergies, asthma, cold, or cough -medicines for mental problems -medicines called MAO Inhibitors like Nardil, Parnate, Marplan, Eldepryl -niacin -NSAIDs, medicines for pain and inflammation, like ibuprofen or naproxen -pentamidine -phenytoin -probenecid -quinolone antibiotics like ciprofloxacin, levofloxacin, ofloxacin -some herbal dietary supplements -steroid medicines like prednisone or cortisone -thyroid medicine Some medications can hide the warning symptoms of low blood sugar. You may need to monitor your blood sugar more closely if you are taking one of these medications. These include: -beta-blockers such as atenolol, metoprolol, propranolol -clonidine -guanethidine -reserpine This list may not describe all possible interactions. Give your health care Pami Wool a list of all the medicines, herbs, non-prescription drugs, or dietary supplements you use. Also tell them if you smoke, drink alcohol, or use illegal drugs. Some items may interact with your medicine. What should I watch for while using this medicine? Visit your health care  professional or doctor for regular checks on your progress. To control your diabetes you must use this medicine  regularly and follow a diet and exercise schedule. Checking and recording your blood sugar and urine ketone levels regularly is important. Use a blood sugar measuring device before you treat high or low blood sugar. Always carry a quick-source of sugar with you in case you have symptoms of low blood sugar. Examples include hard sugar candy or glucose tablets. Make sure family members know that you can choke if you eat or drink when you develop serious symptoms of low blood sugar, such as seizures or unconsciousness. They must get medical help at once. Make sure that you have the right kind of syringe for the type of insulin you use. Try not to change the brand and type of insulin or syringe unless your health care professional or doctor tells you to. Switching insulin brand or type can cause dangerously high or low blood sugar. Always keep an extra supply of insulin, syringes, and needles on hand. Use a syringe one time only. Throw away syringe and needle in a closed container to prevent accidental needle sticks. Insulin pens and cartridges should never be shared. Sharing may result in passing of viruses like hepatitis or HIV. Wear a medical identification bracelet or chain to say you have diabetes, and carry a card that lists all your medications. Many nonprescription cough and cold products contain sugar or alcohol. These can affect diabetes control or can alter the results of tests used to monitor blood sugar. Avoid alcohol. Avoid products that contain alcohol or sugar. What side effects may I notice from receiving this medicine? Side effects that you should report to your health care professional or doctor as soon as possible: Symptoms of low blood sugar: -You may feel nervous, confused, dizzy, hungry, weak, sweaty, shaky, cold, and irritable. You may also experience headache, blurred vision, rapid heartbeat and loss of consciousness. Symptoms of high blood sugar: -You may experience dizziness, dry  mouth, dry skin, fruity breath, loss of appetite, nausea, stomach ache, unusual thirst, frequent urination Insulin also can cause rare but serious allergic reactions in some patients, including: -bad skin rash and itching -breathing problems Side effects that usually do not require medical attention (report to your health care professional or doctor if they continue or are bothersome): -increase or decrease in fatty tissue under the skin, through overuse of a particular injection -itching, burning, swelling, or rash at the injection site This list may not describe all possible side effects. Call your doctor for medical advice about side effects. You may report side effects to FDA at 1-800-FDA-1088. Where should I keep my medicine? Keep out of the reach of children. Store unopened insulin vials in a refrigerator between 2 and 8 degrees C (36 and 46 degrees F). Do not freeze or use if the insulin has been frozen. Opened vials (vials currently in use) may be stored in the refrigerator or at room temperature, at approximately 30 degrees C (86 degrees F) or cooler. Keeping your insulin at room temperature decreases the amount of pain during injection. Once opened, your insulin can be used for 28 days. After 28 days, the vial of insulin should be thrown away. Store unopened cartridges or disposable pens in a refrigerator between 2 and 8 degrees C (36 and 46 degrees F.) Do not freeze or use if the insulin has been frozen. Once opened, the disposable pens and cartridges that are inserted into pens should be kept  at room temperature, approximately 30 degrees C (80 degrees F) or cooler. Do not store in the refrigerator. Once opened, the insulin can be used for 28 days. After 28 days, the cartridge or disposable pen should be thrown away. Protect from light and excessive heat. Throw away any unused medicine after the expiration date or after the specified time for room temperature storage has passed. NOTE: This  sheet is a summary. It may not cover all possible information. If you have questions about this medicine, talk to your doctor, pharmacist, or health care Darell Saputo.  2013, [employer]. (03/04/2011 2:18:04 PM)

## 2012-07-20 NOTE — Assessment & Plan Note (Signed)
Continua januvia Increase Levemir to 26 units QHS Will add Novolog 5 units before each meal Referral placed to endocrinology Call me tomorrow and let me know what your sugars are running 2 hours after you eat

## 2012-07-20 NOTE — Telephone Encounter (Signed)
Pt wants to know if he should continue taking Januvia 50mg  along with insulin.

## 2012-07-20 NOTE — Progress Notes (Deleted)
HPI  Pt presents to the clinic today with c/o cold symptoms x 2 weeks. He was seen 8 days ago for the same. He was given a z pack for an URI and Hycodan cough syrup at night. Since that time,  Review of Systems      Past Medical History  Diagnosis Date  . Hypertension   . Heart failure, chronic systolic   . Non-ischemic cardiomyopathy     a) Echo 03/25/2010: EF 20-25%; mild AI; Mod MR; Mild LAE  b)cardiac cath 03/26/2010: normal cors; severe pulmonary HTN;PCWP 41; EF 10-15%;  c. echo 4/12:  EF 45-50%, mild LVH, grade 1 diast dysfxn, mild AI, mild LAE  . History of ETOH abuse   . Gout, unspecified   . Pure hyperglyceridemia   . Tobacco use disorder   . Anemia   . Chronic renal insufficiency   . Personal history of colonic polyps-adenoma 02/17/2012    01/2012 - diminutive adenoma Repeat colon about 01/2017      Family History  Problem Relation Age of Onset  . Diabetes Mother   . Diabetes Father   . Hypertension Mother   . Hypertension Father   . Colon cancer Neg Hx     History   Social History  . Marital Status: Married    Spouse Name: N/A    Number of Children: 1  . Years of Education: N/A   Occupational History  . 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 Smoking status: Former Smoker    Types: Cigarettes    Quit date: 02/16/2010  . Smokeless tobacco: Never Used  . Alcohol Use: No     Comment: Quit 02/2010  . Drug Use: Yes  . Sexually Active: Yes    Birth Control/ Protection: Condom   Other Topics Concern  . Not on file   Social History Narrative   No regular exercise   Daily caffeine          No Known Allergies   Constitutional: Positive headache, fatigue and fever. Denies abrupt weight changes.  HEENT:  Positive sore throat. Denies eye redness, eye pain, pressure behind the eyes, facial pain, nasal congestion, ear pain, ringing in the ears, wax buildup, runny nose or bloody nose. Respiratory: Positive cough. Denies difficulty  breathing or shortness of breath.  Cardiovascular: Denies chest pain, chest tightness, palpitations or swelling in the hands or feet.   No other specific complaints in a complete review of systems (except as listed in HPI above).  Objective:   There were no vitals taken for this visit. Wt Readings from Last 3 Encounters:  07/12/12 210 lb (95.255 kg)  07/07/12 210 lb (95.255 kg)  05/10/12 217 lb (98.431 kg)     General: Appears his stated age, well developed, well nourished in NAD. HEENT: Head: normal shape and size; Eyes: sclera white, no icterus, conjunctiva pink, PERRLA and EOMs intact; Ears: Tm's gray and intact, normal light reflex; Nose: mucosa pink and moist, septum midline; Throat/Mouth: + PND. Teeth present, mucosa erythematous and moist, no exudate noted, no lesions or ulcerations noted.  Neck: Mild cervical lymphadenopathy. Neck supple, trachea midline. No massses, lumps or thyromegaly present.  Cardiovascular: Normal rate and rhythm. S1,S2 noted.  No murmur, rubs or gallops noted. No JVD or BLE edema. No carotid bruits noted. Pulmonary/Chest: Normal effort and positive vesicular breath sounds. No respiratory distress. No wheezes, rales or ronchi noted.      Assessment & Plan:  Upper Respiratory Infection, new onset with additional workup required:  Get some rest and drink plenty of water Do salt water gargles for the sore throat eRx for Azithromax x 5 days eRx for Hycodan cough syrup  RTC as needed or if symptoms persist.

## 2012-07-20 NOTE — Progress Notes (Signed)
Subjective:    Patient ID: Mike Orozco, male    DOB: 10/10/1961, 52 y.o.   MRN: [id number]  HPI  Pt presents to the clinic today to f/u elevated blood sugars. His Levemir was increase to 22 units at his last visit. His sugars are still running 320-400. He has made diet changes and has been exercising. He is not sure why his blood sugars are still this elevated. He has had no problems with the Levemir. He is checking his blood sugars 3 x day.  Review of Systems      Past Medical History  Diagnosis Date  . Hypertension   . Heart failure, chronic systolic   . Non-ischemic cardiomyopathy     a) Echo 03/25/2010: EF 20-25%; mild AI; Mod MR; Mild LAE  b)cardiac cath 03/26/2010: normal cors; severe pulmonary HTN;PCWP 41; EF 10-15%;  c. echo 4/12:  EF 45-50%, mild LVH, grade 1 diast dysfxn, mild AI, mild LAE  . History of ETOH abuse   . Gout, unspecified   . Pure hyperglyceridemia   . Tobacco use disorder   . Anemia   . Chronic renal insufficiency   . Personal history of colonic polyps-adenoma 02/17/2012    01/2012 - diminutive adenoma Repeat colon about 01/2017      Current Outpatient Prescriptions  Medication Sig Dispense Refill  . allopurinol (ZYLOPRIM) 300 MG tablet Take 1 tablet (300 mg total) by mouth daily.  90 tablet  3  . aspirin 81 MG EC tablet Take 81 mg by mouth daily.        . Beclomethasone Dipropionate (QNASL) 80 MCG/ACT AERS Place 2 puffs into the nose daily.  8.7 g  11  . carvedilol (COREG) 25 MG tablet TAKE ONE TABLET BY MOUTH TWICE A DAY  60 tablet  11  . ferrous sulfate 325 (65 FE) MG tablet Take 325 mg by mouth daily with breakfast.      . glucose blood (ONE TOUCH ULTRA TEST) test strip Use as instructed to check blood sugar once daily dx 250.02  100 each  3  . insulin detemir (LEVEMIR) 100 UNIT/ML injection Inject 26 Units into the skin daily.  10 mL  12  . Insulin Pen Needle 32G X 4 MM MISC Use as directed to inject insulin once daily dx 250.60  100 each  3  . LIPOFEN  150 MG CAPS TAKE 1 CAPSULE (150 MG TOTAL) BY MOUTH DAILY.  30 capsule  5  . lisinopril (PRINIVIL,ZESTRIL) 40 MG tablet TAKE 1 TABLET BY MOUTH DAILY  90 tablet  3  . methocarbamol (ROBAXIN) 500 MG tablet Take 1 tablet (500 mg total) by mouth 4 (four) times daily.  75 tablet  1  . Multiple Vitamin (MULTIVITAMIN) capsule Take 1 capsule by mouth daily.      Letta Pate DELICA LANCETS 33G MISC 1 each by Does not apply route daily. Dx 250.02  100 each  3  . pravastatin (PRAVACHOL) 40 MG tablet TAKE 1 TABLET BY MOUTH EVERY EVENING  30 tablet  11  . sitaGLIPtin (JANUVIA) 50 MG tablet Take 1 tablet (50 mg total) by mouth daily.  30 tablet  0  . insulin aspart 100 unit/ml SOLN Inject 5 Units into the skin 3 (three) times daily before meals.  3 pen  1  . spironolactone (ALDACTONE) 25 MG tablet TAKE 1 TABLET BY MOUTH EVERY DAY  30 tablet  6   No current facility-administered medications for this visit.    No Known Allergies  Family History  Problem Relation Age of Onset  . Diabetes Mother   . Diabetes Father   . Hypertension Mother   . Hypertension Father   . Colon cancer Neg Hx     History   Social History  . Marital Status: Married    Spouse Name: N/A    Number of Children: 1  . Years of Education: N/A   Occupational History  . 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 Smoking status: Former Smoker    Types: Cigarettes    Quit date: 02/16/2010  . Smokeless tobacco: Never Used  . Alcohol Use: No     Comment: Quit 02/2010  . Drug Use: Yes  . Sexually Active: Yes    Birth Control/ Protection: Condom   Other Topics Concern  . Not on file   Social History Narrative   No regular exercise   Daily caffeine           Constitutional: Denies fever, malaise, fatigue, headache or abrupt weight changes.    GU: Pt reports frequency. Denies urgency, pain with urination, burning sensation, blood in urine, odor or discharge. Skin: Denies redness, rashes, lesions or  ulcercations.  Neurological: Denies dizziness, difficulty with memory, difficulty with speech or problems with balance and coordination.   No other specific complaints in a complete review of systems (except as listed in HPI above).  Objective:   Physical Exam    BP 120/72  Pulse 82  Temp(Src) 98 F (36.7 C) (Oral)  Ht 5\' 6"  (1.676 m)  Wt 208 lb (94.348 kg)  BMI 33.59 kg/m2  SpO2 96% Wt Readings from Last 3 Encounters:  07/20/12 208 lb (94.348 kg)  07/12/12 210 lb (95.255 kg)  07/07/12 210 lb (95.255 kg)    General: Appears his stated age, well developed, well nourished in NAD.Marland Kitchen  Cardiovascular: Normal rate and rhythm. S1,S2 noted.  No murmur, rubs or gallops noted. No JVD or BLE edema. No carotid bruits noted. Pulmonary/Chest: Normal effort and positive vesicular breath sounds. No respiratory distress. No wheezes, rales or ronchi noted.  Abdomen: Soft and nontender. Normal bowel sounds, no bruits noted. No distention or masses noted. Liver, spleen and kidneys non palpable. Neurological: Alert and oriented. Cranial nerves II-XII intact. Coordination normal. +DTRs bilaterally.   EKG:  BMET    Component Value Date/Time   NA 129* 07/07/2012 0906   K 4.9 07/07/2012 0906   CL 94* 07/07/2012 0906   CO2 25 07/07/2012 0906   GLUCOSE 463* 07/07/2012 0906   BUN 50* 07/07/2012 0906   CREATININE 2.2* 07/07/2012 0906   CALCIUM 9.9 07/07/2012 0906   GFRNONAA >60 02/05/2011 1410   GFRAA >60 02/05/2011 1410    Lipid Panel     Component Value Date/Time   CHOL 201* 07/07/2012 0906   TRIG 908.0 Triglyceride is over 400; calculations on Lipids are invalid.* 07/07/2012 0906   HDL 38.20* 07/07/2012 0906   CHOLHDL 5 07/07/2012 0906   VLDL 181.6* 07/07/2012 0906   LDLCALC 83 05/10/2012 1421    CBC    Component Value Date/Time   WBC 4.5 07/07/2012 0906   RBC 3.68* 07/07/2012 0906   HGB 11.0* 07/07/2012 0906   HCT 32.9* 07/07/2012 0906   PLT 233.0 07/07/2012 0906   MCV 89.2 07/07/2012 0906   MCH  31.1 02/05/2011 1410   MCHC 33.4 07/07/2012 0906   RDW 12.6 07/07/2012 0906   LYMPHSABS 1.7 02/23/2012 1008   MONOABS  0.5 02/23/2012 1008   EOSABS 0.2 02/23/2012 1008   BASOSABS 0.0 02/23/2012 1008    Hgb A1C Lab Results  Component Value Date   HGBA1C 9.4* 07/07/2012       Assessment & Plan:

## 2012-07-20 NOTE — Telephone Encounter (Signed)
Ash, Yes, until he see endocrinology. Rene Kocher

## 2012-07-21 ENCOUNTER — Telehealth: Payer: Self-pay | Admitting: *Deleted

## 2012-07-21 ENCOUNTER — Encounter: Payer: Managed Care, Other (non HMO) | Attending: Internal Medicine | Admitting: Dietician

## 2012-07-21 ENCOUNTER — Encounter: Payer: Self-pay | Admitting: Internal Medicine

## 2012-07-21 ENCOUNTER — Encounter: Payer: Self-pay | Admitting: Dietician

## 2012-07-21 VITALS — Ht 66.0 in | Wt 204.8 lb

## 2012-07-21 DIAGNOSIS — Z713 Dietary counseling and surveillance: Secondary | ICD-10-CM | POA: Insufficient documentation

## 2012-07-21 DIAGNOSIS — E1149 Type 2 diabetes mellitus with other diabetic neurological complication: Secondary | ICD-10-CM | POA: Insufficient documentation

## 2012-07-21 MED ORDER — INSULIN DETEMIR 100 UNIT/ML ~~LOC~~ SOLN: 30.0000 [IU] | Freq: Every day | SUBCUTANEOUS | Status: DC

## 2012-07-21 MED ORDER — INSULIN ASPART 100 UNIT/ML FLEXPEN: 8.0000 [IU] | Freq: Three times a day (TID) | SUBCUTANEOUS | Status: DC

## 2012-07-21 NOTE — Telephone Encounter (Signed)
PATIENT WENT TO NUTRIONIST TODAY AND WAS TOLD HE NEEDED TO CALL [facility] AND HAVE INSULIN INCREASED . SHE DID NOT THINK 5 UNITS WAS ENOUGH TO KEEP HIS BLOOD SUGAR IN CONTROLL. PATIENT STATES HE HAS LOST 6 LB AND IS PROUD OF HIMSELF FOR THAT. NEEDS TO KNOW WHAT TO INCREASE INSULIN TO . BLOOD SUGAR THIS AM 361 BEFORE BREAKFAST ; 424 BEFORE LUNCH TODAY; LAST NITE BEFORE SUPPER 374. PLEASE ADVISE [phone] OR HE STATED THEY HAVE SIGNED UP FOR  MY CHART ALSO.

## 2012-07-21 NOTE — Telephone Encounter (Signed)
Ash, He should increase his mealtime insulin to 8 units before each meal. Increase his Levemir to 30 units at night. Also, has he heard from [facility] regarding the endocrinology referral. Rene Kocher

## 2012-07-21 NOTE — Progress Notes (Signed)
Medical Nutrition Therapy:  Appt start time: 1030 end time:  1155.   Assessment:  Primary concerns today: Get some help with getting this sugar down. Comes with his wife.  He has seen the MD and has been told that he is having the beginnings of kidney problems with his diabetes.  He has had some diabetes medications changed to reflect preserving kidney function.  Today, he is quite anxious and it is very difficult to try to discern the pertinent issues surrounding his educational needs.  His wife is good at redirecting and helping to keep him on track.  His last A1C was at 9.4% and his GfR is at 39.8 (L) .  Today, he is complaining of numbness in his feet, "felling like they are asleep."  Since his MD appointment on 07/12/2012, he has lost 5.2 lb.  Unsure how much is related to diet and how much will involve high glucose levels.   HYPERGLYCEMIA:   S/S of increased thirst, increased urination, and increased hunger.  HYPOGLYCEMIA: Few reported instances.  Notes he gets a H/A usually as the glucose goes down.  BLOOD GLUCOSE MONITORING:  Has meter not recording his levels, or left his records at home.  Difficult to discern what is going on with his meter readings.  Fasting: Was greater than 400, now 361, 431, 371, 423.  AC Lunch:  430  AC Dinner: 441, 115, 325, 428   FOOT SELF-EXAM:  Doing daily exam.  Reviewed the procedure and foot care.  DILATED EYE EXAM:  Due  MEDICATIONS: Completed med review.  Levemir  20 units at HS and Humalog 5 units before meals.   DIETARY INTAKE:  Usual eating pattern includes 3 meals and few snacks per day.  Everyday foods include protein, starch, veggies, .  Avoided foods include Trying to get off of the sweets.    24-hr recall:  B ( AM):  8:00 Oatmeal 1 cup with 2 splendas and boiled egg and 4 oz milk  Snk ( AM): none  L ( PM): 12:00-1:00  Cheeseburger and diet Sprite.  Lettuce/tomato/  Snk ( PM): none D ( PM): 6:00 broccoli and baked fish. Snk ( PM):  8-9:00 PB crackers  Malt crackers. and cup of milk 8oz Beverages: milk, water  Usual physical activity: Treadmill 30 minutes for 2 times per week   Estimated energy needs: HT:  66 in   WT: 204.8 lb  BMI: 33.1 kg/m2  Adj WT: 166 (75 kg) 1700-1800 calories 195-200 g carbohydrates 130-135 g protein 46-49 g fat  Progress Towards Goal(s):  No progress.   Nutritional Diagnosis:  [place]-2.1 Inpaired nutrition utilization As related to glucose.  As evidenced by Diagnosis of type 2 diabetes needing Levemir and Humalog insulins for glucose control and A1C at 9.4%..    Intervention:  Nutrition/Diabetes Education:  Continue to monitor blood glucose levels and record using the log book.  Take log book to all MD appointments and to CDE visits.  Continue to take medications as ordered and follow-up as planned with MD for medication adjustments.  Try to increase your exercise to 20 minutes 5 days per week.  Exercise/walking on the teadmill will lower blood glucose.  Continue to try to lose some weight.  Weight loss decreases insulin resistance and helps with getting glucose into the cells.  Eat regular meals and snacks.  Try to have the same amount of carb at each meal.    Read food labels and start counting your carbs.  Try  to increase the fiber in products.  Look for 2 gm of fiber per slice of bread and 3 gm of fiber in your cereals, protein/cereal bars, and vegetables.  Try to keep the sugar on the food label to (0-9 gm) per serving.  Try to have a protein serving at all meals and snacks.  Meal= size of palm of hand and snack = 1-2 oz.    Try to have baked, broiled, grilled, roasted, stewed, steamed foods that are lean.  Limit/omit breading, sauces and gravies as they can increase glucose levels.  Try to have a free non-starchy veggie at all meals.  Try to limit fat intake to 2-3 servings for meals and 1 serving per snacks.  Use the label on the product to determine the serving  size.  With fats, use the regular or the lite product.  Avoid the fat free products, they contain more carb.  Monitor your serving sizes.  Aim for the 1700-1800 calories with 45-60 gm of CHO per meal and 15 gm at 1-2 snacks per day.  Use the Carb Counting Guide to assist with serving sizes and exchanges.  Plan to follow-up with me in the next 4-6 weeks.  Handouts given during visit include:  Living Well with Diabetes  Controlling Blood Glucose  Carb Counting Guide  Yellow Card with Carb/diet prescription  Monitoring/Evaluation:  Dietary intake, exercise, blood glucose levels, and body weight in 4-6 weeks.Marland Kitchen

## 2012-07-21 NOTE — Telephone Encounter (Signed)
Pt informed of MD's advisement regarding insulin adjustment.

## 2012-07-21 NOTE — Telephone Encounter (Signed)
Pt informed of NP's advisement.  

## 2012-07-22 ENCOUNTER — Encounter: Payer: Self-pay | Admitting: Internal Medicine

## 2012-07-22 ENCOUNTER — Ambulatory Visit: Payer: Managed Care, Other (non HMO) | Admitting: Internal Medicine

## 2012-07-23 ENCOUNTER — Encounter: Payer: Self-pay | Admitting: Internal Medicine

## 2012-07-24 ENCOUNTER — Encounter: Payer: Self-pay | Admitting: Internal Medicine

## 2012-07-25 ENCOUNTER — Encounter: Payer: Self-pay | Admitting: Dietician

## 2012-07-25 NOTE — Patient Instructions (Addendum)
   Continue to monitor blood glucose levels and record using the log book.  Take log book to all MD appointments and to CDE visits.  Continue to take medications as ordered and follow-up as planned with MD for medication adjustments.  Try to increase your exercise to 20 minutes 5 days per week.  Exercise/walking on the teadmill will lower blood glucose.  Continue to try to lose some weight.  Weight loss decreases insulin resistance and helps with getting glucose into the cells.  Eat regular meals and snacks.  Try to have the same amount of carb at each meal.    Read food labels and start counting your carbs.  Try to increase the fiber in products.  Look for 2 gm of fiber per slice of bread and 3 gm of fiber in your cereals, protein/cereal bars, and vegetables.  Try to keep the sugar on the food label to (0-9 gm) per serving.  Try to have a protein serving at all meals and snacks.  Meal= size of palm of hand and snack = 1-2 oz.    Try to have baked, broiled, grilled, roasted, stewed, steamed foods that are lean.  Limit/omit breading, sauces and gravies as they can increase glucose levels.  Try to have a free non-starchy veggie at all meals.  Try to limit fat intake to 2-3 servings for meals and 1 serving per snacks.  Use the label on the product to determine the serving size.  With fats, use the regular or the lite product.  Avoid the fat free products, they contain more carb.  Monitor your serving sizes.  Aim for the 1700-1800 calories with 45-60 gm of CHO per meal and 15 gm at 1-2 snacks per day.  Use the Carb Counting Guide to assist with serving sizes and exchanges.  Plan to follow-up with me in the next 4-6 weeks.

## 2012-07-26 ENCOUNTER — Ambulatory Visit (INDEPENDENT_AMBULATORY_CARE_PROVIDER_SITE_OTHER): Payer: Managed Care, Other (non HMO) | Admitting: Internal Medicine

## 2012-07-26 ENCOUNTER — Encounter: Payer: Self-pay | Admitting: Internal Medicine

## 2012-07-26 VITALS — BP 112/72 | HR 80 | Temp 98.0°F | Resp 10 | Ht 66.0 in | Wt 209.0 lb

## 2012-07-26 DIAGNOSIS — E1149 Type 2 diabetes mellitus with other diabetic neurological complication: Secondary | ICD-10-CM

## 2012-07-26 NOTE — Patient Instructions (Addendum)
Please return in 1 month with your sugar log. Please change the NovoLog as follows: - breakfast: 6 units - lunch: 8 units - dinner: 10 units Please increase the Levemir to 32 units.  Patient instructions for Diabetes mellitus type 2:  DIET AND EXERCISE Diet and exercise is an important part of diabetic treatment.  We recommended aerobic exercise in the form of brisk walking (working between 40-60% of maximal aerobic capacity, similar to brisk walking) for 150 minutes per week (such as 30 minutes five days per week) along with 3 times per week performing 'resistance' training (using various gauge rubber tubes with handles) 5-10 exercises involving the major muscle groups (upper body, lower body and core) performing 10-15 repetitions (or near fatigue) each exercise. Start at half the above goal but build slowly to reach the above goals. If limited by weight, joint pain, or disability, we recommend daily walking in a swimming pool with water up to waist to reduce pressure from joints while allow for adequate exercise.    BLOOD GLUCOSES Monitoring your blood glucoses is important for continued management of your diabetes. Please check your blood glucoses 3-4 times a day: fasting, before meals and at bedtime (you can rotate these measurements - e.g. one day check before the 3 meals, the next day check before 2 of the meals and before bedtime, etc.   HYPOGLYCEMIA (low blood sugar) Hypoglycemia is usually a reaction to not eating, exercising, or taking too much insulin/ other diabetes drugs.  Symptoms include tremors, sweating, hunger, confusion, headache, etc. Treat IMMEDIATELY with 15 grams of Carbs:   4 glucose tablets    cup regular juice/soda   2 tablespoons raisins   4 teaspoons sugar   1 tablespoon honey Recheck blood glucose in 15 mins and repeat above if still symptomatic/blood glucose <100. Please contact our office at [phone] if you have questions about how to next handle your  insulin.  RECOMMENDATIONS TO REDUCE YOUR RISK OF DIABETIC COMPLICATIONS: * Take your prescribed MEDICATION(S). * Follow a DIABETIC diet: Complex carbs, fiber rich foods, heart healthy fish twice weekly, (monounsaturated and polyunsaturated) fats * AVOID saturated/trans fats, high fat foods, >2,300 mg salt per day. * EXERCISE at least 5 times a week for 30 minutes or preferably daily.  * DO NOT SMOKE OR DRINK more than 1 drink a day. * Check your FEET every day. Do not wear tightfitting shoes. Contact us if you develop an ulcer * See your EYE doctor once a year or more if needed * Get a FLU shot once a year * Get a PNEUMONIA vaccine every 5 years and once after age 74 years  GOALS:  * Your Hemoglobin A1c of 7%  * Your Systolic BP should be 140 or lower  * Your Diastolic BP should be 80 or lower  * Your HDL (Good Cholesterol) should be 40 or higher  * Your LDL (Bad Cholesterol) should be 100 or lower  * Your Triglycerides should be 150 or lower  * Your Urine microalbumin (kidney function) of <30 * Your Body Mass Index should be 25 or lower  We will be glad to help you achieve these goals. Our telephone number is: [phone].

## 2012-07-26 NOTE — Progress Notes (Signed)
Subjective:     Patient ID: Mike Orozco, male   DOB: 10/10/1961, 52 y.o.   MRN: [id number]  HPI Mr. Cwynar is a pleasant 52 year old man, referred by PCP, Nicki Reaper, NP, for management of DM2, uncontrolled, insulin-dependent, with complications (chronic kidney disease - GFR 40, peripheral neuropathy, systolic CHF).  Mr. Zalewski has had diabetes for 3 weeks (CBG 400's, but no spx). His last hemoglobin A1c was 9.4%. He is now on a regimen of: - Januvia 100 mg daily - Levemir 30 units in at bedtime - Humalog 8 units 3 times a day with meals, added at 5 units 6 days ago, then increased to 8 units 3 days ago.  He checks his sugars 3-4 times a day. Per review of his meter, Date Before B 2h after B Before L 2h after L Before D 2h after D  03/10 218 194  197    03/09 193  274  125 237  03/08 267  179  158 151  03/07 248 264   230 321  He denies lows, lowest 120's.   He made diet changes and is now exercising (running on the treadmill every day 30 min post lunch), in an effort to reduce his sugars. He lost 5 pounds in 10 days, now gained some back. He saw nutrition 5 days ago.  Diet: - b'fast: boiled egg + 1 piece of ham + toast - lunch: eats out usually: salad with chicken, [place] - dinner; fish/chicken + steamed vegetables +/- 1/2 baked potato  His last BUN/creatinine was 50/2.2 on 07/07/2012. His last lipid panel showed a total cholesterol of 201, triglycerides of 908, HDL of 38. Last eye exam: no DR, last year. No numbness and tingling in legs.   His mother had diabetes and died of complications. His wife is also a diabetic.  The patient also has a history of anxiety, hypertension, hyperlipidemia, nonischemic cardiomyopathy, gout, anemia, kidney stones (sees urology), tobacco and alcohol abuse.   Review of Systems Constitutional: no weight gain/loss, no fatigue, no subjective hyperthermia/hypothermia Eyes: no blurry vision, no xerophthalmia ENT: no sore throat, no nodules palpated in  throat, no dysphagia/odynophagia, no hoarseness Cardiovascular: no CP/SOB/palpitations/leg swelling Respiratory: no cough/SOB Gastrointestinal: no N/V/D/C Musculoskeletal: no muscle/joint aches Skin: no rashes Neurological: no tremors/numbness/tingling/dizziness Psychiatric: no depression/anxiety  Past Medical History  Diagnosis Date  . Hypertension   . Heart failure, chronic systolic   . Non-ischemic cardiomyopathy     a) Echo 03/25/2010: EF 20-25%; mild AI; Mod MR; Mild LAE  b)cardiac cath 03/26/2010: normal cors; severe pulmonary HTN;PCWP 41; EF 10-15%;  c. echo 4/12:  EF 45-50%, mild LVH, grade 1 diast dysfxn, mild AI, mild LAE  . History of ETOH abuse   . Gout, unspecified   . Pure hyperglyceridemia   . Tobacco use disorder   . Anemia   . Chronic renal insufficiency   . Personal history of colonic polyps-adenoma 02/17/2012    01/2012 - diminutive adenoma Repeat colon about 01/2017    . Diabetes mellitus without complication    Past Surgical History  Procedure Laterality Date  . L5-s1 fusion      status post  . Cystectomy  1994    Removal from left lung  at [facility]   History   Social History  . Marital Status: Married    Spouse Name: N/A    Number of Children: 1  . Years of Education: N/A   Occupational History  . [employer]     [employer]   Social History Main Topics  . Smoking status: Former Smoker    Types: Cigarettes    Quit date: 02/27/2007  . Smokeless tobacco: Never Used  . Alcohol Use: No     Comment: Quit 02/2010  . Drug Use: Yes  . Sexually Active: Yes    Birth Control/ Protection: Condom   Other Topics Concern  . Not on file   Social History Narrative   No regular exercise   Daily caffeine         Current Outpatient Prescriptions on File Prior to Visit  Medication Sig Dispense Refill  . allopurinol (ZYLOPRIM) 300 MG tablet Take 1 tablet (300 mg total) by mouth daily.  90 tablet  3  . aspirin 81 MG EC tablet Take 81 mg by mouth daily.         . carvedilol (COREG) 25 MG tablet TAKE ONE TABLET BY MOUTH TWICE A DAY  60 tablet  11  . ferrous sulfate 325 (65 FE) MG tablet Take 325 mg by mouth daily with breakfast.      . glucose blood (ONE TOUCH ULTRA TEST) test strip Use as instructed to check blood sugar once daily dx 250.02  100 each  3  . insulin aspart 100 unit/ml SOLN Inject 8 Units into the skin 3 (three) times daily before meals.  3 pen  0  . insulin detemir (LEVEMIR FLEXPEN) 100 UNIT/ML injection Inject 30 Units into the skin at bedtime.  10 mL  12  . Insulin Pen Needle 32G X 4 MM MISC Use as directed to inject insulin once daily dx 250.60  100 each  3  . LIPOFEN 150 MG CAPS TAKE 1 CAPSULE (150 MG TOTAL) BY MOUTH DAILY.  30 capsule  5  . lisinopril (PRINIVIL,ZESTRIL) 40 MG tablet TAKE 1 TABLET BY MOUTH DAILY  90 tablet  3  . Multiple Vitamin (MULTIVITAMIN) capsule Take 1 capsule by mouth daily.      Letta Pate DELICA LANCETS 33G MISC 1 each by Does not apply route daily. Dx 250.02  100 each  3  . pravastatin (PRAVACHOL) 40 MG tablet TAKE 1 TABLET BY MOUTH EVERY EVENING  30 tablet  11  . sitaGLIPtin (JANUVIA) 50 MG tablet Take 1 tablet (50 mg total) by mouth daily.  30 tablet  0  . Beclomethasone Dipropionate (QNASL) 80 MCG/ACT AERS Place 2 puffs into the nose daily.  8.7 g  11  . methocarbamol (ROBAXIN) 500 MG tablet Take 1 tablet (500 mg total) by mouth 4 (four) times daily.  75 tablet  1  . spironolactone (ALDACTONE) 25 MG tablet TAKE 1 TABLET BY MOUTH EVERY DAY  30 tablet  6   No current facility-administered medications on file prior to visit.   No Known Allergies  Family History  Problem Relation Age of Onset  . Diabetes Mother   . Diabetes Father   . Hypertension Mother   . Hypertension Father   . Colon cancer Neg Hx    Objective:   Physical Exam BP 112/72  Pulse 80  Temp(Src) 98 F (36.7 C) (Oral)  Resp 10  Ht 5\' 6"  (1.676 m)  Wt 209 lb (94.802 kg)  BMI 33.75 kg/m2  SpO2 96% Wt Readings from Last 3  Encounters:  07/26/12 209 lb (94.802 kg)  07/21/12 204 lb 12.8 oz (92.897 kg)  07/20/12 208 lb (94.348 kg)  Constitutional: overweight, in NAD Eyes: PERRLA, EOMI, no exophthalmos ENT: moist mucous membranes, no thyromegaly, no cervical  lymphadenopathy Cardiovascular: RRR, No MRG Respiratory: CTA B Gastrointestinal: abdomen soft, NT, ND, BS+ Musculoskeletal: no deformities, strength intact in all 4 Skin: moist, warm, no rashes, 1 nodule under the skin of the abdomen from a previous insulin injection. Otherwise no bruises. Neurological: no tremor with outstretched hands, DTR normal in all 4  Assessment:     1. DM2, uncontrolled, insulin-dependent, with complications - chronic kidney disease, GFR 40 - sees Dr. Hyman Hopes - peripheral neuropathy - Chronic systolic CHF  Plan:   Patient with recently diagnosed diabetes, now with improved control after adding basal bolus regimen along with Januvia. His sugars appear higher in the morning and decreasing during the day. - Due to the fact that he has different size meals, with dinner being the largest, will change the NovoLog doses as follows: Breakfast 6 units, lunch 8 units, dinner 10 units. We briefly discussed about the possibility of calculating the mealtime insulin depending on the number of carbs used for a meal, however the patient is not ready to do this per his report. - I will also increase the Levemir a little bit (to 32 units) to help with his morning sugars - for now will keep Januvia, but these can probably be discontinued in the future - patient tells me that his goal is to come off insulin for his ready to exercise and to improve his diet is much as it takes to achieve this goal - I advised him not to exercise 30 minutes after lunch and to move exercise before dinner, if he prefers to do it in the afternoon - Advised to keep the insulin out of the refrigerator if in use - I advised to ordered the dressing on the side,  and only use  half of it,  whenever he goes out and has a salad - given sugar log and advised how to fill it and to bring it at next appt - given foot care handout and explained the principles - given instructions for hypoglycemia management "15-15 rule" - given brochures about insulin injection sites and healthy nutrition - I would see him back in 1 month with his sugar log - I encouraged him to send me sugars to my chart once a week

## 2012-07-27 ENCOUNTER — Encounter: Payer: Self-pay | Admitting: Internal Medicine

## 2012-07-29 ENCOUNTER — Encounter: Payer: Self-pay | Admitting: Internal Medicine

## 2012-07-30 ENCOUNTER — Other Ambulatory Visit: Payer: Self-pay | Admitting: Internal Medicine

## 2012-07-30 ENCOUNTER — Other Ambulatory Visit: Payer: Self-pay | Admitting: *Deleted

## 2012-07-30 MED ORDER — GLUCOSE BLOOD VI STRP: ORAL_STRIP | Status: DC

## 2012-07-30 NOTE — Telephone Encounter (Signed)
Had to change the sig on the script, for pt, to read test blood sugar before and after breakfast, before and after lunch, before and after dinner, and at bedtime. (Total of 7 times daily) Done.

## 2012-08-04 ENCOUNTER — Encounter: Payer: Self-pay | Admitting: Internal Medicine

## 2012-08-05 ENCOUNTER — Ambulatory Visit: Payer: Managed Care, Other (non HMO) | Admitting: Internal Medicine

## 2012-08-05 ENCOUNTER — Telehealth: Payer: Self-pay | Admitting: Internal Medicine

## 2012-08-05 ENCOUNTER — Other Ambulatory Visit: Payer: Self-pay | Admitting: Internal Medicine

## 2012-08-05 NOTE — Telephone Encounter (Signed)
ERROR

## 2012-08-09 ENCOUNTER — Telehealth: Payer: Self-pay

## 2012-08-09 ENCOUNTER — Telehealth: Payer: Self-pay | Admitting: Internal Medicine

## 2012-08-09 DIAGNOSIS — E781 Pure hyperglyceridemia: Secondary | ICD-10-CM

## 2012-08-09 MED ORDER — ONETOUCH DELICA LANCETS 33G MISC: 1.0000 | Freq: Every day | Status: DC

## 2012-08-09 MED ORDER — FENOFIBRIC ACID 105 MG PO TABS: 1.0000 | ORAL_TABLET | Freq: Every day | ORAL | Status: DC

## 2012-08-09 NOTE — Telephone Encounter (Signed)
Patient called [facility] stating that insurance will not cover lipofen 150mg . They are requesting that medication is switched to fenofibric 105mg . Please advise Thanks

## 2012-08-09 NOTE — Telephone Encounter (Signed)
The pt called and is hoping to get an rx for lancets that work with his testing strips sent to the [facility]. Thanks!     Callback - [phone]

## 2012-08-10 ENCOUNTER — Other Ambulatory Visit: Payer: Self-pay | Admitting: Urology

## 2012-08-10 ENCOUNTER — Telehealth: Payer: Self-pay | Admitting: *Deleted

## 2012-08-10 NOTE — Telephone Encounter (Signed)
That is excellent news. 

## 2012-08-10 NOTE — Telephone Encounter (Signed)
Pt called and lvm asking if Dr Elvera Lennox would like for him to continue taking Januvia because he does not have any more refills. Please advise if you want me to refill this for him. Thank you!

## 2012-08-10 NOTE — Telephone Encounter (Signed)
Called Mike Orozco and let him know that Dr Elvera Lennox said he can discontinue using the Januvia, now that he has started mealtime insulin. Mike Orozco happy and wants Dr Elvera Lennox to know his blood glucose is staying in the 100's.

## 2012-08-10 NOTE — Telephone Encounter (Signed)
He can discontinue Januvia, now that we started mealtime insulin. Thank you.

## 2012-08-10 NOTE — Telephone Encounter (Signed)
Pt called and lvm asking if Dr Elvera Lennox would like for him to continue taking [place] because he does not have any more. Please advise if you want me to refill this for him. Thank you!

## 2012-08-12 ENCOUNTER — Encounter (HOSPITAL_BASED_OUTPATIENT_CLINIC_OR_DEPARTMENT_OTHER): Payer: Self-pay | Admitting: *Deleted

## 2012-08-12 NOTE — Progress Notes (Signed)
NPO  AFTER MN WITH EXCEPTION WATER/ GATORADE UNTIL 0730. ARRIVES AT 1215. NEEDS ISTAT8. CURRENT EKG, CXR IN EPIC AND CHART. ALSO, LOV NOTE  AND LAB WORK W/ DR WEBB IN CHART. WILL TAKE COREG W/ SIP OF WATER.

## 2012-08-13 ENCOUNTER — Encounter: Payer: Self-pay | Admitting: Internal Medicine

## 2012-08-18 ENCOUNTER — Ambulatory Visit (HOSPITAL_BASED_OUTPATIENT_CLINIC_OR_DEPARTMENT_OTHER): Payer: Managed Care, Other (non HMO) | Admitting: Anesthesiology

## 2012-08-18 ENCOUNTER — Ambulatory Visit (HOSPITAL_BASED_OUTPATIENT_CLINIC_OR_DEPARTMENT_OTHER)
Admission: RE | Admit: 2012-08-18 | Discharge: 2012-08-18 | Disposition: A | Discharge status: Home / Self Care | Payer: Managed Care, Other (non HMO) | Source: Ambulatory Visit | Attending: Urology | Admitting: Urology

## 2012-08-18 ENCOUNTER — Encounter (HOSPITAL_BASED_OUTPATIENT_CLINIC_OR_DEPARTMENT_OTHER): Payer: Self-pay | Admitting: Anesthesiology

## 2012-08-18 ENCOUNTER — Encounter (HOSPITAL_BASED_OUTPATIENT_CLINIC_OR_DEPARTMENT_OTHER): Payer: Self-pay | Admitting: *Deleted

## 2012-08-18 ENCOUNTER — Encounter (HOSPITAL_BASED_OUTPATIENT_CLINIC_OR_DEPARTMENT_OTHER)
Admission: RE | Disposition: A | Discharge status: Home / Self Care | Payer: Self-pay | Source: Ambulatory Visit | Attending: Urology

## 2012-08-18 DIAGNOSIS — I440 Atrioventricular block, first degree: Secondary | ICD-10-CM | POA: Insufficient documentation

## 2012-08-18 DIAGNOSIS — Z87891 Personal history of nicotine dependence: Secondary | ICD-10-CM | POA: Insufficient documentation

## 2012-08-18 DIAGNOSIS — N135 Crossing vessel and stricture of ureter without hydronephrosis: Secondary | ICD-10-CM | POA: Insufficient documentation

## 2012-08-18 DIAGNOSIS — I5022 Chronic systolic (congestive) heart failure: Secondary | ICD-10-CM | POA: Insufficient documentation

## 2012-08-18 DIAGNOSIS — N35919 Unspecified urethral stricture, male, unspecified site: Secondary | ICD-10-CM | POA: Insufficient documentation

## 2012-08-18 DIAGNOSIS — M109 Gout, unspecified: Secondary | ICD-10-CM | POA: Insufficient documentation

## 2012-08-18 DIAGNOSIS — E119 Type 2 diabetes mellitus without complications: Secondary | ICD-10-CM | POA: Insufficient documentation

## 2012-08-18 DIAGNOSIS — N189 Chronic kidney disease, unspecified: Secondary | ICD-10-CM | POA: Insufficient documentation

## 2012-08-18 DIAGNOSIS — Z794 Long term (current) use of insulin: Secondary | ICD-10-CM | POA: Insufficient documentation

## 2012-08-18 DIAGNOSIS — I2789 Other specified pulmonary heart diseases: Secondary | ICD-10-CM | POA: Insufficient documentation

## 2012-08-18 DIAGNOSIS — Q613 Polycystic kidney, unspecified: Secondary | ICD-10-CM | POA: Insufficient documentation

## 2012-08-18 DIAGNOSIS — N200 Calculus of kidney: Secondary | ICD-10-CM | POA: Insufficient documentation

## 2012-08-18 DIAGNOSIS — I129 Hypertensive chronic kidney disease with stage 1 through stage 4 chronic kidney disease, or unspecified chronic kidney disease: Secondary | ICD-10-CM | POA: Insufficient documentation

## 2012-08-18 DIAGNOSIS — I2589 Other forms of chronic ischemic heart disease: Secondary | ICD-10-CM | POA: Insufficient documentation

## 2012-08-18 HISTORY — DX: Atrioventricular block, first degree: I44.0

## 2012-08-18 HISTORY — DX: Polycystic kidney, unspecified: Q61.3

## 2012-08-18 HISTORY — DX: Calculus of kidney: N20.0

## 2012-08-18 HISTORY — DX: Type 2 diabetes mellitus without complications: E11.9

## 2012-08-18 HISTORY — DX: Long term (current) use of insulin: Z79.4

## 2012-08-18 HISTORY — PX: CYSTOSCOPY WITH RETROGRADE PYELOGRAM, URETEROSCOPY AND STENT PLACEMENT: SHX5789

## 2012-08-18 LAB — POCT I-STAT, CHEM 8
BUN: 16 mg/dL (ref 6–23)
Calcium, Ion: 1.3 mmol/L — ABNORMAL HIGH (ref 1.12–1.23)
Chloride: 109 mEq/L (ref 96–112)
Creatinine, Ser: 1.5 mg/dL — ABNORMAL HIGH (ref 0.50–1.35)
Glucose, Bld: 110 mg/dL — ABNORMAL HIGH (ref 70–99)

## 2012-08-18 LAB — GLUCOSE, CAPILLARY: Glucose-Capillary: 105 mg/dL — ABNORMAL HIGH (ref 70–99)

## 2012-08-18 SURGERY — CYSTOURETEROSCOPY, WITH RETROGRADE PYELOGRAM AND STENT INSERTION
Anesthesia: General | Site: Ureter | Laterality: Right | Wound class: Clean Contaminated

## 2012-08-18 MED ORDER — PROPOFOL 10 MG/ML IV BOLUS
INTRAVENOUS | Status: DC | PRN
  Administered 2012-08-18: 260 mg via INTRAVENOUS

## 2012-08-18 MED ORDER — FENTANYL CITRATE 0.05 MG/ML IJ SOLN
25.0000 ug | INTRAMUSCULAR | Status: DC | PRN
  Filled 2012-08-18: qty 1

## 2012-08-18 MED ORDER — BELLADONNA ALKALOIDS-OPIUM 16.2-60 MG RE SUPP
RECTAL | Status: DC | PRN
  Administered 2012-08-18: 1 via RECTAL

## 2012-08-18 MED ORDER — CEPHALEXIN 500 MG PO CAPS: 500.0000 mg | ORAL_CAPSULE | Freq: Three times a day (TID) | ORAL | Status: DC

## 2012-08-18 MED ORDER — PHENAZOPYRIDINE HCL 100 MG PO TABS: 200.0000 mg | ORAL_TABLET | Freq: Three times a day (TID) | ORAL | Status: DC | PRN

## 2012-08-18 MED ORDER — ONDANSETRON HCL 4 MG/2ML IJ SOLN
INTRAMUSCULAR | Status: DC | PRN
  Administered 2012-08-18: 4 mg via INTRAVENOUS

## 2012-08-18 MED ORDER — LIDOCAINE HCL (CARDIAC) 20 MG/ML IV SOLN
INTRAVENOUS | Status: DC | PRN
  Administered 2012-08-18: 100 mg via INTRAVENOUS

## 2012-08-18 MED ORDER — HYOSCYAMINE SULFATE 0.125 MG PO TABS: 0.1250 mg | ORAL_TABLET | ORAL | Status: DC | PRN

## 2012-08-18 MED ORDER — OXYCODONE-ACETAMINOPHEN 5-325 MG PO TABS: 1.0000 | ORAL_TABLET | ORAL | Status: DC | PRN

## 2012-08-18 MED ORDER — LACTATED RINGERS IV SOLN
INTRAVENOUS | Status: DC | PRN
  Administered 2012-08-18 (×2): via INTRAVENOUS

## 2012-08-18 MED ORDER — PROMETHAZINE HCL 25 MG/ML IJ SOLN
6.2500 mg | INTRAMUSCULAR | Status: DC | PRN
  Filled 2012-08-18: qty 1

## 2012-08-18 MED ORDER — SENNOSIDES-DOCUSATE SODIUM 8.6-50 MG PO TABS: 1.0000 | ORAL_TABLET | Freq: Two times a day (BID) | ORAL | Status: DC

## 2012-08-18 MED ORDER — LACTATED RINGERS IV SOLN
INTRAVENOUS | Status: DC
  Filled 2012-08-18: qty 1000

## 2012-08-18 MED ORDER — CEFAZOLIN SODIUM-DEXTROSE 2-3 GM-% IV SOLR
INTRAVENOUS | Status: DC | PRN
  Administered 2012-08-18: 2 g via INTRAVENOUS

## 2012-08-18 MED ORDER — MIDAZOLAM HCL 5 MG/5ML IJ SOLN
INTRAMUSCULAR | Status: DC | PRN
  Administered 2012-08-18: 2 mg via INTRAVENOUS

## 2012-08-18 MED ORDER — IOHEXOL 350 MG/ML SOLN
INTRAVENOUS | Status: DC | PRN
  Administered 2012-08-18: 10 mL

## 2012-08-18 MED ORDER — BACITRACIN-NEOMYCIN-POLYMYXIN 400-5-5000 EX OINT: TOPICAL_OINTMENT | Freq: Three times a day (TID) | CUTANEOUS | Status: DC

## 2012-08-18 MED ORDER — KETOROLAC TROMETHAMINE 30 MG/ML IJ SOLN
INTRAMUSCULAR | Status: DC | PRN
  Administered 2012-08-18: 30 mg via INTRAVENOUS

## 2012-08-18 MED ORDER — SODIUM CHLORIDE 0.9 % IR SOLN
Status: DC | PRN
  Administered 2012-08-18: 6000 mL

## 2012-08-18 MED ORDER — MEPERIDINE HCL 25 MG/ML IJ SOLN
6.2500 mg | INTRAMUSCULAR | Status: DC | PRN
  Filled 2012-08-18: qty 1

## 2012-08-18 MED ORDER — OXYBUTYNIN CHLORIDE 5 MG PO TABS: 5.0000 mg | ORAL_TABLET | Freq: Four times a day (QID) | ORAL | Status: DC | PRN

## 2012-08-18 MED ORDER — FENTANYL CITRATE 0.05 MG/ML IJ SOLN
INTRAMUSCULAR | Status: DC | PRN
  Administered 2012-08-18 (×4): 25 ug via INTRAVENOUS
  Administered 2012-08-18: 50 ug via INTRAVENOUS
  Administered 2012-08-18 (×2): 25 ug via INTRAVENOUS

## 2012-08-18 MED ORDER — LACTATED RINGERS IV SOLN
INTRAVENOUS | Status: DC
  Administered 2012-08-18: 13:00:00 via INTRAVENOUS
  Filled 2012-08-18: qty 1000

## 2012-08-18 MED ORDER — CEFAZOLIN SODIUM-DEXTROSE 2-3 GM-% IV SOLR
2.0000 g | INTRAVENOUS | Status: DC
  Filled 2012-08-18: qty 50

## 2012-08-18 SURGICAL SUPPLY — 49 items
ADAPTER CATH URET PLST 4-6FR (CATHETERS) IMPLANT
ADPR CATH URET STRL DISP 4-6FR (CATHETERS)
BAG DRAIN URO-CYSTO SKYTR STRL (DRAIN) ×3 IMPLANT
BAG DRN UROCATH (DRAIN) ×2
BAG URINE DRAINAGE (UROLOGICAL SUPPLIES) ×2 IMPLANT
BAG URINE LEG 500ML (DRAIN) ×4 IMPLANT
BASKET LASER NITINOL 1.9FR (BASKET) IMPLANT
BASKET STNLS GEMINI 4WIRE 3FR (BASKET) IMPLANT
BASKET ZERO TIP NITINOL 2.4FR (BASKET) IMPLANT
BRUSH URET BIOPSY 3F (UROLOGICAL SUPPLIES) IMPLANT
BSKT STON RTRVL 120 1.9FR (BASKET)
BSKT STON RTRVL GEM 120X11 3FR (BASKET)
BSKT STON RTRVL ZERO TP 2.4FR (BASKET)
CANISTER SUCT LVC 12 LTR MEDI- (MISCELLANEOUS) ×2 IMPLANT
CATH CLEAR GEL 3F BACKSTOP (CATHETERS) IMPLANT
CATH COUDE FOLEY 2W 5CC 20FR (CATHETERS) ×2 IMPLANT
CATH INTERMIT  6FR 70CM (CATHETERS) IMPLANT
CATH URET 5FR 28IN CONE TIP (BALLOONS)
CATH URET 5FR 28IN OPEN ENDED (CATHETERS) ×2 IMPLANT
CATH URET 5FR 70CM CONE TIP (BALLOONS) IMPLANT
CATH URET DUAL LUMEN 6-10FR 50 (CATHETERS) IMPLANT
CLOTH BEACON ORANGE TIMEOUT ST (SAFETY) ×3 IMPLANT
DRAPE CAMERA CLOSED 9X96 (DRAPES) ×3 IMPLANT
ELECT REM PT RETURN 9FT ADLT (ELECTROSURGICAL)
ELECTRODE REM PT RTRN 9FT ADLT (ELECTROSURGICAL) IMPLANT
GLOVE BIO SURGEON STRL SZ7 (GLOVE) ×3 IMPLANT
GLOVE ECLIPSE 7.0 STRL STRAW (GLOVE) ×5 IMPLANT
GLOVE INDICATOR 7.5 STRL GRN (GLOVE) ×3 IMPLANT
GLOVE SKINSENSE NS SZ7.0 (GLOVE) ×1
GLOVE SKINSENSE STRL SZ7.0 (GLOVE) ×1 IMPLANT
GOWN PREVENTION PLUS LG XLONG (DISPOSABLE) ×5 IMPLANT
GUIDEWIRE 0.038 PTFE COATED (WIRE) IMPLANT
GUIDEWIRE ANG ZIPWIRE 038X150 (WIRE) IMPLANT
GUIDEWIRE STR DUAL SENSOR (WIRE) ×3 IMPLANT
IV NS IRRIG 3000ML ARTHROMATIC (IV SOLUTION) ×5 IMPLANT
KIT BALLIN UROMAX 15FX10 (LABEL) IMPLANT
KIT BALLN UROMAX 15FX4 (MISCELLANEOUS) IMPLANT
KIT BALLN UROMAX 26 75X4 (MISCELLANEOUS)
LASER FIBER DISP (UROLOGICAL SUPPLIES) IMPLANT
PACK CYSTOSCOPY (CUSTOM PROCEDURE TRAY) ×3 IMPLANT
SET HIGH PRES BAL DIL (LABEL)
SHEATH ACCESS URETERAL 38CM (SHEATH) IMPLANT
SHEATH ACCESS URETERAL 54CM (SHEATH) ×2 IMPLANT
SHEATH URET ACCESS 12FR/35CM (UROLOGICAL SUPPLIES) IMPLANT
SHEATH URET ACCESS 12FR/55CM (UROLOGICAL SUPPLIES) IMPLANT
STENT URET 6FRX24 CONTOUR (STENTS) ×2 IMPLANT
SYRINGE 10CC LL (SYRINGE) ×2 IMPLANT
SYRINGE IRR TOOMEY STRL 70CC (SYRINGE) IMPLANT
WATER STERILE IRR 500ML POUR (IV SOLUTION) ×2 IMPLANT

## 2012-08-18 NOTE — Transfer of Care (Signed)
Immediate Anesthesia Transfer of Care Note  Patient: Mike Orozco  Procedure(s) Performed: Procedure(s) (LRB): CYSTOSCOPY WITH RETROGRADE PYELOGRAM, URETEROSCOPY AND STENT PLACEMENT (Right)  Patient Location: PACU  Anesthesia Type: General  Level of Consciousness: awake, sedated, patient cooperative and responds to stimulation  Airway & Oxygen Therapy: Patient Spontanous Breathing and Patient connected to face mask oxygen  Post-op Assessment: Report given to PACU RN, Post -op Vital signs reviewed and stable and Patient moving all extremities  Post vital signs: Reviewed and stable  Complications: No apparent anesthesia complications

## 2012-08-18 NOTE — Op Note (Signed)
Urology Operative Report  Date of Procedure: 08/18/12  Surgeon: Natalia Leatherwood, MD Assistant: None  Preoperative Diagnosis: Right nephrolithiasis Postoperative Diagnosis:  Same. Urethral stricture. Right ureter stenosis.  Procedure(s): Cystoscopy Right retrograde pyelogram Right ureteroscopy Right ureter stent placement Foley catheter placement  Estimated blood loss: Minimal  Specimen: None  Drains: Foley (20 Fr)  Complications: None  Findings: Bulbar urethral stricture which was just large enough to allow passage of the rigid cystoscope. There was also narrowing in the right ureter including the distal ureter and the mid ureter at the level of L4. No hydronephrosis or filling defects noted on retrograde pyelogram.  History of present illness: 52 year old male presents today with nephrolithiasis. His stones have been growing in size and he presents today for elective removal of the stones with ureteroscopy.   Procedure in detail: After informed consent was obtained, the patient was taken to the operating room. They were placed in the supine position. SCDs were turned on and in place. IV antibiotics were infused, and general anesthesia was induced. A timeout was performed in which the correct patient, surgical site, and procedure were identified and agreed upon by the team. A belladonna and opium suppository was placed into the patient's rectum.  The patient was placed in a dorsolithotomy position, making sure to pad all pertinent neurovascular pressure points. The genitals were prepped and draped in the usual sterile fashion.  A rigid cystoscope was advanced through the urethra. In the bulbar urethra, there was a stricture encountered. This was approximately the same size as the cystoscope. I was able to gently pass the scope and dilate this area with ease. I then passed the scope into the bladder. The bladder was drained, and then filled with sterile fluid. The bladder was  evaluated in a systematic fashion, and there were no lesions noted. Attention was turned to the right ureter orifice. There was clear urine effluxing from the right and left ureter orifice. The right orifice was then cannulated with a 5 [place] ureter sheath and I injected contrast to obtain a retrograde pyelogram. There was noted to be some narrowing in the right distal ureter. There was no hydronephrosis or filling defects other than a stone in the right lower pole of the kidney. I then placed 2 sensor tip wires up the right ureter and into the right renal pelvis under fluoroscopy with ease. I attempted to place a 12/14 ureter access sheath but this would only go into the distal ureter. I then removed this off of the wire in place the obturator own a which I was able to advance to the level of L4. The obturator was then removed and the operator and sheath were placed over the working wire and the safety wire was secured to the drape. This was able to be passed to the level of L4; the operator and wire were removed. Flexible digital ureter scope was placed up the sheath and into the ureter. I was unable to navigate the ureter scope beyond this narrowing in the ureter. I then slowly withdrew the ureter access sheath and found there to be no mucosal disruption, although the mucosa had clearly been stretched by the access sheath. With the safety wire in place I shot a retrograde pyelogram which showed no extravasation from the ureter. The rigid cystoscope was loaded back onto the safety wire and a 6 x 24 double-J ureter stent was placed into the right renal pelvis under fluoroscopy with ease. The safety strings were removed. I then elected  to place a Foley catheter given the fact that his bulbar urethral stricture had been dilated and there was a small amount of urethral mucosal disruption. A 20 French coud-tip catheter was placed with ease with 10 cc of sterile water placed into the balloon.  This completed the  procedure. The patient was placed back in a supine position and anesthesia was reversed. He was taken to the PACU in stable condition. He was given 3 days' worth of Keflex to take now and another 3 days' worth to take prior to his catheter and stent removal.  I discussed with the patient the surgical findings. We discussed options including waiting a period of time and attempting ureteroscopy again after the stent has been in place to allow dilation. We also discussed receiving with right percutaneous nephrostolithotomy. I am concerned that proceeding with ureteroscopy will result in the need for balloon dilation which I fear would worsen any narrowing or scarring that is arty present in his right ureter. I recommend leaving the catheter in place about 8-10 days, and stent in place at least 2 weeks.  We discussed options for stone treatment which include observation, repeat ureteroscopy after the stent has been in place for passive dilation, and PCNL. For PCNL I described the risks including heart attack, sure, pulmonary embolus, death, positioning injury, pneumothorax, hydrothorax, need for chest tube, inability to clear stone burden, renal laceration, arterial venous fistula or malformation, need for embolization of kidney, loss of kidney or renal function, need for repeat procedure, need for prolonged nephrostomy tube, ureteral avulsion, fistula. I explained he would have to spend the night in the hospital for PCNL.

## 2012-08-18 NOTE — Anesthesia Postprocedure Evaluation (Signed)
   Immediate Anesthesia Transfer of Care Note  Patient: Mike Orozco  Procedure(s) Performed: Procedure(s) (LRB): CYSTOSCOPY WITH RETROGRADE PYELOGRAM, URETEROSCOPY AND STENT PLACEMENT (Right)  Patient Location: PACU  Anesthesia Type: General  Level of Consciousness: awake, sedated, patient cooperative and responds to stimulation  Airway & Oxygen Therapy: Patient Spontanous Breathing and Patient connected to face mask oxygen  Post-op Assessment: Report given to PACU RN, Post -op Vital signs reviewed and stable and Patient moving all extremities  Post vital signs: Reviewed and stable  Complications: No apparent anesthesia complications

## 2012-08-18 NOTE — Addendum Note (Signed)
Addendum created 08/18/12 1408 by Jessica Priest, CRNA   Modules edited: Anesthesia Events

## 2012-08-18 NOTE — Anesthesia Procedure Notes (Signed)
Procedure Name: LMA Insertion Date/Time: 08/18/2012 1:01 PM Performed by: Jessica Priest Pre-anesthesia Checklist: Patient identified, Emergency Drugs available, Suction available and Patient being monitored Patient Re-evaluated:Patient Re-evaluated prior to inductionOxygen Delivery Method: Circle System Utilized Preoxygenation: Pre-oxygenation with 100% oxygen Intubation Type: IV induction Ventilation: Mask ventilation without difficulty LMA: LMA with gastric port inserted LMA Size: 5.0 Number of attempts: 1 Placement Confirmation: positive ETCO2 Tube secured with: Tape Dental Injury: Teeth and Oropharynx as per pre-operative assessment

## 2012-08-18 NOTE — Anesthesia Postprocedure Evaluation (Signed)
Anesthesia Post Note  Patient: Mike Orozco  Procedure(s) Performed: Procedure(s) (LRB): CYSTOSCOPY WITH RETROGRADE PYELOGRAM, URETEROSCOPY AND STENT PLACEMENT (Right)  Anesthesia type: General  Patient location: PACU  Post pain: Pain level controlled  Post assessment: Post-op Vital signs reviewed  Last Vitals:  Filed Vitals:   08/18/12 1211  BP: 122/82  Pulse: 57  Temp: 36.3 C  Resp: 20    Post vital signs: Reviewed  Level of consciousness: sedated  Complications: No apparent anesthesia complications

## 2012-08-18 NOTE — Transfer of Care (Signed)
Immediate Anesthesia Transfer of Care Note  Patient: Mike Orozco  Procedure(s) Performed: Procedure(s) (LRB): CYSTOSCOPY WITH RETROGRADE PYELOGRAM, URETEROSCOPY AND STENT PLACEMENT (Right)  Patient Location: PACU  Anesthesia Type: General  Level of Consciousness: awake, sedated, patient cooperative and responds to stimulation  Airway & Oxygen Therapy: Patient Spontanous Breathing and Patient connected to face mask oxygen  Post-op Assessment: Report given to PACU RN, Post -op Vital signs reviewed and stable and Patient moving all extremities  Post vital signs: Reviewed and stable  Complications: No apparent anesthesia complications 

## 2012-08-18 NOTE — Anesthesia Preprocedure Evaluation (Signed)
Anesthesia Evaluation  Patient identified by MRN, date of birth, ID band Patient awake    Reviewed: Allergy & Precautions, H&P , NPO status , Patient's Chart, lab work & pertinent test results  Airway Mallampati: II TM Distance: >3 FB Neck ROM: Full    Dental no notable dental hx.    Pulmonary neg pulmonary ROS,  breath sounds clear to auscultation  Pulmonary exam normal       Cardiovascular hypertension, Pt. on medications - dysrhythmias Rhythm:Regular Rate:Normal  Non-ischemic cardiomyopathy  a) Echo 03/25/2010: EF 20-25%; mild AI; Mod MR; Mild LAE  b)cardiac cath 03/26/2010: normal cors; severe pulmonary HTN;PCWP 41; EF 10-15%;  c. echo 4/12:  EF 45-50%, mild LVH, grade 1 diast dysfxn, mild AI, mild LAE   Neuro/Psych negative neurological ROS  negative psych ROS   GI/Hepatic negative GI ROS, (+)     substance abuse  alcohol use, H/o alcohol abuse in past   Endo/Other  diabetes, Insulin Dependent  Renal/GU Renal InsufficiencyRenal disease  negative genitourinary   Musculoskeletal negative musculoskeletal ROS (+)   Abdominal   Peds negative pediatric ROS (+)  Hematology negative hematology ROS (+)   Anesthesia Other Findings   Reproductive/Obstetrics negative OB ROS                           Anesthesia Physical Anesthesia Plan  ASA: II  Anesthesia Plan: General   Post-op Pain Management:    Induction: Intravenous  Airway Management Planned: LMA  Additional Equipment:   Intra-op Plan:   Post-operative Plan: Extubation in OR  Informed Consent: I have reviewed the patients History and Physical, chart, labs and discussed the procedure including the risks, benefits and alternatives for the proposed anesthesia with the patient or authorized representative who has indicated his/her understanding and acceptance.   Dental advisory given  Plan Discussed with: CRNA  Anesthesia Plan  Comments:         Anesthesia Quick Evaluation

## 2012-08-18 NOTE — H&P (Signed)
Urology History and Physical Exam  CC: Right nephrolithiasis  HPI:  52 year old male presents today for nephrolithiasis. This is been a chronic problem. It has been bilateral in nature, but today we are focusing on the right side. CT scan of the abdomen and pelvis 07/14/12 revealed his stones had enlarged from a previous CT scan in 2012. His right kidney contained stones which measured 1.2 cm in the right lower pole, 0.5 cm in the right mid pole, 0.2 cm also in the right side. He had a very small left-sided stone in the left kidney which a 0.3 cm. These were not obstructing and not associated with hydronephrosis. Given the fact that the stones were enlarging, he opted for intervention. We've discussed the risks, benefits, alternatives, and likelihood of achieving goals. He does have renal insufficiency and I felt it was appropriate to treat the stones so that they would not cause any further damage to his kidneys. He presents today for cystoscopy, right retrograde pyelogram, right ureteroscopy, laser lithotripsy, and possible right ureter stent placement. UA on 08/10/12 was negative for signs of infection.  PMH: Past Medical History  Diagnosis Date  . Heart failure, chronic systolic   . Non-ischemic cardiomyopathy SECONDARY TO HX UNCONTROLLED HTN AND ETOH ABUSE----  LAST ECHO EF 50%  09-22-2011    a) Echo 03/25/2010: EF 20-25%; mild AI; Mod MR; Mild LAE  b)cardiac cath 03/26/2010: normal cors; severe pulmonary HTN;PCWP 41; EF 10-15%;  c. echo 4/12:  EF 45-50%, mild LVH, grade 1 diast dysfxn, mild AI, mild LAE  . History of ETOH abuse   . Gout, unspecified     stable  per pt  . Pure hyperglyceridemia   . Anemia   . Personal history of colonic polyps-adenoma 02/17/2012    01/2012 - diminutive adenoma Repeat colon about 01/2017    . Heart block AV first degree   . Hypertension     CARDIOLOGIST-  DR Jens Som--- [facility] IN [facility] 09-08-2011  . Chronic renal insufficiency baseline CRE  1.5--1.8   NEPHROLOGIST--  DR WEBB ([facility])  . Nephrolithiasis     RIGHT  . Polycystic kidney disease     BILATERAL RENAL CYST  . Diabetes mellitus type 2, insulin dependent PER pt and LAST PCP NOTE CBG'S IN 100'    RECENTLY UNCONTROLLED AIC 07-07-2012  9.4 AND CBG'S 400'S    PSH: Past Surgical History  Procedure Laterality Date  . Lumbar fusion  08-10-2003    L5  --  S1  . Removal cyst left lung  1994   AT [facility]    benign  . Knee arthroscopy w/ meniscectomy Left 02-06-2011    AND CHONDROPLASTY  . Cardiac catheterization  03-26-2010  DR [place]    SEVERE LV DYSFUNCTION WITH MARKEDLY ELEVATED FILLING PRESSURE/ SEVERE PULMONARY HYPERTENSION/ NORMAL CORONARY ANATOMY  . Transthoracic echocardiogram  09-22-2011  DR CRENSHAW    EF 50%/  MILD LVH/ GRADE I DIASTOLIC DYSFUNCTION/ MILD TR  . Rhinoplasty  1970'S    nose fx    Allergies: No Known Allergies  Medications: No prescriptions prior to admission     Social History: History   Social History  . Marital Status: Married    Spouse Name: N/A    Number of Children: 1  . Years of Education: N/A   Occupational History  . 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 Smoking status: Former Smoker -- 0.50 packs/day for 32 years  Types: Cigarettes    Quit date: 02/27/2007  . Smokeless tobacco: Never Used  . Alcohol Use: No     Comment: HX ALCOHOL ABUSE--  STATES  Quit 02/2010  . Drug Use: No  . Sexually Active: Not on file   Other Topics Concern  . Not on file   Social History Narrative   No regular exercise   Daily caffeine          Family History: Family History  Problem Relation Age of Onset  . Diabetes Mother   . Diabetes Father   . Hypertension Mother   . Hypertension Father   . Colon cancer Neg Hx     Review of Systems: Positive: None Negative: Fever, chest pain, or SOB.  A further 10 point review of systems was negative except what is listed in the HPI.  Physical Exam: Filed  Vitals:   08/18/12 1211  BP: 122/82  Pulse: 57  Temp: 97.3 F (36.3 C)  Resp: 20    General: No acute distress.  Awake. Head:  Normocephalic.  Atraumatic. ENT:  EOMI.  Mucous membranes moist Neck:  Supple.  No lymphadenopathy. CV:  S1 present. S2 present. Regular rate. Pulmonary: Equal effort bilaterally.  Clear to auscultation bilaterally. Abdomen: Soft.  None- tender to palpation. Skin:  Normal turgor.  No visible rash. Extremity: No gross deformity of bilateral upper extremities.  No gross deformity of    bilateral lower extremities. Neurologic: Alert. Appropriate mood.   Studies:  No results found for this basename: HGB, WBC, PLT,  in the last 72 hours  No results found for this basename: NA, K, CL, CO2, BUN, CREATININE, CALCIUM, MAGNESIUM, GFRNONAA, GFRAA,  in the last 72 hours   No results found for this basename: PT, INR, APTT,  in the last 72 hours   No components found with this basename: ABG,     Assessment:  Right nephrolithiasis  Plan: To OR for cystoscopy, right retrograde pyelogram, right ureteroscopy, laser lithotripsy, and possible right ureter stent placement.

## 2012-08-19 ENCOUNTER — Other Ambulatory Visit: Payer: Self-pay | Admitting: Urology

## 2012-08-19 ENCOUNTER — Other Ambulatory Visit: Payer: Self-pay | Admitting: Oncology

## 2012-08-19 ENCOUNTER — Other Ambulatory Visit (HOSPITAL_COMMUNITY): Payer: Self-pay | Admitting: Urology

## 2012-08-19 ENCOUNTER — Encounter (HOSPITAL_BASED_OUTPATIENT_CLINIC_OR_DEPARTMENT_OTHER): Payer: Self-pay | Admitting: Urology

## 2012-08-19 ENCOUNTER — Telehealth: Payer: Self-pay | Admitting: *Deleted

## 2012-08-19 DIAGNOSIS — N200 Calculus of kidney: Secondary | ICD-10-CM

## 2012-08-19 NOTE — Telephone Encounter (Signed)
Returned call to pt and he told me he did well during his urology/kidney stone procedure and he only took his evening insulin dose. He checked his bg this morning and it was 112. Pt also asked if he could change the time on his follow up on 08/28/10. Changed appt time to 11:30 am.

## 2012-08-20 ENCOUNTER — Encounter: Payer: Self-pay | Admitting: Internal Medicine

## 2012-08-24 ENCOUNTER — Encounter: Payer: Managed Care, Other (non HMO) | Attending: Internal Medicine | Admitting: Dietician

## 2012-08-24 ENCOUNTER — Encounter: Payer: Self-pay | Admitting: Dietician

## 2012-08-24 VITALS — Ht 66.0 in | Wt 206.6 lb

## 2012-08-24 DIAGNOSIS — E1149 Type 2 diabetes mellitus with other diabetic neurological complication: Secondary | ICD-10-CM | POA: Insufficient documentation

## 2012-08-24 DIAGNOSIS — Z713 Dietary counseling and surveillance: Secondary | ICD-10-CM | POA: Insufficient documentation

## 2012-08-24 NOTE — Patient Instructions (Addendum)
   Plan to have a protein at all meals and snacks. (meat, poultry, fish, shellfish, nuts, cheese.  Continue to keep your meals regular and try to have about the same amount of food each meal each day.  Get back to taking your Levemir insulin at night until your next surgery.  To get an idea about your A1C your can add up all your numbers/glucose readings and then divide by the number.  Use the graph of the A1C to guess about the level of your A1C  Keep up the water.

## 2012-08-24 NOTE — Progress Notes (Signed)
  Medical Nutrition Therapy:  Appt start time: 10:15 end time:  10:45  Assessment:  Primary concerns today: Today comes with issues with a kidney stone that is in the RT kidney.  Has a cathter with drainage bag and is for surgery the last of the month for stone removal. Unable to remove the stone via penis and the surgery will be a dorsal approach.  With the surgery, he notes a decrease in appetite and has been unable to exercise.  He has lost 3 lb since his visit to Dr. Charlean Sanfilippo office on 07/26/2012.  He reports that he has been checking his blood glucose more often and that with the insulin his glucose levels were improving.   Since his surgery, he has not resumed taking his Levemir insulin for the last 4 days and his fasting reflect this.  Encouraged to resume taking the Levemir insulin. He is sharing the appointment time with his wife today.  Both talk at length regarding his recent surgical experience.  He notes that he has decreased portions and is not drinking as many diet sodas as he was.  He has started to read some food labels.  He reports that his activity level has decreased with the surgery.   BLOOD GLUCOSE LEVELS:  Has his glucose log.  Fasting [phone]  PC Breakfast: 142-192-125  AC Lunch: [phone]  PC Lunch: [phone]  AC Dinner: [phone]  PC Dinner: [phone]  HS: [phone]    MEDICATIONS: Completed med review.  Currently using Novolog for glucose control.  Has stopped Levemir since d/c from hospital.  Encouraged him to take this evening.  DIETARY INTAKE:  24-hr recall:  B (7:00 AM): Smaller  Bowl of oatmeal from scratch 1 cup with Splenda, Daymian Lill have a egg.  Multigrain cheerios 1 cup with milk (soy) 1/2. water   Snk (mid AM) :none  L (12-1:00 PM): pretzels, water, unsalted potato chips.  Snk (mid PM): nuts or diet jelly or unsweetened fruit with cottage cheese. D (6:00 PM): tacos 4 soft/small with lettuce, tomatoes, salsa, sour  cream cheese, beef (low sodium), sprite zero  Snk (bedtime PM): 1 cup of sugar free banana pudding Beverages: Diet soda, water, soy milk.  Recent physical activity: limited since  Surgery, not using the treadmill daily.  Estimated energy needs:HT: 66 in  WT: 206.6 lb  BMI: 33.4 kg/m2  Ad WT:165 lb (76 kg) 1700-1800 calories (Currently his recall is closer to 1600-1700 which will facilitate more weight loss) 195-200 g carbohydrates  130-135 g protein  46-49 g fat  Progress Towards Goal(s):  Some progress.   Nutritional Diagnosis:  [place]-2.1 Inpaired nutrition utilization As related to glucose.  As evidenced by diagnosis of type 2 diabetes with elevated A1C at 10.0% or greater, need for insulin for glucose control.    Intervention:  Nutrition/Diabetes  lan to have a protein at all meals and snacks. (meat, poultry, fish, shellfish, nuts, cheese.  Continue to keep your meals regular and try to have about the same amount of food each meal each day.  Get back to taking your Levemir insulin at night until your next surgery.  To get an idea about your A1C your can add up all your numbers/glucose readings and then divide by the number.  Use the graph of the A1C to guess about the level of your A1C  Keep up the water.  Monitoring/Evaluation:  Dietary intake, exercise, blood glucose levels, and body weight in 8 weeks.

## 2012-08-25 ENCOUNTER — Other Ambulatory Visit: Payer: Self-pay | Admitting: *Deleted

## 2012-08-25 MED ORDER — INSULIN PEN NEEDLE 32G X 4 MM MISC: Status: DC

## 2012-08-27 ENCOUNTER — Ambulatory Visit (INDEPENDENT_AMBULATORY_CARE_PROVIDER_SITE_OTHER): Payer: Managed Care, Other (non HMO) | Admitting: Internal Medicine

## 2012-08-27 ENCOUNTER — Encounter: Payer: Self-pay | Admitting: Internal Medicine

## 2012-08-27 ENCOUNTER — Ambulatory Visit: Payer: Managed Care, Other (non HMO) | Admitting: Internal Medicine

## 2012-08-27 VITALS — BP 120/64 | HR 79 | Temp 97.6°F | Resp 10 | Wt 210.0 lb

## 2012-08-27 DIAGNOSIS — E1149 Type 2 diabetes mellitus with other diabetic neurological complication: Secondary | ICD-10-CM

## 2012-08-27 NOTE — Progress Notes (Signed)
Subjective:     Patient ID: Mike Orozco, male   DOB: 10/10/1961, 52 y.o.   MRN: [id number]  HPI Mr. Shin is a pleasant 52 year old man, returning for f/u for DM2, uncontrolled, insulin-dependent, with complications (chronic kidney disease - GFR 40, peripheral neuropathy, systolic CHF). He is here with his wife.  [name] has had diabetes for 2 months. His last hemoglobin A1c was 9.4% 2 months ago. He is now on a regimen of: - Levemir 32 units at bedtime - Humalog 02-21-09  He checks his sugars 6-7 times a day, before and after every meal and at bedtime. He sent me his sugars through my chart and we have make insulin adjustments since last visit. He brings his sugar log with him, that also includes his diet log.  - Before breakfast: 98-148, with most sugars between 110 and 120 - 2 hours after breakfast 125-149 - Before lunch 75-187 - 2 hours after lunch 107-201, these being his higher sugars - Before dinner 86-157, with 1 low at 63 (only had peanuts for lunch) and 1 high at 198 - 2 hours after dinner 100-150, with one high at 209 (after the 63 above, did not bolus) - bedtime 100-140  He denies lows, lowest 63 (see above). He saw nutrition 5 days ago and he mentions that this was very helpful.He has a kidney stone, and he has surgery scheduled for the end of the month to extract the stone. Due to this, he did not exercise recently (cardio 30 min at 5 pm).   His last BUN/creatinine was 50/2.2 on 07/07/2012 >> 16/1.5 on an istat from 08/18/2012. His last lipid panel showed a total cholesterol of 201, triglycerides of 908, HDL of 38. Last eye exam: no DR, last year. No numbness and tingling in legs.   The patient also has a history of anxiety, hypertension, hyperlipidemia, nonischemic cardiomyopathy, gout, anemia, kidney stones (sees urology), tobacco and alcohol abuse.   Review of Systems Constitutional: no weight gain/loss, no fatigue, no subjective hyperthermia/hypothermia Eyes: no blurry  vision, no xerophthalmia ENT: no sore throat, no nodules palpated in throat, no dysphagia/odynophagia, no hoarseness Cardiovascular: no CP/SOB/palpitations/leg swelling Respiratory: no cough/SOB Gastrointestinal: no N/V/D/C Musculoskeletal: no muscle/joint aches Skin: no rashes Neurological: no tremors/numbness/tingling/dizziness Psychiatric: no depression/anxiety  I reviewed pt's medications, allergies, PMH, social hx, family hx and no changes required.    Objective:   Physical Exam BP 120/64  Pulse 79  Temp(Src) 97.6 F (36.4 C) (Oral)  Resp 10  Wt 210 lb (95.255 kg)  BMI 33.91 kg/m2  SpO2 97% Wt Readings from Last 3 Encounters:  08/27/12 210 lb (95.255 kg)  08/24/12 206 lb 9.6 oz (93.713 kg)  08/18/12 205 lb (92.987 kg)  Constitutional: overweight, in NAD Eyes: PERRLA, EOMI, no exophthalmos ENT: moist mucous membranes, no thyromegaly, no cervical lymphadenopathy Cardiovascular: RRR, No MRG Respiratory: CTA B Gastrointestinal: abdomen soft, NT, ND, BS+ Musculoskeletal: no deformities, strength intact in all 4 Skin: moist, warm, no rashes Neurological: no tremor with outstretched hands, DTR normal in all 4  Assessment:     1. DM2, uncontrolled, insulin-dependent, with complications - chronic kidney disease, GFR 40 - sees Dr. Hyman Hopes - peripheral neuropathy - Chronic systolic CHF    Plan:     Patient with continuous improvement in his sugars, very conscientious in keeping a detailed log including meals, activity, before and 2 hours after meal CBGs. I congratulated the patient for this. He also sends me his sugars through My chart every week  and we have made adjustments in his insulin dosing since his last visit. - At this visit, we discussed about the fact that he would like to come off the insulin especially the mealtime Novolog, and I explained that we can do that when his sugars start to drop even on low doses of mealtime insulin. I do not think this is impossible, we  will readdress this after he improves his diet a little bit more and he is through with his surgery. - For now, since his highest sugars are 2 hours after lunch, I advised him to increase his lunchtime NovoLog  To 8 units. His new regimen will be: Levemir 32 units each bedtime NovoLog 02-23-09 - I advised him that his sugars can increase around the time of surgery - He will continue to send me his sugars every week  - given samples of NovoLog and Levemir pens and given a new sugar log    - I will see the patient back in 2 months with his sugar log, and we'll need to check a hemoglobin A1c at that time.

## 2012-08-27 NOTE — Patient Instructions (Addendum)
Please return in 2 months with your sugar log.  Increase lunchtime NovoLog to 8 units.

## 2012-08-30 ENCOUNTER — Other Ambulatory Visit: Payer: Self-pay | Admitting: *Deleted

## 2012-08-30 MED ORDER — ONETOUCH DELICA LANCETS 33G MISC: 1.0000 | Freq: Four times a day (QID) | Status: DC

## 2012-08-31 ENCOUNTER — Telehealth: Payer: Self-pay | Admitting: *Deleted

## 2012-08-31 ENCOUNTER — Encounter (HOSPITAL_COMMUNITY): Payer: Self-pay | Admitting: Pharmacy Technician

## 2012-08-31 NOTE — Telephone Encounter (Signed)
Patient called stating he has caught a stomach bug and is unable to keep anything on his stomach. He asked if he needs to continue to take his insulin. Please advise. Thank you.

## 2012-08-31 NOTE — Telephone Encounter (Signed)
Mike Orozco, please tell him to stay hydrated and check his sugars frequently. He should take at most half of his insulin doses if his sugars are higher.

## 2012-08-31 NOTE — Telephone Encounter (Signed)
Pt has virus, unable to keep food down. Should he take his insulin anyway?

## 2012-08-31 NOTE — Telephone Encounter (Signed)
Called pt and let him know that Dr Elvera Lennox advised to stay hydrated and check his sugars frequently. He stated at 9:00 am it was 152. She also advised to take at most half of his insulin doses if his sugars are high. Pt understood.

## 2012-09-02 ENCOUNTER — Encounter: Payer: Self-pay | Admitting: Internal Medicine

## 2012-09-04 ENCOUNTER — Other Ambulatory Visit: Payer: Self-pay | Admitting: Internal Medicine

## 2012-09-06 ENCOUNTER — Encounter (HOSPITAL_COMMUNITY)
Admission: RE | Admit: 2012-09-06 | Discharge: 2012-09-06 | Disposition: A | Discharge status: Home / Self Care | Payer: Managed Care, Other (non HMO) | Source: Ambulatory Visit | Attending: Urology | Admitting: Urology

## 2012-09-06 ENCOUNTER — Encounter (HOSPITAL_COMMUNITY): Payer: Self-pay

## 2012-09-06 ENCOUNTER — Encounter: Payer: Self-pay | Admitting: Internal Medicine

## 2012-09-06 ENCOUNTER — Telehealth: Payer: Self-pay | Admitting: Internal Medicine

## 2012-09-06 ENCOUNTER — Ambulatory Visit (INDEPENDENT_AMBULATORY_CARE_PROVIDER_SITE_OTHER): Payer: Managed Care, Other (non HMO) | Admitting: Internal Medicine

## 2012-09-06 VITALS — BP 120/86 | HR 63 | Temp 97.6°F | Resp 16 | Wt 207.0 lb

## 2012-09-06 DIAGNOSIS — E781 Pure hyperglyceridemia: Secondary | ICD-10-CM

## 2012-09-06 DIAGNOSIS — N189 Chronic kidney disease, unspecified: Secondary | ICD-10-CM

## 2012-09-06 DIAGNOSIS — E1149 Type 2 diabetes mellitus with other diabetic neurological complication: Secondary | ICD-10-CM

## 2012-09-06 DIAGNOSIS — I10 Essential (primary) hypertension: Secondary | ICD-10-CM

## 2012-09-06 HISTORY — DX: Gastro-esophageal reflux disease without esophagitis: K21.9

## 2012-09-06 HISTORY — DX: Unspecified osteoarthritis, unspecified site: M19.90

## 2012-09-06 HISTORY — DX: Heart failure, unspecified: I50.9

## 2012-09-06 LAB — BASIC METABOLIC PANEL
BUN: 25 mg/dL — ABNORMAL HIGH (ref 6–23)
CO2: 28 mEq/L (ref 19–32)
Chloride: 105 mEq/L (ref 96–112)
GFR calc Af Amer: 62 mL/min — ABNORMAL LOW (ref >90)
Potassium: 4.4 mEq/L (ref 3.5–5.1)

## 2012-09-06 LAB — CBC
HCT: 33 % — ABNORMAL LOW (ref 39.0–52.0)
Hemoglobin: 10.4 g/dL — ABNORMAL LOW (ref 13.0–17.0)
WBC: 5.5 10*3/uL (ref 4.0–10.5)

## 2012-09-06 LAB — HEMOGLOBIN A1C: Mean Plasma Glucose: 111 mg/dL (ref <117)

## 2012-09-06 LAB — SURGICAL PCR SCREEN
MRSA, PCR: NEGATIVE
Staphylococcus aureus: NEGATIVE

## 2012-09-06 MED ORDER — OMEGA-3-ACID ETHYL ESTERS 1 G PO CAPS: 2.0000 g | ORAL_CAPSULE | Freq: Two times a day (BID) | ORAL | Status: DC

## 2012-09-06 NOTE — Progress Notes (Signed)
Chest x-ray 03/15/12 on [facility]

## 2012-09-06 NOTE — Assessment & Plan Note (Signed)
No labs were done today at his request

## 2012-09-06 NOTE — Patient Instructions (Addendum)
20 Mike Orozco  09/06/2012   Your procedure is scheduled on: 09/15/12   Report to [facility] at 0930 AM.  Call this number if you have problems the morning of surgery 336-: [phone]   Remember:   Do not eat food or drink liquids After Midnight.     Take these medicines the morning of surgery with A SIP OF WATER: carvedilol, prilosec   Do not wear jewelry, make-up or nail polish.  Do not wear lotions, powders, or perfumes. You may wear deodorant.  Do not shave 48 hours prior to surgery. Men may shave face and neck.  Do not bring valuables to the hospital.  Contacts, dentures or bridgework may not be worn into surgery.  Leave suitcase in the car. After surgery it may be brought to your room.  For patients admitted to the hospital, checkout time is 11:00 AM the day of discharge.    Please read over the following fact sheets that you were given: MRSA Information.  Birdie Sons, RN  pre op nurse call if needed [phone]    FAILURE TO FOLLOW THESE INSTRUCTIONS MAY RESULT IN CANCELLATION OF YOUR SURGERY   Patient Signature: ___________________________________________

## 2012-09-06 NOTE — Assessment & Plan Note (Signed)
His BP is well controlled 

## 2012-09-06 NOTE — Progress Notes (Signed)
Subjective:    Patient ID: Mike Orozco, male    DOB: 10/10/1961, 52 y.o.   MRN: [id number]  Diabetes He presents for his follow-up diabetic visit. He has type 2 diabetes mellitus. His disease course has been improving. There are no hypoglycemic associated symptoms. Pertinent negatives for hypoglycemia include no dizziness, pallor or tremors. Pertinent negatives for diabetes include no chest pain, no fatigue, no foot paresthesias, no foot ulcerations, no polydipsia, no polyphagia, no polyuria, no visual change, no weakness and no weight loss. There are no hypoglycemic complications. Symptoms are stable. Diabetic complications include nephropathy. Current diabetic treatment includes intensive insulin program and insulin injections. He is compliant with treatment most of the time. His weight is stable. He is following a generally healthy diet. Meal planning includes avoidance of concentrated sweets. He has had a previous visit with a dietician. He participates in exercise intermittently. His home blood glucose trend is decreasing steadily. An ACE inhibitor/angiotensin II receptor blocker is not being taken. He does not see a podiatrist.Eye exam is not current.      Review of Systems  Constitutional: Negative.  Negative for weight loss and fatigue.  HENT: Negative.   Eyes: Negative.   Respiratory: Negative.  Negative for cough, chest tightness, shortness of breath, wheezing and stridor.   Cardiovascular: Negative.  Negative for chest pain, palpitations and leg swelling.  Gastrointestinal: Negative.  Negative for nausea, vomiting, abdominal pain, diarrhea, constipation and blood in stool.  Endocrine: Negative.  Negative for polydipsia, polyphagia and polyuria.  Genitourinary: Negative.   Musculoskeletal: Negative.  Negative for myalgias, back pain, joint swelling, arthralgias and gait problem.  Skin: Negative.  Negative for color change, pallor, rash and wound.  Allergic/Immunologic: Negative.    Neurological: Negative.  Negative for dizziness, tremors, weakness and light-headedness.  Hematological: Negative.  Negative for adenopathy. Does not bruise/bleed easily.  Psychiatric/Behavioral: Negative.        Objective:   Physical Exam  Vitals reviewed. Constitutional: He is oriented to person, place, and time. He appears well-developed and well-nourished. No distress.  HENT:  Head: Normocephalic and atraumatic.  Mouth/Throat: Oropharynx is clear and moist. No oropharyngeal exudate.  Eyes: Conjunctivae are normal. Right eye exhibits no discharge. Left eye exhibits no discharge. No scleral icterus.  Neck: Normal range of motion. Neck supple. No JVD present. No tracheal deviation present. No thyromegaly present.  Cardiovascular: Normal rate, regular rhythm, normal heart sounds and intact distal pulses.  Exam reveals no gallop and no friction rub.   No murmur heard. Pulmonary/Chest: Effort normal and breath sounds normal. No stridor. No respiratory distress. He has no wheezes. He has no rales. He exhibits no tenderness.  Abdominal: Soft. Bowel sounds are normal. He exhibits no distension and no mass. There is no tenderness. There is no rebound and no guarding.  Musculoskeletal: Normal range of motion. He exhibits no edema and no tenderness.  Lymphadenopathy:    He has no cervical adenopathy.  Neurological: He is oriented to person, place, and time.  Skin: Skin is warm and dry. No rash noted. He is not diaphoretic. No erythema. No pallor.  Psychiatric: He has a normal mood and affect. His behavior is normal. Judgment and thought content normal.     Lab Results  Component Value Date   WBC 4.5 07/07/2012   HGB 11.2* 08/18/2012   HCT 33.0* 08/18/2012   PLT 233.0 07/07/2012   GLUCOSE 110* 08/18/2012   CHOL 201* 07/07/2012   TRIG 908.0 Triglyceride is over 400; calculations on Lipids  are invalid.* 07/07/2012   HDL 38.20* 07/07/2012   LDLDIRECT 38.6 07/07/2012   LDLCALC 83 05/10/2012   ALT 29  07/07/2012   AST 24 07/07/2012   NA 143 08/18/2012   K 3.7 08/18/2012   CL 109 08/18/2012   CREATININE 1.50* 08/18/2012   BUN 16 08/18/2012   CO2 25 07/07/2012   TSH 1.08 02/23/2012   PSA 0.91 06/17/2010   INR 1.11 02/05/2011   HGBA1C 9.4* 07/07/2012       Assessment & Plan:

## 2012-09-06 NOTE — Assessment & Plan Note (Signed)
His trigs are way too high so I have asked him to add lovaza to his current regimen No labs were done today at this request

## 2012-09-06 NOTE — Progress Notes (Signed)
09/06/12 1147  OBSTRUCTIVE SLEEP APNEA  Have you ever been diagnosed with sleep apnea through a sleep study? No  Do you snore loudly (loud enough to be heard through closed doors)?  1  Do you often feel tired, fatigued, or sleepy during the daytime? 0  Has anyone observed you stop breathing during your sleep? 0  Do you have, or are you being treated for high blood pressure? 1  BMI more than 35 kg/m2? 0  Age over 53 years old? 1  Neck circumference greater than 40 cm/18 inches? 0  Gender: 1  Obstructive Sleep Apnea Score 4  Score 4 or greater  Results sent to PCP

## 2012-09-06 NOTE — Progress Notes (Addendum)
Hemoglobin A1C ordered per pt request. Pt stated "do not want to get 2 blood draws in one day." [phone] for questions

## 2012-09-06 NOTE — Telephone Encounter (Signed)
Patient wanted to let Dr. Jonny Ruiz know that he just had blood work done at [facility].  . . . He can get results for AC1 there. Please contact patient.  Patient is requesting you leave a voice mail.

## 2012-09-06 NOTE — Patient Instructions (Addendum)

## 2012-09-07 ENCOUNTER — Ambulatory Visit (INDEPENDENT_AMBULATORY_CARE_PROVIDER_SITE_OTHER): Payer: Managed Care, Other (non HMO) | Admitting: Cardiology

## 2012-09-07 ENCOUNTER — Telehealth: Payer: Self-pay | Admitting: *Deleted

## 2012-09-07 ENCOUNTER — Encounter: Payer: Self-pay | Admitting: Cardiology

## 2012-09-07 ENCOUNTER — Telehealth: Payer: Self-pay | Admitting: Cardiology

## 2012-09-07 VITALS — BP 124/86 | HR 68 | Ht 66.0 in | Wt 210.0 lb

## 2012-09-07 DIAGNOSIS — I10 Essential (primary) hypertension: Secondary | ICD-10-CM

## 2012-09-07 DIAGNOSIS — I428 Other cardiomyopathies: Secondary | ICD-10-CM

## 2012-09-07 DIAGNOSIS — Z01810 Encounter for preprocedural cardiovascular examination: Secondary | ICD-10-CM | POA: Insufficient documentation

## 2012-09-07 NOTE — Progress Notes (Signed)
HPI: Pleasant male for fu of nonischemic cardiomyopathy with EF initially at 15-20%. Cardiac catheterization in 03/2010 revealed normal coronary arteries. Last echocardiogram in May 2013 showed improved LV function with an ejection fraction of 50% and grade 1 diastolic dysfunction. He was last seen in April of 2013. Since then, the patient denies any dyspnea on exertion, orthopnea, PND, pedal edema, palpitations, syncope or chest pain.   Current Outpatient Prescriptions  Medication Sig Dispense Refill  . allopurinol (ZYLOPRIM) 300 MG tablet Take 300 mg by mouth every morning.       . carvedilol (COREG) 25 MG tablet Take 25 mg by mouth 2 (two) times daily with a meal.      . Fenofibric Acid 105 MG TABS Take 1 tablet by mouth every morning.      . ferrous sulfate 325 (65 FE) MG tablet Take 325 mg by mouth daily with breakfast.      . insulin aspart (NOVOLOG FLEXPEN) 100 UNIT/ML injection Inject 8-10 Units into the skin 3 (three) times daily before meals. 10 units breakfast, 8 units lunch, 10 units dinner      . insulin detemir (LEVEMIR FLEXPEN) 100 UNIT/ML injection Inject 32 Units into the skin at bedtime.      . Multiple Vitamin (MULTIVITAMIN) capsule Take 1 capsule by mouth daily.      Marland Kitchen omega-3 acid ethyl esters (LOVAZA) 1 G capsule Take 2 capsules (2 g total) by mouth 2 (two) times daily.  120 capsule  11  . pravastatin (PRAVACHOL) 40 MG tablet Take one tablet daily      . spironolactone (ALDACTONE) 25 MG tablet Take 25 mg by mouth every morning.        No current facility-administered medications for this visit.     Past Medical History  Diagnosis Date  . Heart failure, chronic systolic   . Non-ischemic cardiomyopathy SECONDARY TO HX UNCONTROLLED HTN AND ETOH ABUSE----  LAST ECHO EF 50%  09-22-2011    a) Echo 03/25/2010: EF 20-25%; mild AI; Mod MR; Mild LAE  b)cardiac cath 03/26/2010: normal cors; severe pulmonary HTN;PCWP 41; EF 10-15%;  c. echo 4/12:  EF 45-50%, mild LVH, grade 1  diast dysfxn, mild AI, mild LAE  . History of ETOH abuse   . Gout, unspecified     stable  per pt  . Pure hyperglyceridemia   . Anemia   . Personal history of colonic polyps-adenoma 02/17/2012    01/2012 - diminutive adenoma Repeat colon about 01/2017    . Heart block AV first degree   . Hypertension     CARDIOLOGIST-  DR Jens Som--- LOV IN [facility] 09-08-2011  . Chronic renal insufficiency baseline CRE  1.5--1.8    NEPHROLOGIST--  DR WEBB ([facility])  . Nephrolithiasis     RIGHT  . Polycystic kidney disease     BILATERAL RENAL CYST  . Diabetes mellitus type 2, insulin dependent PER pt and LAST PCP NOTE CBG'S IN 100'    RECENTLY UNCONTROLLED AIC 07-07-2012  9.4 AND CBG'S 400'S  . GERD (gastroesophageal reflux disease)   . Arthritis   . CHF (congestive heart failure)     Past Surgical History  Procedure Laterality Date  . Lumbar fusion  08-10-2003    L5  --  S1  . Removal cyst left lung  1994   AT [facility]    benign  . Knee arthroscopy w/ meniscectomy Left 02-06-2011    AND CHONDROPLASTY  . Cardiac catheterization  03-26-2010  DR Swaziland  SEVERE LV DYSFUNCTION WITH MARKEDLY ELEVATED FILLING PRESSURE/ SEVERE PULMONARY HYPERTENSION/ NORMAL CORONARY ANATOMY  . Transthoracic echocardiogram  09-22-2011  DR CRENSHAW    EF 50%/  MILD LVH/ GRADE I DIASTOLIC DYSFUNCTION/ MILD TR  . Rhinoplasty  1970'S    nose fx  . Cystoscopy with retrograde pyelogram, ureteroscopy and stent placement Right 08/18/2012    Procedure: CYSTOSCOPY WITH RETROGRADE PYELOGRAM, URETEROSCOPY AND STENT PLACEMENT;  Surgeon: Milford Cage, MD;  Location: [facility];  Service: Urology;  Laterality: Right;  . Colonoscopy      History   Social History  . Marital Status: Married    Spouse Name: N/A    Number of Children: 1  . Years of Education: N/A   Occupational History  . 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 Smoking status: Former Smoker -- 0.50 packs/day  for 32 years    Types: Cigarettes    Quit date: 02/27/2007  . Smokeless tobacco: Never Used  . Alcohol Use: No     Comment: HX ALCOHOL ABUSE--  STATES  Quit 02/2010  . Drug Use: No  . Sexually Active: Not on file   Other Topics Concern  . Not on file   Social History Narrative   No regular exercise   Daily caffeine          ROS: no fevers or chills, productive cough, hemoptysis, dysphasia, odynophagia, melena, hematochezia, dysuria, hematuria, rash, seizure activity, orthopnea, PND, pedal edema, claudication. Remaining systems are negative.  Physical Exam: Well-developed well-nourished in no acute distress.  Skin is warm and dry.  HEENT is normal.  Neck is supple.  Chest is clear to auscultation with normal expansion.  Cardiovascular exam is regular rate and rhythm.  Abdominal exam nontender or distended. No masses palpated. Extremities show no edema. neuro grossly intact  ECG 09/07/2011-sinus bradycardia, first degree AV block, left ventricular hypertrophy.

## 2012-09-07 NOTE — Telephone Encounter (Signed)
New problem   Selita/[facility] calling on an update for the cardiac clearance that was faxed on 08/19/12 and then re-faxed on 08/30/12, pt's surgery is 09/15/12. They haven't gotten a response back. Please call Selita.

## 2012-09-07 NOTE — Addendum Note (Signed)
Addended by: Lewayne Bunting on: 09/07/2012 09:30 AM   Modules accepted: Level of Service

## 2012-09-07 NOTE — Telephone Encounter (Signed)
Pt called stating that he had pre-op labs done at [facility] yesterday. He wanted to Dr Elvera Lennox to know that they did a HgbA1C. Also, can we call him with the results when they are final? ([phone]), Please advise.

## 2012-09-07 NOTE — Patient Instructions (Addendum)
Your physician wants you to follow-up in: ONE YEAR WITH DR CRENSHAW You will receive a reminder letter in the mail two months in advance. If you don't receive a letter, please call our office to schedule the follow-up appointment.  

## 2012-09-07 NOTE — Assessment & Plan Note (Signed)
Ejection fraction now 50%. Continue beta blocker.ACE inhibitor previously discontinued because of symptomatic hypotension by his report. I do not think we need to resume this given improvement in his LV function.

## 2012-09-07 NOTE — Assessment & Plan Note (Signed)
Blood pressure controlled with present medications. 

## 2012-09-07 NOTE — Assessment & Plan Note (Signed)
Patient is scheduled to have his kidney stone removed surgically. I do not think further preoperative cardiac evaluation is indicated as he has no symptoms. He may proceed with his surgery.

## 2012-09-07 NOTE — Telephone Encounter (Signed)
Lab released to MyChart:  Dear Mike Orozco, EXCELLENT result of the HbA1C! Now it is 5.5% again, previously 9.4%. Congratulations! Sincerely, Carlus Pavlov MD

## 2012-09-07 NOTE — Telephone Encounter (Signed)
Spoke with Mike Orozco, aware pt seen today. Office note containing clearance faxed to dr Margarita Grizzle

## 2012-09-08 ENCOUNTER — Telehealth: Payer: Self-pay | Admitting: *Deleted

## 2012-09-08 NOTE — Telephone Encounter (Signed)
Called pt and LVM with his lab results. Told him his HgbA1C is 5.5. It was previously 9.4. Congratulated him for Dr Elvera Lennox. Advised him if he has any further questions to call back.

## 2012-09-09 ENCOUNTER — Encounter: Payer: Self-pay | Admitting: Internal Medicine

## 2012-09-10 ENCOUNTER — Other Ambulatory Visit: Payer: Self-pay | Admitting: Radiology

## 2012-09-10 ENCOUNTER — Encounter (HOSPITAL_COMMUNITY): Payer: Self-pay | Admitting: Pharmacy Technician

## 2012-09-10 ENCOUNTER — Telehealth: Payer: Self-pay

## 2012-09-10 NOTE — Telephone Encounter (Signed)
Patient called Mike Orozco requesting to know if he is to take lavaza in combination with pravastatin. Per pt ok to LVM or sent mychart messege.

## 2012-09-15 ENCOUNTER — Encounter (HOSPITAL_COMMUNITY): Payer: Self-pay

## 2012-09-15 ENCOUNTER — Ambulatory Visit (HOSPITAL_COMMUNITY): Payer: Managed Care, Other (non HMO) | Admitting: Anesthesiology

## 2012-09-15 ENCOUNTER — Ambulatory Visit (HOSPITAL_COMMUNITY)
Admission: RE | Admit: 2012-09-15 | Discharge: 2012-09-15 | Disposition: A | Discharge status: Home / Self Care | Payer: Managed Care, Other (non HMO) | Source: Ambulatory Visit | Attending: Urology | Admitting: Urology

## 2012-09-15 ENCOUNTER — Encounter (HOSPITAL_COMMUNITY)
Admission: RE | Disposition: A | Discharge status: Home / Self Care | Payer: Self-pay | Source: Ambulatory Visit | Attending: Urology

## 2012-09-15 ENCOUNTER — Encounter (HOSPITAL_COMMUNITY): Payer: Self-pay | Admitting: General Practice

## 2012-09-15 ENCOUNTER — Encounter (HOSPITAL_COMMUNITY): Payer: Self-pay | Admitting: Anesthesiology

## 2012-09-15 ENCOUNTER — Ambulatory Visit (HOSPITAL_COMMUNITY): Payer: Managed Care, Other (non HMO)

## 2012-09-15 ENCOUNTER — Observation Stay (HOSPITAL_COMMUNITY)
Admission: RE | Admit: 2012-09-15 | Discharge: 2012-09-17 | Disposition: A | Discharge status: Home / Self Care | Payer: Managed Care, Other (non HMO) | Source: Ambulatory Visit | Attending: Urology | Admitting: Urology

## 2012-09-15 VITALS — BP 130/90 | HR 58 | Temp 97.5°F | Resp 16 | Ht 66.0 in | Wt 210.0 lb

## 2012-09-15 DIAGNOSIS — I2789 Other specified pulmonary heart diseases: Secondary | ICD-10-CM | POA: Insufficient documentation

## 2012-09-15 DIAGNOSIS — K5900 Constipation, unspecified: Secondary | ICD-10-CM | POA: Insufficient documentation

## 2012-09-15 DIAGNOSIS — N200 Calculus of kidney: Principal | ICD-10-CM | POA: Diagnosis present

## 2012-09-15 DIAGNOSIS — R109 Unspecified abdominal pain: Secondary | ICD-10-CM | POA: Insufficient documentation

## 2012-09-15 DIAGNOSIS — D62 Acute posthemorrhagic anemia: Secondary | ICD-10-CM | POA: Diagnosis not present

## 2012-09-15 DIAGNOSIS — N189 Chronic kidney disease, unspecified: Secondary | ICD-10-CM | POA: Insufficient documentation

## 2012-09-15 DIAGNOSIS — E119 Type 2 diabetes mellitus without complications: Secondary | ICD-10-CM | POA: Insufficient documentation

## 2012-09-15 DIAGNOSIS — I2510 Atherosclerotic heart disease of native coronary artery without angina pectoris: Secondary | ICD-10-CM | POA: Insufficient documentation

## 2012-09-15 DIAGNOSIS — I428 Other cardiomyopathies: Secondary | ICD-10-CM | POA: Insufficient documentation

## 2012-09-15 DIAGNOSIS — D638 Anemia in other chronic diseases classified elsewhere: Secondary | ICD-10-CM | POA: Diagnosis present

## 2012-09-15 DIAGNOSIS — E785 Hyperlipidemia, unspecified: Secondary | ICD-10-CM | POA: Insufficient documentation

## 2012-09-15 DIAGNOSIS — I129 Hypertensive chronic kidney disease with stage 1 through stage 4 chronic kidney disease, or unspecified chronic kidney disease: Secondary | ICD-10-CM | POA: Insufficient documentation

## 2012-09-15 HISTORY — PX: NEPHROLITHOTOMY: SHX5134

## 2012-09-15 LAB — BASIC METABOLIC PANEL
Chloride: 102 mEq/L (ref 96–112)
GFR calc Af Amer: 72 mL/min — ABNORMAL LOW (ref >90)
Potassium: 4 mEq/L (ref 3.5–5.1)

## 2012-09-15 LAB — PROTIME-INR: INR: 1.01 (ref 0.00–1.49)

## 2012-09-15 LAB — GLUCOSE, CAPILLARY
Glucose-Capillary: 122 mg/dL — ABNORMAL HIGH (ref 70–99)
Glucose-Capillary: 115 mg/dL — ABNORMAL HIGH (ref 70–99)

## 2012-09-15 LAB — HEMOGLOBIN AND HEMATOCRIT, BLOOD: Hemoglobin: 9.2 g/dL — ABNORMAL LOW (ref 13.0–17.0)

## 2012-09-15 LAB — TYPE AND SCREEN: Antibody Screen: NEGATIVE

## 2012-09-15 LAB — CBC
HCT: 32.6 % — ABNORMAL LOW (ref 39.0–52.0)
Hemoglobin: 10.1 g/dL — ABNORMAL LOW (ref 13.0–17.0)
RBC: 3.69 MIL/uL — ABNORMAL LOW (ref 4.22–5.81)
WBC: 7.3 10*3/uL (ref 4.0–10.5)

## 2012-09-15 LAB — APTT: aPTT: 30 seconds (ref 24–37)

## 2012-09-15 SURGERY — NEPHROLITHOTOMY PERCUTANEOUS
Anesthesia: General | Laterality: Right | Wound class: Clean Contaminated

## 2012-09-15 MED ORDER — MORPHINE SULFATE 2 MG/ML IJ SOLN
2.0000 mg | INTRAMUSCULAR | Status: DC | PRN
  Administered 2012-09-15 – 2012-09-16 (×2): 4 mg via INTRAVENOUS
  Administered 2012-09-16 (×2): 2 mg via INTRAVENOUS
  Administered 2012-09-16 (×3): 4 mg via INTRAVENOUS
  Filled 2012-09-15 (×5): qty 2
  Filled 2012-09-15 (×2): qty 1

## 2012-09-15 MED ORDER — ACETAMINOPHEN 10 MG/ML IV SOLN
INTRAVENOUS | Status: AC
  Filled 2012-09-15: qty 100

## 2012-09-15 MED ORDER — MIDAZOLAM HCL 2 MG/2ML IJ SOLN
INTRAMUSCULAR | Status: AC
  Filled 2012-09-15: qty 6

## 2012-09-15 MED ORDER — ACETAMINOPHEN 10 MG/ML IV SOLN
1000.0000 mg | Freq: Four times a day (QID) | INTRAVENOUS | Status: AC
  Administered 2012-09-15 – 2012-09-16 (×4): 1000 mg via INTRAVENOUS
  Filled 2012-09-15 (×4): qty 100

## 2012-09-15 MED ORDER — SODIUM CHLORIDE 0.9 % IR SOLN
Status: DC | PRN
  Administered 2012-09-15: 6000 mL via INTRAVESICAL

## 2012-09-15 MED ORDER — CIPROFLOXACIN IN D5W 400 MG/200ML IV SOLN
400.0000 mg | Freq: Once | INTRAVENOUS | Status: AC
  Administered 2012-09-15: 400 mg via INTRAVENOUS
  Filled 2012-09-15: qty 200

## 2012-09-15 MED ORDER — OXYCODONE HCL 5 MG PO TABS
5.0000 mg | ORAL_TABLET | ORAL | Status: DC | PRN
  Administered 2012-09-16 – 2012-09-17 (×3): 10 mg via ORAL
  Filled 2012-09-15 (×3): qty 2

## 2012-09-15 MED ORDER — SIMVASTATIN 20 MG PO TABS
20.0000 mg | ORAL_TABLET | Freq: Every day | ORAL | Status: DC
  Administered 2012-09-16: 20 mg via ORAL
  Filled 2012-09-15 (×3): qty 1

## 2012-09-15 MED ORDER — SODIUM CHLORIDE 0.9 % IV SOLN
Freq: Once | INTRAVENOUS | Status: AC
  Administered 2012-09-15: 20 mL/h via INTRAVENOUS

## 2012-09-15 MED ORDER — INSULIN ASPART 100 UNIT/ML ~~LOC~~ SOLN
0.0000 [IU] | Freq: Three times a day (TID) | SUBCUTANEOUS | Status: DC
  Administered 2012-09-16: 2 [IU] via SUBCUTANEOUS
  Administered 2012-09-16: 3 [IU] via SUBCUTANEOUS
  Administered 2012-09-17: 2 [IU] via SUBCUTANEOUS

## 2012-09-15 MED ORDER — IOHEXOL 300 MG/ML  SOLN
10.0000 mL | Freq: Once | INTRAMUSCULAR | Status: AC | PRN
  Administered 2012-09-15: 1 mL

## 2012-09-15 MED ORDER — GENTAMICIN IN SALINE 1.6-0.9 MG/ML-% IV SOLN
80.0000 mg | INTRAVENOUS | Status: AC
  Administered 2012-09-15: 80 mg via INTRAVENOUS
  Filled 2012-09-15: qty 50

## 2012-09-15 MED ORDER — SPIRONOLACTONE 25 MG PO TABS
25.0000 mg | ORAL_TABLET | Freq: Every morning | ORAL | Status: DC
  Administered 2012-09-16 – 2012-09-17 (×2): 25 mg via ORAL
  Filled 2012-09-15 (×2): qty 1

## 2012-09-15 MED ORDER — SODIUM CHLORIDE 0.9 % IV SOLN
INTRAVENOUS | Status: DC | PRN
  Administered 2012-09-15: 13:00:00 via INTRAVENOUS

## 2012-09-15 MED ORDER — SODIUM CHLORIDE 0.9 % IV SOLN
2.0000 g | INTRAVENOUS | Status: AC
  Administered 2012-09-15: 2 g via INTRAVENOUS
  Filled 2012-09-15: qty 2000

## 2012-09-15 MED ORDER — IOHEXOL 300 MG/ML  SOLN
INTRAMUSCULAR | Status: DC | PRN
  Administered 2012-09-15: 30 mL

## 2012-09-15 MED ORDER — ACETAMINOPHEN 10 MG/ML IV SOLN
INTRAVENOUS | Status: DC | PRN
  Administered 2012-09-15: 1000 mg via INTRAVENOUS

## 2012-09-15 MED ORDER — FENOFIBRIC ACID 105 MG PO TABS: 108.0000 mg | ORAL_TABLET | Freq: Every morning | ORAL | Status: DC

## 2012-09-15 MED ORDER — CARVEDILOL 25 MG PO TABS
25.0000 mg | ORAL_TABLET | Freq: Two times a day (BID) | ORAL | Status: DC
  Administered 2012-09-16 – 2012-09-17 (×3): 25 mg via ORAL
  Filled 2012-09-15 (×6): qty 1

## 2012-09-15 MED ORDER — FENTANYL CITRATE 0.05 MG/ML IJ SOLN
INTRAMUSCULAR | Status: AC
  Filled 2012-09-15: qty 6

## 2012-09-15 MED ORDER — FERROUS SULFATE 325 (65 FE) MG PO TABS
325.0000 mg | ORAL_TABLET | Freq: Every day | ORAL | Status: DC
  Administered 2012-09-16 – 2012-09-17 (×2): 325 mg via ORAL
  Filled 2012-09-15 (×4): qty 1

## 2012-09-15 MED ORDER — GENTAMICIN SULFATE 40 MG/ML IJ SOLN
380.0000 mg | INTRAVENOUS | Status: AC
  Administered 2012-09-15: 380 mg via INTRAVENOUS
  Filled 2012-09-15: qty 9.5

## 2012-09-15 MED ORDER — FENTANYL CITRATE 0.05 MG/ML IJ SOLN
INTRAMUSCULAR | Status: DC | PRN
  Administered 2012-09-15: 50 ug via INTRAVENOUS
  Administered 2012-09-15 (×4): 25 ug via INTRAVENOUS

## 2012-09-15 MED ORDER — ONDANSETRON HCL 4 MG/2ML IJ SOLN
INTRAMUSCULAR | Status: DC | PRN
  Administered 2012-09-15 (×2): 2 mg via INTRAVENOUS

## 2012-09-15 MED ORDER — LIDOCAINE HCL 1 % IJ SOLN
INTRAMUSCULAR | Status: AC
  Filled 2012-09-15: qty 20

## 2012-09-15 MED ORDER — SODIUM CHLORIDE 0.9 % IV SOLN
1.0000 g | Freq: Four times a day (QID) | INTRAVENOUS | Status: AC
  Administered 2012-09-15 – 2012-09-16 (×3): 1 g via INTRAVENOUS
  Filled 2012-09-15 (×3): qty 1000

## 2012-09-15 MED ORDER — FENTANYL CITRATE 0.05 MG/ML IJ SOLN
INTRAMUSCULAR | Status: AC | PRN
  Administered 2012-09-15: 100 ug via INTRAVENOUS

## 2012-09-15 MED ORDER — CISATRACURIUM BESYLATE (PF) 10 MG/5ML IV SOLN
INTRAVENOUS | Status: DC | PRN
  Administered 2012-09-15: 6 mg via INTRAVENOUS
  Administered 2012-09-15: 4 mg via INTRAVENOUS

## 2012-09-15 MED ORDER — SENNOSIDES-DOCUSATE SODIUM 8.6-50 MG PO TABS
1.0000 | ORAL_TABLET | Freq: Two times a day (BID) | ORAL | Status: DC
  Administered 2012-09-15 – 2012-09-17 (×4): 1 via ORAL
  Filled 2012-09-15 (×5): qty 1

## 2012-09-15 MED ORDER — PROPOFOL 10 MG/ML IV EMUL
INTRAVENOUS | Status: DC | PRN
  Administered 2012-09-15: 150 mg via INTRAVENOUS

## 2012-09-15 MED ORDER — INSULIN ASPART 100 UNIT/ML ~~LOC~~ SOLN: 0.0000 [IU] | Freq: Every day | SUBCUTANEOUS | Status: DC

## 2012-09-15 MED ORDER — SODIUM CHLORIDE 0.9 % IV SOLN
INTRAVENOUS | Status: DC
  Administered 2012-09-15 – 2012-09-16 (×4): via INTRAVENOUS

## 2012-09-15 MED ORDER — ONDANSETRON HCL 4 MG/2ML IJ SOLN: 4.0000 mg | INTRAMUSCULAR | Status: DC | PRN

## 2012-09-15 MED ORDER — LACTATED RINGERS IV SOLN
INTRAVENOUS | Status: DC | PRN
  Administered 2012-09-15: 12:00:00 via INTRAVENOUS

## 2012-09-15 MED ORDER — LIDOCAINE HCL (CARDIAC) 20 MG/ML IV SOLN
INTRAVENOUS | Status: DC | PRN
  Administered 2012-09-15: 30 mg via INTRAVENOUS

## 2012-09-15 MED ORDER — MIDAZOLAM HCL 2 MG/2ML IJ SOLN
INTRAMUSCULAR | Status: AC | PRN
  Administered 2012-09-15: 1 mg via INTRAVENOUS
  Administered 2012-09-15: 2 mg via INTRAVENOUS

## 2012-09-15 MED ORDER — PROMETHAZINE HCL 25 MG/ML IJ SOLN: 6.2500 mg | INTRAMUSCULAR | Status: DC | PRN

## 2012-09-15 MED ORDER — ACETAMINOPHEN 325 MG PO TABS: 650.0000 mg | ORAL_TABLET | ORAL | Status: DC | PRN

## 2012-09-15 MED ORDER — MIDAZOLAM HCL 5 MG/5ML IJ SOLN
INTRAMUSCULAR | Status: DC | PRN
  Administered 2012-09-15: 1 mg via INTRAVENOUS

## 2012-09-15 MED ORDER — HYDROMORPHONE HCL PF 1 MG/ML IJ SOLN: 0.2500 mg | INTRAMUSCULAR | Status: DC | PRN

## 2012-09-15 MED ORDER — SUCCINYLCHOLINE CHLORIDE 20 MG/ML IJ SOLN
INTRAMUSCULAR | Status: DC | PRN
  Administered 2012-09-15: 160 mg via INTRAVENOUS

## 2012-09-15 SURGICAL SUPPLY — 56 items
APL SKNCLS STERI-STRIP NONHPOA (GAUZE/BANDAGES/DRESSINGS) ×2
BAG URINE DRAINAGE (UROLOGICAL SUPPLIES) ×2 IMPLANT
BAG URO CATCHER STRL LF (DRAPE) ×2 IMPLANT
BANDAGE GAUZE ELAST BULKY 4 IN (GAUZE/BANDAGES/DRESSINGS) ×2 IMPLANT
BASKET STONE NITINOL 3FRX115MB (UROLOGICAL SUPPLIES) ×1 IMPLANT
BENZOIN TINCTURE PRP APPL 2/3 (GAUZE/BANDAGES/DRESSINGS) ×4 IMPLANT
CATCHER STONE W/TUBE ADAPTER (UROLOGICAL SUPPLIES) ×1 IMPLANT
CATH COUNCIL 22FR (CATHETERS) ×1 IMPLANT
CATH DILATION NEPHR BALL 15X10 (CATHETERS) ×1 IMPLANT
CATH FOLEY 2W COUNCIL 20FR 5CC (CATHETERS) IMPLANT
CATH FOLEY 2WAY SLVR  5CC 18FR (CATHETERS) ×1
CATH FOLEY 2WAY SLVR 5CC 18FR (CATHETERS) ×1 IMPLANT
CATH ROBINSON RED A/P 20FR (CATHETERS) IMPLANT
CATH URET 5FR 28IN OPEN ENDED (CATHETERS) ×2 IMPLANT
CATH URET DUAL LUMEN 6-10FR 50 (CATHETERS) ×2 IMPLANT
CHLORAPREP W/TINT 26ML (MISCELLANEOUS) ×2 IMPLANT
CLOTH BEACON ORANGE TIMEOUT ST (SAFETY) ×2 IMPLANT
COVER SURGICAL LIGHT HANDLE (MISCELLANEOUS) ×2 IMPLANT
DEVICE INFLATION 20CC 30ATM (MISCELLANEOUS) ×1 IMPLANT
DRAPE C-ARM 42X72 X-RAY (DRAPES) ×2 IMPLANT
DRAPE CAMERA CLOSED 9X96 (DRAPES) ×2 IMPLANT
DRAPE LINGEMAN PERC (DRAPES) ×2 IMPLANT
DRAPE SURG IRRIG POUCH 19X23 (DRAPES) ×2 IMPLANT
DRAPE UTILITY XL STRL (DRAPES) ×2 IMPLANT
GLOVE BIOGEL M 7.0 STRL (GLOVE) IMPLANT
GLOVE BIOGEL PI IND STRL 7.5 (GLOVE) ×1 IMPLANT
GLOVE BIOGEL PI INDICATOR 7.5 (GLOVE) ×1
GLOVE ECLIPSE 7.0 STRL STRAW (GLOVE) ×2 IMPLANT
GLOVE SURG SS PI 8.0 STRL IVOR (GLOVE) ×2 IMPLANT
GOWN PREVENTION PLUS XLARGE (GOWN DISPOSABLE) ×2 IMPLANT
GOWN STRL REIN XL XLG (GOWN DISPOSABLE) ×2 IMPLANT
GUIDEWIRE AMPLAZ .035X145 (WIRE) ×2 IMPLANT
GUIDEWIRE STR DUAL SENSOR (WIRE) ×2 IMPLANT
KIT BASIN OR (CUSTOM PROCEDURE TRAY) ×2 IMPLANT
LASER FIBER DISP (UROLOGICAL SUPPLIES) IMPLANT
LASER FIBER DISP 1000U (UROLOGICAL SUPPLIES) IMPLANT
MANIFOLD NEPTUNE II (INSTRUMENTS) ×2 IMPLANT
MARKER SKIN DUAL TIP RULER LAB (MISCELLANEOUS) ×2 IMPLANT
PACK BASIC VI WITH GOWN DISP (CUSTOM PROCEDURE TRAY) ×2 IMPLANT
PACK CYSTO (CUSTOM PROCEDURE TRAY) ×2 IMPLANT
PROBE LITHOCLAST ULTRA 3.8X403 (UROLOGICAL SUPPLIES) IMPLANT
PROBE PNEUMATIC 1.0MMX570MM (UROLOGICAL SUPPLIES) ×1 IMPLANT
SCRUB PCMX 4 OZ (MISCELLANEOUS) IMPLANT
SET IRRIG Y TYPE TUR BLADDER L (SET/KITS/TRAYS/PACK) ×2 IMPLANT
SET WARMING FLUID IRRIGATION (MISCELLANEOUS) ×2 IMPLANT
SPONGE LAP 4X18 X RAY DECT (DISPOSABLE) ×2 IMPLANT
STENT ENDOURETEROTOMY 7-14 26C (STENTS) IMPLANT
STONE CATCHER W/TUBE ADAPTER (UROLOGICAL SUPPLIES) IMPLANT
SUT SILK 2 0 30  PSL (SUTURE) ×1
SUT SILK 2 0 30 PSL (SUTURE) ×1 IMPLANT
SYR 20CC LL (SYRINGE) ×4 IMPLANT
SYRINGE 10CC LL (SYRINGE) ×2 IMPLANT
SYRINGE IRR TOOMEY STRL 70CC (SYRINGE) ×2 IMPLANT
TOWEL OR NON WOVEN STRL DISP B (DISPOSABLE) ×2 IMPLANT
TRAY FOLEY CATH 14FRSI W/METER (CATHETERS) ×2 IMPLANT
TUBING CONNECTING 10 (TUBING) ×6 IMPLANT

## 2012-09-15 NOTE — Progress Notes (Signed)
ANTIBIOTIC CONSULT NOTE - INITIAL  Pharmacy Consult for [facility] Indication: Synergy, Post-Op  Allergies  Allergen Reactions  . Metformin And Related     Renal issues   Vital Signs: Temp: 97.6 F (36.4 C) (04/30 1419) Temp src: Oral (04/30 1114) BP: 114/78 mmHg (04/30 1547) Pulse Rate: 54 (04/30 1547)  Labs:  Recent Labs  09/15/12 0703 09/15/12 1437  WBC 7.3  --   HGB 10.1* 10.1*  PLT 327  --   CREATININE 1.30  --      Microbiology: Recent Results (from the past 720 hour(s))  SURGICAL PCR SCREEN     Status: None   Collection Time    09/06/12 12:17 PM      Result Value Range Status   MRSA, PCR NEGATIVE  NEGATIVE Final   Staphylococcus aureus NEGATIVE  NEGATIVE Final   Comment:            The Xpert SA Assay (FDA     approved for NASAL specimens     in patients over 75 years of age),     is one component of     a comprehensive surveillance     program.  Test performance has     been validated by [employer] for patients greater     than or equal to 29 year old.     It is not intended     to diagnose infection nor to     guide or monitor treatment.    Medical History: Past Medical History  Diagnosis Date  . Heart failure, chronic systolic   . Non-ischemic cardiomyopathy SECONDARY TO HX UNCONTROLLED HTN AND ETOH ABUSE----  LAST ECHO EF 50%  09-22-2011    a) Echo 03/25/2010: EF 20-25%; mild AI; Mod MR; Mild LAE  b)cardiac cath 03/26/2010: normal cors; severe pulmonary HTN;PCWP 41; EF 10-15%;  c. echo 4/12:  EF 45-50%, mild LVH, grade 1 diast dysfxn, mild AI, mild LAE  . History of ETOH abuse   . Gout, unspecified     stable  per pt  . Pure hyperglyceridemia   . Anemia   . Personal history of colonic polyps-adenoma 02/17/2012    01/2012 - diminutive adenoma Repeat colon about 01/2017    . Heart block AV first degree   . Hypertension     CARDIOLOGIST-  DR Jens Som--- LOV IN [facility] 09-08-2011  . Chronic renal insufficiency baseline CRE  1.5--1.8    NEPHROLOGIST--   DR WEBB ([facility])  . Nephrolithiasis     RIGHT  . Polycystic kidney disease     BILATERAL RENAL CYST  . Diabetes mellitus type 2, insulin dependent PER pt and LAST PCP NOTE CBG'S IN 100'    RECENTLY UNCONTROLLED AIC 07-07-2012  9.4 AND CBG'S 400'S  . GERD (gastroesophageal reflux disease)   . Arthritis   . CHF (congestive heart failure)     Medications:  Anti-infectives   Start     Dose/Rate Route Frequency Ordered Stop   09/15/12 1800  ampicillin (OMNIPEN) 1 g in sodium chloride 0.9 % 50 mL IVPB     1 g 150 mL/hr over 20 Minutes Intravenous Every 6 hours 09/15/12 1549 09/16/12 1159   09/15/12 0800  ampicillin (OMNIPEN) 2 g in sodium chloride 0.9 % 50 mL IVPB    Comments:  30 MINUTES BEFORE SURGERY   2 g 150 mL/hr over 20 Minutes Intravenous 30 min pre-op 09/15/12 0736 09/15/12 1230   09/15/12 0800  gentamicin (GARAMYCIN)  IVPB 80 mg    Comments:  30 MINUTES BEFORE SURGERY   80 mg 100 mL/hr over 30 Minutes Intravenous 30 min pre-op 09/15/12 0736 09/15/12 1230     Assessment: 52 yo M s/p right percutaneous nephrostolithotomy. Pt received gentamicin 80mg  IV x1 at 12:30 today. Will give a dose of 5mg /kg x1 based on CrCl~5ml/min and ABW=76.4kg to provide post-op synergy.  Plan:  Natasha Bence 5mg /kg x1 (380mg ) No further gent orders  Darrol Angel, PharmD Pager: [phone] 09/15/2012,4:18 PM

## 2012-09-15 NOTE — Progress Notes (Signed)
Notified Dr. Laverle Patter of patient's Hgb of 9.2 and Hct of 29.0.  No new orders at this time.  Will continue to monitor patient.

## 2012-09-15 NOTE — H&P (Signed)
Urology History and Physical Exam  CC: Right nephrolithiasis  HPI: 52 year old male presents today for a right-sided percutaneous nephrostolithotomy for right-sided nephrolithiasis. He had an attempted ureteroscopy for right-sided renal stones earlier this month. At that time narrowing of his right ureter was encountered and instead of forcing the ureteroscope up the ureter I left a stent. After discussing options including dilation of the ureter stenosis versus PCNL, the patient elected to proceed with PCNL. He has a chronic history of urinary stones and a chronic history of renal insufficiency. A CT scan in February 2014 revealed that his right-sided stone burden increased in size from 7 mm to 12 mm. This was in the lower pole of the right kidney. There was also noted to be a 5 mm stone in the right midpole kidney. The stones or not associated with pain. They have worsened over time by enlarging. We have discussed the risks, benefits, alternatives, and likelihood of achieving goals.  Urine culture from 09/06/12 was negative for growth. He was cleared by his cardiologist, Dr. Jens Som, for the surgery. Pre-op labs indicate that he has anemia with an Hgb of 10.1; this was discussed the patient. We reviewed the risk of blood loss and need for transfusion along with it's risks/benefits.  Today he presented to interventional radiology for percutaneous access and placement of a right nephroureteral catheter. This was successful. Review of the films shows that there is lower pole access which will be good for this lower pole stone.  PMH: Past Medical History  Diagnosis Date  . Heart failure, chronic systolic   . Non-ischemic cardiomyopathy SECONDARY TO HX UNCONTROLLED HTN AND ETOH ABUSE----  LAST ECHO EF 50%  09-22-2011    a) Echo 03/25/2010: EF 20-25%; mild AI; Mod MR; Mild LAE  b)cardiac cath 03/26/2010: normal cors; severe pulmonary HTN;PCWP 41; EF 10-15%;  c. echo 4/12:  EF 45-50%, mild LVH, grade 1  diast dysfxn, mild AI, mild LAE  . History of ETOH abuse   . Gout, unspecified     stable  per pt  . Pure hyperglyceridemia   . Anemia   . Personal history of colonic polyps-adenoma 02/17/2012    01/2012 - diminutive adenoma Repeat colon about 01/2017    . Heart block AV first degree   . Hypertension     CARDIOLOGIST-  DR Jens Som--- LOV IN [facility] 09-08-2011  . Chronic renal insufficiency baseline CRE  1.5--1.8    NEPHROLOGIST--  DR WEBB ([facility])  . Nephrolithiasis     RIGHT  . Polycystic kidney disease     BILATERAL RENAL CYST  . Diabetes mellitus type 2, insulin dependent PER pt and LAST PCP NOTE CBG'S IN 100'    RECENTLY UNCONTROLLED AIC 07-07-2012  9.4 AND CBG'S 400'S  . GERD (gastroesophageal reflux disease)   . Arthritis   . CHF (congestive heart failure)     PSH: Past Surgical History  Procedure Laterality Date  . Lumbar fusion  08-10-2003    L5  --  S1  . Removal cyst left lung  1994   AT [facility]    benign  . Knee arthroscopy w/ meniscectomy Left 02-06-2011    AND CHONDROPLASTY  . Cardiac catheterization  03-26-2010  DR Sw[place]    SEVERE LV DYSFUNCTION WITH MARKEDLY ELEVATED FILLING PRESSURE/ SEVERE PULMONARY HYPERTENSION/ NORMAL CORONARY ANATOMY  . Transthoracic echocardiogram  09-22-2011  DR CRENSHAW    EF 50%/  MILD LVH/ GRADE I DIASTOLIC DYSFUNCTION/ MILD TR  . Rhinoplasty  1970'S  nose fx  . Cystoscopy with retrograde pyelogram, ureteroscopy and stent placement Right 08/18/2012    Procedure: CYSTOSCOPY WITH RETROGRADE PYELOGRAM, URETEROSCOPY AND STENT PLACEMENT;  Surgeon: Milford Cage, MD;  Location: [facility];  Service: Urology;  Laterality: Right;  . Colonoscopy      Allergies: Allergies  Allergen Reactions  . Metformin And Related     Renal issues    Medications: Prescriptions prior to admission  Medication Sig Dispense Refill  . Multiple Vitamin (MULTIVITAMIN) capsule Take 1 capsule by mouth daily.      . pravastatin  (PRAVACHOL) 40 MG tablet Take 40 mg by mouth every evening. Take one tablet daily      . spironolactone (ALDACTONE) 25 MG tablet Take 25 mg by mouth every morning.       Marland Kitchen allopurinol (ZYLOPRIM) 300 MG tablet Take 300 mg by mouth every morning.       Marland Kitchen aspirin EC 81 MG tablet Take 81 mg by mouth daily.      . carvedilol (COREG) 25 MG tablet Take 25 mg by mouth 2 (two) times daily with a meal.      . Fenofibric Acid 105 MG TABS Take 1 tablet by mouth every morning.      . ferrous sulfate 325 (65 FE) MG tablet Take 325 mg by mouth daily with breakfast.      . insulin aspart (NOVOLOG FLEXPEN) 100 UNIT/ML injection Inject 6-8 Units into the skin 3 (three) times daily. 8 units breakfast, 6 units lunch, 8 units dinner      . insulin detemir (LEVEMIR FLEXPEN) 100 UNIT/ML injection Inject 32 Units into the skin at bedtime.      Marland Kitchen omega-3 acid ethyl esters (LOVAZA) 1 G capsule Take 2 capsules (2 g total) by mouth 2 (two) times daily.  120 capsule  11     Social History: History   Social History  . Marital Status: Married    Spouse Name: N/A    Number of Children: 1  . Years of Education: N/A   Occupational History  . 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 Smoking status: Former Smoker -- 0.50 packs/day for 32 years    Types: Cigarettes    Quit date: 02/27/2007  . Smokeless tobacco: Never Used  . Alcohol Use: No     Comment: HX ALCOHOL ABUSE--  STATES  Quit 02/2010  . Drug Use: No  . Sexually Active: Not on file   Other Topics Concern  . Not on file   Social History Narrative   No regular exercise   Daily caffeine          Family History: Family History  Problem Relation Age of Onset  . Diabetes Mother   . Diabetes Father   . Hypertension Mother   . Hypertension Father   . Colon cancer Neg Hx     Review of Systems: Positive: Right flank discomfort. Negative: Fever, chest pain, or SOB.  A further 10 point review of systems was negative except  what is listed in the HPI.  Physical Exam:  General: No acute distress.  Awake. Head:  Normocephalic.  Atraumatic. ENT:  EOMI.  Mucous membranes moist Neck:  Supple.  No lymphadenopathy. CV:  S1 present. S2 present. Regular rate. Pulmonary: Equal effort bilaterally.  Clear to auscultation bilaterally. Abdomen: Soft.  Non- tender to palpation. Skin:  Normal turgor.  No visible rash. Extremity: No gross deformity of  bilateral upper extremities.  No gross deformity of    bilateral lower extremities. Neurologic: Alert. Appropriate mood.    Studies:  Recent Labs     09/15/12  0703  HGB  10.1*  WBC  7.3  PLT  327    Recent Labs     09/15/12  0703  NA  136  K  4.0  CL  102  CO2  24  BUN  17  CREATININE  1.30  CALCIUM  9.6  GFRNONAA  62*  GFRAA  72*     Recent Labs     09/15/12  0703  INR  1.01  APTT  30     No components found with this basename: ABG,     Assessment:  Right nephrolithiasis  Plan: To OR for right percutaneous nephrostolithotomy, dilation of right nephrostomy tube tract.

## 2012-09-15 NOTE — Anesthesia Preprocedure Evaluation (Signed)
Anesthesia Evaluation  Patient identified by MRN, date of birth, ID band Patient awake    Reviewed: Allergy & Precautions, H&P , NPO status , Patient's Chart, lab work & pertinent test results  Airway Mallampati: III TM Distance: <3 FB Neck ROM: Full    Dental  (+) Dental Advisory Given   Pulmonary neg pulmonary ROS,  breath sounds clear to auscultation  Pulmonary exam normal       Cardiovascular hypertension, Pt. on medications +CHF Rhythm:Regular Rate:Normal   Comments    Heart failure, chronic systolic     Non-ischemic cardiomyopathy SECONDARY TO HX UNCONTROLLED HTN AND ETOH ABUSE----  LAST ECHO EF 50%  09-22-2011 a) Echo 03/25/2010: EF 20-25%; mild AI; Mod MR; Mild LAE  b)cardiac cath 03/26/2010: normal cors; severe pulmonary HTN;PCWP 41; EF 10-15%;  c. echo 4/12:  EF 45-50%, mild LVH, grade 1 diast dysfxn, mild AI, mild LAE    History of ETOH abuse     Gout, unspecified   stable  per pt     Neuro/Psych negative neurological ROS  negative psych ROS   GI/Hepatic negative GI ROS, Neg liver ROS,   Endo/Other  diabetes, Insulin DependentMorbid obesity  Renal/GU negative Renal ROS  negative genitourinary   Musculoskeletal negative musculoskeletal ROS (+)   Abdominal   Peds negative pediatric ROS (+)  Hematology negative hematology ROS (+)   Anesthesia Other Findings   Reproductive/Obstetrics negative OB ROS                           Anesthesia Physical Anesthesia Plan  ASA: III  Anesthesia Plan: General   Post-op Pain Management:    Induction: Intravenous  Airway Management Planned: Oral ETT  Additional Equipment:   Intra-op Plan:   Post-operative Plan: Extubation in OR  Informed Consent: I have reviewed the patients History and Physical, chart, labs and discussed the procedure including the risks, benefits and alternatives for the proposed anesthesia with the patient or authorized  representative who has indicated his/her understanding and acceptance.   Dental advisory given  Plan Discussed with: CRNA and Surgeon  Anesthesia Plan Comments:         Anesthesia Quick Evaluation

## 2012-09-15 NOTE — Transfer of Care (Signed)
Immediate Anesthesia Transfer of Care Note  Patient: Mike Orozco  Procedure(s) Performed: Procedure(s) with comments: NEPHROLITHOTOMY PERCUTANEOUS (Right) - RIGHT PERCUTANEOUS NEPHROSTOLITHOTOMY   Patient Location: PACU  Anesthesia Type:General  Level of Consciousness: awake, alert , oriented, patient cooperative and responds to stimulation  Airway & Oxygen Therapy: Patient Spontanous Breathing and Patient connected to face mask oxygen  Post-op Assessment: Report given to PACU RN, Post -op Vital signs reviewed and stable and Patient moving all extremities X 4  Post vital signs: stable  Complications: No apparent anesthesia complications

## 2012-09-15 NOTE — H&P (Signed)
Mike Orozco is an 52 y.o. male.   Chief Complaint: Rt nephrolithiasis Attempted Rt cystoscopy 4/2: unable to complete Now scheduled for R percutaneous nephrostomy placement And to OR with Dr Margarita Grizzle HPI: CHF; CAD; HLD; colon polyp-adenoma; HTN; CRI; polycystic kidneys  Past Medical History  Diagnosis Date  . Heart failure, chronic systolic   . Non-ischemic cardiomyopathy SECONDARY TO HX UNCONTROLLED HTN AND ETOH ABUSE----  LAST ECHO EF 50%  09-22-2011    a) Echo 03/25/2010: EF 20-25%; mild AI; Mod MR; Mild LAE  b)cardiac cath 03/26/2010: normal cors; severe pulmonary HTN;PCWP 41; EF 10-15%;  c. echo 4/12:  EF 45-50%, mild LVH, grade 1 diast dysfxn, mild AI, mild LAE  . History of ETOH abuse   . Gout, unspecified     stable  per pt  . Pure hyperglyceridemia   . Anemia   . Personal history of colonic polyps-adenoma 02/17/2012    01/2012 - diminutive adenoma Repeat colon about 01/2017    . Heart block AV first degree   . Hypertension     CARDIOLOGIST-  DR Jens Som--- LOV IN [facility] 09-08-2011  . Chronic renal insufficiency baseline CRE  1.5--1.8    NEPHROLOGIST--  DR WEBB ([facility])  . Nephrolithiasis     RIGHT  . Polycystic kidney disease     BILATERAL RENAL CYST  . Diabetes mellitus type 2, insulin dependent PER pt and LAST PCP NOTE CBG'S IN 100'    RECENTLY UNCONTROLLED AIC 07-07-2012  9.4 AND CBG'S 400'S  . GERD (gastroesophageal reflux disease)   . Arthritis   . CHF (congestive heart failure)     Past Surgical History  Procedure Laterality Date  . Lumbar fusion  08-10-2003    L5  --  S1  . Removal cyst left lung  1994   AT [facility]    benign  . Knee arthroscopy w/ meniscectomy Left 02-06-2011    AND CHONDROPLASTY  . Cardiac catheterization  03-26-2010  DR [place]    SEVERE LV DYSFUNCTION WITH MARKEDLY ELEVATED FILLING PRESSURE/ SEVERE PULMONARY HYPERTENSION/ NORMAL CORONARY ANATOMY  . Transthoracic echocardiogram  09-22-2011  DR CRENSHAW    EF 50%/  MILD LVH/ GRADE I  DIASTOLIC DYSFUNCTION/ MILD TR  . Rhinoplasty  1970'S    nose fx  . Cystoscopy with retrograde pyelogram, ureteroscopy and stent placement Right 08/18/2012    Procedure: CYSTOSCOPY WITH RETROGRADE PYELOGRAM, URETEROSCOPY AND STENT PLACEMENT;  Surgeon: Milford Cage, MD;  Location: [facility];  Service: Urology;  Laterality: Right;  . Colonoscopy      Family History  Problem Relation Age of Onset  . Diabetes Mother   . Diabetes Father   . Hypertension Mother   . Hypertension Father   . Colon cancer Neg Hx    Social History:  reports that he quit smoking about 5 years ago. His smoking use included Cigarettes. He has a 16 pack-year smoking history. He has never used smokeless tobacco. He reports that he does not drink alcohol or use illicit drugs.  Allergies:  Allergies  Allergen Reactions  . Metformin And Related     Renal issues     (Not in a hospital admission)  Results for orders placed during the hospital encounter of 09/15/12 (from the past 48 hour(s))  APTT     Status: None   Collection Time    09/15/12  7:03 AM      Result Value Range   aPTT 30  24 - 37 seconds  BASIC METABOLIC  PANEL     Status: Abnormal   Collection Time    09/15/12  7:03 AM      Result Value Range   Sodium 136  135 - 145 mEq/L   Potassium 4.0  3.5 - 5.1 mEq/L   Chloride 102  96 - 112 mEq/L   CO2 24  19 - 32 mEq/L   Glucose, Bld 135 (*) 70 - 99 mg/dL   BUN 17  6 - 23 mg/dL   Creatinine, Ser 1.61  0.50 - 1.35 mg/dL   Calcium 9.6  8.4 - 09.6 mg/dL   GFR calc non Af Amer 62 (*) >90 mL/min   GFR calc Af Amer 72 (*) >90 mL/min   Comment:            The eGFR has been calculated     using the CKD EPI equation.     This calculation has not been     validated in all clinical     situations.     eGFR's persistently     <90 mL/min signify     possible Chronic Kidney Disease.  CBC     Status: Abnormal   Collection Time    09/15/12  7:03 AM      Result Value Range   WBC 7.3   4.0 - 10.5 K/uL   RBC 3.69 (*) 4.22 - 5.81 MIL/uL   Hemoglobin 10.1 (*) 13.0 - 17.0 g/dL   HCT 04.5 (*) 40.9 - 81.1 %   MCV 88.3  78.0 - 100.0 fL   MCH 27.4  26.0 - 34.0 pg   MCHC 31.0  30.0 - 36.0 g/dL   RDW 91.4  78.2 - 95.6 %   Platelets 327  150 - 400 K/uL  PROTIME-INR     Status: None   Collection Time    09/15/12  7:03 AM      Result Value Range   Prothrombin Time 13.2  11.6 - 15.2 seconds   INR 1.01  0.00 - 1.49   No results found.  Review of Systems  Constitutional: Negative for fever.  Respiratory: Negative for shortness of breath.   Cardiovascular: Negative for chest pain.  Gastrointestinal: Negative for nausea, vomiting and abdominal pain.  Genitourinary: Positive for flank pain.  Neurological: Negative for dizziness, weakness and headaches.    Blood pressure 129/87, pulse 66, temperature 98 F (36.7 C), temperature source Oral, resp. rate 18, height 5\' 6"  (1.676 m), weight 210 lb (95.255 kg), SpO2 99.00%. Physical Exam  Constitutional: He is oriented to person, place, and time. He appears well-developed and well-nourished.  Cardiovascular: Normal rate, regular rhythm and normal heart sounds.   No murmur heard. Respiratory: Effort normal and breath sounds normal. He has no wheezes.  GI: Soft. Bowel sounds are normal. There is no tenderness.  Musculoskeletal: Normal range of motion.  Neurological: He is alert and oriented to person, place, and time.  Skin: Skin is warm and dry.  Psychiatric: He has a normal mood and affect. His behavior is normal. Judgment and thought content normal.     Assessment/Plan Rt renal stone for OR today Scheduled for R PCN first Pt aware of procedure benefits and risks and agreeable to proceed Consent signed and in chart  Kendalynn Wideman A 09/15/2012, 7:57 AM

## 2012-09-15 NOTE — Progress Notes (Signed)
ADMITTED W/ R NEPHROLITHIASIS.S/P R PCN.FROM HOME.

## 2012-09-15 NOTE — Progress Notes (Signed)
PACU Nursing Note: upon arrival to assigned room, pt's NBP was assessed, SBP at 60's, pt moved to bed and NBP rec'd, HR at 54 /min, SB w/ 1 degree AVB, NBP 76/52, NBP ck'd in Rt arm, pt denies any chest pain, SOB, n&v. Pt HOB to flat position, Heart sounds at S1S2 easily noted, cap refill WNL, skin W&D, 2+ radial pulses Rt and Lt easily palpated, NBP repeated several times, NBP now at 114/78 (88) MD aware of above changes, orders rec'd for H&H lab draw at 1930hrs and results to be called to MD

## 2012-09-15 NOTE — Procedures (Signed)
R 4f antegrade nephroureteral via RLP adjacent to calc No complication No blood loss. See complete dictation in [facility].

## 2012-09-15 NOTE — Progress Notes (Addendum)
GU post-op check  Patient did well postoperatively. He complains of right flank pain which is expected.  I explained the course of the surgery and the surgical findings to the patient.  Filed Vitals:   09/15/12 1515  BP: 109/73  Pulse: 63  Temp:   Resp: 14   Gen: NAD CV: RRR Chest: CTA-B Abd: Right nephrostomy tube was draining bloody urine which eventually clotted itself off. Foley catheter was initially draining urine the curvature equal leg, but it has cleared to an amber color.  Post-op Hgb: 10.1 (was 10.4 at pre-op appointment)  A/P: Right nephrolithiasis. Right percutaneous nephrostolithotomy today.  Admit to telemetry for observation due to history of congestive heart failure.  Gentle hydration with IV fluids.   CBC & BMP in morning.  I will place him on bedrest do to help bloody his urine is, but postop hemoglobin check was stable.  ADDENDUM: I was called after the patient was transferred to the floor. During transfer his systolic BP dropped to the 70's, which corrected itself when he laid down.  I saw the patient again. He only complained of right flank pain that is within the realm of normal following this surgery. Negative lightheadedness or dizziness.  Filed Vitals:   09/15/12 1547  BP: 114/78  Pulse: 54  Temp:   Resp: 16   Gen: NAD CV: RRR Chest: CTA-B GU: Urine in catheter is coolaid red again; scant red drainage from right nephrostomy tube  Abd: soft, NTTP, ND  A/P: Right nephrolithiasis. Transient hypotension. His BP returned to normal without intervention and he is asymptomatic. Will recheck an H&H in 4 hours. Result to be paged to MD on call. I have checked out to my colleague, Dr. Laverle Patter, who will be on call tonight. I also notified the patient's wife.

## 2012-09-15 NOTE — Op Note (Signed)
Urology Operative Report  Date of Procedure: 09/15/12  Surgeon: Natalia Leatherwood, MD Assistant: None  Preoperative Diagnosis: Right nephrolithiasis Postoperative Diagnosis:  Same  Procedure(s): Right percutaneous nephrostolithotomy (<2cm) Dilation of right nephrostomy tract.  Estimated blood loss: 150cc.  Specimen: Stone sent to [facility] lab for chemical analysis.  Drains: Foley, right percutaneous nephrostomy tube, right nephroureteral cath.  Complications: None  Findings: Right renal stone (>1 cm and <2 cm)  History of present illness: 52 year old male presents today for right-sided percutaneous nephrostolithotomy due to enlarging right lower pole stone. His most recent CT scan of the liver 2014 showed the stone enlarged from 7 mm to 12 mm. He had attempted ureteroscopy which was unsuccessful due to 2 ureter stenosis. Rather than going through his dilation of his ureter and possible damage to the ureter he is elected for percutaneous nephrostolithotomy. He had his percutaneous nephrostomy tube placed this morning in interventional radiology.   Procedure in detail: After informed consent was obtained, the patient was taken to the operating room. They were placed in the supine position. SCDs were turned on and in place. IV antibiotics were infused, and general anesthesia was induced. A Foley catheter was placed using sterile technique through the urethra. He was then placed in a prone position using perpendicular gel rolls on the thorax and hips and gel padding on the arms and legs to ensure good padding at all neurovascular pressure points. His right flank was identified with a right small gauge nephroureteral catheter emanating from the right flank. This site was prepped and draped in usual sterile fashion. A timeout was performed in which the correct patient, surgical site, and procedure were identified and agreed upon by the team.  I then placed a superstiff wire down the nephroureteral  catheter into the bladder on fluoroscopy. I then removed the nephroureteral catheter and made an incision through the skin and a nick in the fascia with an 11 blade scalpel along the wire. I then placed a dual lumen ureter access sheath over the existing wire and into the renal collecting system. I injected Omnipaque to obtain a right antegrade pyelogram. This revealed no extravasation of contrast and it revealed that we were indeed in a posterior inferior calyx. The dual-lumen catheter was advanced into the proximal ureter where a second superstiff wire was placed down into the bladder. After this was done one wire was secured as a safety wire and the other was used as a working wire. The balloon dilating catheter was then placed over the wire under fluoroscopy to where the tip was just inside the collecting system. This was inflated to 12 atmospheres and held for 4 minutes. The access sheath was then placed over the inflated balloon and the balloon was deflated. There was no return of blood. The working wire was then secured to the sheath as well. A rigid nephroscope was advanced into the kidney and we were in the collecting system. There was no medial perforation noted. Next a flexible nephroscope was used to explore the inside of the kidney. The area where there appeared to be a 5 mm calculus in the mid pole turned out to be a Randall's plaque. The 12 mm stone seen on CT scan was immediately visible. The rigid nephroscope was placed at the 3 prong grasper was used to grasp the stone and its entirety. This was removed.  The dual-lumen access sheath was then placed over the working wire and I injected contrast to obtain an antegrade right-sided pyelogram.  There was no extravasation or medial perforation. I then placed a 5 [place] ureter catheter into the right distal ureter over the safety wire and secured this back to the drape. A 22 French council-tip catheter was then placed over the working wire and into the  renal collecting system. It was inflated with 4 cc of Omnipaque. This allowed [place] to visualize the collecting system and showed that the catheter was seated in a good position. A working wire was removed and access sheath was removed. There was no hemorrhage noted. The safety wire was removed from the nephroureteral catheter. The nephroureteral catheter and nephrostomy tube were then sutured into place using silk suture. A sterile dressing was applied and this completed the procedure. The nephrostomy tube was placed to drainage.  The patient was placed in a supine position, anesthesia was reversed, and he was taken to the PACU in stable condition. He will be admitted overnight for observation.  All counts were correct at the end of the procedure.

## 2012-09-16 ENCOUNTER — Encounter (HOSPITAL_COMMUNITY): Payer: Self-pay | Admitting: Urology

## 2012-09-16 DIAGNOSIS — D62 Acute posthemorrhagic anemia: Secondary | ICD-10-CM | POA: Diagnosis not present

## 2012-09-16 LAB — BASIC METABOLIC PANEL
CO2: 27 mEq/L (ref 19–32)
Calcium: 8.4 mg/dL (ref 8.4–10.5)
Creatinine, Ser: 1.56 mg/dL — ABNORMAL HIGH (ref 0.50–1.35)
Glucose, Bld: 126 mg/dL — ABNORMAL HIGH (ref 70–99)

## 2012-09-16 LAB — CBC
Hemoglobin: 8.3 g/dL — ABNORMAL LOW (ref 13.0–17.0)
MCH: 28.3 pg (ref 26.0–34.0)
MCHC: 31.8 g/dL (ref 30.0–36.0)
MCV: 89.1 fL (ref 78.0–100.0)
RBC: 2.93 MIL/uL — ABNORMAL LOW (ref 4.22–5.81)

## 2012-09-16 LAB — GLUCOSE, CAPILLARY: Glucose-Capillary: 126 mg/dL — ABNORMAL HIGH (ref 70–99)

## 2012-09-16 LAB — HEMOGLOBIN AND HEMATOCRIT, BLOOD: HCT: 25.8 % — ABNORMAL LOW (ref 39.0–52.0)

## 2012-09-16 MED ORDER — SODIUM CHLORIDE 0.9 % IV SOLN
1.0000 g | Freq: Four times a day (QID) | INTRAVENOUS | Status: DC
  Administered 2012-09-16 – 2012-09-17 (×4): 1 g via INTRAVENOUS
  Filled 2012-09-16 (×5): qty 1000

## 2012-09-16 NOTE — Progress Notes (Signed)
Urology Progress Note  Subjective:     No acute urologic events overnight. He has right flank pain that is well controlled. Negative abdominal pain.   Patient continues on bedrest. He is hungry. Negative lightheadedness or dizziness.  ROS: Negative: chest pain or SOB.  Objective:  Patient Vitals for the past 24 hrs:  BP Temp Temp src Pulse Resp SpO2  09/16/12 0626 112/78 mmHg 98.2 F (36.8 C) Oral 74 18 100 %  09/16/12 0147 112/80 mmHg 98.6 F (37 C) Oral 76 18 100 %  09/15/12 2147 122/75 mmHg 98.3 F (36.8 C) Oral 77 18 100 %  09/15/12 1838 124/86 mmHg - - 68 - -  09/15/12 1547 114/78 mmHg - - 54 16 100 %  09/15/12 1537 103/80 mmHg - - 54 14 100 %  09/15/12 1536 91/69 mmHg - - 53 14 100 %  09/15/12 1534 76/52 mmHg - - 54 15 100 %  09/15/12 1515 109/73 mmHg - - 63 14 100 %  09/15/12 1500 140/91 mmHg - - 57 12 100 %  09/15/12 1445 142/94 mmHg - - 57 16 100 %  09/15/12 1437 - - - 57 15 -  09/15/12 1430 154/101 mmHg - - 57 17 100 %  09/15/12 1419 155/90 mmHg 97.6 F (36.4 C) - 59 16 100 %    Physical Exam: General:  No acute distress, awake Cardiovascular:    [x]   S1/S2 present, RRR  []   Irregularly irregular Chest:  CTA-B Abdomen:               []  Soft, appropriately TTP  [x]  Soft, NTTP  []  Soft, appropriately TTP, incision(s) clean/dry/intact  Genitourinary: Right nephrostomy tube without output. Negative right flank ecchymosis.  Foley:  In place. Draining light pink urine.    I/O last 3 completed shifts: In: 1150 [I.V.:1050; IV Piggyback:100] Out: 1075 [Urine:925; Blood:150]  Recent Labs     09/15/12  0703   09/15/12  1919  09/16/12  0512  HGB  10.1*   < >  9.2*  8.3*  WBC  7.3   --    --   5.2  PLT  327   --    --   276   < > = values in this interval not displayed.    Recent Labs     09/15/12  0703  09/16/12  0512  NA  136  138  K  4.0  3.9  CL  102  104  CO2  24  27  BUN  17  15  CREATININE  1.30  1.56*  CALCIUM  9.6  8.4  GFRNONAA  62*   50*  GFRAA  72*  58*     Recent Labs     09/15/12  0703  INR  1.01  APTT  30     No components found with this basename: ABG,     Length of stay: 1 days.  Assessment: Right nephrolithiasis. Acute blood loss anemia in the face of chronic anemia. POD#1 Right Percutaneous nephrostolithotomy   Plan: -Diabetic diet. -Will continue ampicillin. -Recheck hemoglobin this morning at 10:30 am to assess stability. -Continue bedrest until hemoglobin stable. -We discussed blood transfusion along with the risks/benefits/side effects (including lung injury, CHF exacerbation, allergic reaction, viral/bacterial infection, death). Transfusion consent signed. I spoke with Dr. Shirlee Latch of [facility] cardiology who recommended transfusion if Hgb dropped below 7.   Natalia Leatherwood, MD [phone]

## 2012-09-16 NOTE — Progress Notes (Signed)
GU  Right flank pain well controlled. Has some constipation. Negative lightheadedness after up out of bed. Negative chest pain or SOB.  Hemoglobin stabilized at 7.9  Filed Vitals:   09/16/12 1754  BP: 143/92  Pulse:   Temp:   Resp:     Gen: NAD CV:RRR Abd: Soft, NTTP GU: Right perc drain with scant red output.   A/P: Right nephrolithiasis. Acute blood loss anemia. -Anemia stabilized. -Ambulate. -D/c foley. -Cap nephrostomy tube. -H&H in morning.

## 2012-09-16 NOTE — Care Management Note (Addendum)
    Page 1 of 1   09/17/2012     1:40:59 PM   CARE MANAGEMENT NOTE 09/17/2012  Patient:  EMARION, TORAL   Account Number:  [id number]  Date Initiated:  09/15/2012  Documentation initiated by:  Lanier Clam  Subjective/Objective Assessment:   ADMITTED W/R NEPHROLITHIASIS.     Action/Plan:   FROM HOME.HAS PCP,PHARMACY.   Anticipated DC Date:  09/17/2012   Anticipated DC Plan:  HOME/SELF CARE      DC Planning Services  CM consult      Choice offered to / List presented to:             Status of service:  Completed, signed off Medicare Important Message given?   (If response is "NO", the following Medicare IM given date fields will be blank) Date Medicare IM given:   Date Additional Medicare IM given:    Discharge Disposition:  HOME/SELF CARE  Per UR Regulation:  Reviewed for med. necessity/level of care/duration of stay  If discussed at Long Length of Stay Meetings, dates discussed:    Comments:  09/16/12 Dalen Hennessee RN,BSN NCM [phone] POD#1 R PCN,HGB 8.3,CHECK H/H,LOW BP-MONITOR.NO D/C NEEDS.  09/15/12 Michaelangelo Mittelman RN,BSN NCM [phone] S/P R PCN.

## 2012-09-17 LAB — HEMOGLOBIN AND HEMATOCRIT, BLOOD
HCT: 24.2 % — ABNORMAL LOW (ref 39.0–52.0)
Hemoglobin: 7.5 g/dL — ABNORMAL LOW (ref 13.0–17.0)

## 2012-09-17 LAB — GLUCOSE, CAPILLARY: Glucose-Capillary: 121 mg/dL — ABNORMAL HIGH (ref 70–99)

## 2012-09-17 MED ORDER — SENNOSIDES-DOCUSATE SODIUM 8.6-50 MG PO TABS: 1.0000 | ORAL_TABLET | Freq: Two times a day (BID) | ORAL | Status: DC

## 2012-09-17 MED ORDER — OXYCODONE-ACETAMINOPHEN 5-325 MG PO TABS: 1.0000 | ORAL_TABLET | ORAL | Status: DC | PRN

## 2012-09-17 MED ORDER — CIPROFLOXACIN HCL 500 MG PO TABS: 500.0000 mg | ORAL_TABLET | Freq: Two times a day (BID) | ORAL | Status: DC

## 2012-09-17 NOTE — Progress Notes (Signed)
Urology Progress Note  Subjective:     No acute urologic events overnight. Pain greatly improved and controlled with PO meds.  Positive ambulation, positive voiding, tolerating regular diet.  ROS: Negative: chest pain or SOB or lightheadedness.  Objective:  Patient Vitals for the past 24 hrs:  BP Temp Temp src Pulse Resp SpO2  09/17/12 0522 124/78 mmHg 98.8 F (37.1 C) Oral 87 19 96 %  09/16/12 2059 130/88 mmHg 99.9 F (37.7 C) Oral 91 18 93 %  09/16/12 1754 143/92 mmHg - - - - -  09/16/12 1500 123/80 mmHg 99.5 F (37.5 C) Oral 83 18 98 %  09/16/12 1025 125/84 mmHg 98.3 F (36.8 C) Oral 77 18 99 %    Physical Exam: General:  No acute distress, awake Cardiovascular:    [x]   S1/S2 present, RRR  []   Irregularly irregular Chest:  CTA-B Abdomen:               []  Soft, appropriately TTP  [x]  Soft, NTTP  []  Soft, appropriately TTP, incision(s) clean/dry/intact  Genitourinary: Right nephrostomy tube capped. Dressing with pink fluid (likely urine). Negative right flank ecchymosis.  Foley:  None    I/O last 3 completed shifts: In: 3959.5 [I.V.:3200; IV Piggyback:759.5] Out: 2200 [Urine:2200]  Recent Labs     09/15/12  0703   09/16/12  0512   09/16/12  1645  09/17/12  0500  HGB  10.1*   < >  8.3*   < >  7.9*  7.5*  WBC  7.3   --   5.2   --    --    --   PLT  327   --   276   --    --    --    < > = values in this interval not displayed.    Recent Labs     09/15/12  0703  09/16/12  0512  NA  136  138  K  4.0  3.9  CL  102  104  CO2  24  27  BUN  17  15  CREATININE  1.30  1.56*  CALCIUM  9.6  8.4  GFRNONAA  62*  50*  GFRAA  72*  58*     Recent Labs     09/15/12  0703  INR  1.01  APTT  30     No components found with this basename: ABG,     Length of stay: 2 days.  Assessment: Right nephrolithiasis. Acute blood loss anemia in the face of chronic anemia. POD#2 Right Percutaneous nephrostolithotomy   Plan: -Patient given verbal discharge  instructions. -Discontinue home with right nephrostomy tube and right nephroureter cath in place.  -Nurse to teach patient how to change dressing BID and prn.    Natalia Leatherwood, MD [phone]

## 2012-09-17 NOTE — Progress Notes (Signed)
Patient's right flank dressing was saturated.  Dressing changed.  Patient tolerated dressing change well.  Will continue to monitor patient.

## 2012-09-17 NOTE — Discharge Summary (Signed)
Physician Discharge Summary  Patient ID: Mike Orozco MRN: [id number] DOB/AGE: 53/25/1963 53 y.o.  Admit date: 09/15/2012 Discharge date: 09/17/2012  Admission Diagnoses: Nephrolithiasis. Chronic anemia.  Discharge Diagnoses:  Active Problems:   Anemia   Kidney stones   Acute blood loss anemia   Discharged Condition: good  Hospital Course:   This patient was admitted postoperatively following a right percutaneous nephrostolithotomy. Surgery went well and we were able to successfully remove his stone. He has a history of chronic anemia and started off with anemia at baseline. He did have some acute blood loss anemia which was asymptomatic. He had one episode of hypotension which resolved with change in body position and he remained stable from there on out. He was on bedrest for the first 24 hours while we waited for his hemoglobin stabilized. I contacted  [facility] to determine what would be the most appropriate time for blood transfusion. They recommended transfusing if the hemoglobin drop below 7. His hemoglobin stabilized approximately 24 hours later and his Foley catheter was removed and he was able to ambulate. He is able to tolerate a regular diet. The following morning after ambulating his hemoglobin remained stable. His pain was controlled with oral pain medications. It was felt that he could be discharged home. I felt it would be most appropriate to leave his approximate 2 and placed to continue to [facility] not any bleed may be present and remove it next week in clinic.  Consults: None  Significant Diagnostic Studies: labs: Hemoglobin stabilized.  Treatments: IV hydration, antibiotics: ampicillin and surgery: Percutaneous nephrostolithotomy (right)  Discharge Exam: Blood pressure 124/78, pulse 87, temperature 98.8 F (37.1 C), temperature source Oral, resp. rate 19, SpO2 96.00%. Refer to PE from progress note on discharge date.  Disposition: 01-Home or Self  Care  Discharge Orders   Future Appointments Provider Department Dept Phone   10/01/2012 10:30 AM Young Berry May, RN [facility] [phone]   10/29/2012 10:00 AM Carlus Pavlov, MD [facility] ENDOCRINOLOGY [phone]   12/06/2012 10:00 AM Etta Grandchild, MD [facility] [phone]   Future Orders Complete By Expires     Discharge patient  As directed         Medication List    STOP taking these medications       aspirin EC 81 MG tablet     omega-3 acid ethyl esters 1 G capsule  Commonly known as:  LOVAZA      TAKE these medications       allopurinol 300 MG tablet  Commonly known as:  ZYLOPRIM  Take 300 mg by mouth every morning.     carvedilol 25 MG tablet  Commonly known as:  COREG  Take 25 mg by mouth 2 (two) times daily with a meal.     ciprofloxacin 500 MG tablet  Commonly known as:  CIPRO  Take 1 tablet (500 mg total) by mouth 2 (two) times daily.     Fenofibric Acid 105 MG Tabs  Take 1 tablet by mouth every morning.     ferrous sulfate 325 (65 FE) MG tablet  Take 325 mg by mouth daily with breakfast.     LEVEMIR FLEXPEN 100 UNIT/ML injection  Generic drug:  insulin detemir  Inject 32 Units into the skin at bedtime.     multivitamin capsule  Take 1 capsule by mouth daily.     NOVOLOG FLEXPEN 100 UNIT/ML injection  Generic drug:  insulin aspart  Inject 6-8 Units into the skin 3 (three) times daily. 8 units breakfast, 6 units lunch, 8 units dinner     oxyCODONE-acetaminophen 5-325 MG per tablet  Commonly known as:  PERCOCET  Take 1-2 tablets by mouth every 4 (four) hours as needed for pain.     pravastatin 40 MG tablet  Commonly known as:  PRAVACHOL  Take 40 mg by mouth every evening. Take one tablet daily     senna-docusate 8.6-50 MG per tablet  Commonly known as:  SENOKOT S  Take 1 tablet by mouth 2 (two) times daily.     spironolactone 25 MG tablet  Commonly known  as:  ALDACTONE  Take 25 mg by mouth every morning.           Follow-up Information   Follow up with Milford Cage, MD. Call on . (Call for appt with MD next week.)    Contact information:   [address] [place] [address] [phone]       Signed: Milford Cage 09/17/2012, 7:26 AM

## 2012-09-23 NOTE — Anesthesia Postprocedure Evaluation (Signed)
  Anesthesia Post-op Note  Patient: Mike Orozco  Procedure(s) Performed: Procedure(s) (LRB): NEPHROLITHOTOMY PERCUTANEOUS (Right)  Patient Location: PACU  Anesthesia Type: General  Level of Consciousness: awake and alert   Airway and Oxygen Therapy: Patient Spontanous Breathing  Post-op Pain: mild  Post-op Assessment: Post-op Vital signs reviewed, Patient's Cardiovascular Status Stable, Respiratory Function Stable, Patent Airway and No signs of Nausea or vomiting  Last Vitals:  Filed Vitals:   09/17/12 0522  BP: 124/78  Pulse: 87  Temp: 37.1 C  Resp: 19    Post-op Vital Signs: stable   Complications: No apparent anesthesia complications

## 2012-09-27 ENCOUNTER — Encounter: Payer: Self-pay | Admitting: Internal Medicine

## 2012-10-01 ENCOUNTER — Encounter: Payer: Managed Care, Other (non HMO) | Attending: Internal Medicine | Admitting: Dietician

## 2012-10-01 VITALS — Ht 66.0 in | Wt 205.6 lb

## 2012-10-01 DIAGNOSIS — E1149 Type 2 diabetes mellitus with other diabetic neurological complication: Secondary | ICD-10-CM | POA: Insufficient documentation

## 2012-10-01 DIAGNOSIS — Z713 Dietary counseling and surveillance: Secondary | ICD-10-CM | POA: Insufficient documentation

## 2012-10-01 NOTE — Progress Notes (Signed)
  Medical Nutrition Therapy:  Appt start time: 1100 end time:  1130.  Assessment:  Primary concerns today: continue to keep glucose down, to keep losing weight and to use less medication.Has had his kidney stone surgery and is feeling much better.  Starting to increase his activity and to resume his walking on the treadmill.  Starting back slowly.  Pleased that he has not gained weight but is eager to get back on a routine of regular walking and exercise.   Reports that his latest A1C was 5.5% for (09/06/2012), a decrease from 9.4% March 2014.  MEDICATIONS: Continues his Novolog sliding scale and Levemir  28 units for glucose control  BLOOD GLUCOSE: Fasting: 99-132     AC Lunch: 87-110    AC dinner: 91,117-167    HS: 117,126  DIETARY INTAKE:  24-hr recall:  B (7:00 AM): steak and egg sandwich from [employer]  2 eggs, [place] bacon and wheat bread and 8 oz soy milk +/- grits. Snk (mid AM) :none water L (12;00 PM): big mac and fries (small) and diet root beer.   Snk (mid PM): none D (6:00 PM): sub with the Root Beer mustard on the [place] sub, cheese, lettuce and tomato,.  Snk (HS PM): sometimes a brownie,  Beverages: soy milk, diet soda,water 2-3   Recent physical activity: Getting back to exercise after the surgery.  Estimated energy needs:HT: 66 in WT: 206.6 lb BMI: 33.4 kg/m2 Ad WT:165 lb (76 kg)  1700-1800 calories (Currently his recall is closer to 1600-1700 which will facilitate more weight loss)  195-200 g carbohydrates  130-135 g protein  46-49 g fat  Progress Towards Goal(s):  In progress.   Nutritional Diagnosis:  [place]-2.1 Inpaired nutrition utilization As related to glucose.  As evidenced by diagnosis of type 2 diabetes with a need for insulin for glucose control.    Intervention:  Nutrition/Diabetes  Start back to exercise at low and slow.  Work back up the previous level of exercise.  Keep the protein levels up.  For the shake, smoothie, use water or soy milk or  (carb at less than 10 gm) then protein powder 20 gm or more, and carb at 3 gm or less.  Do the shake 1 time per day..  Handouts given during visit include:  After visit summary  Monitoring/Evaluation:  Dietary intake, exercise, blood glucose monitoring, and body weight In 12 weeks with Bev Paddock CDE.

## 2012-10-01 NOTE — Patient Instructions (Addendum)
   Start back to exercise at low and slow.  Work back up the previous level of exercise.  Keep the protein levels up.  For the shake, smoothie, use water or soy milk or (carb at less than 10 gm) then protein powder 20 gm or more, and carb at 3 gm or less.  Do the shake 1 time per day.Marland Kitchen

## 2012-10-07 ENCOUNTER — Ambulatory Visit (INDEPENDENT_AMBULATORY_CARE_PROVIDER_SITE_OTHER): Payer: Managed Care, Other (non HMO) | Admitting: Internal Medicine

## 2012-10-07 ENCOUNTER — Encounter: Payer: Self-pay | Admitting: Internal Medicine

## 2012-10-07 VITALS — BP 118/62 | HR 63 | Temp 97.5°F | Resp 12 | Ht 66.0 in | Wt 207.0 lb

## 2012-10-07 DIAGNOSIS — E1149 Type 2 diabetes mellitus with other diabetic neurological complication: Secondary | ICD-10-CM

## 2012-10-07 MED ORDER — LINAGLIPTIN 5 MG PO TABS: 5.0000 mg | ORAL_TABLET | Freq: Every day | ORAL | Status: DC

## 2012-10-07 MED ORDER — INSULIN ASPART 100 UNIT/ML ~~LOC~~ SOLN: SUBCUTANEOUS | Status: DC

## 2012-10-07 NOTE — Patient Instructions (Signed)
You can stop the breakfast and lunch Humalog doses. Keep the dinnertime Humalog. Inject Humalog 15 minutes before a meal. If your sugar is low (like today), you can inject Humalog ~5 minutes before a meal or even at the start of the meal. Start Tradjenta at 5 mg daily in a.m. Please return in 3 months with your sugar log. You can send me your sugars through my chart.

## 2012-10-07 NOTE — Progress Notes (Signed)
Subjective:     Patient ID: Mike Orozco, male   DOB: 10/10/1961, 52 y.o.   MRN: [id number]  HPI Mike Orozco is a pleasant 52 year old man, returning for f/u for DM2, uncontrolled, insulin-dependent, with complications (chronic kidney disease - GFR 40, peripheral neuropathy, systolic CHF). He is here with his wife.  He has recently diagnosed diabetes. His last hemoglobin A1c was: Lab Results  Component Value Date   HGBA1C 5.5 09/06/2012  Previously 9.4% 3 months ago!  He is now on a regimen of: - Levemir 28 units now (decreased from 32, 2 weeks ago) - Humalog 6-4-6 (decreased from 02-21-09, 2 weeks ago)  He checks his sugars 4 times a day, before meals and at bedtime. He sent me his sugars through my chart and we have make insulin adjustments since last visit, as mentioned above. He brings his sugar log with him, that also includes his diet log.  - Before breakfast: 98-148, with most sugars between 110 and 120 >> now 91-120 - 2 hours after breakfast 125-149 >> not checking - Before lunch 75-187 >> 77- 140 - 2 hours after lunch 107-201, these being his higher sugars >> not checking - Before dinner 86-157, with 1 low at 63 (only had peanuts for lunch) and 1 high at 198 >> now 91-140, 2 x 170 - 2 hours after dinner 100-150, with one high at 209 (after the 63 above, did not bolus) >> not checking - bedtime 100-140 >> now same as after dinner  He denies lows. He has seen nutrition the month ago. He has a kidney stone, and had right percutaneous nephrostolithotomy. He is returning to work soon, and he might have to change his insurance at that time.  He has chronic kidney disease, with the last BUN/creatinine: Lab Results  Component Value Date   BUN 15 09/16/2012   Lab Results  Component Value Date   CREATININE 1.56* 09/16/2012   His last lipid panel showed a total cholesterol of 201, triglycerides of 908, HDL of 38. Last eye exam: no DR, last year. No numbness and tingling in legs.   The patient  also has a history of anxiety, hypertension, hyperlipidemia, nonischemic cardiomyopathy, gout, anemia, kidney stones (sees urology), tobacco and alcohol abuse.   Review of Systems Constitutional: no weight gain/loss, no fatigue, no subjective hyperthermia/hypothermia Eyes: no blurry vision, no xerophthalmia ENT: no sore throat, no nodules palpated in throat, no dysphagia/odynophagia, no hoarseness Cardiovascular: no CP/SOB/palpitations/leg swelling Respiratory: no cough/SOB Gastrointestinal: no N/V/D/C Musculoskeletal: no muscle/joint aches Skin: no rashes Neurological: no tremors/numbness/tingling/dizziness Psychiatric: no depression/anxiety  I reviewed pt's medications, allergies, PMH, social hx, family hx and no changes required.    Objective:   Physical Exam BP 118/62  Pulse 63  Temp(Src) 97.5 F (36.4 C) (Oral)  Resp 12  Ht 5\' 6"  (1.676 m)  Wt 207 lb (93.895 kg)  BMI 33.43 kg/m2  SpO2 98% Wt Readings from Last 3 Encounters:  10/07/12 207 lb (93.895 kg)  10/01/12 205 lb 9.6 oz (93.26 kg)  09/15/12 210 lb (95.255 kg)  Constitutional: overweight, in NAD Eyes: PERRLA, EOMI, no exophthalmos ENT: moist mucous membranes, no thyromegaly, no cervical lymphadenopathy Cardiovascular: RRR, No MRG Respiratory: CTA B Gastrointestinal: abdomen soft, NT, ND, BS+ Musculoskeletal: no deformities, strength intact in all 4 Skin: moist, warm, no rashes Neurological: no tremor with outstretched hands, DTR normal in all 4  Assessment:     1. DM2, uncontrolled, insulin-dependent, with complications - chronic kidney disease, GFR 40 - sees Dr.  Hyman Hopes - peripheral neuropathy - Chronic systolic CHF    Plan:     Patient with continuous improvement in his sugars, very conscientious in keeping a detailed log. He also sends me his sugars through My chart frequently and we have made adjustments in his insulin dosing since his last visit. Two weeks ago we have decreased the Levemir from 32 to 28  units at bedtime, and also NovoLog from 02-23-09 to 6-4-6. His sugars are exemplary (except very few values at 160-170s, usually after food indiscretions), and this is reflected in his new hemoglobin A1c which dropped by 4% since diagnosis. He would really like to try to reduce the intensity of his regimen. - since his sugars are at goal for most of the measurements, I agreed to take him off the NovoLog with breakfast and lunch, and only continue the dinnertime NovoLog for now. - To help with the postprandial sugars after breakfast and lunch, I will start him on Tradjenta, which is not cleared renally, so we can use it at 5 mg daily in a.m. - continue Levemir at the current dose, 28 units nightly  - I will see him back in 3 months - at that time will check a foot exam.  - He will continue to send me his sugars through my chart, we will continue to down size the treatment slowly, especially in the light of his recent fantastic hemoglobin A1c.

## 2012-10-13 ENCOUNTER — Other Ambulatory Visit: Payer: Self-pay | Admitting: Internal Medicine

## 2012-10-22 ENCOUNTER — Other Ambulatory Visit: Payer: Self-pay | Admitting: Cardiology

## 2012-10-29 ENCOUNTER — Ambulatory Visit: Payer: Managed Care, Other (non HMO) | Admitting: Internal Medicine

## 2012-11-03 ENCOUNTER — Telehealth: Payer: Self-pay | Admitting: *Deleted

## 2012-11-03 NOTE — Telephone Encounter (Signed)
Pt called and stated he had numbness on his Left foot on one side. Pt states his bg numbers are fine. This is a new onset of numbness. Spoke with Dr Everardo All he stated if this is a new onset, he needs to go to the ER. Called pt back and advised him to go to the ER. Pt understood.

## 2012-11-07 ENCOUNTER — Encounter (HOSPITAL_COMMUNITY): Payer: Self-pay

## 2012-11-07 ENCOUNTER — Emergency Department (INDEPENDENT_AMBULATORY_CARE_PROVIDER_SITE_OTHER)
Admission: EM | Admit: 2012-11-07 | Discharge: 2012-11-07 | Disposition: A | Discharge status: Home / Self Care | Payer: Self-pay | Source: Home / Self Care | Attending: Family Medicine | Admitting: Family Medicine

## 2012-11-07 DIAGNOSIS — G609 Hereditary and idiopathic neuropathy, unspecified: Secondary | ICD-10-CM

## 2012-11-07 DIAGNOSIS — M79672 Pain in left foot: Secondary | ICD-10-CM

## 2012-11-07 DIAGNOSIS — M79609 Pain in unspecified limb: Secondary | ICD-10-CM

## 2012-11-07 DIAGNOSIS — G629 Polyneuropathy, unspecified: Secondary | ICD-10-CM

## 2012-11-07 MED ORDER — TRAMADOL HCL 50 MG PO TABS: 50.0000 mg | ORAL_TABLET | Freq: Four times a day (QID) | ORAL | Status: DC | PRN

## 2012-11-07 MED ORDER — TRAMADOL HCL 50 MG PO TABS: 50.0000 mg | ORAL_TABLET | Freq: Three times a day (TID) | ORAL | Status: DC | PRN

## 2012-11-07 NOTE — ED Provider Notes (Signed)
History     CSN: [id number]  Arrival date & time 11/07/12  1138   First MD Initiated Contact with Patient 11/07/12 1233      Chief Complaint  Patient presents with  . Foot Pain    (Consider location/radiation/quality/duration/timing/severity/associated sxs/prior treatment) HPI Comments: 52 year old male with history of diabetes diagnosed in February 2014. CKF and peripheral neuropathy. Hemoglobin A1c 9.4 (4 months ago) down to 5.5 (2 months ago). Here complaining of intermittent pain and numbing and crawling sensation in his left foot sole for about 2 weeks. Patient denies extremity weakness. Reports symptoms worse at waking up in the morning. He uses still toe shoes that are rigid soled work and has recently changed to new working shoes. He just started to add a over-the-counter gel soles to his shoes with no significant changes in his symptoms. He has taken leftover pain medications from a prior orthopedic surgery which helped him to sleep at night. Denies recent falls or direct injury to his left foot. Denies problems with balance or gait. State he is following his diabetes treatment and he is diabetes has consistently been improving since it was diagnosed. Wants a "cheap" prescription for pain as he currently does not have medical insurance.      Past Medical History  Diagnosis Date  . Heart failure, chronic systolic   . Non-ischemic cardiomyopathy SECONDARY TO HX UNCONTROLLED HTN AND ETOH ABUSE----  LAST ECHO EF 50%  09-22-2011    a) Echo 03/25/2010: EF 20-25%; mild AI; Mod MR; Mild LAE  b)cardiac cath 03/26/2010: normal cors; severe pulmonary HTN;PCWP 41; EF 10-15%;  c. echo 4/12:  EF 45-50%, mild LVH, grade 1 diast dysfxn, mild AI, mild LAE  . History of ETOH abuse   . Gout, unspecified     stable  per pt  . Pure hyperglyceridemia   . Anemia   . Personal history of colonic polyps-adenoma 02/17/2012    01/2012 - diminutive adenoma Repeat colon about 01/2017    . Heart block AV first  degree   . Hypertension     CARDIOLOGIST-  DR Jens Som--- LOV IN [facility] 09-08-2011  . Chronic renal insufficiency baseline CRE  1.5--1.8    NEPHROLOGIST--  DR WEBB ([facility])  . Nephrolithiasis     RIGHT  . Polycystic kidney disease     BILATERAL RENAL CYST  . Diabetes mellitus type 2, insulin dependent PER pt and LAST PCP NOTE CBG'S IN 100'    RECENTLY UNCONTROLLED AIC 07-07-2012  9.4 AND CBG'S 400'S  . GERD (gastroesophageal reflux disease)   . Arthritis   . CHF (congestive heart failure)     Past Surgical History  Procedure Laterality Date  . Lumbar fusion  08-10-2003    L5  --  S1  . Removal cyst left lung  1994   AT [facility]    benign  . Knee arthroscopy w/ meniscectomy Left 02-06-2011    AND CHONDROPLASTY  . Cardiac catheterization  03-26-2010  DR [place]    SEVERE LV DYSFUNCTION WITH MARKEDLY ELEVATED FILLING PRESSURE/ SEVERE PULMONARY HYPERTENSION/ NORMAL CORONARY ANATOMY  . Transthoracic echocardiogram  09-22-2011  DR CRENSHAW    EF 50%/  MILD LVH/ GRADE I DIASTOLIC DYSFUNCTION/ MILD TR  . Rhinoplasty  1970'S    nose fx  . Cystoscopy with retrograde pyelogram, ureteroscopy and stent placement Right 08/18/2012    Procedure: CYSTOSCOPY WITH RETROGRADE PYELOGRAM, URETEROSCOPY AND STENT PLACEMENT;  Surgeon: Milford Cage, MD;  [facility]: [facility];  Service: Urology;  Laterality: Right;  . Colonoscopy    . Nephrolithotomy Right 09/15/2012    Procedure: NEPHROLITHOTOMY PERCUTANEOUS;  Surgeon: Milford Cage, MD;  Location: [facility];  Service: Urology;  Laterality: Right;  RIGHT PERCUTANEOUS NEPHROSTOLITHOTOMY     Family History  Problem Relation Age of Onset  . Diabetes Mother   . Diabetes Father   . Hypertension Mother   . Hypertension Father   . Colon cancer Neg Hx     History  Substance Use Topics  . Smoking status: Former Smoker -- 0.50 packs/day for 32 years    Types: Cigarettes    Quit date: 02/27/2007  . Smokeless tobacco: Never  Used  . Alcohol Use: No     Comment: HX ALCOHOL ABUSE--  STATES  Quit 02/2010      Review of Systems  Constitutional: Negative for fever and chills.  Endocrine: Negative for polydipsia, polyphagia and polyuria.  Musculoskeletal:       Left foot pain as per HPI  Skin: Negative for rash and wound.  Neurological: Positive for numbness. Negative for dizziness, seizures, facial asymmetry, speech difficulty, weakness and headaches.    Allergies  Metformin and related  Home Medications   Current Outpatient Rx  Name  Route  Sig  Dispense  Refill  . allopurinol (ZYLOPRIM) 300 MG tablet   Oral   Take 300 mg by mouth every morning.          . carvedilol (COREG) 25 MG tablet   Oral   Take 25 mg by mouth 2 (two) times daily with a meal.         . Fenofibric Acid 105 MG TABS   Oral   Take 1 tablet by mouth every morning.         . ferrous sulfate 325 (65 FE) MG tablet   Oral   Take 325 mg by mouth daily with breakfast.         . Fish Oil-Cholecalciferol (FISH OIL + D3) 1200-1000 MG-UNIT CAPS   Oral   Take by mouth.         . linagliptin (TRADJENTA) 5 MG TABS tablet   Oral   Take 1 tablet (5 mg total) by mouth daily.   30 tablet   2   . Multiple Vitamin (MULTIVITAMIN) capsule   Oral   Take 1 capsule by mouth daily.         Marland Kitchen oxyCODONE-acetaminophen (PERCOCET) 5-325 MG per tablet   Oral   Take 1-2 tablets by mouth every 4 (four) hours as needed for pain.   60 tablet   0   . pravastatin (PRAVACHOL) 40 MG tablet      TAKE 1 TABLET BY MOUTH EVERY EVENING   90 tablet   3   . senna-docusate (SENOKOT S) 8.6-50 MG per tablet   Oral   Take 1 tablet by mouth 2 (two) times daily.   60 tablet   0   . spironolactone (ALDACTONE) 25 MG tablet      TAKE 1 TABLET BY MOUTH EVERY DAY   30 tablet   11   . BD PEN NEEDLE NANO U/F 32G X 4 MM MISC               . insulin aspart (NOVOLOG FLEXPEN) 100 UNIT/ML injection      Inject under skin 6 units with  dinner.   1 pen   3   . insulin detemir (LEVEMIR FLEXPEN) 100 UNIT/ML injection   Subcutaneous  Inject 32 Units into the skin at bedtime.         . ONE TOUCH ULTRA TEST test strip               . traMADol (ULTRAM) 50 MG tablet   Oral   Take 1 tablet (50 mg total) by mouth every 8 (eight) hours as needed for pain.   15 tablet   0     BP 137/85  Pulse 83  Temp(Src) 98.3 F (36.8 C) (Oral)  Resp 18  SpO2 96%  Physical Exam  Nursing note and vitals reviewed. Constitutional: He is oriented to person, place, and time. He appears well-developed and well-nourished. No distress.  obese  Cardiovascular: Normal heart sounds.   Pulmonary/Chest: Breath sounds normal.  Musculoskeletal:  Left foot: obvious deformity. No swelling or erythema no skin brakes or wounds. No foot drop. Reported diffused discomfort in lateral and mid sole with palpation, although reported his pain is minimal during exam. No calluses. Superficial sensation appears grossly intact.  Dorsal pedal and tibial posterior pulses are patent.   Neurological: He is alert and oriented to person, place, and time. No cranial nerve deficit.  Skin: He is not diaphoretic.  No foot wounds, ulcers or maceration between toes.    ED Course  Procedures (including critical care time)  Labs Reviewed - No data to display No results found.   1. Peripheral neuropathy   2. Foot pain, left       MDM  Impress peripheral neuropathy vs plantar fascitis although favors first. No clinical findings are is history suggestive of CNS involvement.  Recommended diabetic shoes. Prescribed tramadol. Patient might benefit form a trial with gabapentin but states that he is currently without health insurance and will discuss treatment with his primary care provider once he gets his health insurance back in 90 days. Supportive care and red flags that should prompt his return to medical attention discussed with patient and provided in  writing.       Sharin Grave, MD 11/09/12 [phone]

## 2012-11-07 NOTE — ED Notes (Signed)
C/o left heel pain x one and half week, no injury

## 2012-11-17 ENCOUNTER — Other Ambulatory Visit: Payer: Self-pay | Admitting: Internal Medicine

## 2012-11-29 ENCOUNTER — Telehealth: Payer: Self-pay | Admitting: *Deleted

## 2012-11-29 NOTE — Telephone Encounter (Signed)
[facility] Triage Call Report Triage Record Num: [id number] Operator: Ether Griffins Patient Name: Mike Orozco Call Date & Time: 11/03/2012 4:58:34PM Patient Phone: [phone] PCP: Sanda Linger Patient Gender: Male PCP Fax : Patient DOB: 10/10/1961 Practice Name: [facility] Reason for Call: Caller: Jariel/Patient; PCP: Sanda Linger (Adults only); CB#: [phone]; Calling about left foot numb on left side. Onset 1 week. Has tried OTC powder and soaking it with fair effect. Started new job 2 weeks ago, standing on a concrete floor and has new shoes. Started new diabetes med(Tradjenta) 3 weeks ago and getting off insulin because blood sugars have been good. Guideline: Diabetes:Foot Problems. Disposition: See Provider Within 72 Hours. Reason for Disposition: Gradual onset of changes in sensation AND no previous evaluation. Advised pt to call office in am to schedule an appt within the next few days. Attempted to schedule an appt in [facility] but unable to pull it up. Home care advice given. Protocol(s) Used: Diabetes: Foot Problems Recommended Outcome per Protocol: See Provider within 72 Hours Reason for Outcome: Gradual onset of changes in sensation (numbness, "pins and needle" sensations, decreased perception of heat or cold, hypersensitivity to touch) AND no previous evaluation Care Advice: ~ Call provider if symptoms worsen or new symptoms develop. Do not apply excessive warmth or cold to the area as sensation may be impaired. Do not use a heating pad or hot water. ~ ~ HEALTH PROMOTION / MAINTENANCE ~ CAUTIONS Foot Health: * Wash feet every day and dry thoroughly between toes. * Lubricate dry skin using nongreasy lotion or cream to avoid cracking; do not lubricate between toes. *Check for any irritation, open area, discoloration, note any change in sensation. * Pay special attention to skin between toes and underside of toes/feet. Use mirror to see bottom of feet. * Trim  toenails straight across, do not trim too short or cut down corners. * If your vision is poor, have someone examine your feet and trim your nails. * Have feet examined yearly by a provider. Always have any foot problem examined promptly. ~ Diabetes Action Plan: - Follow recommended action plan and diet - Check blood sugar as recommended - Take diabetic pills or insulin as ordered - Follow action plan for illness. ~ 11/03/2012 5:24:17PM Page 1 of 1 [email or web address]

## 2012-12-06 ENCOUNTER — Ambulatory Visit: Payer: Managed Care, Other (non HMO) | Admitting: Internal Medicine

## 2013-01-21 ENCOUNTER — Ambulatory Visit: Payer: Managed Care, Other (non HMO) | Admitting: Internal Medicine

## 2013-01-21 DIAGNOSIS — Z0289 Encounter for other administrative examinations: Secondary | ICD-10-CM

## 2013-01-27 ENCOUNTER — Ambulatory Visit: Payer: Managed Care, Other (non HMO) | Admitting: Internal Medicine

## 2013-03-04 ENCOUNTER — Ambulatory Visit: Payer: Managed Care, Other (non HMO) | Admitting: Internal Medicine

## 2013-03-21 ENCOUNTER — Ambulatory Visit (INDEPENDENT_AMBULATORY_CARE_PROVIDER_SITE_OTHER): Payer: PRIVATE HEALTH INSURANCE | Admitting: Internal Medicine

## 2013-03-21 ENCOUNTER — Encounter: Payer: Self-pay | Admitting: Internal Medicine

## 2013-03-21 ENCOUNTER — Ambulatory Visit (INDEPENDENT_AMBULATORY_CARE_PROVIDER_SITE_OTHER): Payer: PRIVATE HEALTH INSURANCE

## 2013-03-21 VITALS — BP 122/82 | HR 78 | Temp 97.8°F | Resp 16 | Ht 66.0 in | Wt 200.8 lb

## 2013-03-21 DIAGNOSIS — I10 Essential (primary) hypertension: Secondary | ICD-10-CM

## 2013-03-21 DIAGNOSIS — N189 Chronic kidney disease, unspecified: Secondary | ICD-10-CM

## 2013-03-21 DIAGNOSIS — E1149 Type 2 diabetes mellitus with other diabetic neurological complication: Secondary | ICD-10-CM

## 2013-03-21 DIAGNOSIS — D649 Anemia, unspecified: Secondary | ICD-10-CM

## 2013-03-21 DIAGNOSIS — N289 Disorder of kidney and ureter, unspecified: Secondary | ICD-10-CM

## 2013-03-21 DIAGNOSIS — K439 Ventral hernia without obstruction or gangrene: Secondary | ICD-10-CM

## 2013-03-21 DIAGNOSIS — Z23 Encounter for immunization: Secondary | ICD-10-CM

## 2013-03-21 DIAGNOSIS — E781 Pure hyperglyceridemia: Secondary | ICD-10-CM

## 2013-03-21 LAB — COMPREHENSIVE METABOLIC PANEL
AST: 26 U/L (ref 0–37)
Alkaline Phosphatase: 126 U/L — ABNORMAL HIGH (ref 39–117)
Glucose, Bld: 907 mg/dL (ref 70–99)
Potassium: 4.7 mEq/L (ref 3.5–5.1)
Sodium: 121 mEq/L — CL (ref 135–145)
Total Bilirubin: 0.3 mg/dL (ref 0.3–1.2)
Total Protein: 7.8 g/dL (ref 6.0–8.3)

## 2013-03-21 LAB — HEMOGLOBIN A1C: Hgb A1c MFr Bld: 11 % — ABNORMAL HIGH (ref 4.6–6.5)

## 2013-03-21 LAB — CBC WITH DIFFERENTIAL/PLATELET
Basophils Relative: 0.5 % (ref 0.0–3.0)
Eosinophils Absolute: 0.1 10*3/uL (ref 0.0–0.7)
Eosinophils Relative: 1 % (ref 0.0–5.0)
HCT: 39.5 % (ref 39.0–52.0)
Lymphs Abs: 1.6 10*3/uL (ref 0.7–4.0)
MCHC: 34.4 g/dL (ref 30.0–36.0)
MCV: 89.8 fl (ref 78.0–100.0)
Monocytes Absolute: 0.4 10*3/uL (ref 0.1–1.0)
Platelets: 246 10*3/uL (ref 150.0–400.0)
WBC: 4.9 10*3/uL (ref 4.5–10.5)

## 2013-03-21 NOTE — Patient Instructions (Signed)
Hypertriglyceridemia  Diet for High blood levels of Triglycerides Most fats in food are triglycerides. Triglycerides in your blood are stored as fat in your body. High levels of triglycerides in your blood may put you at a greater risk for heart disease and stroke.  Normal triglyceride levels are less than 150 mg/dL. Borderline high levels are 150-199 mg/dl. High levels are 200 - 499 mg/dL, and very high triglyceride levels are greater than 500 mg/dL. The decision to treat high triglycerides is generally based on the level. For people with borderline or high triglyceride levels, treatment includes weight loss and exercise. Drugs are recommended for people with very high triglyceride levels. Many people who need treatment for high triglyceride levels have metabolic syndrome. This syndrome is a collection of disorders that often include: insulin resistance, high blood pressure, blood clotting problems, high cholesterol and triglycerides. TESTING PROCEDURE FOR TRIGLYCERIDES  You should not eat 4 hours before getting your triglycerides measured. The normal range of triglycerides is between 10 and 250 milligrams per deciliter (mg/dl). Some people may have extreme levels (1000 or above), but your triglyceride level may be too high if it is above 150 mg/dl, depending on what other risk factors you have for heart disease.  People with high blood triglycerides may also have high blood cholesterol levels. If you have high blood cholesterol as well as high blood triglycerides, your risk for heart disease is probably greater than if you only had high triglycerides. High blood cholesterol is one of the main risk factors for heart disease. CHANGING YOUR DIET  Your weight can affect your blood triglyceride level. If you are more than 20% above your ideal body weight, you may be able to lower your blood triglycerides by losing weight. Eating less and exercising regularly is the best way to combat this. Fat provides more  calories than any other food. The best way to lose weight is to eat less fat. Only 30% of your total calories should come from fat. Less than 7% of your diet should come from saturated fat. A diet low in fat and saturated fat is the same as a diet to decrease blood cholesterol. By eating a diet lower in fat, you may lose weight, lower your blood cholesterol, and lower your blood triglyceride level.  Eating a diet low in fat, especially saturated fat, may also help you lower your blood triglyceride level. Ask your dietitian to help you figure how much fat you can eat based on the number of calories your caregiver has prescribed for you.  Exercise, in addition to helping with weight loss may also help lower triglyceride levels.   Alcohol can increase blood triglycerides. You may need to stop drinking alcoholic beverages.  Too much carbohydrate in your diet may also increase your blood triglycerides. Some complex carbohydrates are necessary in your diet. These may include bread, rice, potatoes, other starchy vegetables and cereals.  Reduce "simple" carbohydrates. These may include pure sugars, candy, honey, and jelly without losing other nutrients. If you have the kind of high blood triglycerides that is affected by the amount of carbohydrates in your diet, you will need to eat less sugar and less high-sugar foods. Your caregiver can help you with this.  Adding 2-4 grams of fish oil (EPA+ DHA) may also help lower triglycerides. Speak with your caregiver before adding any supplements to your regimen. Following the Diet  Maintain your ideal weight. Your caregivers can help you with a diet. Generally, eating less food and getting more   exercise will help you lose weight. Joining a weight control group may also help. Ask your caregivers for a good weight control group in your area.  Eat low-fat foods instead of high-fat foods. This can help you lose weight too.  These foods are lower in fat. Eat MORE of these:    Dried beans, peas, and lentils.  Egg whites.  Low-fat cottage cheese.  Fish.  Lean cuts of meat, such as round, sirloin, rump, and flank (cut extra fat off meat you fix).  Whole grain breads, cereals and pasta.  Skim and nonfat dry milk.  Low-fat yogurt.  Poultry without the skin.  Cheese made with skim or part-skim milk, such as mozzarella, parmesan, farmers', ricotta, or pot cheese. These are higher fat foods. Eat LESS of these:   Whole milk and foods made from whole milk, such as American, blue, cheddar, monterey jack, and swiss cheese  High-fat meats, such as luncheon meats, sausages, knockwurst, bratwurst, hot dogs, ribs, corned beef, ground pork, and regular ground beef.  Fried foods. Limit saturated fats in your diet. Substituting unsaturated fat for saturated fat may decrease your blood triglyceride level. You will need to read package labels to know which products contain saturated fats.  These foods are high in saturated fat. Eat LESS of these:   Fried pork skins.  Whole milk.  Skin and fat from poultry.  Palm oil.  Butter.  Shortening.  Cream cheese.  Bacon.  Margarines and baked goods made from listed oils.  Vegetable shortenings.  Chitterlings.  Fat from meats.  Coconut oil.  Palm kernel oil.  Lard.  Cream.  Sour cream.  Fatback.  Coffee whiteners and non-dairy creamers made with these oils.  Cheese made from whole milk. Use unsaturated fats (both polyunsaturated and monounsaturated) moderately. Remember, even though unsaturated fats are better than saturated fats; you still want a diet low in total fat.  These foods are high in unsaturated fat:   Canola oil.  Sunflower oil.  Mayonnaise.  Almonds.  Peanuts.  Pine nuts.  Margarines made with these oils.  Safflower oil.  Olive oil.  Avocados.  Cashews.  Peanut butter.  Sunflower seeds.  Soybean oil.  Peanut  oil.  Olives.  Pecans.  Walnuts.  Pumpkin seeds. Avoid sugar and other high-sugar foods. This will decrease carbohydrates without decreasing other nutrients. Sugar in your food goes rapidly to your blood. When there is excess sugar in your blood, your liver may use it to make more triglycerides. Sugar also contains calories without other important nutrients.  Eat LESS of these:   Sugar, brown sugar, powdered sugar, jam, jelly, preserves, honey, syrup, molasses, pies, candy, cakes, cookies, frosting, pastries, colas, soft drinks, punches, fruit drinks, and regular gelatin.  Avoid alcohol. Alcohol, even more than sugar, may increase blood triglycerides. In addition, alcohol is high in calories and low in nutrients. Ask for sparkling water, or a diet soft drink instead of an alcoholic beverage. Suggestions for planning and preparing meals   Bake, broil, grill or roast meats instead of frying.  Remove fat from meats and skin from poultry before cooking.  Add spices, herbs, lemon juice or vinegar to vegetables instead of salt, rich sauces or gravies.  Use a non-stick skillet without fat or use no-stick sprays.  Cool and refrigerate stews and broth. Then remove the hardened fat floating on the surface before serving.  Refrigerate meat drippings and skim off fat to make low-fat gravies.  Serve more fish.  Use less butter,   margarine and other high-fat spreads on bread or vegetables.  Use skim or reconstituted non-fat dry milk for cooking.  Cook with low-fat cheeses.  Substitute low-fat yogurt or cottage cheese for all or part of the sour cream in recipes for sauces, dips or congealed salads.  Use half yogurt/half mayonnaise in salad recipes.  Substitute evaporated skim milk for cream. Evaporated skim milk or reconstituted non-fat dry milk can be whipped and substituted for whipped cream in certain recipes.  Choose fresh fruits for dessert instead of high-fat foods such as pies or  cakes. Fruits are naturally low in fat. When Dining Out   Order low-fat appetizers such as fruit or vegetable juice, pasta with vegetables or tomato sauce.  Select clear, rather than cream soups.  Ask that dressings and gravies be served on the side. Then use less of them.  Order foods that are baked, broiled, poached, steamed, stir-fried, or roasted.  Ask for margarine instead of butter, and use only a small amount.  Drink sparkling water, unsweetened tea or coffee, or diet soft drinks instead of alcohol or other sweet beverages. QUESTIONS AND ANSWERS ABOUT OTHER FATS IN THE BLOOD: SATURATED FAT, TRANS FAT, AND CHOLESTEROL What is trans fat? Trans fat is a type of fat that is formed when vegetable oil is hardened through a process called hydrogenation. This process helps makes foods more solid, gives them shape, and prolongs their shelf life. Trans fats are also called hydrogenated or partially hydrogenated oils.  What do saturated fat, trans fat, and cholesterol in foods have to do with heart disease? Saturated fat, trans fat, and cholesterol in the diet all raise the level of LDL "bad" cholesterol in the blood. The higher the LDL cholesterol, the greater the risk for coronary heart disease (CHD). Saturated fat and trans fat raise LDL similarly.  What foods contain saturated fat, trans fat, and cholesterol? High amounts of saturated fat are found in animal products, such as fatty cuts of meat, chicken skin, and full-fat dairy products like butter, whole milk, cream, and cheese, and in tropical vegetable oils such as palm, palm kernel, and coconut oil. Trans fat is found in some of the same foods as saturated fat, such as vegetable shortening, some margarines (especially hard or stick margarine), crackers, cookies, baked goods, fried foods, salad dressings, and other processed foods made with partially hydrogenated vegetable oils. Small amounts of trans fat also occur naturally in some animal  products, such as milk products, beef, and lamb. Foods high in cholesterol include liver, other organ meats, egg yolks, shrimp, and full-fat dairy products. How can I use the new food label to make heart-healthy food choices? Check the Nutrition Facts panel of the food label. Choose foods lower in saturated fat, trans fat, and cholesterol. For saturated fat and cholesterol, you can also use the Percent Daily Value (%DV): 5% DV or less is low, and 20% DV or more is high. (There is no %DV for trans fat.) Use the Nutrition Facts panel to choose foods low in saturated fat and cholesterol, and if the trans fat is not listed, read the ingredients and limit products that list shortening or hydrogenated or partially hydrogenated vegetable oil, which tend to be high in trans fat. POINTS TO REMEMBER:   Discuss your risk for heart disease with your caregivers, and take steps to reduce risk factors.  Change your diet. Choose foods that are low in saturated fat, trans fat, and cholesterol.  Add exercise to your daily routine if   it is not already being done. Participate in physical activity of moderate intensity, like brisk walking, for at least 30 minutes on most, and preferably all days of the week. No time? Break the 30 minutes into three, 10-minute segments during the day.  Stop smoking. If you do smoke, contact your caregiver to discuss ways in which they can help you quit.  Do not use street drugs.  Maintain a normal weight.  Maintain a healthy blood pressure.  Keep up with your blood work for checking the fats in your blood as directed by your caregiver. Document Released: 02/21/2004 Document Revised: 11/04/2011 Document Reviewed: 09/18/2008 [employer] Patient Information 2014 [employer].  

## 2013-03-21 NOTE — Assessment & Plan Note (Signed)
He has lost weight I will recheck his lipids today to see if there is improvement

## 2013-03-21 NOTE — Assessment & Plan Note (Signed)
I will check his A1C and will address if needed Will also recheck his renal function

## 2013-03-21 NOTE — Progress Notes (Signed)
Subjective:    Patient ID: Mike Orozco, male    DOB: 10/10/1961, 52 y.o.   MRN: [id number]  Hyperlipidemia This is a chronic problem. The current episode started more than 1 year ago. The problem is uncontrolled. Recent lipid tests were reviewed and are variable. Exacerbating diseases include diabetes and obesity. He has no history of chronic renal disease, hypothyroidism, liver disease or nephrotic syndrome. Factors aggravating his hyperlipidemia include fatty foods. Pertinent negatives include no chest pain, focal sensory loss, focal weakness, leg pain, myalgias or shortness of breath. Current antihyperlipidemic treatment includes fibric acid derivatives and statins. The current treatment provides mild improvement of lipids. Compliance problems include adherence to exercise and adherence to diet.       Review of Systems  Constitutional: Negative.  Negative for fever, chills, diaphoresis, appetite change and fatigue.  HENT: Negative.   Eyes: Negative.   Respiratory: Negative.  Negative for cough, choking, chest tightness, shortness of breath, wheezing and stridor.   Cardiovascular: Negative.  Negative for chest pain, palpitations and leg swelling.  Gastrointestinal: Negative.  Negative for nausea, vomiting, abdominal pain, diarrhea, constipation and blood in stool.  Endocrine: Negative.   Genitourinary: Negative.   Musculoskeletal: Negative.  Negative for arthralgias, back pain, gait problem, joint swelling, myalgias, neck pain and neck stiffness.  Skin: Negative.   Allergic/Immunologic: Negative.   Neurological: Negative.  Negative for dizziness, tremors, focal weakness, syncope, facial asymmetry, weakness, light-headedness and numbness.  Hematological: Negative.  Negative for adenopathy. Does not bruise/bleed easily.  Psychiatric/Behavioral: Negative.        Objective:   Physical Exam  Vitals reviewed. Constitutional: He is oriented to person, place, and time. He appears  well-developed and well-nourished. No distress.  HENT:  Head: Normocephalic and atraumatic.  Mouth/Throat: Oropharynx is clear and moist. No oropharyngeal exudate.  Eyes: Conjunctivae are normal. Right eye exhibits no discharge. Left eye exhibits no discharge. No scleral icterus.  Neck: Normal range of motion. Neck supple. No JVD present. No tracheal deviation present. No thyromegaly present.  Cardiovascular: Normal rate, regular rhythm, normal heart sounds and intact distal pulses.  Exam reveals no gallop and no friction rub.   No murmur heard. Pulmonary/Chest: Effort normal and breath sounds normal. No stridor. No respiratory distress. He has no wheezes. He has no rales. He exhibits no tenderness.  Abdominal: Soft. Normal appearance and bowel sounds are normal. He exhibits no distension and no mass. There is no hepatosplenomegaly, splenomegaly or hepatomegaly. There is no tenderness. There is no rebound, no guarding and no CVA tenderness. A hernia is present. Hernia confirmed positive in the ventral area. Hernia confirmed negative in the right inguinal area and confirmed negative in the left inguinal area.  Musculoskeletal: Normal range of motion. He exhibits no edema and no tenderness.  Lymphadenopathy:    He has no cervical adenopathy.  Neurological: He is oriented to person, place, and time.  Skin: Skin is warm and dry. No rash noted. He is not diaphoretic. No erythema. No pallor.     Lab Results  Component Value Date   WBC 5.2 09/16/2012   HGB 7.5* 09/17/2012   HCT 24.2* 09/17/2012   PLT 276 09/16/2012   GLUCOSE 126* 09/16/2012   CHOL 201* 07/07/2012   TRIG 908.0 Triglyceride is over 400; calculations on Lipids are invalid.* 07/07/2012   HDL 38.20* 07/07/2012   LDLDIRECT 38.6 07/07/2012   LDLCALC 83 05/10/2012   ALT 29 07/07/2012   AST 24 07/07/2012   NA 138 09/16/2012   K  3.9 09/16/2012   CL 104 09/16/2012   CREATININE 1.56* 09/16/2012   BUN 15 09/16/2012   CO2 27 09/16/2012   TSH 1.08 02/23/2012    PSA 0.91 06/17/2010   INR 1.01 09/15/2012   HGBA1C 5.5 09/06/2012       Assessment & Plan:

## 2013-03-21 NOTE — Assessment & Plan Note (Signed)
He wants to know if this can be repaired so I have sent a referral to general surgery

## 2013-03-21 NOTE — Assessment & Plan Note (Signed)
Will recheck his CBC today 

## 2013-03-21 NOTE — Assessment & Plan Note (Signed)
His renal function appears stable

## 2013-03-21 NOTE — Assessment & Plan Note (Signed)
His BP is well controlled Today I will check his lytes and renal function 

## 2013-03-22 ENCOUNTER — Telehealth: Payer: Self-pay | Admitting: *Deleted

## 2013-03-22 ENCOUNTER — Encounter (HOSPITAL_COMMUNITY): Payer: Self-pay | Admitting: Emergency Medicine

## 2013-03-22 ENCOUNTER — Inpatient Hospital Stay (HOSPITAL_COMMUNITY)
Admission: EM | Admit: 2013-03-22 | Discharge: 2013-03-25 | DRG: 638 | Disposition: A | Discharge status: Home / Self Care | Payer: No Typology Code available for payment source | Attending: Internal Medicine | Admitting: Internal Medicine

## 2013-03-22 ENCOUNTER — Telehealth: Payer: Self-pay

## 2013-03-22 ENCOUNTER — Encounter: Payer: Self-pay | Admitting: Internal Medicine

## 2013-03-22 DIAGNOSIS — I509 Heart failure, unspecified: Secondary | ICD-10-CM | POA: Diagnosis present

## 2013-03-22 DIAGNOSIS — I5022 Chronic systolic (congestive) heart failure: Secondary | ICD-10-CM | POA: Diagnosis present

## 2013-03-22 DIAGNOSIS — IMO0001 Reserved for inherently not codable concepts without codable children: Principal | ICD-10-CM | POA: Diagnosis present

## 2013-03-22 DIAGNOSIS — I129 Hypertensive chronic kidney disease with stage 1 through stage 4 chronic kidney disease, or unspecified chronic kidney disease: Secondary | ICD-10-CM | POA: Diagnosis present

## 2013-03-22 DIAGNOSIS — Z87442 Personal history of urinary calculi: Secondary | ICD-10-CM

## 2013-03-22 DIAGNOSIS — Z794 Long term (current) use of insulin: Secondary | ICD-10-CM

## 2013-03-22 DIAGNOSIS — Z87891 Personal history of nicotine dependence: Secondary | ICD-10-CM

## 2013-03-22 DIAGNOSIS — D649 Anemia, unspecified: Secondary | ICD-10-CM | POA: Diagnosis present

## 2013-03-22 DIAGNOSIS — Z8249 Family history of ischemic heart disease and other diseases of the circulatory system: Secondary | ICD-10-CM

## 2013-03-22 DIAGNOSIS — Z833 Family history of diabetes mellitus: Secondary | ICD-10-CM

## 2013-03-22 DIAGNOSIS — R350 Frequency of micturition: Secondary | ICD-10-CM | POA: Diagnosis present

## 2013-03-22 DIAGNOSIS — E1165 Type 2 diabetes mellitus with hyperglycemia: Secondary | ICD-10-CM | POA: Diagnosis present

## 2013-03-22 DIAGNOSIS — E871 Hypo-osmolality and hyponatremia: Secondary | ICD-10-CM | POA: Diagnosis present

## 2013-03-22 DIAGNOSIS — M129 Arthropathy, unspecified: Secondary | ICD-10-CM | POA: Diagnosis present

## 2013-03-22 DIAGNOSIS — K219 Gastro-esophageal reflux disease without esophagitis: Secondary | ICD-10-CM | POA: Diagnosis present

## 2013-03-22 DIAGNOSIS — IMO0002 Reserved for concepts with insufficient information to code with codable children: Secondary | ICD-10-CM | POA: Diagnosis present

## 2013-03-22 DIAGNOSIS — E781 Pure hyperglyceridemia: Secondary | ICD-10-CM | POA: Diagnosis present

## 2013-03-22 DIAGNOSIS — N189 Chronic kidney disease, unspecified: Secondary | ICD-10-CM | POA: Diagnosis present

## 2013-03-22 DIAGNOSIS — I428 Other cardiomyopathies: Secondary | ICD-10-CM | POA: Diagnosis present

## 2013-03-22 DIAGNOSIS — Z981 Arthrodesis status: Secondary | ICD-10-CM

## 2013-03-22 LAB — URINE MICROSCOPIC-ADD ON

## 2013-03-22 LAB — CBC WITH DIFFERENTIAL/PLATELET
Lymphocytes Relative: 35 % (ref 12–46)
Lymphs Abs: 1.7 10*3/uL (ref 0.7–4.0)
Neutro Abs: 2.7 10*3/uL (ref 1.7–7.7)
Neutrophils Relative %: 56 % (ref 43–77)
Platelets: 261 10*3/uL (ref 150–400)
RBC: 4.36 MIL/uL (ref 4.22–5.81)
WBC: 4.8 10*3/uL (ref 4.0–10.5)

## 2013-03-22 LAB — URINALYSIS, ROUTINE W REFLEX MICROSCOPIC
Bilirubin Urine: NEGATIVE
Glucose, UA: >1000 mg/dL — AB
Leukocytes, UA: NEGATIVE
Specific Gravity, Urine: 1.033 — ABNORMAL HIGH (ref 1.005–1.030)
pH: 5.5 (ref 5.0–8.0)

## 2013-03-22 LAB — BLOOD GAS, VENOUS
Bicarbonate: 22.8 mEq/L (ref 20.0–24.0)
Patient temperature: 98.6
TCO2: 24.1 mmol/L (ref 0–100)
pCO2, Ven: 42.7 mmHg — ABNORMAL LOW (ref 45.0–50.0)
pH, Ven: 7.347 — ABNORMAL HIGH (ref 7.250–7.300)
pO2, Ven: 38.7 mmHg (ref 30.0–45.0)

## 2013-03-22 LAB — COMPREHENSIVE METABOLIC PANEL
ALT: 22 U/L (ref 0–53)
AST: 23 U/L (ref 0–37)
Albumin: 4.5 g/dL (ref 3.5–5.2)
Calcium: 10 mg/dL (ref 8.4–10.5)
Creatinine, Ser: 1.67 mg/dL — ABNORMAL HIGH (ref 0.50–1.35)
Sodium: 119 mEq/L — CL (ref 135–145)

## 2013-03-22 LAB — LIPID PANEL: Total CHOL/HDL Ratio: 11

## 2013-03-22 LAB — GLUCOSE, CAPILLARY: Glucose-Capillary: 471 mg/dL — ABNORMAL HIGH (ref 70–99)

## 2013-03-22 MED ORDER — ACETAMINOPHEN 650 MG RE SUPP: 650.0000 mg | Freq: Four times a day (QID) | RECTAL | Status: DC | PRN

## 2013-03-22 MED ORDER — MULTIVITAMINS PO CAPS: 1.0000 | ORAL_CAPSULE | Freq: Every day | ORAL | Status: DC

## 2013-03-22 MED ORDER — SODIUM CHLORIDE 0.9 % IV SOLN: INTRAVENOUS | Status: DC

## 2013-03-22 MED ORDER — INSULIN REGULAR BOLUS VIA INFUSION
0.0000 [IU] | Freq: Three times a day (TID) | INTRAVENOUS | Status: DC
  Administered 2013-03-23: 08:00:00 5.5 [IU] via INTRAVENOUS
  Administered 2013-03-23: 6.3 [IU] via INTRAVENOUS
  Filled 2013-03-22: qty 10

## 2013-03-22 MED ORDER — ONDANSETRON HCL 4 MG PO TABS: 4.0000 mg | ORAL_TABLET | Freq: Four times a day (QID) | ORAL | Status: DC | PRN

## 2013-03-22 MED ORDER — ENOXAPARIN SODIUM 40 MG/0.4ML ~~LOC~~ SOLN
40.0000 mg | SUBCUTANEOUS | Status: DC
Start: 2013-03-22 — End: 2013-03-25
  Administered 2013-03-22 – 2013-03-24 (×3): 40 mg via SUBCUTANEOUS
  Filled 2013-03-22 (×5): qty 0.4

## 2013-03-22 MED ORDER — SODIUM CHLORIDE 0.9 % IJ SOLN
3.0000 mL | Freq: Two times a day (BID) | INTRAMUSCULAR | Status: DC
  Administered 2013-03-23 – 2013-03-25 (×5): 3 mL via INTRAVENOUS

## 2013-03-22 MED ORDER — SODIUM CHLORIDE 0.9 % IV SOLN
INTRAVENOUS | Status: DC
  Administered 2013-03-22: 10 mL/h via INTRAVENOUS

## 2013-03-22 MED ORDER — SODIUM CHLORIDE 0.9 % IV BOLUS (SEPSIS)
1000.0000 mL | Freq: Once | INTRAVENOUS | Status: AC
  Administered 2013-03-22: 1000 mL via INTRAVENOUS

## 2013-03-22 MED ORDER — DEXTROSE 50 % IV SOLN: 25.0000 mL | INTRAVENOUS | Status: DC | PRN

## 2013-03-22 MED ORDER — ACETAMINOPHEN 325 MG PO TABS
650.0000 mg | ORAL_TABLET | Freq: Four times a day (QID) | ORAL | Status: DC | PRN
  Administered 2013-03-23: 650 mg via ORAL
  Filled 2013-03-22: qty 2

## 2013-03-22 MED ORDER — FERROUS SULFATE 325 (65 FE) MG PO TABS
325.0000 mg | ORAL_TABLET | Freq: Every day | ORAL | Status: DC
  Administered 2013-03-23 – 2013-03-25 (×3): 325 mg via ORAL
  Filled 2013-03-22 (×5): qty 1

## 2013-03-22 MED ORDER — FENOFIBRATE 160 MG PO TABS
160.0000 mg | ORAL_TABLET | Freq: Every day | ORAL | Status: DC
  Administered 2013-03-23 – 2013-03-24 (×3): 160 mg via ORAL
  Filled 2013-03-22 (×5): qty 1

## 2013-03-22 MED ORDER — ONDANSETRON HCL 4 MG/2ML IJ SOLN: 4.0000 mg | Freq: Four times a day (QID) | INTRAMUSCULAR | Status: DC | PRN

## 2013-03-22 MED ORDER — ADULT MULTIVITAMIN W/MINERALS CH
1.0000 | ORAL_TABLET | Freq: Every day | ORAL | Status: DC
  Administered 2013-03-23 – 2013-03-24 (×2): 1 via ORAL
  Filled 2013-03-22 (×3): qty 1

## 2013-03-22 MED ORDER — CARVEDILOL 25 MG PO TABS
25.0000 mg | ORAL_TABLET | Freq: Two times a day (BID) | ORAL | Status: DC
  Administered 2013-03-23 – 2013-03-25 (×5): 25 mg via ORAL
  Filled 2013-03-22 (×8): qty 1

## 2013-03-22 MED ORDER — ALLOPURINOL 300 MG PO TABS
300.0000 mg | ORAL_TABLET | Freq: Every morning | ORAL | Status: DC
  Administered 2013-03-23 – 2013-03-25 (×3): 300 mg via ORAL
  Filled 2013-03-22 (×3): qty 1

## 2013-03-22 MED ORDER — SODIUM CHLORIDE 0.9 % IV SOLN
INTRAVENOUS | Status: DC
  Administered 2013-03-22: 4.1 [IU]/h via INTRAVENOUS
  Filled 2013-03-22: qty 1

## 2013-03-22 MED ORDER — INSULIN REGULAR HUMAN 100 UNIT/ML IJ SOLN
INTRAMUSCULAR | Status: DC
  Administered 2013-03-22: 7.5 [IU]/h via INTRAVENOUS
  Administered 2013-03-23: 03:00:00 6.9 [IU]/h via INTRAVENOUS
  Administered 2013-03-23: 2.1 [IU]/h via INTRAVENOUS
  Administered 2013-03-23: 10:00:00 via INTRAVENOUS
  Administered 2013-03-23: 08:00:00 2.3 [IU]/h via INTRAVENOUS
  Filled 2013-03-22 (×3): qty 1

## 2013-03-22 NOTE — ED Notes (Signed)
Pt reports eating a couple of candy bars in addition to strawberry drink.

## 2013-03-22 NOTE — ED Notes (Signed)
Seen Doctor today for follow up after 6 months. States drank a strawberry drink prior to office visit and blood drawn taken. Called patient blood sugar over 900. States does not feel sick.

## 2013-03-22 NOTE — Telephone Encounter (Signed)
[facility] Triage Call Report Triage Record Num: [id number] Operator: Estevan Oaks Patient Name: Mike Orozco Call Date & Time: 03/21/2013 9:09:17PM Patient Phone: [phone] PCP: Sanda Linger Patient Gender: Male PCP Fax : Patient DOB: 10/10/1961 Practice Name: [facility] Reason for Call: F/U call. See calls from today 03/21/13 aat 1838 and 1951. Still no answer. Rn notifies Dr Artist Pais. He requests RN obtain full results from lab and suggests Rn call pt's emergency contact if one available. Pt needs to go to ED. Rn called Caldwell Kronenberger, spouse who is listed as ER contact and left mss it's very important to reach [name]. Called lab but no answer. [facility]. Rn discovered a different number online but no answer there as well. Rn speaks with tech at [facility] and she says it's a lab w/i the [facility] and they may not have anyone on site after hours. Notified Dr Artist Pais who states the office will try to reach pt tomorrow. Protocol(s) Used: Office Note Recommended Outcome per Protocol: Information Noted and Sent to Office Reason for Outcome: Caller information to office Care Advice: ~ 11/03/

## 2013-03-22 NOTE — H&P (Signed)
[facility] History and Physical  Mike Orozco ZOX:[id number] DOB: 10/10/1961 DOA: 03/22/2013  Referring physician: ER physician. PCP: Sanda Linger, MD  Specialists: Endocrinologist.  Chief Complaint: Elevated blood sugar.  HPI: Mike Orozco is a 52 y.o. male was advised by his primary care physician to go to the ER after he was found to have elevated blood sugar in his recent labs drawn. Patient has not been taking his diabetic medications for last 3 months because he felt that his sugars are becoming controlled. 2 days ago he had labs drawn which showed blood sugar around 900 and triglycerides around 3000. Patient was advised to come to the ER. Repeat labs show elevated blood sugar but patient was not in DKA and has been started on IV insulin infusion and has been admitted for further management. Patient otherwise denies any chest pain shortness of breath nausea vomiting abdominal pain.   Review of Systems: As presented in the history of presenting illness, rest negative.  Past Medical History  Diagnosis Date  . Heart failure, chronic systolic   . Non-ischemic cardiomyopathy SECONDARY TO HX UNCONTROLLED HTN AND ETOH ABUSE----  LAST ECHO EF 50%  09-22-2011    a) Echo 03/25/2010: EF 20-25%; mild AI; Mod MR; Mild LAE  b)cardiac cath 03/26/2010: normal cors; severe pulmonary HTN;PCWP 41; EF 10-15%;  c. echo 4/12:  EF 45-50%, mild LVH, grade 1 diast dysfxn, mild AI, mild LAE  . History of ETOH abuse   . Gout, unspecified     stable  per pt  . Pure hyperglyceridemia   . Anemia   . Personal history of colonic polyps-adenoma 02/17/2012    01/2012 - diminutive adenoma Repeat colon about 01/2017    . Heart block AV first degree   . Hypertension     CARDIOLOGIST-  DR Jens Som--- LOV IN [facility] 09-08-2011  . Chronic renal insufficiency baseline CRE  1.5--1.8    NEPHROLOGIST--  DR WEBB ([facility])  . Nephrolithiasis     RIGHT  . Polycystic kidney disease     BILATERAL RENAL CYST  .  Diabetes mellitus type 2, insulin dependent PER pt and LAST PCP NOTE CBG'S IN 100'    RECENTLY UNCONTROLLED AIC 07-07-2012  9.4 AND CBG'S 400'S  . GERD (gastroesophageal reflux disease)   . Arthritis   . CHF (congestive heart failure)    Past Surgical History  Procedure Laterality Date  . Lumbar fusion  08-10-2003    L5  --  S1  . Removal cyst left lung  1994   AT [facility]    benign  . Knee arthroscopy w/ meniscectomy Left 02-06-2011    AND CHONDROPLASTY  . Cardiac catheterization  03-26-2010  DR [place]    SEVERE LV DYSFUNCTION WITH MARKEDLY ELEVATED FILLING PRESSURE/ SEVERE PULMONARY HYPERTENSION/ NORMAL CORONARY ANATOMY  . Transthoracic echocardiogram  09-22-2011  DR CRENSHAW    EF 50%/  MILD LVH/ GRADE I DIASTOLIC DYSFUNCTION/ MILD TR  . Rhinoplasty  1970'S    nose fx  . Cystoscopy with retrograde pyelogram, ureteroscopy and stent placement Right 08/18/2012    Procedure: CYSTOSCOPY WITH RETROGRADE PYELOGRAM, URETEROSCOPY AND STENT PLACEMENT;  Surgeon: Milford Cage, MD;  Location: [facility];  Service: Urology;  Laterality: Right;  . Colonoscopy    . Nephrolithotomy Right 09/15/2012    Procedure: NEPHROLITHOTOMY PERCUTANEOUS;  Surgeon: Milford Cage, MD;  Location: [facility];  Service: Urology;  Laterality: Right;  RIGHT PERCUTANEOUS NEPHROSTOLITHOTOMY    Social History:  reports that he quit  smoking about 6 years ago. His smoking use included Cigarettes. He has a 16 pack-year smoking history. He has never used smokeless tobacco. He reports that he does not drink alcohol or use illicit drugs. Where does patient live home. Can patient participate in ADLs? Yes.  Allergies  Allergen Reactions  . Metformin And Related     Renal issues    Family History:  Family History  Problem Relation Age of Onset  . Diabetes Mother   . Diabetes Father   . Hypertension Mother   . Hypertension Father   . Colon cancer Neg Hx       Prior to Admission medications    Medication Sig Start Date End Date Taking? Authorizing Provider  allopurinol (ZYLOPRIM) 300 MG tablet Take 300 mg by mouth every morning.  05/25/12  Yes Etta Grandchild, MD  carvedilol (COREG) 25 MG tablet Take 25 mg by mouth 2 (two) times daily with a meal.   Yes Historical Provider, MD  ferrous sulfate 325 (65 FE) MG tablet Take 325 mg by mouth daily with breakfast.   Yes Historical Provider, MD  Multiple Vitamin (MULTIVITAMIN) capsule Take 1 capsule by mouth daily.   Yes Historical Provider, MD  pravastatin (PRAVACHOL) 40 MG tablet Take 40 mg by mouth every evening.   Yes Historical Provider, MD  spironolactone (ALDACTONE) 25 MG tablet TAKE 1 TABLET BY MOUTH EVERY DAY 10/22/12  Yes Lewayne Bunting, MD    Physical Exam: Filed Vitals:   03/22/13 2015 03/22/13 2030 03/22/13 2045 03/22/13 2100  BP: 119/83 118/82 118/74   Pulse: 68 66 75 72  Temp:      TempSrc:      Resp:      Height:      Weight:      SpO2: 97% 95% 96% 94%     General:  Well-developed well-nourished.  Eyes: Anicteric no pallor.  ENT: No discharge from ears eyes nose mouth.  Neck: No mass felt.  Cardiovascular: S1-S2 heard.  Respiratory: No rhonchi or crepitations.  Abdomen: Soft nontender bowel sounds present.  Skin: No rash.  Musculoskeletal: No edema.  Psychiatric: Appears normal.  Neurologic: Alert awake oriented to time place and person. Moves all extremities.  Labs on Admission:  Basic Metabolic Panel:  Recent Labs Lab 03/21/13 1558 03/22/13 1738  NA 121* 119*  K 4.7 4.6  CL 85* 82*  CO2 23 21  GLUCOSE 907* 887*  BUN 40* 38*  CREATININE 1.9* 1.67*  CALCIUM 9.8 10.0   Liver Function Tests:  Recent Labs Lab 03/21/13 1558 03/22/13 1738  AST 26 23  ALT 30 22  ALKPHOS 126* 128*  BILITOT 0.3 0.4  PROT 7.8 8.2  ALBUMIN 4.6 4.5   No results found for this basename: LIPASE, AMYLASE,  in the last 168 hours No results found for this basename: AMMONIA,  in the last 168  hours CBC:  Recent Labs Lab 03/21/13 1558 03/22/13 1738  WBC 4.9 4.8  NEUTROABS 2.9 2.7  HGB 13.6 13.4  HCT 39.5 37.8*  MCV 89.8 86.7  PLT 246.0 261   Cardiac Enzymes: No results found for this basename: CKTOTAL, CKMB, CKMBINDEX, TROPONINI,  in the last 168 hours  BNP (last 3 results) No results found for this basename: PROBNP,  in the last 8760 hours CBG:  Recent Labs Lab 03/22/13 1732  GLUCAP >600*    Radiological Exams on Admission: No results found.  Assessment/Plan Principal Problem:   Uncontrolled diabetes mellitus Active Problems:  CARDIOMYOPATHY   Chronic systolic heart failure   Hypertriglyceridemia   Hyponatremia   1. Uncontrolled diabetes mellitus not in DKA - continue with IV insulin infusion until CBG is less than 250 change to Lantus and sliding-scale coverage. Recent hemoglobin A1c was 11 2 days ago. Nutrition consult. Patient advised to be compliant with his medications. 2. Severe hypertriglyceridemia - patient's triglyceride levels are more than 3000. At this time I have placed patient on fenofibrate. We'll hold statins as LDL is less than 10. Recheck lipid panel in a.m. Patient advised about diet and to be compliant with medications. Nutrition consult. 3. Hyponatremia - probably from #1 reason. Closely follow metabolic panel. 4. Chronic systolic heart failure last EF was 15% - closely observe intake output and metabolic panel. 5. Chronic kidney disease - closely follow intake output and metabolic panel.    Code Status: Full code.  Family Communication: None.  Disposition Plan: Admit to inpatient.    Amarion Portell N. [facility] Pager [phone].  If 7PM-7AM, please contact night-coverage [email or web address] Password [facility] 03/22/2013, 9:54 PM

## 2013-03-22 NOTE — Telephone Encounter (Signed)
Result Notes    Notes Recorded by Sandi Mealy, CMA on 03/22/2013 at 12:22 PM Per pt record, call a nurse show that this was addressed last night by Dr. Artist Pais after hours. Thanks (please see notes) ------  Notes Recorded by Etta Grandchild, MD on 03/22/2013 at 10:48 AM [place] - please call Mr. Eshbach and ask him to go to the nearest ER for a blood sugar that is over 900

## 2013-03-22 NOTE — Telephone Encounter (Signed)
Pt returned called, I advised pt of MDs message to go to nearest ER.  Pt acknowledged agreement.

## 2013-03-22 NOTE — Telephone Encounter (Signed)
Called phone number listed on pt chart// no answer and # d/c. Called spouse LMOVM at [phone] stating importance of reporting to nearest ER ASAP.

## 2013-03-22 NOTE — ED Provider Notes (Signed)
CSN: [id number]     Arrival date & time 03/22/13  1714 History   First MD Initiated Contact with Patient 03/22/13 1757     Chief Complaint  Patient presents with  . Abnormal Lab   (Consider location/radiation/quality/duration/timing/severity/associated sxs/prior Treatment) HPI Here for hyperglycemia. Blood sugar over 900 at PCP. Onset was today.  The pain is none. Modifying factors: none. Pt states he does not feel sick and had been going to work.  Associated symptoms: urinary frequency, no fever, no emesis.  Recent medical care: pt went to pcp for routine visit. He admits to not following his blood sugar.   Past Medical History  Diagnosis Date  . Heart failure, chronic systolic   . Non-ischemic cardiomyopathy SECONDARY TO HX UNCONTROLLED HTN AND ETOH ABUSE----  LAST ECHO EF 50%  09-22-2011    a) Echo 03/25/2010: EF 20-25%; mild AI; Mod MR; Mild LAE  b)cardiac cath 03/26/2010: normal cors; severe pulmonary HTN;PCWP 41; EF 10-15%;  c. echo 4/12:  EF 45-50%, mild LVH, grade 1 diast dysfxn, mild AI, mild LAE  . History of ETOH abuse   . Gout, unspecified     stable  per pt  . Pure hyperglyceridemia   . Anemia   . Personal history of colonic polyps-adenoma 02/17/2012    01/2012 - diminutive adenoma Repeat colon about 01/2017    . Heart block AV first degree   . Hypertension     CARDIOLOGIST-  DR Jens Som--- LOV IN [facility] 09-08-2011  . Chronic renal insufficiency baseline CRE  1.5--1.8    NEPHROLOGIST--  DR WEBB ([facility])  . Nephrolithiasis     RIGHT  . Polycystic kidney disease     BILATERAL RENAL CYST  . Diabetes mellitus type 2, insulin dependent PER pt and LAST PCP NOTE CBG'S IN 100'    RECENTLY UNCONTROLLED AIC 07-07-2012  9.4 AND CBG'S 400'S  . GERD (gastroesophageal reflux disease)   . Arthritis   . CHF (congestive heart failure)    Past Surgical History  Procedure Laterality Date  . Lumbar fusion  08-10-2003    L5  --  S1  . Removal cyst left lung  1994   AT [facility]     benign  . Knee arthroscopy w/ meniscectomy Left 02-06-2011    AND CHONDROPLASTY  . Cardiac catheterization  03-26-2010  DR [place]    SEVERE LV DYSFUNCTION WITH MARKEDLY ELEVATED FILLING PRESSURE/ SEVERE PULMONARY HYPERTENSION/ NORMAL CORONARY ANATOMY  . Transthoracic echocardiogram  09-22-2011  DR CRENSHAW    EF 50%/  MILD LVH/ GRADE I DIASTOLIC DYSFUNCTION/ MILD TR  . Rhinoplasty  1970'S    nose fx  . Cystoscopy with retrograde pyelogram, ureteroscopy and stent placement Right 08/18/2012    Procedure: CYSTOSCOPY WITH RETROGRADE PYELOGRAM, URETEROSCOPY AND STENT PLACEMENT;  Surgeon: Milford Cage, MD;  Location: [facility];  Service: Urology;  Laterality: Right;  . Colonoscopy    . Nephrolithotomy Right 09/15/2012    Procedure: NEPHROLITHOTOMY PERCUTANEOUS;  Surgeon: Milford Cage, MD;  Location: [facility];  Service: Urology;  Laterality: Right;  RIGHT PERCUTANEOUS NEPHROSTOLITHOTOMY    Family History  Problem Relation Age of Onset  . Diabetes Mother   . Diabetes Father   . Hypertension Mother   . Hypertension Father   . Colon cancer Neg Hx    History  Substance Use Topics  . Smoking status: Former Smoker -- 0.50 packs/day for 32 years    Types: Cigarettes    Quit date: 02/27/2007  .  Smokeless tobacco: Never Used  . Alcohol Use: No     Comment: HX ALCOHOL ABUSE--  STATES  Quit 02/2010    Review of Systems Constitutional: Negative for fever.  Eyes: Negative for vision loss.  ENT: Negative for difficulty swallowing.  Cardiovascular: Negative for chest pain. Respiratory: Negative for respiratory distress.  Gastrointestinal:  Negative for vomiting.  Genitourinary: Negative for inability to void.  Musculoskeletal: Negative for gait problem.  Integumentary: Negative for rash.  Neurological: Negative for new focal weakness.     Allergies  Metformin and related  Home Medications   No current outpatient prescriptions on file. BP 123/81  Pulse 66   Temp(Src) 98.1 F (36.7 C) (Oral)  Resp 19  Ht 5\' 6"  (1.676 m)  Wt 194 lb 6.4 oz (88.179 kg)  BMI 31.39 kg/m2  SpO2 96% Physical Exam Nursing note and vitals reviewed.  Constitutional: Pt is alert and appears stated age. Eyes: No injection, no scleral icterus. HENT: Atraumatic, airway open without erythema or exudate.  Respiratory: No respiratory distress. Equal breathing bilaterally. Cardiovascular: Normal rate. Extremities warm and well perfused.  Abdomen: Soft, non-tender. MSK: Extremities are atraumatic without deformity. Skin: No rash, no wounds.   Neuro: No motor nor sensory deficit.     ED Course  Procedures (including critical care time) Labs Review Labs Reviewed  CBC WITH DIFFERENTIAL - Abnormal; Notable for the following:    HCT 37.8 (*)    All other components within normal limits  COMPREHENSIVE METABOLIC PANEL - Abnormal; Notable for the following:    Sodium 119 (*)    Chloride 82 (*)    Glucose, Bld 887 (*)    BUN 38 (*)    Creatinine, Ser 1.67 (*)    Alkaline Phosphatase 128 (*)    GFR calc non Af Amer 46 (*)    GFR calc Af Amer 53 (*)    All other components within normal limits  GLUCOSE, CAPILLARY - Abnormal; Notable for the following:    Glucose-Capillary >600 (*)    All other components within normal limits  URINALYSIS, ROUTINE W REFLEX MICROSCOPIC - Abnormal; Notable for the following:    Specific Gravity, Urine 1.033 (*)    Glucose, UA >1000 (*)    Hgb urine dipstick TRACE (*)    All other components within normal limits  BLOOD GAS, VENOUS - Abnormal; Notable for the following:    pH, Ven 7.347 (*)    pCO2, Ven 42.7 (*)    All other components within normal limits  GLUCOSE, CAPILLARY - Abnormal; Notable for the following:    Glucose-Capillary 471 (*)    All other components within normal limits  URINE MICROSCOPIC-ADD ON  LIPID PANEL  TSH  CK  COMPREHENSIVE METABOLIC PANEL  CBC WITH DIFFERENTIAL   Imaging Review No results  found.  EKG Interpretation     Ventricular Rate:    PR Interval:    QRS Duration:   QT Interval:    QTC Calculation:   R Axis:     Text Interpretation:              MDM   1. Uncontrolled diabetes mellitus   2. Chronic renal insufficiency   3. Chronic systolic heart failure   4. Hypertriglyceridemia    52 y.o. male w/ PMHx of DM presents w/ hyperglycemia from PCP with glucose >900. Pt without significant complaint, vitals stable. Labs w/o DKA. Pt given IVF, started on insulin gtt. Pt updated. Called medicine for admission.  I independently viewed, interpreted, and used in my medical decision making all ordered lab and imaging tests. Medical Decision Making discussed with ED attending Dr. Freida Busman.      Charm Barges, MD 03/23/13 [phone]

## 2013-03-22 NOTE — ED Provider Notes (Signed)
I saw and evaluated the patient, reviewed the resident's note and I agree with the findings and plan.  EKG Interpretation   None      Patient here with severe hyperglycemia has been given IV fluids. We'll car admission for medication adjustment and treatment of his hyperglycemia  Toy Baker, MD 03/22/13 2059

## 2013-03-22 NOTE — Telephone Encounter (Signed)
[facility] Triage Call Report Triage Record Num: [id number] Operator: Patriciaann Clan Patient Name: Mike Orozco Call Date & Time: 03/21/2013 6:38:37PM Patient Phone: [phone] PCP: Sanda Linger Patient Gender: Male PCP Fax : Patient DOB: 10/10/1961 Practice Name: [facility] Reason for Call: Caller: Clydie Braun; PCP: Sanda Linger (Adults only); CB#: [phone]; Call regarding Clydie Braun is calling from [facility] regarding a CBC ordered on [name], [name] by Sonda Primes (Adults only).; Clydie Braun is calling from [facility] to report critical lab results on patient. States patient had labwork drawn, per order of Dr. Sanda Linger at 1600 03/21/13. Glucose 907, Sodium 121. States labs were verified. Call placed to patient at [phone]. No answer. Message left on Voice mail for patient to return call to office number. No alternate contact number noted in [facility] Electronic Health Record. Follow up call scheduled to attempt to reach patient for triage assessment. Protocol(s) Used: Office Note Recommended Outcome per Protocol: Information Noted and Sent to Office Reason for Outcome: Caller information to office Care Advice: ~ 11/

## 2013-03-23 ENCOUNTER — Encounter (HOSPITAL_COMMUNITY): Payer: Self-pay | Admitting: General Practice

## 2013-03-23 ENCOUNTER — Encounter: Payer: Self-pay | Admitting: Internal Medicine

## 2013-03-23 LAB — GLUCOSE, CAPILLARY
Glucose-Capillary: 436 mg/dL — ABNORMAL HIGH (ref 70–99)
Glucose-Capillary: >600 mg/dL (ref 70–99)
Glucose-Capillary: 321 mg/dL — ABNORMAL HIGH (ref 70–99)
Glucose-Capillary: 232 mg/dL — ABNORMAL HIGH (ref 70–99)
Glucose-Capillary: 275 mg/dL — ABNORMAL HIGH (ref 70–99)
Glucose-Capillary: 147 mg/dL — ABNORMAL HIGH (ref 70–99)
Glucose-Capillary: 112 mg/dL — ABNORMAL HIGH (ref 70–99)
Glucose-Capillary: 138 mg/dL — ABNORMAL HIGH (ref 70–99)
Glucose-Capillary: 277 mg/dL — ABNORMAL HIGH (ref 70–99)
Glucose-Capillary: 272 mg/dL — ABNORMAL HIGH (ref 70–99)
Glucose-Capillary: 191 mg/dL — ABNORMAL HIGH (ref 70–99)
Glucose-Capillary: 198 mg/dL — ABNORMAL HIGH (ref 70–99)
Glucose-Capillary: 378 mg/dL — ABNORMAL HIGH (ref 70–99)

## 2013-03-23 LAB — CBC WITH DIFFERENTIAL/PLATELET
Basophils Relative: 0 % (ref 0–1)
Eosinophils Absolute: 0.1 10*3/uL (ref 0.0–0.7)
Eosinophils Relative: 2 % (ref 0–5)
HCT: 32.4 % — ABNORMAL LOW (ref 39.0–52.0)
Hemoglobin: 11.4 g/dL — ABNORMAL LOW (ref 13.0–17.0)
MCH: 30.4 pg (ref 26.0–34.0)
MCHC: 35.2 g/dL (ref 30.0–36.0)
Monocytes Absolute: 0.4 10*3/uL (ref 0.1–1.0)
Monocytes Relative: 8 % (ref 3–12)

## 2013-03-23 LAB — COMPREHENSIVE METABOLIC PANEL
ALT: 21 U/L (ref 0–53)
AST: 19 U/L (ref 0–37)
Alkaline Phosphatase: 78 U/L (ref 39–117)
BUN: 23 mg/dL (ref 6–23)
CO2: 24 mEq/L (ref 19–32)
Calcium: 9.3 mg/dL (ref 8.4–10.5)
Chloride: 104 mEq/L (ref 96–112)
Potassium: 3.5 mEq/L (ref 3.5–5.1)
Sodium: 139 mEq/L (ref 135–145)
Total Bilirubin: 0.4 mg/dL (ref 0.3–1.2)
Total Protein: 6.9 g/dL (ref 6.0–8.3)

## 2013-03-23 LAB — LIPID PANEL
Cholesterol: 261 mg/dL — ABNORMAL HIGH (ref 0–200)
VLDL: UNDETERMINED mg/dL (ref 0–40)

## 2013-03-23 LAB — CK: Total CK: 536 U/L — ABNORMAL HIGH (ref 7–232)

## 2013-03-23 MED ORDER — INSULIN ASPART 100 UNIT/ML ~~LOC~~ SOLN
0.0000 [IU] | Freq: Every day | SUBCUTANEOUS | Status: DC
  Administered 2013-03-23: 5 [IU] via SUBCUTANEOUS

## 2013-03-23 MED ORDER — INSULIN ASPART 100 UNIT/ML ~~LOC~~ SOLN
0.0000 [IU] | Freq: Three times a day (TID) | SUBCUTANEOUS | Status: DC
  Administered 2013-03-23: 17:00:00 1 [IU] via SUBCUTANEOUS
  Administered 2013-03-24: 06:00:00 7 [IU] via SUBCUTANEOUS

## 2013-03-23 MED ORDER — BD GETTING STARTED TAKE HOME KIT: 1/2ML X 30G SYRINGES: 1.0000 | Freq: Once | Status: DC

## 2013-03-23 MED ORDER — INSULIN DETEMIR 100 UNIT/ML ~~LOC~~ SOLN
20.0000 [IU] | Freq: Every day | SUBCUTANEOUS | Status: DC
  Administered 2013-03-23: 13:00:00 20 [IU] via SUBCUTANEOUS
  Filled 2013-03-23 (×2): qty 0.2

## 2013-03-23 MED ORDER — "BD GETTING STARTED TAKE HOME KIT: 1ML X 30 G SYRINGES, "
1.0000 | Freq: Once | Status: DC
  Filled 2013-03-23 (×2): qty 1

## 2013-03-23 MED ORDER — LIVING WELL WITH DIABETES BOOK
Freq: Once | Status: AC
  Administered 2013-03-23: 13:00:00
  Filled 2013-03-23: qty 1

## 2013-03-23 NOTE — Plan of Care (Signed)
Problem: Food- and Nutrition-Related Knowledge Deficit (NB-1.1) Goal: Nutrition education Formal process to instruct or train a patient/client in a skill or to impart knowledge to help patients/clients voluntarily manage or modify food choices and eating behavior to maintain or improve health.  Outcome: Completed/Met Date Met:  03/23/13  RD consulted for nutrition education regarding diabetes.     Lab Results  Component Value Date    HGBA1C 11.0* 03/21/2013   Patient previously seen as outpatient [facility]. Last visit was May 2014. He was doing well with dietary changes and exercise with an HgbA1c of 5.5 on 09/06/12. However, he was on unemployment and lost his insurance, stopped taking his medications, and was not monitoring diet. He does still retain previous knowledge, and is committed to making lifestyle changes that were successful in the past.   RD provided Carb counting booklet. Reviewed different food groups and their effects on blood sugar, emphasizing carbohydrate-containing foods. Provided list of carbohydrates and recommended serving sizes of common foods.  Discussed importance of controlled and consistent carbohydrate intake throughout the day. Provided examples of ways to balance meals/snacks and encouraged intake of high-fiber, whole grain complex carbohydrates. Teach back method used.  Expect good compliance.  Body mass index is 31.97 kg/(m^2). Pt meets criteria for obesity, class 1 based on current BMI.  Current diet order is Carbohydrate modified, patient is consuming approximately 100% of meals at this time. Labs and medications reviewed. No further nutrition interventions warranted at this time. RD contact information provided. If additional nutrition issues arise, please re-consult RD.  Linnell Fulling, RD, LDN Pager #: [phone] After-Hours Pager #: [phone]

## 2013-03-23 NOTE — Progress Notes (Signed)
Inpatient Diabetes Program Recommendations  AACE/ADA: New Consensus Statement on Inpatient Glycemic Control (2013)  Target Ranges:  Prepandial:   less than 140 mg/dL      Peak postprandial:   less than 180 mg/dL (1-2 hours)      Critically ill patients:  140 - 180 mg/dL   Reason for Visit: Results for Mike Orozco, Mike Orozco (MRN [id number]) as of 03/23/2013 15:22  Ref. Range 03/23/2013 10:07 03/23/2013 11:08 03/23/2013 12:04 03/23/2013 13:05 03/23/2013 14:07  Glucose-Capillary Latest Range: 70-99 mg/dL 841 (H) 324 (H) 401 (H) 251 (H) 220 (H)   Spoke to patient regarding elevated A1C=11.0% and admission CBG's >900 mg/dL.  Patient admits to not taking care of self since patient lost health insurance.  He now has a new job and new benefits and states "I know what to do".  He has seen Dr. Lafe Garin (endocrinologist) and is an established patient.  He has 2 Levemir pens and 2 Novolog pens at home in his refrigerator.  He knows how to use them and has needles, meter and strips.  States he is looking forward to getting back on track and seeing Dr. Lafe Garin.  Will follow and will be glad to assist if needed.  Gave emotional support and encouraged patient. Thanks, Beryl Meager, RN, BC-ADM Inpatient Diabetes Coordinator Pager [phone]

## 2013-03-23 NOTE — Progress Notes (Signed)
Utilization Review Completed.Fritzi Scripter T1/15/2014   

## 2013-03-23 NOTE — Progress Notes (Signed)
[facility] PROGRESS NOTE  Jermanie Minshall RUE:[id number] DOB: 10/10/1961 DOA: 03/22/2013 PCP: Sanda Linger, MD  HPI: Mike Orozco is a 52 y.o. male was advised by his primary care physician to go to the ER after he was found to have elevated blood sugar in his recent labs drawn. Patient has not been taking his diabetic medications for last 3 months because he felt that his sugars are becoming controlled. 2 days ago he had labs drawn which showed blood sugar around 900 and triglycerides around 3000. Patient was advised to come to the ER. Repeat labs show elevated blood sugar but patient was not in DKA and has been started on IV insulin infusion and has been admitted for further management. Patient otherwise denies any chest pain shortness of breath nausea vomiting abdominal pain.   Assessment/Plan: Uncontrolled diabetes mellitus not in DKA - continue with IV insulin infusion until CBG is less than 250 - sugars better, start detemir 20 U. Discontinue insulin gtt 2 hours after detemir - start SSI - Recent hemoglobin A1c was 11 2 days ago. Nutrition consult. Patient advised to be compliant with his medications.  Severe hypertriglyceridemia - patient's triglyceride levels are more than 3000. At this time I have placed patient on fenofibrate. We'll hold statins as LDL is less than 10. Hyponatremia - probably from #1 reason. Closely follow metabolic panel.  Chronic systolic heart failure initially EF 15% but most recent 50% - closely observe intake output and metabolic panel.  Chronic kidney disease - closely follow intake output and metabolic panel.  Diet: carb  Fluids: none DVT Prophylaxis: Lovenox  Code Status: full Family Communication: none  Disposition Plan: inpatient, likely home in am.  Consultants:  none  Procedures:  none   Antibiotics - none  HPI/Subjective: Feeling well  Objective: Filed Vitals:   03/22/13 2227 03/23/13 0246 03/23/13 0247 03/23/13 0612  BP: 123/81  75/34 113/75 126/77  Pulse: 66 70 69 65  Temp: 98.1 F (36.7 C) 98.6 F (37 C)  98.1 F (36.7 C)  TempSrc: Oral Oral  Oral  Resp: 19 18  20   Height: 5\' 6"  (1.676 m)     Weight: 88.179 kg (194 lb 6.4 oz)   89.8 kg (197 lb 15.6 oz)  SpO2: 96% 94% 95% 96%    Intake/Output Summary (Last 24 hours) at 03/23/13 1352 Last data filed at 03/23/13 1478  Gross per 24 hour  Intake    480 ml  Output    300 ml  Net    180 ml   Filed Weights   03/22/13 1725 03/22/13 2227 03/23/13 0612  Weight: 89.359 kg (197 lb) 88.179 kg (194 lb 6.4 oz) 89.8 kg (197 lb 15.6 oz)    Exam:   General:  NAD  Cardiovascular: regular rate and rhythm, without MRG  Respiratory: good air movement, clear to auscultation throughout, no wheezing, ronchi or rales  Abdomen: soft, not tender to palpation, positive bowel sounds  MSK: no peripheral edema  Neuro: CN 2-12 grossly intact, MS 5/5 in all 4  Data Reviewed: Basic Metabolic Panel:  Recent Labs Lab 03/21/13 1558 03/22/13 1738 03/23/13 0400  NA 121* 119* 139  K 4.7 4.6 3.5  CL 85* 82* 104  CO2 23 21 24   GLUCOSE 907* 887* 201*  BUN 40* 38* 23  CREATININE 1.9* 1.67* 1.23  CALCIUM 9.8 10.0 9.3   Liver Function Tests:  Recent Labs Lab 03/21/13 1558 03/22/13 1738 03/23/13 0400  AST 26 23 19   ALT 30  22 21  ALKPHOS 126* 128* 78  BILITOT 0.3 0.4 0.4  PROT 7.8 8.2 6.9  ALBUMIN 4.6 4.5 3.6   CBC:  Recent Labs Lab 03/21/13 1558 03/22/13 1738 03/23/13 0400  WBC 4.9 4.8 4.5  NEUTROABS 2.9 2.7 2.0  HGB 13.6 13.4 11.4*  HCT 39.5 37.8* 32.4*  MCV 89.8 86.7 86.4  PLT 246.0 261 228   Cardiac Enzymes:  Recent Labs Lab 03/23/13 0400  CKTOTAL 536*   CBG:  Recent Labs Lab 03/23/13 0202 03/23/13 0303 03/23/13 0408 03/23/13 0519 03/23/13 0626  GLUCAP 321* 232* 275* 147* 112*   Studies: No results found.  Scheduled Meds: . allopurinol  300 mg Oral q morning - 10a  . bd getting started take home kit  1 kit Other Once  .  carvedilol  25 mg Oral BID WC  . enoxaparin (LOVENOX) injection  40 mg Subcutaneous Q24H  . fenofibrate  160 mg Oral QHS  . ferrous sulfate  325 mg Oral Q breakfast  . insulin detemir  20 Units Subcutaneous Daily  . insulin regular  0-10 Units Intravenous TID WC  . living well with diabetes book   Does not apply Once  . multivitamin with minerals  1 tablet Oral Daily  . sodium chloride  3 mL Intravenous Q12H   Continuous Infusions: . sodium chloride 10 mL/hr (03/22/13 2320)  . sodium chloride    . insulin (NOVOLIN-R) infusion 2.3 Units/hr (03/23/13 0739)   Principal Problem:   Uncontrolled diabetes mellitus Active Problems:   CARDIOMYOPATHY   Chronic systolic heart failure   Hypertriglyceridemia   Hyponatremia  Time spent: 35  Pamella Pert, MD [facility] Pager [phone]. If 7 PM - 7 AM, please contact night-coverage at [email or web address], password [facility] 03/23/2013, 1:52 PM  LOS: 1 day

## 2013-03-23 NOTE — Progress Notes (Signed)
The patient did not have any acute changes or complaints overnight.  His last two blood sugars fell below the parameters at 147 and 112.  The patient states that he is feeling "good" this morning and feels like he is ready to go home.

## 2013-03-23 NOTE — Progress Notes (Signed)
The patient arrived to 4E30 from the ED.  He was oriented to the unit and placed on telemetry.  VS were taken and the patient was assessed.  He is A&Ox4, independent, and is on a glucostabilizer.  He does not have any complaints of pain.  The call bell was explained and placed within reach.

## 2013-03-24 ENCOUNTER — Other Ambulatory Visit: Payer: Self-pay

## 2013-03-24 LAB — CBC
MCH: 30.8 pg (ref 26.0–34.0)
MCHC: 34.9 g/dL (ref 30.0–36.0)
MCV: 88.2 fL (ref 78.0–100.0)
Platelets: 245 10*3/uL (ref 150–400)
RBC: 4.06 MIL/uL — ABNORMAL LOW (ref 4.22–5.81)
RDW: 12.9 % (ref 11.5–15.5)

## 2013-03-24 LAB — BASIC METABOLIC PANEL
BUN: 16 mg/dL (ref 6–23)
CO2: 22 mEq/L (ref 19–32)
Calcium: 9.3 mg/dL (ref 8.4–10.5)
Creatinine, Ser: 1.1 mg/dL (ref 0.50–1.35)
GFR calc non Af Amer: 75 mL/min — ABNORMAL LOW (ref >90)
Glucose, Bld: 324 mg/dL — ABNORMAL HIGH (ref 70–99)
Potassium: 3.9 mEq/L (ref 3.5–5.1)
Sodium: 135 mEq/L (ref 135–145)

## 2013-03-24 LAB — GLUCOSE, CAPILLARY
Glucose-Capillary: 423 mg/dL — ABNORMAL HIGH (ref 70–99)
Glucose-Capillary: 205 mg/dL — ABNORMAL HIGH (ref 70–99)

## 2013-03-24 MED ORDER — INSULIN DETEMIR 100 UNIT/ML ~~LOC~~ SOLN
28.0000 [IU] | Freq: Every day | SUBCUTANEOUS | Status: DC
  Administered 2013-03-24: 28 [IU] via SUBCUTANEOUS
  Filled 2013-03-24 (×3): qty 0.28

## 2013-03-24 MED ORDER — INSULIN ASPART 100 UNIT/ML ~~LOC~~ SOLN
0.0000 [IU] | Freq: Three times a day (TID) | SUBCUTANEOUS | Status: DC
  Administered 2013-03-25 (×2): 11 [IU] via SUBCUTANEOUS

## 2013-03-24 MED ORDER — INSULIN ASPART 100 UNIT/ML ~~LOC~~ SOLN: 0.0000 [IU] | Freq: Every day | SUBCUTANEOUS | Status: DC

## 2013-03-24 MED ORDER — INSULIN ASPART 100 UNIT/ML ~~LOC~~ SOLN: 0.0000 [IU] | Freq: Three times a day (TID) | SUBCUTANEOUS | Status: DC

## 2013-03-24 MED ORDER — INSULIN ASPART 100 UNIT/ML ~~LOC~~ SOLN
2.0000 [IU] | Freq: Once | SUBCUTANEOUS | Status: AC
  Administered 2013-03-24: 2 [IU] via SUBCUTANEOUS

## 2013-03-24 MED ORDER — INSULIN ASPART 100 UNIT/ML ~~LOC~~ SOLN
0.0000 [IU] | SUBCUTANEOUS | Status: DC
  Administered 2013-03-24 (×2): 20 [IU] via SUBCUTANEOUS

## 2013-03-24 MED ORDER — INSULIN ASPART 100 UNIT/ML ~~LOC~~ SOLN
3.0000 [IU] | Freq: Three times a day (TID) | SUBCUTANEOUS | Status: DC
  Administered 2013-03-24 – 2013-03-25 (×3): 3 [IU] via SUBCUTANEOUS

## 2013-03-24 MED ORDER — LINAGLIPTIN 5 MG PO TABS
5.0000 mg | ORAL_TABLET | Freq: Every day | ORAL | Status: DC
  Filled 2013-03-24: qty 1

## 2013-03-24 NOTE — ED Provider Notes (Signed)
I saw and evaluated the patient, reviewed the resident's note and I agree with the findings and plan.  Toy Baker, MD 03/24/13 Ernestina [place]

## 2013-03-24 NOTE — Progress Notes (Signed)
Pt's BS 416mg /dl.  Dr. Elvera Lennox notified.  Awaiting return call.  Amanda Pea, Charity fundraiser.

## 2013-03-24 NOTE — Progress Notes (Signed)
Patient currently sitting on side of bed eating dinner. Wife and son at the bedside. Currently has no pain or discomfort. Will continue to monitor to end of shift.

## 2013-03-24 NOTE — Progress Notes (Deleted)
Pt had 5 beats of VT and asymptomatic.  Dr Elvera Lennox on the floor made aware.  Spoke with monitor desk and instructed not new VT not new and has had beats greater than 7 beats.  Will continue to monitor.  Amanda Pea, Charity fundraiser.

## 2013-03-24 NOTE — Progress Notes (Signed)
[facility] PROGRESS NOTE  Mike Orozco ZOX:[id number] DOB: 10/10/1961 DOA: 03/22/2013 PCP: Sanda Linger, MD  HPI: Mike Orozco is a 52 y.o. male was advised by his primary care physician to go to the ER after he was found to have elevated blood sugar in his recent labs drawn. Patient has not been taking his diabetic medications for last 3 months because he felt that his sugars are becoming controlled. 2 days ago he had labs drawn which showed blood sugar around 900 and triglycerides around 3000. Patient was advised to come to the ER. Repeat labs show elevated blood sugar but patient was not in DKA and has been started on IV insulin infusion and has been admitted for further management. Patient otherwise denies any chest pain shortness of breath nausea vomiting abdominal pain.   Assessment/Plan: Uncontrolled diabetes mellitus not in DKA - off insulin gtt - detemir 28 U, sugars still poorly controlled. Up to resistant SSI, add mealtime coverage. - Recent hemoglobin A1c was 11 2 days ago. Nutrition consult. Patient advised to be compliant with his medications.  - will need to see his endocrinologist soon Severe hypertriglyceridemia - patient's triglyceride levels are more than 3000. At this time I have placed patient on fenofibrate. We'll hold statins as LDL is less than 10. Hyponatremia - probably from #1 reason. improved Chronic systolic heart failure initially EF 15% but most recent 50% - closely observe intake output and metabolic panel.  - euvolemic Chronic kidney disease - follow intake output and metabolic panel.  Diet: carb  Fluids: none DVT Prophylaxis: Lovenox  Code Status: full Family Communication: none  Disposition Plan: inpatient, likely home 11/7  Consultants:  none  Procedures:  none   Antibiotics - none  HPI/Subjective: Feeling well  Objective: Filed Vitals:   03/23/13 0247 03/23/13 0612 03/23/13 2017 03/24/13 0515  BP: 113/75 126/77 119/80 129/92   Pulse: 69 65 69 74  Temp:  98.1 F (36.7 C) 97.6 F (36.4 C) 98.3 F (36.8 C)  TempSrc:  Oral Oral Oral  Resp:  20 20 18   Height:      Weight:  89.8 kg (197 lb 15.6 oz)  90.5 kg (199 lb 8.3 oz)  SpO2: 95% 96% 96% 94%    Intake/Output Summary (Last 24 hours) at 03/24/13 1149 Last data filed at 03/24/13 0900  Gross per 24 hour  Intake    820 ml  Output      0 ml  Net    820 ml   Filed Weights   03/22/13 2227 03/23/13 0612 03/24/13 0515  Weight: 88.179 kg (194 lb 6.4 oz) 89.8 kg (197 lb 15.6 oz) 90.5 kg (199 lb 8.3 oz)    Exam:   General:  NAD  Cardiovascular: regular rate and rhythm, without MRG  Respiratory: good air movement, clear to auscultation throughout, no wheezing, ronchi or rales  Abdomen: soft, not tender to palpation, positive bowel sounds  MSK: no peripheral edema  Neuro: CN 2-12 grossly intact, MS 5/5 in all 4  Data Reviewed: Basic Metabolic Panel:  Recent Labs Lab 03/21/13 1558 03/22/13 1738 03/23/13 0400 03/24/13 0515  NA 121* 119* 139 135  K 4.7 4.6 3.5 3.9  CL 85* 82* 104 100  CO2 23 21 24 22   GLUCOSE 907* 887* 201* 324*  BUN 40* 38* 23 16  CREATININE 1.9* 1.67* 1.23 1.10  CALCIUM 9.8 10.0 9.3 9.3   Liver Function Tests:  Recent Labs Lab 03/21/13 1558 03/22/13 1738 03/23/13 0400  AST 26  23 19  ALT 30 22 21   ALKPHOS 126* 128* 78  BILITOT 0.3 0.4 0.4  PROT 7.8 8.2 6.9  ALBUMIN 4.6 4.5 3.6   CBC:  Recent Labs Lab 03/21/13 1558 03/22/13 1738 03/23/13 0400 03/24/13 0515  WBC 4.9 4.8 4.5 4.1  NEUTROABS 2.9 2.7 2.0  --   HGB 13.6 13.4 11.4* 12.5*  HCT 39.5 37.8* 32.4* 35.8*  MCV 89.8 86.7 86.4 88.2  PLT 246.0 261 228 245   Cardiac Enzymes:  Recent Labs Lab 03/23/13 0400  CKTOTAL 536*   CBG:  Recent Labs Lab 03/23/13 1651 03/23/13 2109 03/24/13 0620 03/24/13 1125 03/24/13 1126  GLUCAP 127* 378* 336* 423* 416*   Studies: No results found.  Scheduled Meds: . allopurinol  300 mg Oral q morning - 10a  .  bd getting started take home kit  1 kit Other Once  . carvedilol  25 mg Oral BID WC  . enoxaparin (LOVENOX) injection  40 mg Subcutaneous Q24H  . fenofibrate  160 mg Oral QHS  . ferrous sulfate  325 mg Oral Q breakfast  . insulin aspart  0-20 Units Subcutaneous Q4H  . insulin detemir  28 Units Subcutaneous Daily  . linagliptin  5 mg Oral Daily  . multivitamin with minerals  1 tablet Oral Daily  . sodium chloride  3 mL Intravenous Q12H   Continuous Infusions: . sodium chloride 10 mL/hr (03/22/13 2320)  . sodium chloride     Principal Problem:   Uncontrolled diabetes mellitus Active Problems:   CARDIOMYOPATHY   Chronic systolic heart failure   Hypertriglyceridemia   Hyponatremia  Time spent: 25  Pamella Pert, MD [facility] Pager [phone]. If 7 PM - 7 AM, please contact night-coverage at [email or web address], password [facility] 03/24/2013, 11:49 AM  LOS: 2 days

## 2013-03-24 NOTE — Progress Notes (Signed)
Instructed by Dr. Elvera Lennox to give pt 20 units of novolog insulin sq x1. For  BS 416mg /dl.  Pt asymptomatic.  Will continue to monitor.  Amanda Pea, Charity fundraiser.

## 2013-03-25 LAB — GLUCOSE, CAPILLARY
Glucose-Capillary: 264 mg/dL — ABNORMAL HIGH (ref 70–99)
Glucose-Capillary: 255 mg/dL — ABNORMAL HIGH (ref 70–99)

## 2013-03-25 MED ORDER — FENOFIBRATE 160 MG PO TABS: 160.0000 mg | ORAL_TABLET | Freq: Every day | ORAL | Status: DC

## 2013-03-25 MED ORDER — GLUCOSE BLOOD VI STRP: ORAL_STRIP | Status: DC

## 2013-03-25 MED ORDER — INSULIN DETEMIR 100 UNIT/ML ~~LOC~~ SOLN
32.0000 [IU] | Freq: Every day | SUBCUTANEOUS | Status: DC
  Administered 2013-03-25: 32 [IU] via SUBCUTANEOUS
  Filled 2013-03-25: qty 0.32

## 2013-03-25 MED ORDER — INSULIN DETEMIR 100 UNIT/ML ~~LOC~~ SOLN: 32.0000 [IU] | Freq: Every day | SUBCUTANEOUS | Status: DC

## 2013-03-25 MED ORDER — INSULIN ASPART 100 UNIT/ML ~~LOC~~ SOLN: 8.0000 [IU] | Freq: Three times a day (TID) | SUBCUTANEOUS | Status: DC

## 2013-03-25 NOTE — Progress Notes (Signed)
All d/c instructions explained and given to pt.  Verbalized understanding.  Wife at bedside.  Pt wants to have lunch before he is d/c to home.  Amanda Pea, Charity fundraiser.

## 2013-03-25 NOTE — Discharge Instructions (Signed)

## 2013-03-25 NOTE — Progress Notes (Signed)
Pt awake this AM and states he will not be able to go back to sleep. Pt refused to watch diabetic education videos at this time. Will continue to offer. Baron Hamper, RN

## 2013-03-26 ENCOUNTER — Encounter: Payer: Self-pay | Admitting: Internal Medicine

## 2013-03-26 NOTE — Discharge Summary (Signed)
Physician Discharge Summary  Mike Orozco RUE:[id number] DOB: 10/10/1961 DOA: 03/22/2013  PCP: Sanda Linger, MD  Admit date: 03/22/2013 Discharge date: 03/26/2013  Time spent: 35 minutes  Recommendations for Outpatient Follow-up:  1. Follow up with your PCP in 1 week 2. Follow up with Endocrinology in 1 week as scheduled   Recommendations for primary care physician for things to follow:  - sugar log  Discharge Diagnoses:  Principal Problem:   Uncontrolled diabetes mellitus Active Problems:   CARDIOMYOPATHY   Chronic systolic heart failure   Hypertriglyceridemia   Hyponatremia  Discharge Condition: stable  Diet recommendation: diabetic  Filed Weights   03/23/13 0612 03/24/13 0515 03/25/13 0417  Weight: 89.8 kg (197 lb 15.6 oz) 90.5 kg (199 lb 8.3 oz) 90.084 kg (198 lb 9.6 oz)   History of present illness:  Mike Orozco is a 52 y.o. male was advised by his primary care physician to go to the ER after he was found to have elevated blood sugar in his recent labs drawn. Patient has not been taking his diabetic medications for last 3 months because he felt that his sugars are becoming controlled. 2 days ago he had labs drawn which showed blood sugar around 900 and triglycerides around 3000. Patient was advised to come to the ER. Repeat labs show elevated blood sugar but patient was not in DKA and has been started on IV insulin infusion and has been admitted for further management. Patient otherwise denies any chest pain shortness of breath nausea vomiting abdominal pain.   Hospital Course:  Uncontrolled diabetes mellitus not in DKA - initially on insulin drip, transitioned to Levemir. His dose has been up-titrated to 32 U per fasting sugars and was discharged on that and also 8 U of short acting insulin with meals. Patient has excellent understanding of his diabetes and will monitor his sugars 4 times daily, create a log and follow up with his endocrinologist as scheduled below.  Severe  hypertriglyceridemia - patient's triglyceride levels are more than 3000. At this time I have placed patient on fenofibrate. Hyponatremia - probably from #1 reason. Improved. Chronic systolic heart failure initially EF 15% but most recent 50% - closely observe intake output and metabolic panel.  - euvolemic on exam, no issues during his hospitalization.  Chronic kidney disease - creatinine 1.1 on discharge  Procedures:  none   Consultations:  none  Discharge Exam: Filed Vitals:   03/24/13 1744 03/24/13 2056 03/25/13 0417 03/25/13 0604  BP: 129/88 122/92 123/90 108/78  Pulse: 70 66 65 65  Temp:  98 F (36.7 C) 98 F (36.7 C)   TempSrc:  Oral Oral   Resp:  18 18   Height:      Weight:   90.084 kg (198 lb 9.6 oz)   SpO2:  96% 96%    General: NAD Cardiovascular: RRR Respiratory: CTA biL  Discharge Instructions   Future Appointments Provider Department Dept Phone   03/29/2013 2:50 PM Axel Filler, MD [facility], [place] [phone]   04/01/2013 2:45 PM Etta Grandchild, MD [facility] [place] [phone]   04/04/2013 11:00 AM Carlus Pavlov, MD [facility] [phone]       Medication List         allopurinol 300 MG tablet  Commonly known as:  ZYLOPRIM  Take 300 mg by mouth every morning.     carvedilol 25 MG tablet  Commonly known as:  COREG  Take 25 mg by mouth 2 (two) times  daily with a meal.     fenofibrate 160 MG tablet  Take 1 tablet (160 mg total) by mouth at bedtime.     ferrous sulfate 325 (65 FE) MG tablet  Take 325 mg by mouth daily with breakfast.     glucose blood test strip  Use as instructed     insulin aspart 100 UNIT/ML injection  Commonly known as:  novoLOG  Inject 8 Units into the skin 3 (three) times daily with meals.     insulin detemir 100 UNIT/ML injection  Commonly known as:  LEVEMIR  Inject 0.32 mLs (32 Units total) into the skin daily.     multivitamin capsule  Take 1  capsule by mouth daily.     pravastatin 40 MG tablet  Commonly known as:  PRAVACHOL  Take 40 mg by mouth every evening.     spironolactone 25 MG tablet  Commonly known as:  ALDACTONE  TAKE 1 TABLET BY MOUTH EVERY DAY           Follow-up Information   Follow up with Sanda Linger, MD On 04/01/2013. (@ 10:30 AM)    Specialty:  Internal Medicine   Contact information:   [address] [place] [address] [phone]       Follow up with Carlus Pavlov, MD On 04/04/2013. (@ 11:00 AM)    Specialty:  Internal Medicine   Contact information:   [address] [place] [address] [phone]      The results of significant diagnostics from this hospitalization (including imaging, microbiology, ancillary and laboratory) are listed below for reference.    Labs: Basic Metabolic Panel:  Recent Labs Lab 03/21/13 1558 03/22/13 1738 03/23/13 0400 03/24/13 0515  NA 121* 119* 139 135  K 4.7 4.6 3.5 3.9  CL 85* 82* 104 100  CO2 23 21 24 22   GLUCOSE 907* 887* 201* 324*  BUN 40* 38* 23 16  CREATININE 1.9* 1.67* 1.23 1.10  CALCIUM 9.8 10.0 9.3 9.3   Liver Function Tests:  Recent Labs Lab 03/21/13 1558 03/22/13 1738 03/23/13 0400  AST 26 23 19   ALT 30 22 21   ALKPHOS 126* 128* 78  BILITOT 0.3 0.4 0.4  PROT 7.8 8.2 6.9  ALBUMIN 4.6 4.5 3.6   CBC:  Recent Labs Lab 03/21/13 1558 03/22/13 1738 03/23/13 0400 03/24/13 0515  WBC 4.9 4.8 4.5 4.1  NEUTROABS 2.9 2.7 2.0  --   HGB 13.6 13.4 11.4* 12.5*  HCT 39.5 37.8* 32.4* 35.8*  MCV 89.8 86.7 86.4 88.2  PLT 246.0 261 228 245   Cardiac Enzymes:  Recent Labs Lab 03/23/13 0400  CKTOTAL 536*   CBG:  Recent Labs Lab 03/24/13 1126 03/24/13 1550 03/24/13 2058 03/25/13 0553 03/25/13 1159  GLUCAP 416* 364* 205* 264* 255*   Signed:  Alexie Lanni  [facility] 03/26/2013, 2:52 PM

## 2013-03-29 ENCOUNTER — Ambulatory Visit (INDEPENDENT_AMBULATORY_CARE_PROVIDER_SITE_OTHER): Payer: PRIVATE HEALTH INSURANCE | Admitting: General Surgery

## 2013-04-01 ENCOUNTER — Ambulatory Visit: Payer: PRIVATE HEALTH INSURANCE | Admitting: Internal Medicine

## 2013-04-01 ENCOUNTER — Ambulatory Visit (INDEPENDENT_AMBULATORY_CARE_PROVIDER_SITE_OTHER): Payer: PRIVATE HEALTH INSURANCE | Admitting: Internal Medicine

## 2013-04-01 ENCOUNTER — Encounter: Payer: Self-pay | Admitting: Internal Medicine

## 2013-04-01 VITALS — BP 120/80 | HR 75 | Temp 97.4°F | Resp 10 | Wt 204.0 lb

## 2013-04-01 DIAGNOSIS — E1149 Type 2 diabetes mellitus with other diabetic neurological complication: Secondary | ICD-10-CM

## 2013-04-01 MED ORDER — INSULIN DETEMIR 100 UNIT/ML ~~LOC~~ SOLN: 32.0000 [IU] | Freq: Every day | SUBCUTANEOUS | Status: DC

## 2013-04-01 MED ORDER — INSULIN ASPART 100 UNIT/ML ~~LOC~~ SOLN: SUBCUTANEOUS | Status: DC

## 2013-04-01 NOTE — Patient Instructions (Signed)
Continue Levemir 32 units at bedtime. Take 8 units before a regular meal and 11 units before a large meal.  Please return in 1 month with your sugar log.

## 2013-04-01 NOTE — Progress Notes (Signed)
Subjective:     Patient ID: Mike Orozco, male   DOB: 10/10/1961, 52 y.o.   MRN: [id number]  HPI Mike Orozco is a pleasant 52 year old man, returning for f/u for DM2, uncontrolled, insulin-dependent, with complications (chronic kidney disease - GFR 40, peripheral neuropathy, systolic CHF). Last visit 6 mo ago - he missed last appt.  His last hemoglobin A1c was: Lab Results  Component Value Date   HGBA1C 11.0* 03/21/2013   HGBA1C 5.5 09/06/2012   HGBA1C 9.4* 07/07/2012   After his hemoglobin A1c returned at 5.5%, he stopped all his diabetes meds, and soon after, he stopped checking his sugars, too, as they were at goal. He ended up in the hospital with sugars in the 900s. He was d/c on 03/25/13.   He was on a regimen of: - Levemir 28 units now  - Humalog 6 with dinner - Tradjenta 5 mg daily  He is now on: - Levemir 32 units  - Novolog 8 units  tid  He checks his sugars 6 times a day now, before meals and at bedtime (brings his log) >> sugars decreasing from 300s at d/c to 200's and now high 100s. Reviewed the ones from last visit: - Before breakfast: 98-148, with most sugars between 110 and 120 >> now 91-120 - 2 hours after breakfast 125-149 >> not checking - Before lunch 75-187 >> 77- 140 - 2 hours after lunch 107-201, these being his higher sugars >> not checking - Before dinner 86-157, with 1 low at 63 (only had peanuts for lunch) and 1 high at 198 >> now 91-140, 2 x 170 - 2 hours after dinner 100-150, with one high at 209 (after the 63 above, did not bolus) >> not checking - bedtime 100-140 >> now same as after dinner   He is returning to work soon, working 3 am to 3 pm.  He has chronic kidney disease, with the last BUN/creatinine: Lab Results  Component Value Date   BUN 16 03/24/2013   Lab Results  Component Value Date   CREATININE 1.10 03/24/2013   His last lipid panel: Lab Results  Component Value Date   CHOL 261* 03/23/2013   HDL NOT REPORTED DUE TO HIGH TRIGLYCERIDES  03/23/2013   LDLCALC UNABLE TO CALCULATE IF TRIGLYCERIDE OVER 400 mg/dL 40/01/8118   LDLDIRECT 8.3 03/21/2013   TRIG 1618* 03/23/2013   CHOLHDL NOT REPORTED DUE TO HIGH TRIGLYCERIDES 03/23/2013  Last eye exam: no DR, last year.  No numbness and tingling in legs.   The patient also has a history of anxiety, hypertension, hyperlipidemia, nonischemic cardiomyopathy, gout, anemia, kidney stones (sees urology), tobacco and alcohol abuse.   Review of Systems Constitutional: no weight gain/loss, no fatigue, no subjective hyperthermia/hypothermia Eyes: no blurry vision, no xerophthalmia ENT: no sore throat, no nodules palpated in throat, no dysphagia/odynophagia, no hoarseness Cardiovascular: no CP/SOB/palpitations/leg swelling Respiratory: no cough/SOB Gastrointestinal: no N/V/D/C Musculoskeletal: no muscle/joint aches Skin: no rashes Neurological: no tremors/numbness/tingling/dizziness Psychiatric: no depression/anxiety  I reviewed pt's medications, allergies, PMH, social hx, family hx and no changes required.    Objective:   Physical Exam BP 120/80  Pulse 75  Temp(Src) 97.4 F (36.3 C) (Oral)  Resp 10  Wt 204 lb (92.534 kg)  SpO2 96% Wt Readings from Last 3 Encounters:  03/25/13 198 lb 9.6 oz (90.084 kg)  03/21/13 200 lb 12 oz (91.06 kg)  10/07/12 207 lb (93.895 kg)  Constitutional: overweight, in NAD Eyes: PERRLA, EOMI, no exophthalmos ENT: moist mucous membranes, no thyromegaly, no  cervical lymphadenopathy Cardiovascular: RRR, No MRG Respiratory: CTA B Gastrointestinal: abdomen soft, NT, ND, BS+ Musculoskeletal: no deformities, strength intact in all 4 Skin: moist, warm, no rashes  Assessment:     1. DM2, uncontrolled, insulin-dependent, with complications - chronic kidney disease, GFR 40 - sees Dr. Hyman Hopes - peripheral neuropathy - Chronic systolic CHF    Plan:     Patient was in a wonderful job in controlling his diabetes before last visit, however, since he switched  insurances, he tried to stop all his diabetes medications and not check sugars >> not surprisingly, his sugars increased greatly and he had to be admitted for this. He is still glucotoxic, but blood sugars are continuously improving on his new insulin regimen. I believe that we'll need to decrease the doses of his insulins when glucotoxicity resolves, so I advised him to check his sugars frequently and send me the sugars through my chart, as she was doing before. He does need a little bit more insulin with dinner for now, so I advised him to take 11 units if he has a large meal. The insulin is likely helping his TG level, too.  Patient Instructions  Continue Levemir 32 units at bedtime. Take 8 units before a regular meal and 11 units before a large meal.  - after he gets over this episode of clinical toxicity, we probably can restart Tradjenta back, while keeping Levemir and probably NovoLog with dinner - I will see him back in 1 month - at that time will check a foot exam.  - He will continue to send me his sugars through my chart, we will continue to down size the treatment slowly

## 2013-04-04 ENCOUNTER — Ambulatory Visit: Payer: PRIVATE HEALTH INSURANCE | Admitting: Internal Medicine

## 2013-04-04 ENCOUNTER — Encounter: Payer: Self-pay | Admitting: Internal Medicine

## 2013-04-04 ENCOUNTER — Ambulatory Visit (INDEPENDENT_AMBULATORY_CARE_PROVIDER_SITE_OTHER): Payer: PRIVATE HEALTH INSURANCE | Admitting: Internal Medicine

## 2013-04-04 VITALS — BP 126/80 | HR 73 | Temp 97.9°F | Resp 16 | Wt 205.6 lb

## 2013-04-04 DIAGNOSIS — N6310 Unspecified lump in the right breast, unspecified quadrant: Secondary | ICD-10-CM | POA: Insufficient documentation

## 2013-04-04 DIAGNOSIS — E781 Pure hyperglyceridemia: Secondary | ICD-10-CM

## 2013-04-04 DIAGNOSIS — N63 Unspecified lump in unspecified breast: Secondary | ICD-10-CM

## 2013-04-04 DIAGNOSIS — E1149 Type 2 diabetes mellitus with other diabetic neurological complication: Secondary | ICD-10-CM

## 2013-04-04 NOTE — Patient Instructions (Signed)
Breast Tenderness Breast tenderness is a common problem for women of all ages. Breast tenderness may cause mild discomfort to severe pain. It has a variety of causes. Your health care provider will find out the likely cause of your breast tenderness by examining your breasts, asking you about symptoms, and ordering some tests. Breast tenderness usually does not mean you have breast cancer. HOME CARE INSTRUCTIONS  Breast tenderness often can be handled at home. You can try:  Getting fitted for a new bra that provides more support, especially during exercise.  Wearing a more supportive bra or sports bra while sleeping when your breasts are very tender.  If you have a breast injury, apply ice to the area:  Put ice in a plastic bag.  Place a towel between your skin and the bag.  Leave the ice on for 20 minutes, 2 3 times a day.  If your breasts are too full of milk as a result of breastfeeding, try:  Expressing milk either by hand or with a breast pump.  Applying a warm compress to the breasts for relief.  Taking over-the-counter pain relievers, if approved by your health care provider.  Taking other medicines that your health care provider prescribes. These may include antibiotic medicines or birth control pills. Over the long term, your breast tenderness might be eased if you:  Cut down on caffeine.  Reduce the amount of fat in your diet. Keep a log of the days and times when your breasts are most tender. This will help you and your health care provider find the cause of the tenderness and how to relieve it. Also, learn how to do breast exams at home. This will help you notice if you have an unusual growth or lump that could cause tenderness. SEEK MEDICAL CARE IF:   Any part of your breast is hard, red, and hot to the touch. This could be a sign of infection.  Fluid is coming out of your nipples (and you are not breastfeeding). Especially watch for blood or pus.  You have a fever  as well as breast tenderness.  You have a new or painful lump in your breast that remains after your menstrual period ends.  You have tried to take care of the pain at home, but it has not gone away.  Your breast pain is getting worse, or the pain is making it hard to do the things you usually do during your day. Document Released: 04/17/2008 Document Revised: 01/05/2013 Document Reviewed: 12/02/2012 [employer] Patient Information 2014 [employer].  

## 2013-04-04 NOTE — Progress Notes (Signed)
  Subjective:    Patient ID: Mike Orozco, male    DOB: 10/10/1961, 52 y.o.   MRN: [id number]  HPI Comments: He returns for follow up after an admission for severe hyperglycemia and hypertriglyceridemia. He has started using insulin and he tells me that he is doing well on his regimen. Today he complains that there is a small nodule over his right breast.     Review of Systems  Constitutional: Negative.  Negative for fever, chills, diaphoresis, appetite change and fatigue.  HENT: Negative.   Eyes: Negative.   Respiratory: Negative.  Negative for cough, choking, chest tightness, shortness of breath, wheezing and stridor.   Cardiovascular: Negative.  Negative for chest pain, palpitations and leg swelling.  Gastrointestinal: Negative.  Negative for nausea, vomiting, abdominal pain, diarrhea, constipation and blood in stool.  Endocrine: Negative.  Negative for polydipsia, polyphagia and polyuria.  Genitourinary: Negative.   Musculoskeletal: Negative.   Skin: Negative.   Allergic/Immunologic: Negative.   Neurological: Negative.  Negative for dizziness, tremors, seizures, weakness, light-headedness, numbness and headaches.  Hematological: Negative.  Negative for adenopathy. Does not bruise/bleed easily.  Psychiatric/Behavioral: Negative.        Objective:   Physical Exam  Vitals reviewed. Constitutional: He is oriented to person, place, and time. He appears well-developed and well-nourished. No distress.  HENT:  Head: Normocephalic and atraumatic.  Mouth/Throat: Oropharynx is clear and moist. No oropharyngeal exudate.  Eyes: Conjunctivae are normal. Right eye exhibits no discharge. Left eye exhibits no discharge. No scleral icterus.  Neck: Normal range of motion. Neck supple. No JVD present. No tracheal deviation present. No thyromegaly present.  Cardiovascular: Normal rate, regular rhythm, normal heart sounds and intact distal pulses.  Exam reveals no gallop and no friction rub.   No  murmur heard. Pulmonary/Chest: Effort normal and breath sounds normal. No stridor. No respiratory distress. He has no wheezes. He has no rales. He exhibits no tenderness.    Abdominal: Soft. Bowel sounds are normal. He exhibits no distension and no mass. There is no tenderness. There is no rebound and no guarding.  Musculoskeletal: Normal range of motion. He exhibits no edema and no tenderness.  Lymphadenopathy:    He has no cervical adenopathy.  Neurological: He is oriented to person, place, and time.  Skin: Skin is warm and dry. No rash noted. He is not diaphoretic. No erythema. No pallor.     Lab Results  Component Value Date   WBC 4.1 03/24/2013   HGB 12.5* 03/24/2013   HCT 35.8* 03/24/2013   PLT 245 03/24/2013   GLUCOSE 324* 03/24/2013   CHOL 261* 03/23/2013   TRIG 1618* 03/23/2013   HDL NOT REPORTED DUE TO HIGH TRIGLYCERIDES 03/23/2013   LDLDIRECT 8.3 03/21/2013   LDLCALC UNABLE TO CALCULATE IF TRIGLYCERIDE OVER 400 mg/dL 13/0/8657   ALT 21 84/10/9627   AST 19 03/23/2013   NA 135 03/24/2013   K 3.9 03/24/2013   CL 100 03/24/2013   CREATININE 1.10 03/24/2013   BUN 16 03/24/2013   CO2 22 03/24/2013   TSH 1.624 03/23/2013   PSA 0.91 06/17/2010   INR 1.01 09/15/2012   HGBA1C 11.0* 03/21/2013       Assessment & Plan:

## 2013-04-04 NOTE — Progress Notes (Signed)
Pre visit review using our clinic review tool, if applicable. No additional management support is needed unless otherwise documented below in the visit note. 

## 2013-04-10 ENCOUNTER — Encounter: Payer: Self-pay | Admitting: Internal Medicine

## 2013-04-10 NOTE — Assessment & Plan Note (Signed)
Improvement noted 

## 2013-04-10 NOTE — Assessment & Plan Note (Signed)
I have asked him to get an U/S done to see if this is suspicious for malignancy

## 2013-04-11 ENCOUNTER — Ambulatory Visit: Payer: PRIVATE HEALTH INSURANCE | Admitting: Internal Medicine

## 2013-04-19 ENCOUNTER — Ambulatory Visit (INDEPENDENT_AMBULATORY_CARE_PROVIDER_SITE_OTHER): Payer: PRIVATE HEALTH INSURANCE | Admitting: General Surgery

## 2013-04-26 ENCOUNTER — Encounter (INDEPENDENT_AMBULATORY_CARE_PROVIDER_SITE_OTHER): Payer: Self-pay | Admitting: General Surgery

## 2013-04-26 ENCOUNTER — Ambulatory Visit (INDEPENDENT_AMBULATORY_CARE_PROVIDER_SITE_OTHER): Payer: PRIVATE HEALTH INSURANCE | Admitting: General Surgery

## 2013-04-26 VITALS — BP 132/80 | HR 68 | Temp 98.0°F | Resp 18 | Ht 68.0 in | Wt 206.0 lb

## 2013-04-26 DIAGNOSIS — M6208 Separation of muscle (nontraumatic), other site: Secondary | ICD-10-CM

## 2013-04-26 DIAGNOSIS — M6200 Separation of muscle (nontraumatic), unspecified site: Secondary | ICD-10-CM

## 2013-04-26 NOTE — Progress Notes (Signed)
Subjective:     Patient ID: Harel Repetto, male   DOB: 10/10/1961, 52 y.o.   MRN: [id number]  HPI This is a 52 year old male who presents for evaluation of a ventral hernia. Peristalses noticed a bulge in his midline for approximately a year. He does complain of some cramps in his left upper quadrant area.  Review of Systems  Constitutional: Negative.   HENT: Negative.   Eyes: Negative.   Respiratory: Negative.   Cardiovascular: Negative.   Gastrointestinal: Negative.   Endocrine: Negative.   Neurological: Negative.   All other systems reviewed and are negative.       Objective:   Physical Exam  Constitutional: He is oriented to person, place, and time. He appears well-developed and well-nourished.  HENT:  Head: Normocephalic and atraumatic.  Eyes: Conjunctivae and EOM are normal. Pupils are equal, round, and reactive to light.  Neck: Normal range of motion. Neck supple.  Cardiovascular: Normal rate, regular rhythm and normal heart sounds.   Pulmonary/Chest: Effort normal and breath sounds normal.  Abdominal:    Musculoskeletal: Normal range of motion.  Neurological: He is alert and oriented to person, place, and time.  Skin: Skin is warm and dry.       Assessment:     [age]-year-old male with rectus diastases     Plan:     1. I do not recommend surgery at this time the rectus diastases. 2. Patient follow up as needed

## 2013-05-06 ENCOUNTER — Encounter: Payer: Self-pay | Admitting: Internal Medicine

## 2013-05-06 ENCOUNTER — Ambulatory Visit (INDEPENDENT_AMBULATORY_CARE_PROVIDER_SITE_OTHER): Payer: PRIVATE HEALTH INSURANCE | Admitting: Internal Medicine

## 2013-05-06 VITALS — BP 118/72 | HR 69 | Temp 98.2°F | Resp 12 | Wt 206.9 lb

## 2013-05-06 DIAGNOSIS — E1149 Type 2 diabetes mellitus with other diabetic neurological complication: Secondary | ICD-10-CM

## 2013-05-06 MED ORDER — LINAGLIPTIN 5 MG PO TABS: 5.0000 mg | ORAL_TABLET | Freq: Every day | ORAL | Status: DC

## 2013-05-06 NOTE — Progress Notes (Signed)
Subjective:     Patient ID: Mike Orozco, male   DOB: 10/10/1961, 52 y.o.   MRN: [id number]  HPI [name] is a pleasant 52 y.o. man, returning for f/u for DM2, uncontrolled, insulin-dependent, with complications (chronic kidney disease - GFR 40, peripheral neuropathy, systolic CHF). Last visit 1 mo ago, after his hospitalization (see below).  After his hemoglobin A1c returned at 5.5%, he stopped all his diabetes meds, and soon after, he stopped checking his sugars, too, as they were at goal. He ended up in the hospital with sugars in the 900s. He was d/c on 03/25/13.   His last hemoglobin A1c was: Lab Results  Component Value Date   HGBA1C 11.0* 03/21/2013   HGBA1C 5.5 09/06/2012   HGBA1C 9.4* 07/07/2012   He was on a regimen of: - Levemir 28 units now  - Humalog 6 with dinner - Tradjenta 5 mg daily  He is now on: - Levemir 32 units  - Novolog 8 units  tid  He checks his sugars 3 times a day now, before meals and at bedtime (no log): - Before breakfast: 98-148, with most sugars between 110 and 120 >> 91-120 >> 110-130 - 2 hours after breakfast 125-149 >> n/c - Before lunch 75-187 >> 77- 140 >> n/c - 2 hours after lunch 107-201, these being his higher sugars >> n/c - Before dinner 86-157, with 1 low at 63 (only had peanuts for lunch) and 1 high at 198 >> 91-140, 2 x 170 >> 110-130 - 2 hours after dinner 100-150, with one high at 209 (after the 63 above, did not bolus) >> 109-140  He has chronic kidney disease, with the last BUN/creatinine: Lab Results  Component Value Date   BUN 16 03/24/2013   Lab Results  Component Value Date   CREATININE 1.10 03/24/2013   His last lipid panel: Lab Results  Component Value Date   CHOL 261* 03/23/2013   HDL NOT REPORTED DUE TO HIGH TRIGLYCERIDES 03/23/2013   LDLCALC UNABLE TO CALCULATE IF TRIGLYCERIDE OVER 400 mg/dL 40/01/8118   LDLDIRECT 8.3 03/21/2013   TRIG 1618* 03/23/2013   CHOLHDL NOT REPORTED DUE TO HIGH TRIGLYCERIDES 03/23/2013  Last eye  exam: no DR, 04/22/2013. No numbness and tingling in legs.   The patient also has a history of anxiety, hypertension, hyperlipidemia, nonischemic cardiomyopathy, gout, anemia, kidney stones (sees urology), tobacco and alcohol abuse.   Review of Systems Constitutional: no weight gain/loss, no fatigue, no subjective hyperthermia/hypothermia Eyes: no blurry vision, no xerophthalmia ENT: no sore throat, no nodules palpated in throat, no dysphagia/odynophagia, no hoarseness Cardiovascular: no CP/SOB/palpitations/leg swelling Respiratory: no cough/SOB Gastrointestinal: no N/V/D/C Musculoskeletal: no muscle/joint aches Skin: no rashes Neurological: no tremors/numbness/tingling/dizziness  I reviewed pt's medications, allergies, PMH, social hx, family hx and no changes required.    Objective:   Physical Exam BP 118/72  Pulse 69  Temp(Src) 98.2 F (36.8 C) (Oral)  Resp 12  Wt 206 lb 14.4 oz (93.849 kg)  SpO2 97% Wt Readings from Last 3 Encounters:  05/06/13 206 lb 14.4 oz (93.849 kg)  04/26/13 206 lb (93.441 kg)  04/04/13 205 lb 9 oz (93.241 kg)  Constitutional: overweight, in NAD Eyes: PERRLA, EOMI, no exophthalmos ENT: moist mucous membranes, no thyromegaly, no cervical lymphadenopathy Cardiovascular: RRR, No MRG Respiratory: CTA B Gastrointestinal: abdomen soft, NT, ND, BS+ Musculoskeletal: no deformities, strength intact in all 4 Skin: moist, warm, no rashes Foot exam done today Assessment:     1. DM2, uncontrolled, insulin-dependent, with complications -  chronic kidney disease, GFR 40 - sees Dr. Hyman Hopes - peripheral neuropathy - Chronic systolic CHF    Plan:     Patient was in a wonderful job in controlling his diabetes in the past, however, since he switched insurances, he tried to stop all his diabetes medications and not check sugars >> not surprisingly, his sugars increased greatly and he had to be admitted for this.  - he is now back on track and his sugars are at goal  mostly. He tells me he does not take the lunchtime NovoLog as he is at work then Patient Instructions  Please stop NovoLog with breakfast and lunch. Continue 8 units with dinner. Continue Levemir 32 units at night. Start Tradjenta 5 mg in am.  Please return in 2 months with your sugar log.  - I will see him back in 2 months - at that time will check a HbA1c  - up to date with eye exams - He will continue to send me his sugars through my chart

## 2013-05-06 NOTE — Patient Instructions (Signed)
Please stop NovoLog with breakfast and lunch. Continue 8 units with dinner. Continue Levemir 32 units at night. Start Tradjenta 5 mg in am.  Please return in 2 months with your sugar log.

## 2013-05-23 ENCOUNTER — Other Ambulatory Visit: Payer: Self-pay | Admitting: Cardiology

## 2013-06-23 ENCOUNTER — Other Ambulatory Visit: Payer: Self-pay | Admitting: Internal Medicine

## 2013-07-07 ENCOUNTER — Ambulatory Visit: Payer: PRIVATE HEALTH INSURANCE | Admitting: Internal Medicine

## 2013-07-19 ENCOUNTER — Ambulatory Visit: Payer: PRIVATE HEALTH INSURANCE | Admitting: Internal Medicine

## 2013-08-15 ENCOUNTER — Ambulatory Visit: Payer: PRIVATE HEALTH INSURANCE | Admitting: Internal Medicine

## 2013-09-01 ENCOUNTER — Ambulatory Visit (INDEPENDENT_AMBULATORY_CARE_PROVIDER_SITE_OTHER): Payer: PRIVATE HEALTH INSURANCE | Admitting: Internal Medicine

## 2013-09-01 ENCOUNTER — Encounter: Payer: Self-pay | Admitting: Internal Medicine

## 2013-09-01 VITALS — BP 118/78 | HR 72 | Temp 97.9°F | Resp 12 | Wt 210.0 lb

## 2013-09-01 DIAGNOSIS — E1149 Type 2 diabetes mellitus with other diabetic neurological complication: Secondary | ICD-10-CM

## 2013-09-01 DIAGNOSIS — N6310 Unspecified lump in the right breast, unspecified quadrant: Secondary | ICD-10-CM

## 2013-09-01 DIAGNOSIS — N63 Unspecified lump in unspecified breast: Secondary | ICD-10-CM

## 2013-09-01 LAB — HM DIABETES FOOT EXAM: HM Diabetic Foot Exam: NORMAL

## 2013-09-01 MED ORDER — GLUCOSE BLOOD VI STRP: ORAL_STRIP | Status: DC

## 2013-09-01 MED ORDER — ONETOUCH ULTRASOFT LANCETS MISC: Status: DC

## 2013-09-01 NOTE — Patient Instructions (Addendum)
Please continue the current regimen, but check your sugar 2x a day. Please return in 3 months with your sugar log.  Please stop at the lab.  Please ask Dr Stanford Breed about using Eplerenone instead of Spironolactone.

## 2013-09-01 NOTE — Progress Notes (Signed)
Subjective:     Patient ID: Mike Orozco, male   DOB: 10/10/1961, 53 y.o.   MRN: [id number]  HPI Mike Orozco is a pleasant 53 y.o. man, returning for f/u for DM2, uncontrolled, insulin-dependent, with complications (chronic kidney disease - GFR 40, peripheral neuropathy, systolic CHF). Last visit 4 mo ago.  After his hemoglobin A1c returned at 5.5%, he stopped all his diabetes meds, and soon after, he stopped checking his sugars, too, as they were at goal. He ended up in the hospital with sugars in the 900s. He was d/c on 03/25/13.   His last hemoglobin A1c was: Lab Results  Component Value Date   HGBA1C 11.0* 03/21/2013   HGBA1C 5.5 09/06/2012   HGBA1C 9.4* 07/07/2012   He is now on: - Levemir 32 units hs - Novolog 8-10 units with dinner - Tradjenta 5 mg daily  He checks his sugars once a day now (no log): - Before breakfast: 98-148, with most sugars between 110 and 120 >> 91-120 >> 110-130 >> 125-130 - 2 hours after breakfast 125-149 >> n/c - Before lunch 75-187 >> 77- 140 >> n/c - 2 hours after lunch 107-201, these being his higher sugars >> n/c - Before dinner 86-157, with 1 low at 63 (only had peanuts for lunch) and 1 high at 198 >> 91-140, 2 x 170 >> 110-130 >> 112-119 - 2 hours after dinner 100-150, with one high at 209 (after the 63 above, did not bolus) >> 109-140 >> 135-140 (202)  Highest CBG: 202  He has chronic kidney disease, with the last BUN/creatinine: Lab Results  Component Value Date   BUN 16 03/24/2013   Lab Results  Component Value Date   CREATININE 1.10 03/24/2013   His last lipid panel: Lab Results  Component Value Date   CHOL 261* 03/23/2013   HDL NOT REPORTED DUE TO HIGH TRIGLYCERIDES 03/23/2013   LDLCALC UNABLE TO CALCULATE IF TRIGLYCERIDE OVER 400 mg/dL 03/23/2013   LDLDIRECT 8.3 03/21/2013   TRIG 1618* 03/23/2013   CHOLHDL NOT REPORTED DUE TO HIGH TRIGLYCERIDES 03/23/2013  Last eye exam: no DR, 04/22/2013. No numbness and tingling in legs. Foot exam done at  this visit 09/01/2013.  The patient also has a history of anxiety, hypertension, hyperlipidemia, nonischemic cardiomyopathy, gout, anemia, kidney stones (sees urology), tobacco and alcohol abuse.   Review of Systems Constitutional: + weight gain, no fatigue, no subjective hyperthermia/hypothermia Eyes: no blurry vision, no xerophthalmia ENT: no sore throat, no nodules palpated in throat, no dysphagia/odynophagia, no hoarseness Cardiovascular: no CP/SOB/palpitations/leg swelling Respiratory: no cough/SOB Gastrointestinal: no N/V/D/C Musculoskeletal: no muscle/joint aches Skin: no rashes Neurological: no tremors/numbness/tingling/dizziness  I reviewed pt's medications, allergies, PMH, social hx, family hx and no changes required.    Objective:   Physical Exam BP 118/78  Pulse 72  Temp(Src) 97.9 F (36.6 C) (Oral)  Resp 12  Wt 210 lb (95.255 kg)  SpO2 97% Wt Readings from Last 3 Encounters:  09/01/13 210 lb (95.255 kg)  05/06/13 206 lb 14.4 oz (93.849 kg)  04/26/13 206 lb (93.441 kg)  Constitutional: overweight, in NAD Eyes: PERRLA, EOMI, no exophthalmos ENT: moist mucous membranes, no thyromegaly, no cervical lymphadenopathy Cardiovascular: RRR, No MRG Respiratory: CTA B Gastrointestinal: abdomen soft, NT, ND, BS+ Musculoskeletal: no deformities, strength intact in all 4 Skin: moist, warm, no rashes R gynecomastia ~ 2 cm Assessment:     1. DM2, uncontrolled, insulin-dependent, with complications - chronic kidney disease, GFR 40 - sees Dr. Justin Mend - peripheral neuropathy - Chronic systolic  CHF  2. Breast swelling - R-sided  - slight discomfort    Plan:     1. Pt is doing well on basal insulin + one dose of mealtime insulin with his dinner and on Tradjenta to cover the other 2 meals.  - sugars are at goal mostly, but not many sugar checks later in the day >> I advised him to try to check more then. Patient Instructions  Please continue the current regimen, but check  your sugar 2x a day. Please return in 3 months with your sugar log.  Please stop at the lab. Please ask Dr Stanford Breed about using Eplerenone instead of Spironolactone.  - will check a HbA1c today - up to date with eye exams - I will see him back in 3 months  2. R Breast swelling - by palpation, this appears to be gynecomastia - he is on Spironolactone, he will see his cardiologist in 1 mo >> will ask him about maybe switching to Eplerenone  - looking back in his chart, this was documented by PCP in 03/2013 and he was sent for an U/S of his breast. This was not done...  Office Visit on 09/01/2013  Component Date Value Ref Range Status  . Hemoglobin A1C 09/01/2013 7.1* 4.6 - 6.5 % Final   Glycemic Control Guidelines for People with Diabetes:Non Diabetic:  <6%Goal of Therapy: <7%Additional Action Suggested:  >8%   . HM Diabetic Foot Exam 09/01/2013 normal, but thick toenails   Final   HbA1c back to target.

## 2013-09-02 LAB — HEMOGLOBIN A1C: Hgb A1c MFr Bld: 7.1 % — ABNORMAL HIGH (ref 4.6–6.5)

## 2013-09-22 ENCOUNTER — Encounter: Payer: Self-pay | Admitting: *Deleted

## 2013-09-22 ENCOUNTER — Encounter: Payer: Self-pay | Admitting: Cardiology

## 2013-09-22 ENCOUNTER — Ambulatory Visit (INDEPENDENT_AMBULATORY_CARE_PROVIDER_SITE_OTHER): Payer: No Typology Code available for payment source | Admitting: Cardiology

## 2013-09-22 VITALS — BP 148/92 | HR 78 | Ht 68.0 in | Wt 208.0 lb

## 2013-09-22 DIAGNOSIS — I428 Other cardiomyopathies: Secondary | ICD-10-CM

## 2013-09-22 DIAGNOSIS — I10 Essential (primary) hypertension: Secondary | ICD-10-CM

## 2013-09-22 DIAGNOSIS — N63 Unspecified lump in unspecified breast: Secondary | ICD-10-CM

## 2013-09-22 DIAGNOSIS — N6310 Unspecified lump in the right breast, unspecified quadrant: Secondary | ICD-10-CM

## 2013-09-22 MED ORDER — LISINOPRIL 5 MG PO TABS: 5.0000 mg | ORAL_TABLET | Freq: Every day | ORAL | Status: DC

## 2013-09-22 NOTE — Assessment & Plan Note (Signed)
Blood pressure is mildly elevated. He apparently has had a small mass in his right breast by palpation. I have encouraged him to followup with his primary care physician for this issue. He may need an ultrasound. Discontinue spironolactone. Add lisinopril 5 mg daily. Check potassium and renal function in one week. Increase medications as needed.

## 2013-09-22 NOTE — Patient Instructions (Signed)
Your physician wants you to follow-up in: [place] will receive a reminder letter in the mail two months in advance. If you don't receive a letter, please call our office to schedule the follow-up appointment.   STOP SPIRONOLACTONE  START LISINOPRIL 5 MG ONCE DAILY  Your physician recommends that you return for lab work in: [place] has requested that you have an echocardiogram. Echocardiography is a painless test that uses sound waves to create images of your heart. It provides your doctor with information about the size and shape of your heart and how well your heart's chambers and valves are working. This procedure takes approximately one hour. There are no restrictions for this procedure.

## 2013-09-22 NOTE — Progress Notes (Signed)
HPI: FU nonischemic cardiomyopathy with EF initially at 15-20%. Cardiac catheterization in 03/2010 revealed normal coronary arteries. Last echocardiogram in May 2013 showed improved LV function with an ejection fraction of 50% and grade 1 diastolic dysfunction. He was last seen in April of 2014. Since then, the patient denies any dyspnea on exertion, orthopnea, PND, pedal edema, palpitations, syncope or chest pain.   Current Outpatient Prescriptions  Medication Sig Dispense Refill  . allopurinol (ZYLOPRIM) 300 MG tablet TAKE 1 TABLET (300 MG TOTAL) BY MOUTH DAILY.  90 tablet  1  . carvedilol (COREG) 25 MG tablet TAKE ONE TABLET BY MOUTH TWICE A DAY  60 tablet  2  . fenofibrate 160 MG tablet Take 1 tablet (160 mg total) by mouth at bedtime.  30 tablet  1  . ferrous sulfate 325 (65 FE) MG tablet Take 325 mg by mouth daily with breakfast.      . glucose blood test strip Check 2x a day  200 each  11  . insulin aspart (NOVOLOG) 100 UNIT/ML injection Inject up to 30 units daily as advised.  5 pen  6  . insulin detemir (LEVEMIR) 100 UNIT/ML injection Inject 0.32 mLs (32 Units total) into the skin daily.  5 pen  6  . Lancets (ONETOUCH ULTRASOFT) lancets Use 2x a day  200 each  11  . linagliptin (TRADJENTA) 5 MG TABS tablet Take 1 tablet (5 mg total) by mouth daily.  30 tablet  3  . Multiple Vitamin (MULTIVITAMIN) capsule Take 1 capsule by mouth daily.      . pravastatin (PRAVACHOL) 40 MG tablet Take 40 mg by mouth every evening.      Marland Kitchen spironolactone (ALDACTONE) 25 MG tablet TAKE 1 TABLET BY MOUTH EVERY DAY  30 tablet  11   No current facility-administered medications for this visit.     Past Medical History  Diagnosis Date  . Heart failure, chronic systolic   . Non-ischemic cardiomyopathy SECONDARY TO HX UNCONTROLLED HTN AND ETOH ABUSE----  LAST ECHO EF 50%  09-22-2011    a) Echo 03/25/2010: EF 20-25%; mild AI; Mod MR; Mild LAE  b)cardiac cath 03/26/2010: normal cors; severe pulmonary  HTN;PCWP 41; EF 10-15%;  c. echo 4/12:  EF 45-50%, mild LVH, grade 1 diast dysfxn, mild AI, mild LAE  . History of ETOH abuse   . Gout, unspecified     stable  per pt  . Pure hyperglyceridemia   . Anemia   . Personal history of colonic polyps-adenoma 02/17/2012    01/2012 - diminutive adenoma Repeat colon about 01/2017    . Heart block AV first degree   . Hypertension     CARDIOLOGIST-  DR Stanford Breed--- [facility] 09-08-2011  . Chronic renal insufficiency baseline CRE  1.5--1.8    NEPHROLOGIST--  DR WEBB ([facility])  . Nephrolithiasis     RIGHT  . Polycystic kidney disease     BILATERAL RENAL CYST  . Diabetes mellitus type 2, insulin dependent PER pt and LAST PCP NOTE CBG'S IN 100'    RECENTLY UNCONTROLLED AIC 07-07-2012  9.4 AND CBG'S 400'S  . GERD (gastroesophageal reflux disease)   . Arthritis   . CHF (congestive heart failure)     Past Surgical History  Procedure Laterality Date  . Lumbar fusion  08-10-2003    L5  --  S1  . Removal cyst left lung  1994   AT [facility]    benign  . Knee arthroscopy w/  meniscectomy Left 02-06-2011    AND CHONDROPLASTY  . Cardiac catheterization  03-26-2010  DR [place]    SEVERE LV DYSFUNCTION WITH MARKEDLY ELEVATED FILLING PRESSURE/ SEVERE PULMONARY HYPERTENSION/ NORMAL CORONARY ANATOMY  . Transthoracic echocardiogram  09-22-2011  DR CRENSHAW    EF 50%/  MILD LVH/ GRADE I DIASTOLIC DYSFUNCTION/ MILD TR  . Rhinoplasty  1970'S    nose fx  . Cystoscopy with retrograde pyelogram, ureteroscopy and stent placement Right 08/18/2012    Procedure: CYSTOSCOPY WITH RETROGRADE PYELOGRAM, URETEROSCOPY AND STENT PLACEMENT;  Surgeon: Molli Hazard, MD;  Location: [facility];  Service: Urology;  Laterality: Right;  . Colonoscopy    . Nephrolithotomy Right 09/15/2012    Procedure: NEPHROLITHOTOMY PERCUTANEOUS;  Surgeon: Molli Hazard, MD;  Location: [facility];  Service: Urology;  Laterality: Right;  RIGHT PERCUTANEOUS  NEPHROSTOLITHOTOMY   . Kidney stone surgery      X 2    History   Social History  . Marital Status: Married    Spouse Name: N/A    Number of Children: 1  . Years of Education: N/A   Occupational History  . [employer]   Social History Main Topics  . Smoking status: Former Smoker -- 0.50 packs/day for 32 years    Types: Cigarettes    Quit date: 02/27/2007  . Smokeless tobacco: Never Used  . Alcohol Use: No     Comment: HX ALCOHOL ABUSE--  STATES  Quit 02/2010  . Drug Use: No  . Sexual Activity: Not on file   Other Topics Concern  . Not on file   Social History Narrative   No regular exercise   Daily caffeine          ROS: no fevers or chills, productive cough, hemoptysis, dysphasia, odynophagia, melena, hematochezia, dysuria, hematuria, rash, seizure activity, orthopnea, PND, pedal edema, claudication. Remaining systems are negative.  Physical Exam: Well-developed well-nourished in no acute distress.  Skin is warm and dry.  HEENT is normal.  Neck is supple.  Chest is clear to auscultation with normal expansion.  Cardiovascular exam is regular rate and rhythm.  Abdominal exam nontender or distended. No masses palpated. Extremities show no edema. neuro grossly intact  ECG Sinus rhythm at a rate of 78. Left ventricular hypertrophy.

## 2013-09-22 NOTE — Addendum Note (Signed)
Addended by: Cristopher Estimable on: 09/22/2013 03:06 PM   Modules accepted: Orders

## 2013-09-22 NOTE — Assessment & Plan Note (Addendum)
Improved on most recent echocardiogram. Plan repeat echocardiogram. Continue beta blocker. Add lisinopril 5 mg daily.

## 2013-09-22 NOTE — Assessment & Plan Note (Signed)
Discontinue Spironolactone. Followup primary care.

## 2013-09-22 NOTE — Addendum Note (Signed)
Addended by: Cristopher Estimable on: 09/22/2013 03:14 PM   Modules accepted: Orders

## 2013-09-25 IMAGING — XA IR PERC INTRO URET CATH*R*
1 series · 4 of 4 positions shown · non-contrast
Comparison: none

CLINICAL DATA: Right nephrolithiasis

RIGHT ANTEGRADE NEPHRO-URETERAL   CATHETER PLACEMENT UNDER
FLUOROSCOPIC GUIDANCE
TECHNIQUE: The procedure, risks (including but not limited to
bleeding, infection, organ damage), benefits, and alternatives were
explained to the patient.  Questions regarding the procedure were
encouraged and answered.  The patient understands and consents to
the procedure.
The rightflank region prepped with Betadine, draped in usual
sterile fashion, infiltrated locally with 1% lidocaine.

[Series 300: tube placements · 4 of 4 slices shown]
[im 1/4]
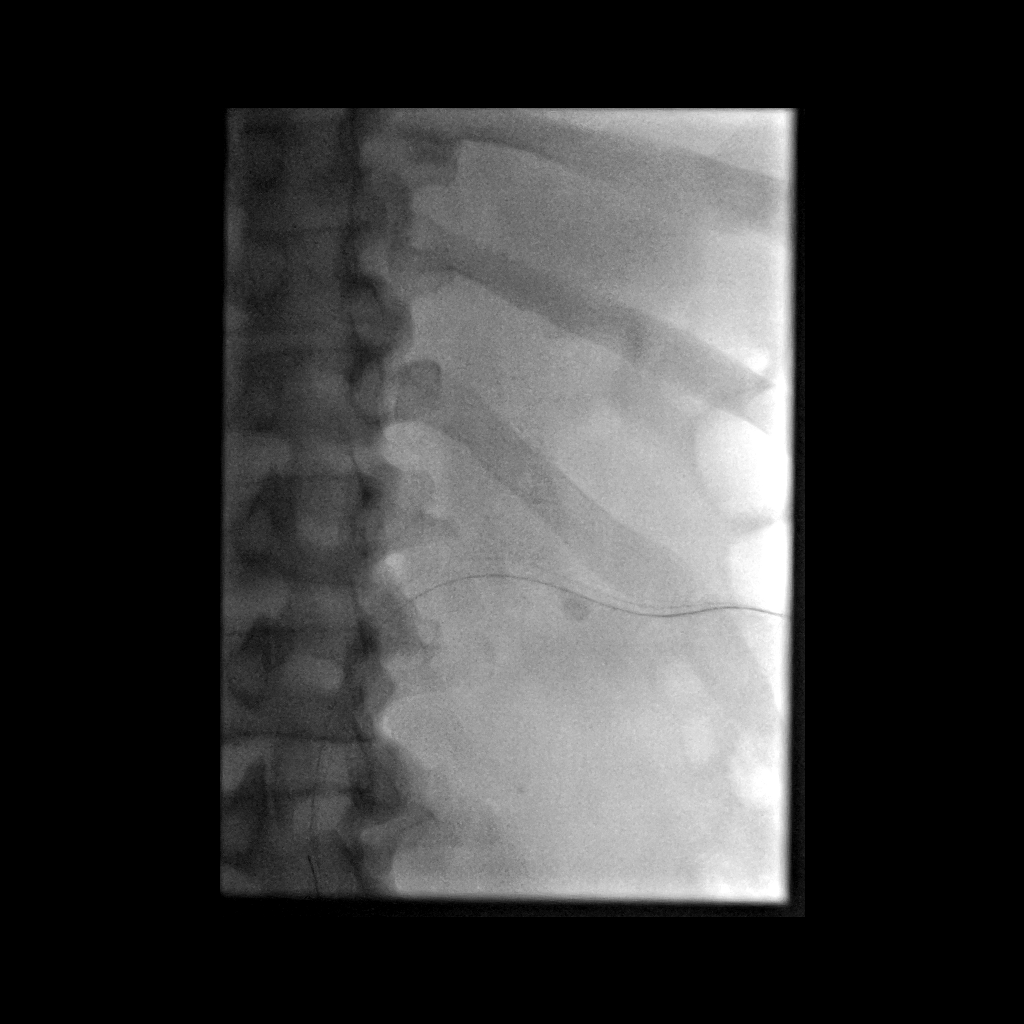
[im 2/4]
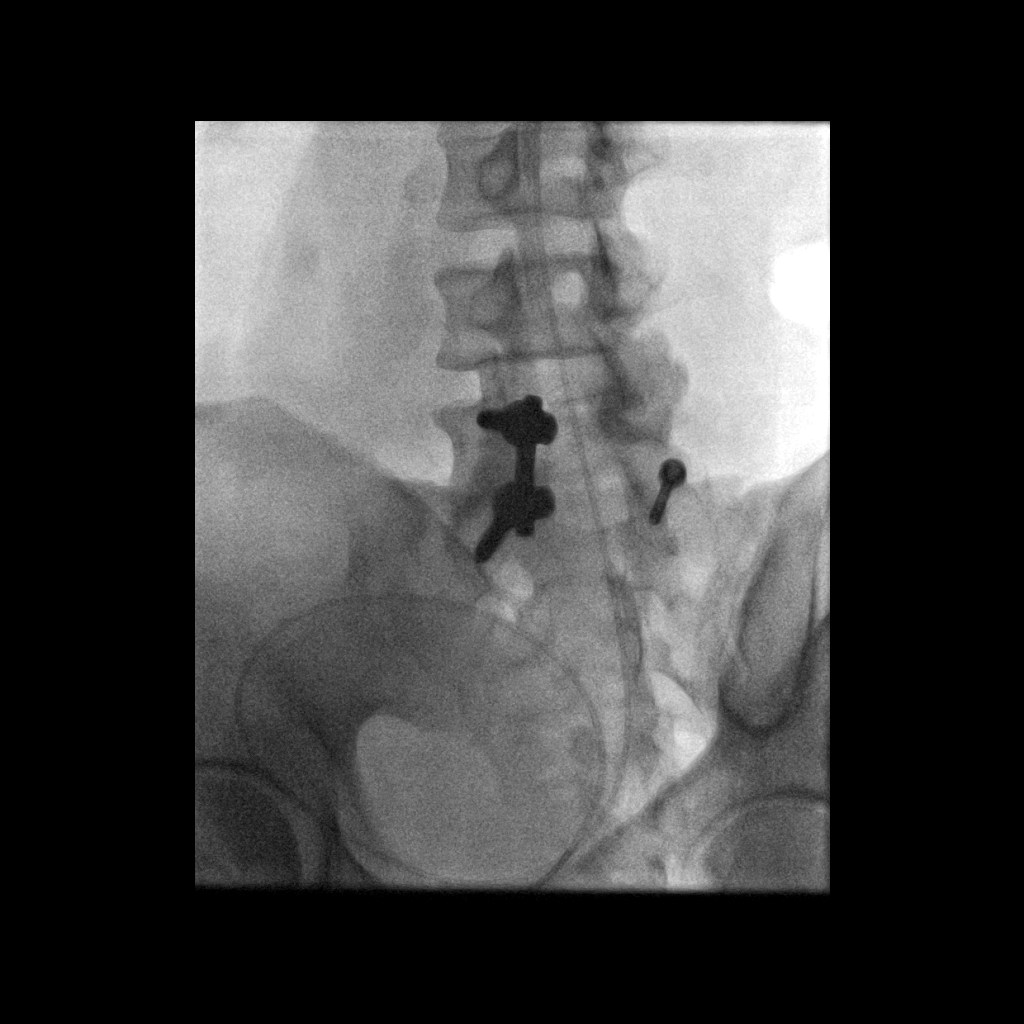
[im 3/4]
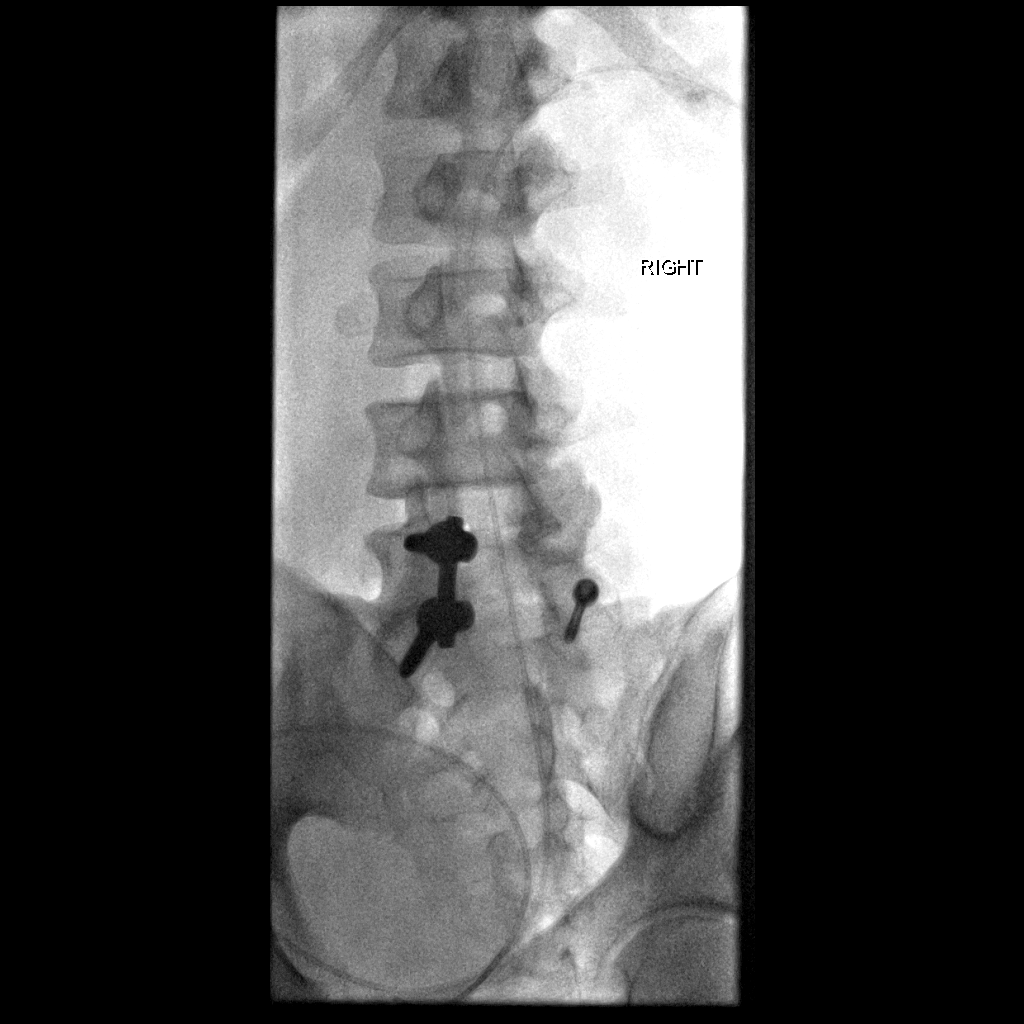
[im 4/4]
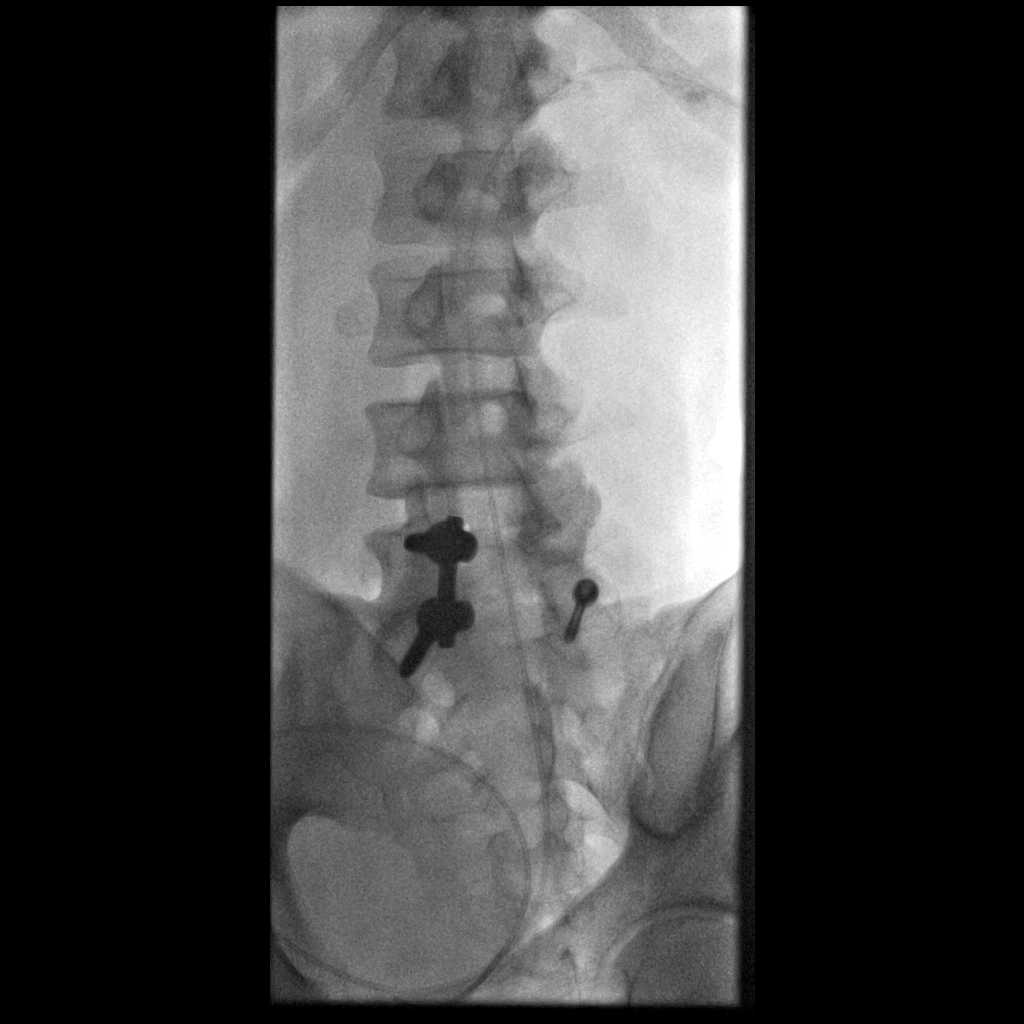

[4 of 4 positions shown; findings below may reference images not displayed]

As antibiotic prophylaxis, Cipro 400 mg was ordered pre-procedure
and administered intravenously within one hour of incision.

 Intravenous Fentanyl and Versed were administered as conscious
sedation during continuous cardiorespiratory monitoring by the
radiology RN, with a total moderate sedation time of 15 minutes.

 Under fluoroscopic guidance, a 21-gauge trocar needle was advanced
into a posterior lower pole calyx using the contained radiodense
calculus as a guide.   Needle was exchanged over a guidewire for
transitional dilator.   Catheter was exchanged over a guidewire for
a 5-French Kumpe catheter, advanced the colon in the urinary
bladder.   Catheter   secured externally and capped. No immediate
complication.

Fluoroscopy time: 3 minutes 18  seconds

IMPRESSION
Technically successful right antegrade nephro-ureteral  catheter
placement.

## 2013-09-26 ENCOUNTER — Other Ambulatory Visit (INDEPENDENT_AMBULATORY_CARE_PROVIDER_SITE_OTHER): Payer: No Typology Code available for payment source

## 2013-09-26 ENCOUNTER — Telehealth: Payer: Self-pay | Admitting: Internal Medicine

## 2013-09-26 DIAGNOSIS — N63 Unspecified lump in unspecified breast: Secondary | ICD-10-CM

## 2013-09-26 DIAGNOSIS — N6310 Unspecified lump in the right breast, unspecified quadrant: Secondary | ICD-10-CM

## 2013-09-26 DIAGNOSIS — I10 Essential (primary) hypertension: Secondary | ICD-10-CM

## 2013-09-26 DIAGNOSIS — I428 Other cardiomyopathies: Secondary | ICD-10-CM

## 2013-09-26 NOTE — Telephone Encounter (Signed)
Pt stated he did not know that the appt was scheduled back in November, pt found this out when he saw Dr. Stanford Breed. Please advise.

## 2013-09-26 NOTE — Telephone Encounter (Signed)
Pt request referral for mammogram due to the small mass on chest area that found last ov with Dr. Ronnald Ramp. Please advise. Ok to leave detail massage on [phone].

## 2013-09-26 NOTE — Telephone Encounter (Signed)
Why didn't he get the ultrasound done that was ordered last November?

## 2013-09-27 ENCOUNTER — Encounter: Payer: Self-pay | Admitting: Cardiology

## 2013-09-27 ENCOUNTER — Other Ambulatory Visit: Payer: Self-pay | Admitting: *Deleted

## 2013-09-27 DIAGNOSIS — R799 Abnormal finding of blood chemistry, unspecified: Secondary | ICD-10-CM

## 2013-09-27 LAB — BASIC METABOLIC PANEL
BUN: 30 mg/dL — AB (ref 6–23)
CALCIUM: 9.7 mg/dL (ref 8.4–10.5)
CHLORIDE: 102 meq/L (ref 96–112)
CO2: 23 meq/L (ref 19–32)
Creatinine, Ser: 1.5 mg/dL (ref 0.4–1.5)
GFR: 64.96 mL/min (ref >60.00)
GLUCOSE: 366 mg/dL — AB (ref 70–99)
Potassium: 4.4 mEq/L (ref 3.5–5.1)
Sodium: 133 mEq/L — ABNORMAL LOW (ref 135–145)

## 2013-09-27 NOTE — Telephone Encounter (Signed)
New message  ° ° °Patient calling for test results.   °

## 2013-09-27 NOTE — Telephone Encounter (Signed)
Follow up         Pt calling for test results

## 2013-09-27 NOTE — Telephone Encounter (Signed)
This encounter was created in error - please disregard.

## 2013-10-03 ENCOUNTER — Telehealth: Payer: Self-pay | Admitting: *Deleted

## 2013-10-03 ENCOUNTER — Telehealth: Payer: Self-pay | Admitting: Internal Medicine

## 2013-10-03 NOTE — Telephone Encounter (Signed)
Blood sugar dropped to 190 Patient would like you to call him   Thank You :)

## 2013-10-03 NOTE — Telephone Encounter (Signed)
Be advised pt's bg dropped to 190. Called pt and advised him to stay away from the gatorade, too much sugar. Advised pt to re-hydrate by drinking plenty of water. Pt understood.

## 2013-10-03 NOTE — Telephone Encounter (Signed)
Pt called stating that he had 3 blueberry cookies and some gatorade. He checked his bg and it was 300. Pt stated he took 10 units of insulin. Advised pt to drink plenty of water and call me back in 1 hour to see if his bg has come down since taking the 10 units of insulin. Be advised.

## 2013-10-07 ENCOUNTER — Telehealth: Payer: Self-pay | Admitting: Cardiology

## 2013-10-07 ENCOUNTER — Emergency Department (INDEPENDENT_AMBULATORY_CARE_PROVIDER_SITE_OTHER)
Admission: EM | Admit: 2013-10-07 | Discharge: 2013-10-07 | Disposition: A | Discharge status: Home / Self Care | Payer: No Typology Code available for payment source | Source: Home / Self Care | Attending: Emergency Medicine | Admitting: Emergency Medicine

## 2013-10-07 ENCOUNTER — Encounter (HOSPITAL_COMMUNITY): Payer: Self-pay | Admitting: Emergency Medicine

## 2013-10-07 DIAGNOSIS — N489 Disorder of penis, unspecified: Secondary | ICD-10-CM

## 2013-10-07 DIAGNOSIS — E119 Type 2 diabetes mellitus without complications: Secondary | ICD-10-CM

## 2013-10-07 DIAGNOSIS — N4889 Other specified disorders of penis: Secondary | ICD-10-CM

## 2013-10-07 DIAGNOSIS — B3749 Other urogenital candidiasis: Secondary | ICD-10-CM

## 2013-10-07 LAB — GLUCOSE, CAPILLARY: Glucose-Capillary: 231 mg/dL — ABNORMAL HIGH (ref 70–99)

## 2013-10-07 MED ORDER — CLOTRIMAZOLE 1 % EX CREA: TOPICAL_CREAM | CUTANEOUS | Status: DC

## 2013-10-07 NOTE — Telephone Encounter (Signed)
Left message for pt, he can come on 10-17-13 and have lab work with echo

## 2013-10-07 NOTE — Discharge Instructions (Signed)
Balanitis Balanitis is inflammation of the head of the penis (glans).  CAUSES  Balanitis has multiple causes, both infectious and noninfectious. Frequently balanitis is the result of poor personal hygiene, especially in uncircumcised males. Without adequate washing, viruses, bacteria, and yeast collect between the foreskin and the glans. This can cause an infection. Lack of air and irritation from a normal secretion called smegma contribute to the cause in uncircumcised males. Other causes include:  Chemical irritation from the use of certain soaps and shower gels (especially soaps with perfumes), condoms, personal lubricants, petroleum jelly, spermicides, and fabric conditioners.  Skin conditions, such as eczema, dermatitis, and psoriasis.  Allergies to drugs, such as tetracycline and sulfa.  Certain medical conditions, including liver cirrhosis, congestive heart failure, and kidney disease.  Morbid obesity. RISK FACTORS  Diabetes mellitus.  Phimosis A tight foreskin that is difficult to pull back past the glans.  Sex without the use of a condom. SIGNS AND SYMPTOMS  Symptoms may include:  Discharge coming from under the foreskin.  Tenderness.  Itching and inability to get an erection (because of the pain).  Redness and a rash.  Sores on the glans and on the foreskin. DIAGNOSIS Diagnosis of balanitis is confirmed through a physical exam. TREATMENT The treatment is based on the cause of the balanitis. Treatment may include frequent cleansing, keeping the glans and foreskin dry, use of medicines such as creams, pain medicines, antibiotics, or medicines to treat fungal infections. Sitz baths may be used. If the irritation has caused a scar on the foreskin that prevents easy retraction, a circumcision may be recommended.  HOME CARE INSTRUCTIONS  Sex should be avoided until the condition has cleared. Document Released: 09/21/2008 Document Revised: 01/05/2013 Document Reviewed:  10/25/2012 [facility] Patient Information 2014 [place], [place].  Twice daily washing gently with mild antibacterial soap and water. No scrubbing keep dry and clean. Use ointment after area clean and dry. Make appt to f/u with Urology (name given above) for circumcision.

## 2013-10-07 NOTE — ED Provider Notes (Signed)
CSN: [id number]     Arrival date & time 10/07/13  1701 History   First MD Initiated Contact with Patient 10/07/13 1749     Chief Complaint  Patient presents with  . Groin Swelling   (Consider location/radiation/quality/duration/timing/severity/associated sxs/prior Treatment) HPI Comments: Patient presents with a painful rash around the tip of the penis. Her reports getting the end caught in a zipper last week. He is not circumsized and states it began to swell and become irritated. No drainage. Some burning and swelling. No dysuria, hematuria. No exposures to STD that he is aware.   The history is provided by the patient.    Past Medical History  Diagnosis Date  . Heart failure, chronic systolic   . Non-ischemic cardiomyopathy SECONDARY TO HX UNCONTROLLED HTN AND ETOH ABUSE----  LAST ECHO EF 50%  09-22-2011    a) Echo 03/25/2010: EF 20-25%; mild AI; Mod MR; Mild LAE  b)cardiac cath 03/26/2010: normal cors; severe pulmonary HTN;PCWP 41; EF 10-15%;  c. echo 4/12:  EF 45-50%, mild LVH, grade 1 diast dysfxn, mild AI, mild LAE  . History of ETOH abuse   . Gout, unspecified     stable  per pt  . Pure hyperglyceridemia   . Anemia   . Personal history of colonic polyps-adenoma 02/17/2012    01/2012 - diminutive adenoma Repeat colon about 01/2017    . Heart block AV first degree   . Hypertension     CARDIOLOGIST-  DR Stanford Breed--- [facility] 09-08-2011  . Chronic renal insufficiency baseline CRE  1.5--1.8    NEPHROLOGIST--  DR WEBB ([facility])  . Nephrolithiasis     RIGHT  . Polycystic kidney disease     BILATERAL RENAL CYST  . Diabetes mellitus type 2, insulin dependent PER pt and LAST PCP NOTE CBG'S IN 100'    RECENTLY UNCONTROLLED AIC 07-07-2012  9.4 AND CBG'S 400'S  . GERD (gastroesophageal reflux disease)   . Arthritis   . CHF (congestive heart failure)    Past Surgical History  Procedure Laterality Date  . Lumbar fusion  08-10-2003    L5  --  S1  . Removal cyst left lung   1994   AT [facility]    benign  . Knee arthroscopy w/ meniscectomy Left 02-06-2011    AND CHONDROPLASTY  . Cardiac catheterization  03-26-2010  DR [place]    SEVERE LV DYSFUNCTION WITH MARKEDLY ELEVATED FILLING PRESSURE/ SEVERE PULMONARY HYPERTENSION/ NORMAL CORONARY ANATOMY  . Transthoracic echocardiogram  09-22-2011  DR CRENSHAW    EF 50%/  MILD LVH/ GRADE I DIASTOLIC DYSFUNCTION/ MILD TR  . Rhinoplasty  1970'S    nose fx  . Cystoscopy with retrograde pyelogram, ureteroscopy and stent placement Right 08/18/2012    Procedure: CYSTOSCOPY WITH RETROGRADE PYELOGRAM, URETEROSCOPY AND STENT PLACEMENT;  Surgeon: Molli Hazard, MD;  Location: [facility];  Service: Urology;  Laterality: Right;  . Colonoscopy    . Nephrolithotomy Right 09/15/2012    Procedure: NEPHROLITHOTOMY PERCUTANEOUS;  Surgeon: Molli Hazard, MD;  Location: [facility];  Service: Urology;  Laterality: Right;  RIGHT PERCUTANEOUS NEPHROSTOLITHOTOMY   . Kidney stone surgery      X 2   Family History  Problem Relation Age of Onset  . Diabetes Mother   . Hypertension Mother   . Diabetes Father   . Hypertension Father   . Colon cancer Neg Hx    History  Substance Use Topics  . Smoking status: Former Smoker -- 0.50 packs/day for  32 years    Types: Cigarettes    Quit date: 02/27/2007  . Smokeless tobacco: Never Used  . Alcohol Use: No     Comment: HX ALCOHOL ABUSE--  STATES  Quit 02/2010    Review of Systems  All other systems reviewed and are negative.   Allergies  Metformin and related  Home Medications   Prior to Admission medications   Medication Sig Start Date End Date Taking? Authorizing Provider  carvedilol (COREG) 25 MG tablet TAKE ONE TABLET BY MOUTH TWICE A DAY 05/23/13  Yes Lelon Perla, MD  insulin aspart (NOVOLOG) 100 UNIT/ML injection Inject up to 30 units daily as advised. 04/01/13  Yes Philemon Kingdom, MD  insulin detemir (LEVEMIR) 100 UNIT/ML injection Inject 0.32 mLs (32  Units total) into the skin daily. 04/01/13  Yes Philemon Kingdom, MD  lisinopril (PRINIVIL,ZESTRIL) 5 MG tablet Take 1 tablet (5 mg total) by mouth daily. 09/22/13  Yes Lelon Perla, MD  pravastatin (PRAVACHOL) 40 MG tablet Take 40 mg by mouth every evening.   Yes Historical Provider, MD  allopurinol (ZYLOPRIM) 300 MG tablet TAKE 1 TABLET (300 MG TOTAL) BY MOUTH DAILY. 06/23/13   Janith Lima, MD  clotrimazole (LOTRIMIN) 1 % cream Apply to affected area 2 times daily for 2 weeks 10/07/13   Bjorn Pippin, PA-C  fenofibrate 160 MG tablet Take 1 tablet (160 mg total) by mouth at bedtime. 03/25/13   Costin Karlyne Greenspan, MD  ferrous sulfate 325 (65 FE) MG tablet Take 325 mg by mouth daily with breakfast.    Historical Provider, MD  glucose blood test strip Check 2x a day 09/01/13   Philemon Kingdom, MD  Lancets [facility] ULTRASOFT) lancets Use 2x a day 09/01/13   Philemon Kingdom, MD  linagliptin (TRADJENTA) 5 MG TABS tablet Take 1 tablet (5 mg total) by mouth daily. 05/06/13   Philemon Kingdom, MD  Multiple Vitamin (MULTIVITAMIN) capsule Take 1 capsule by mouth daily.    Historical Provider, MD   BP 146/88  Pulse 55  Temp(Src) 97.8 F (36.6 C) (Oral)  Resp 18  SpO2 99% Physical Exam  Nursing note and vitals reviewed. Constitutional: He is oriented to person, place, and time. He appears well-developed and well-nourished. No distress.  Pulmonary/Chest: Effort normal.  Genitourinary: Penile tenderness present.  Foreskin with swelling, erythema and mild ulcerations around the glans with edema. No lesions or drainage.  Neurological: He is alert and oriented to person, place, and time.  Skin: Rash noted. He is not diaphoretic. There is erythema.  Psychiatric: His behavior is normal.    ED Course  Procedures (including critical care time) Labs Review Labs Reviewed  GLUCOSE, CAPILLARY - Abnormal; Notable for the following:    Glucose-Capillary 231 (*)    All other components within normal limits     Imaging Review No results found.   MDM   1. Candidal balanoposthitis   2. Penile pain   3. Diabetes    Discussed with Dr. Jake Michaelis who was in to see and evaluate Mr. Stanislawski today. Candida in the setting of possible injury and associated Diabetes CBG 231. Discussed re: importance of good diabetic control, and hygiene. Treated with cream. Info given for Urology referral to be circumcised.     Bjorn Pippin, PA-C 10/07/13 [phone]

## 2013-10-07 NOTE — Telephone Encounter (Signed)
New message          Pt would like to know if he has to come into the office to do any labs this week? If so he would like to come next week.

## 2013-10-07 NOTE — ED Notes (Signed)
Pt reports he got his penis caught in the zipper of his pants last week  Sx today include swelling, redness and tender Denies fevers Alert w/no signs of acute distress.

## 2013-10-09 NOTE — ED Provider Notes (Signed)
Medical screening examination/treatment/procedure(s) were performed by non-physician practitioner and as supervising physician I was immediately available for consultation/collaboration.  Philipp Deputy, M.D.  Harden Mo, MD 10/09/13 [phone]

## 2013-10-17 ENCOUNTER — Other Ambulatory Visit (HOSPITAL_COMMUNITY): Payer: No Typology Code available for payment source

## 2013-10-17 ENCOUNTER — Other Ambulatory Visit: Payer: No Typology Code available for payment source

## 2013-10-19 ENCOUNTER — Other Ambulatory Visit: Payer: Self-pay | Admitting: *Deleted

## 2013-10-19 MED ORDER — CARVEDILOL 25 MG PO TABS: ORAL_TABLET | ORAL | Status: DC

## 2013-11-02 ENCOUNTER — Ambulatory Visit (HOSPITAL_COMMUNITY): Payer: No Typology Code available for payment source | Attending: Cardiology | Admitting: Radiology

## 2013-11-02 ENCOUNTER — Other Ambulatory Visit (INDEPENDENT_AMBULATORY_CARE_PROVIDER_SITE_OTHER): Payer: No Typology Code available for payment source

## 2013-11-02 DIAGNOSIS — F1010 Alcohol abuse, uncomplicated: Secondary | ICD-10-CM | POA: Insufficient documentation

## 2013-11-02 DIAGNOSIS — N6310 Unspecified lump in the right breast, unspecified quadrant: Secondary | ICD-10-CM

## 2013-11-02 DIAGNOSIS — I10 Essential (primary) hypertension: Secondary | ICD-10-CM | POA: Insufficient documentation

## 2013-11-02 DIAGNOSIS — I509 Heart failure, unspecified: Secondary | ICD-10-CM

## 2013-11-02 DIAGNOSIS — R799 Abnormal finding of blood chemistry, unspecified: Secondary | ICD-10-CM

## 2013-11-02 DIAGNOSIS — Z87891 Personal history of nicotine dependence: Secondary | ICD-10-CM | POA: Insufficient documentation

## 2013-11-02 DIAGNOSIS — I428 Other cardiomyopathies: Secondary | ICD-10-CM

## 2013-11-02 DIAGNOSIS — R7989 Other specified abnormal findings of blood chemistry: Secondary | ICD-10-CM

## 2013-11-02 DIAGNOSIS — E669 Obesity, unspecified: Secondary | ICD-10-CM | POA: Insufficient documentation

## 2013-11-02 LAB — BASIC METABOLIC PANEL
BUN: 25 mg/dL — ABNORMAL HIGH (ref 6–23)
CHLORIDE: 100 meq/L (ref 96–112)
CO2: 22 meq/L (ref 19–32)
Calcium: 9.5 mg/dL (ref 8.4–10.5)
Creatinine, Ser: 1.4 mg/dL (ref 0.4–1.5)
GFR: 67.05 mL/min (ref >60.00)
GLUCOSE: 432 mg/dL — AB (ref 70–99)
POTASSIUM: 4 meq/L (ref 3.5–5.1)
SODIUM: 131 meq/L — AB (ref 135–145)

## 2013-11-02 NOTE — Progress Notes (Signed)
Echocardiogram performed.  

## 2013-11-04 ENCOUNTER — Encounter: Payer: Self-pay | Admitting: Cardiology

## 2013-11-04 NOTE — Telephone Encounter (Signed)
New Message:  Pt is requesting a call back from the nurse to hear his recent test results

## 2013-11-04 NOTE — Telephone Encounter (Signed)
This encounter was created in error - please disregard.

## 2013-12-02 ENCOUNTER — Ambulatory Visit: Payer: PRIVATE HEALTH INSURANCE | Admitting: Internal Medicine

## 2013-12-04 ENCOUNTER — Other Ambulatory Visit: Payer: Self-pay | Admitting: Internal Medicine

## 2013-12-22 ENCOUNTER — Ambulatory Visit: Payer: No Typology Code available for payment source | Admitting: Internal Medicine

## 2013-12-23 ENCOUNTER — Encounter: Payer: Self-pay | Admitting: Internal Medicine

## 2013-12-23 ENCOUNTER — Ambulatory Visit (INDEPENDENT_AMBULATORY_CARE_PROVIDER_SITE_OTHER): Payer: No Typology Code available for payment source | Admitting: Internal Medicine

## 2013-12-23 ENCOUNTER — Other Ambulatory Visit: Payer: Self-pay

## 2013-12-23 ENCOUNTER — Telehealth: Payer: Self-pay

## 2013-12-23 VITALS — BP 118/68 | HR 62 | Temp 98.0°F | Resp 12 | Wt 200.0 lb

## 2013-12-23 DIAGNOSIS — E1149 Type 2 diabetes mellitus with other diabetic neurological complication: Secondary | ICD-10-CM

## 2013-12-23 DIAGNOSIS — E785 Hyperlipidemia, unspecified: Secondary | ICD-10-CM

## 2013-12-23 LAB — HEMOGLOBIN A1C: HEMOGLOBIN A1C: 7.5 % — AB (ref 4.6–6.5)

## 2013-12-23 MED ORDER — INSULIN DETEMIR 100 UNIT/ML ~~LOC~~ SOLN: 25.0000 [IU] | Freq: Every day | SUBCUTANEOUS | Status: DC

## 2013-12-23 NOTE — Progress Notes (Signed)
Subjective:     Patient ID: Mike Orozco, male   DOB: 10/10/1961, 53 y.o.   MRN: [id number]  HPI Mike Orozco is a pleasant 53 y.o. man, returning for f/u for DM2, uncontrolled, insulin-dependent, with complications (chronic kidney disease - GFR 40, peripheral neuropathy, systolic CHF). Last visit 4 mo ago.  His last hemoglobin A1c was: Lab Results  Component Value Date   HGBA1C 7.1* 09/01/2013   HGBA1C 11.0* 03/21/2013   HGBA1C 5.5 09/06/2012   He was now on: - Novolog 10 units only in am - Tradjenta 5 mg daily He stopped it Levemir 32 units hs a mo ago  He checks his sugars 2x a day (no log, we downloaded his meter): - Before breakfast: 98-148, with most sugars between 110 and 120 >> 91-120 >> 110-130 >> 125-130 >> 135-195 (213 x1) - 2 hours after breakfast 125-149 >> n/c 150-195 - Before lunch 75-187 >> 77- 140 >> n/c >> 156-196 - 2 hours after lunch 107-201, these being his higher sugars >> n/c - Before dinner 86-157, with 1 low at 63 (only had peanuts for lunch) and 1 high at 198 >> 91-140, 2 x 170 >> 110-130 >> 112-119 >> 100-112 - 2 hours after dinner 100-150, with one high at 209 (after the 63 above, did not bolus) >> 109-140 >> 135-140 (202) >> n/c Highest CBG: 213  He has chronic kidney disease, with the last BUN/creatinine: Lab Results  Component Value Date   BUN 25* 11/02/2013   Lab Results  Component Value Date   CREATININE 1.4 11/02/2013   His last lipid panel: Lab Results  Component Value Date   CHOL 261* 03/23/2013   HDL NOT REPORTED DUE TO HIGH TRIGLYCERIDES 03/23/2013   LDLCALC UNABLE TO CALCULATE IF TRIGLYCERIDE OVER 400 mg/dL 03/23/2013   LDLDIRECT 8.3 03/21/2013   TRIG 1618* 03/23/2013   CHOLHDL NOT REPORTED DUE TO HIGH TRIGLYCERIDES 03/23/2013  Last eye exam: no DR, 04/22/2013. No numbness and tingling in legs. Foot exam done at this visit 09/01/2013.  The patient also has a history of anxiety, hypertension, hyperlipidemia, nonischemic cardiomyopathy, gout, anemia,  kidney stones (sees urology), tobacco and alcohol abuse.  After his hemoglobin A1c returned at 5.5%, he stopped all his diabetes meds, and soon after, he stopped checking his sugars, too, as they were at goal. He ended up in the hospital with sugars in the 900s. He was d/c on 03/25/13.   Review of Systems Constitutional: no weight gain, no fatigue, no subjective hyperthermia/hypothermia Eyes: no blurry vision, no xerophthalmia ENT: no sore throat, no nodules palpated in throat, no dysphagia/odynophagia, no hoarseness Cardiovascular: no CP/SOB/palpitations/leg swelling Respiratory: no cough/SOB Gastrointestinal: no N/V/D/C Musculoskeletal: no muscle/joint aches Skin: no rashes Neurological: no tremors/numbness/tingling/dizziness  I reviewed pt's medications, allergies, PMH, social hx, family hx and no changes required.    Objective:   Physical Exam BP 118/68  Pulse 62  Temp(Src) 98 F (36.7 C) (Oral)  Resp 12  Wt 200 lb (90.719 kg)  SpO2 98% Wt Readings from Last 3 Encounters:  12/23/13 200 lb (90.719 kg)  09/22/13 208 lb (94.348 kg)  09/01/13 210 lb (95.255 kg)  Constitutional: overweight, in NAD Eyes: PERRLA, EOMI, no exophthalmos ENT: moist mucous membranes, no thyromegaly, no cervical lymphadenopathy Cardiovascular: RRR, No MRG Respiratory: CTA B Gastrointestinal: abdomen soft, NT, ND, BS+ Musculoskeletal: no deformities, strength intact in all 4 Skin: moist, warm, no rashes  Assessment:     1. DM2, uncontrolled, insulin-dependent, with complications - chronic kidney disease,  GFR 40 - sees Dr. Justin Mend - peripheral neuropathy - Chronic systolic CHF    Plan:     1. Pt was doing well on basal insulin + one dose of mealtime insulin with his dinner and on Tradjenta, but he stopped his basal insulin and now sugars are higher >> advised to restart Levemir: Patient Instructions  Please stop NovoLog for now. Start Levemir 25 units at bedtime. Continue Tradjenta 5 mg in  am. Please send me your sugars through MyChart as you did before, in ~7-10 days. Please return in 1 month with your sugar log.  Please stop at the lab. - will check a HbA1c today - given more sugar logs - up to date with eye exams - I will see him back in 1 month  Office Visit on 12/23/2013  Component Date Value Ref Range Status  . Hemoglobin A1C 12/23/2013 7.5* 4.6 - 6.5 % Final   Glycemic Control Guidelines for People with Diabetes:Non Diabetic:  <6%Goal of Therapy: <7%Additional Action Suggested:  >8%    A1c is a little higher.

## 2013-12-23 NOTE — Patient Instructions (Signed)
Please stop NovoLog for now. Start Levemir 25 units at bedtime. Continue Tradjenta 5 mg in am.  Please send me your sugars through MyChart as you did before, in ~7-10 days.  Please return in 1 month with your sugar log.   Please stop at the lab.

## 2013-12-23 NOTE — Telephone Encounter (Signed)
Possibly, but I d/w pt about this and he just had a high fat meal... I would recommend a fasting one.

## 2013-12-27 ENCOUNTER — Telehealth: Payer: Self-pay

## 2013-12-27 NOTE — Telephone Encounter (Signed)
Senaida Lange is for DM; I did not prescribe this med; please ask that he speak with primary care. He needs ACEI or ARB for cardiomyopathy; ok to continue lisinopril Kirk Ruths

## 2014-02-01 ENCOUNTER — Ambulatory Visit: Payer: No Typology Code available for payment source | Admitting: Internal Medicine

## 2014-02-06 ENCOUNTER — Other Ambulatory Visit: Payer: Self-pay | Admitting: *Deleted

## 2014-02-06 DIAGNOSIS — I428 Other cardiomyopathies: Secondary | ICD-10-CM

## 2014-02-06 MED ORDER — LISINOPRIL 5 MG PO TABS: 5.0000 mg | ORAL_TABLET | Freq: Every day | ORAL | Status: DC

## 2014-02-16 ENCOUNTER — Ambulatory Visit: Payer: No Typology Code available for payment source | Admitting: Internal Medicine

## 2014-03-03 ENCOUNTER — Ambulatory Visit: Payer: No Typology Code available for payment source | Admitting: Internal Medicine

## 2014-03-09 ENCOUNTER — Ambulatory Visit: Payer: No Typology Code available for payment source | Admitting: Internal Medicine

## 2014-03-13 ENCOUNTER — Ambulatory Visit: Payer: No Typology Code available for payment source | Admitting: Internal Medicine

## 2014-04-04 ENCOUNTER — Telehealth: Payer: Self-pay | Admitting: Internal Medicine

## 2014-04-04 ENCOUNTER — Ambulatory Visit: Payer: No Typology Code available for payment source | Admitting: Internal Medicine

## 2014-04-04 MED ORDER — INSULIN DETEMIR 100 UNIT/ML ~~LOC~~ SOLN: 32.0000 [IU] | Freq: Every day | SUBCUTANEOUS | Status: DC

## 2014-04-04 NOTE — Telephone Encounter (Signed)
Pt needs levemir rx called in daily 32 u, he has been out a few

## 2014-04-07 ENCOUNTER — Ambulatory Visit: Payer: No Typology Code available for payment source | Admitting: Internal Medicine

## 2014-04-17 ENCOUNTER — Encounter: Payer: Self-pay | Admitting: Internal Medicine

## 2014-04-17 ENCOUNTER — Ambulatory Visit: Payer: No Typology Code available for payment source | Admitting: Internal Medicine

## 2014-04-29 ENCOUNTER — Ambulatory Visit (INDEPENDENT_AMBULATORY_CARE_PROVIDER_SITE_OTHER): Payer: No Typology Code available for payment source | Admitting: Family Medicine

## 2014-04-29 ENCOUNTER — Encounter: Payer: Self-pay | Admitting: Family Medicine

## 2014-04-29 VITALS — BP 128/82 | HR 81 | Temp 98.4°F | Wt 204.2 lb

## 2014-04-29 DIAGNOSIS — J069 Acute upper respiratory infection, unspecified: Secondary | ICD-10-CM

## 2014-04-29 MED ORDER — HYDROCODONE-HOMATROPINE 5-1.5 MG/5ML PO SYRP: 5.0000 mL | ORAL_SOLUTION | Freq: Three times a day (TID) | ORAL | Status: DC | PRN

## 2014-04-29 NOTE — Progress Notes (Signed)
SUBJECTIVE:  Mike Orozco is a 53 y.o. male pt of Dr. Ronnald Ramp, new to me, with h/o DM, who presents to weekend clinic with coryza, congestion, sneezing, sore throat and dry cough for 3 days. He denies a history of anorexia, chest pain, fatigue and vomiting and denies a history of asthma. Patient denies smoke cigarettes.  FSBS this am 156.  Has been drinking tea and taking antihistamines have helped a little.    Current Outpatient Prescriptions on File Prior to Visit  Medication Sig Dispense Refill  . allopurinol (ZYLOPRIM) 300 MG tablet TAKE 1 TABLET (300 MG TOTAL) BY MOUTH DAILY. 90 tablet 1  . carvedilol (COREG) 25 MG tablet TAKE ONE TABLET BY MOUTH TWICE A DAY 60 tablet 4  . clotrimazole (LOTRIMIN) 1 % cream Apply to affected area 2 times daily for 2 weeks 15 g 0  . fenofibrate 160 MG tablet Take 1 tablet (160 mg total) by mouth at bedtime. 30 tablet 1  . ferrous sulfate 325 (65 FE) MG tablet Take 325 mg by mouth daily with breakfast.    . insulin aspart (NOVOLOG) 100 UNIT/ML injection Inject up to 30 units daily as advised. 5 pen 6  . insulin detemir (LEVEMIR) 100 UNIT/ML injection Inject 0.32 mLs (32 Units total) into the skin daily. 15 mL 0  . Lancets (ONETOUCH ULTRASOFT) lancets Use 2x a day 200 each 11  . linagliptin (TRADJENTA) 5 MG TABS tablet Take 1 tablet (5 mg total) by mouth daily. 30 tablet 3  . lisinopril (PRINIVIL,ZESTRIL) 5 MG tablet Take 1 tablet (5 mg total) by mouth daily. 90 tablet 0  . Multiple Vitamin (MULTIVITAMIN) capsule Take 1 capsule by mouth daily.    . ONE TOUCH ULTRA TEST test strip TEST BLOOD SUGAR BEFORE AND AFTER BREAKFAST,LUND AND DINNER AND AT BEDTIME 700 each 3  . pravastatin (PRAVACHOL) 40 MG tablet Take 40 mg by mouth every evening.     No current facility-administered medications on file prior to visit.    Allergies  Allergen Reactions  . Metformin And Related     Renal issues    Past Medical History  Diagnosis Date  . Heart failure, chronic  systolic   . Non-ischemic cardiomyopathy SECONDARY TO HX UNCONTROLLED HTN AND ETOH ABUSE----  LAST ECHO EF 50%  09-22-2011    a) Echo 03/25/2010: EF 20-25%; mild AI; Mod MR; Mild LAE  b)cardiac cath 03/26/2010: normal cors; severe pulmonary HTN;PCWP 41; EF 10-15%;  c. echo 4/12:  EF 45-50%, mild LVH, grade 1 diast dysfxn, mild AI, mild LAE  . History of ETOH abuse   . Gout, unspecified     stable  per pt  . Pure hyperglyceridemia   . Anemia   . Personal history of colonic polyps-adenoma 02/17/2012    01/2012 - diminutive adenoma Repeat colon about 01/2017    . Heart block AV first degree   . Hypertension     CARDIOLOGIST-  DR Stanford Breed--- LOV IN [facility] 09-08-2011  . Chronic renal insufficiency baseline CRE  1.5--1.8    NEPHROLOGIST--  DR WEBB ([facility])  . Nephrolithiasis     RIGHT  . Polycystic kidney disease     BILATERAL RENAL CYST  . Diabetes mellitus type 2, insulin dependent PER pt and LAST PCP NOTE CBG'S IN 100'    RECENTLY UNCONTROLLED AIC 07-07-2012  9.4 AND CBG'S 400'S  . GERD (gastroesophageal reflux disease)   . Arthritis   . CHF (congestive heart failure)     Past Surgical  History  Procedure Laterality Date  . Lumbar fusion  08-10-2003    L5  --  S1  . Removal cyst left lung  1994   AT [facility]    benign  . Knee arthroscopy w/ meniscectomy Left 02-06-2011    AND CHONDROPLASTY  . Cardiac catheterization  03-26-2010  DR [place]    SEVERE LV DYSFUNCTION WITH MARKEDLY ELEVATED FILLING PRESSURE/ SEVERE PULMONARY HYPERTENSION/ NORMAL CORONARY ANATOMY  . Transthoracic echocardiogram  09-22-2011  DR CRENSHAW    EF 50%/  MILD LVH/ GRADE I DIASTOLIC DYSFUNCTION/ MILD TR  . Rhinoplasty  1970'S    nose fx  . Cystoscopy with retrograde pyelogram, ureteroscopy and stent placement Right 08/18/2012    Procedure: CYSTOSCOPY WITH RETROGRADE PYELOGRAM, URETEROSCOPY AND STENT PLACEMENT;  Surgeon: Molli Hazard, MD;  Location: [facility];  Service: Urology;   Laterality: Right;  . Colonoscopy    . Nephrolithotomy Right 09/15/2012    Procedure: NEPHROLITHOTOMY PERCUTANEOUS;  Surgeon: Molli Hazard, MD;  Location: [facility];  Service: Urology;  Laterality: Right;  RIGHT PERCUTANEOUS NEPHROSTOLITHOTOMY   . Kidney stone surgery      X 2    Family History  Problem Relation Age of Onset  . Diabetes Mother   . Hypertension Mother   . Diabetes Father   . Hypertension Father   . Colon cancer Neg Hx     History   Social History  . Marital Status: Married    Spouse Name: N/A    Number of Children: 1  . Years of Education: N/A   Occupational History  . [employer]   Social History Main Topics  . Smoking status: Former Smoker -- 0.50 packs/day for 32 years    Types: Cigarettes    Quit date: 02/27/2007  . Smokeless tobacco: Never Used  . Alcohol Use: No     Comment: HX ALCOHOL ABUSE--  STATES  Quit 02/2010  . Drug Use: No  . Sexual Activity: Not on file   Other Topics Concern  . Not on file   Social History Narrative   No regular exercise   Daily caffeine         The PMH, PSH, Social History, Family History, Medications, and allergies have been reviewed in [facility], and have been updated if relevant.    OBJECTIVE: BP 128/82 mmHg  Pulse 81  Temp(Src) 98.4 F (36.9 C) (Oral)  Wt 204 lb 4 oz (92.647 kg)  SpO2 96%  He appears well, vital signs are as noted. Ears normal.  Throat and pharynx normal.  Neck supple. No adenopathy in the neck. Nose is congested. Sinuses non tender. The chest is clear, without wheezes or rales.  ASSESSMENT:  viral upper respiratory illness  PLAN: Symptomatic therapy suggested: push fluids, rest and return office visit prn if symptoms persist or worsen. Lack of antibiotic effectiveness discussed with him. Call or return to clinic prn if these symptoms worsen or fail to improve as anticipated.

## 2014-04-29 NOTE — Progress Notes (Signed)
Pre visit review using our clinic review tool, if applicable. No additional management support is needed unless otherwise documented below in the visit note. 

## 2014-05-23 ENCOUNTER — Ambulatory Visit: Payer: No Typology Code available for payment source | Admitting: Internal Medicine

## 2014-05-26 ENCOUNTER — Ambulatory Visit: Payer: No Typology Code available for payment source

## 2014-06-09 ENCOUNTER — Ambulatory Visit: Payer: No Typology Code available for payment source

## 2014-06-15 ENCOUNTER — Ambulatory Visit: Payer: No Typology Code available for payment source | Admitting: Internal Medicine

## 2014-06-29 ENCOUNTER — Ambulatory Visit: Payer: No Typology Code available for payment source | Admitting: Internal Medicine

## 2014-07-06 ENCOUNTER — Ambulatory Visit: Payer: No Typology Code available for payment source | Admitting: Internal Medicine

## 2014-07-12 ENCOUNTER — Telehealth: Payer: Self-pay | Admitting: Cardiology

## 2014-07-12 NOTE — Telephone Encounter (Signed)
Received records from [facility] for appointment on 09/08/14 with Dr Stanford Breed.  Records given to [facility] (medical records) for Dr Jacalyn Lefevre schedule on 09/08/14.  lp

## 2014-07-30 ENCOUNTER — Emergency Department (INDEPENDENT_AMBULATORY_CARE_PROVIDER_SITE_OTHER)
Admission: EM | Admit: 2014-07-30 | Discharge: 2014-07-30 | Disposition: A | Discharge status: Home / Self Care | Payer: BLUE CROSS/BLUE SHIELD | Source: Home / Self Care | Attending: Family Medicine | Admitting: Family Medicine

## 2014-07-30 ENCOUNTER — Encounter (HOSPITAL_COMMUNITY): Payer: Self-pay | Admitting: *Deleted

## 2014-07-30 DIAGNOSIS — J40 Bronchitis, not specified as acute or chronic: Secondary | ICD-10-CM

## 2014-07-30 MED ORDER — ALBUTEROL SULFATE HFA 108 (90 BASE) MCG/ACT IN AERS: 2.0000 | INHALATION_SPRAY | Freq: Four times a day (QID) | RESPIRATORY_TRACT | Status: DC | PRN

## 2014-07-30 MED ORDER — PROMETHAZINE-CODEINE 6.25-10 MG/5ML PO SYRP
5.0000 mL | ORAL_SOLUTION | Freq: Every evening | ORAL | Status: DC | PRN
Start: 2014-07-30 — End: 2015-11-12

## 2014-07-30 MED ORDER — IPRATROPIUM-ALBUTEROL 0.5-2.5 (3) MG/3ML IN SOLN
3.0000 mL | Freq: Once | RESPIRATORY_TRACT | Status: AC
  Administered 2014-07-30: 3 mL via RESPIRATORY_TRACT

## 2014-07-30 MED ORDER — PREDNISONE 10 MG PO TABS: 30.0000 mg | ORAL_TABLET | Freq: Every day | ORAL | Status: DC

## 2014-07-30 MED ORDER — IPRATROPIUM-ALBUTEROL 0.5-2.5 (3) MG/3ML IN SOLN
RESPIRATORY_TRACT | Status: AC
  Filled 2014-07-30: qty 3

## 2014-07-30 MED ORDER — SODIUM CHLORIDE 0.9 % IN NEBU
INHALATION_SOLUTION | RESPIRATORY_TRACT | Status: AC
  Filled 2014-07-30: qty 3

## 2014-07-30 NOTE — ED Provider Notes (Signed)
Mike Orozco is a 54 y.o. male who presents to Urgent Care today for Coughing and wheezing present for 2 days. No chest pains palpitations shortness of breath or leg swelling. Patient has tried some over-the-counter medications which help. No fevers or chills. Patient has a history of smoking.   Past Medical History  Diagnosis Date  . Heart failure, chronic systolic   . Non-ischemic cardiomyopathy SECONDARY TO HX UNCONTROLLED HTN AND ETOH ABUSE----  LAST ECHO EF 50%  09-22-2011    a) Echo 03/25/2010: EF 20-25%; mild AI; Mod MR; Mild LAE  b)cardiac cath 03/26/2010: normal cors; severe pulmonary HTN;PCWP 41; EF 10-15%;  c. echo 4/12:  EF 45-50%, mild LVH, grade 1 diast dysfxn, mild AI, mild LAE  . History of ETOH abuse   . Gout, unspecified     stable  per pt  . Pure hyperglyceridemia   . Anemia   . Personal history of colonic polyps-adenoma 02/17/2012    01/2012 - diminutive adenoma Repeat colon about 01/2017    . Heart block AV first degree   . Hypertension     CARDIOLOGIST-  DR Stanford Breed--- LOV IN [facility] 09-08-2011  . Diabetes mellitus type 2, insulin dependent PER pt and LAST PCP NOTE CBG'S IN 100'    RECENTLY UNCONTROLLED AIC 07-07-2012  9.4 AND CBG'S 400'S  . GERD (gastroesophageal reflux disease)   . Arthritis   . CHF (congestive heart failure)   . Chronic renal insufficiency baseline CRE  1.5--1.8    NEPHROLOGIST--  DR WEBB ([facility])  . Nephrolithiasis     RIGHT  . Polycystic kidney disease     BILATERAL RENAL CYST   Past Surgical History  Procedure Laterality Date  . Lumbar fusion  08-10-2003    L5  --  S1  . Removal cyst left lung  1994   AT [facility]    benign  . Knee arthroscopy w/ meniscectomy Left 02-06-2011    AND CHONDROPLASTY  . Transthoracic echocardiogram  09-22-2011  DR CRENSHAW    EF 50%/  MILD LVH/ GRADE I DIASTOLIC DYSFUNCTION/ MILD TR  . Rhinoplasty  1970'S    nose fx  . Cystoscopy with retrograde pyelogram, ureteroscopy and stent placement Right 08/18/2012     Procedure: CYSTOSCOPY WITH RETROGRADE PYELOGRAM, URETEROSCOPY AND STENT PLACEMENT;  Surgeon: Molli Hazard, MD;  Location: [facility];  Service: Urology;  Laterality: Right;  . Colonoscopy    . Nephrolithotomy Right 09/15/2012    Procedure: NEPHROLITHOTOMY PERCUTANEOUS;  Surgeon: Molli Hazard, MD;  Location: [facility];  Service: Urology;  Laterality: Right;  RIGHT PERCUTANEOUS NEPHROSTOLITHOTOMY   . Kidney stone surgery      X 2  . Cardiac catheterization  03-26-2010  DR Martinique    SEVERE LV DYSFUNCTION WITH MARKEDLY ELEVATED FILLING PRESSURE/ SEVERE PULMONARY HYPERTENSION/ NORMAL CORONARY ANATOMY   History  Substance Use Topics  . Smoking status: Current Every Day Smoker -- 0.00 packs/day for 32 years    Types: Cigars    Last Attempt to Quit: 02/27/2007  . Smokeless tobacco: Never Used  . Alcohol Use: No     Comment: HX ALCOHOL ABUSE--  STATES  Quit 02/2010   ROS as above Medications: Current Facility-Administered Medications  Medication Dose Route Frequency Provider Last Rate Last Dose  . ipratropium-albuterol (DUONEB) 0.5-2.5 (3) MG/3ML nebulizer solution 3 mL  3 mL Nebulization Once Gregor Hams, MD       Current Outpatient Prescriptions  Medication Sig Dispense Refill  . carvedilol (  COREG) 25 MG tablet TAKE ONE TABLET BY MOUTH TWICE A DAY 60 tablet 4  . ferrous sulfate 325 (65 FE) MG tablet Take 325 mg by mouth daily with breakfast.    . insulin detemir (LEVEMIR) 100 UNIT/ML injection Inject 0.32 mLs (32 Units total) into the skin daily. (Patient taking differently: Inject 8 Units into the skin daily. ) 15 mL 0  . Lancets (ONETOUCH ULTRASOFT) lancets Use 2x a day 200 each 11  . lisinopril (PRINIVIL,ZESTRIL) 5 MG tablet Take 1 tablet (5 mg total) by mouth daily. 90 tablet 0  . Multiple Vitamin (MULTIVITAMIN) capsule Take 1 capsule by mouth daily.    . ONE TOUCH ULTRA TEST test strip TEST BLOOD SUGAR BEFORE AND AFTER BREAKFAST,LUND AND DINNER AND AT  BEDTIME 700 each 3  . allopurinol (ZYLOPRIM) 300 MG tablet TAKE 1 TABLET (300 MG TOTAL) BY MOUTH DAILY. 90 tablet 1  . clotrimazole (LOTRIMIN) 1 % cream Apply to affected area 2 times daily for 2 weeks 15 g 0  . fenofibrate 160 MG tablet Take 1 tablet (160 mg total) by mouth at bedtime. 30 tablet 1  . HYDROcodone-homatropine (HYCODAN) 5-1.5 MG/5ML syrup Take 5 mLs by mouth every 8 (eight) hours as needed for cough. 120 mL 0  . insulin aspart (NOVOLOG) 100 UNIT/ML injection Inject up to 30 units daily as advised. 5 pen 6  . linagliptin (TRADJENTA) 5 MG TABS tablet Take 1 tablet (5 mg total) by mouth daily. 30 tablet 3  . pravastatin (PRAVACHOL) 40 MG tablet Take 40 mg by mouth every evening.     Allergies  Allergen Reactions  . Metformin And Related     Renal issues     Exam:  BP 112/76 mmHg  Pulse 77  Temp(Src) 99.5 F (37.5 C) (Oral)  Resp 16  SpO2 93% Gen: Well NAD HEENT: EOMI,  MMM, No JVD. Clear nasal discharge. Posterior pharynx cobblestoning. Lungs: Normal work of breathing. CTABL Heart: RRR no MRG Abd: NABS, Soft. Nondistended, Nontender Exts: Brisk capillary refill, warm and well perfused. nonedematous  Patient was given a 2.5/0.5 mg DuoNeb nebulizer treatment, and felt much better  No results found for this or any previous visit (from the past 24 hour(s)). No results found.  Assessment and Plan: 54 y.o. male with bronchitis. Patient smokes and likely has viral bronchitis exacerbating existing reactive airway disease. I'm doubtful for his symptoms are related to CHF as he does not have any rales on lung exam, lower extremity edema, or increased JVD. Plan to treat with prednisone, albuterol, and codeine with Phenergan cough syrup. Warned patient about increased blood sugar while taking prednisone. We'll use a moderate dose for only 5 days to reduce hyperglycemia risk.  Discussed warning signs or symptoms. Please see discharge instructions. Patient expresses  understanding.     Gregor Hams, MD 07/30/14 [phone]

## 2014-07-30 NOTE — ED Notes (Signed)
C/O audible "wheezing when I lay down"; slightly productive cough with chest congestion x 2 days with chills.  Has taken Coricidin HBP.  Denies any pain.

## 2014-07-30 NOTE — Discharge Instructions (Signed)
Thank you for coming in today. Take the prednisone daily for lung inflammation. This medicine will increase your blood sugars to monitor your blood sugar and call your primary care doctor if your sugar gets elevated. Use albuterol every 4-6 hours as needed for wheezing or coughing. Use the cough medicine at bedtime as needed. Do not drive or operate heavy machinery after taking this medication. Call or go to the emergency room if you get worse, have trouble breathing, have chest pains, or palpitations.  Please quit smoking.  Acute Bronchitis Bronchitis is inflammation of the airways that extend from the windpipe into the lungs (bronchi). The inflammation often causes mucus to develop. This leads to a cough, which is the most common symptom of bronchitis.  In acute bronchitis, the condition usually develops suddenly and goes away over time, usually in a couple weeks. Smoking, allergies, and asthma can make bronchitis worse. Repeated episodes of bronchitis may cause further lung problems.  CAUSES Acute bronchitis is most often caused by the same virus that causes a cold. The virus can spread from person to person (contagious) through coughing, sneezing, and touching contaminated objects. SIGNS AND SYMPTOMS   Cough.   Fever.   Coughing up mucus.   Body aches.   Chest congestion.   Chills.   Shortness of breath.   Sore throat.  DIAGNOSIS  Acute bronchitis is usually diagnosed through a physical exam. Your health care provider will also ask you questions about your medical history. Tests, such as chest X-rays, are sometimes done to rule out other conditions.  TREATMENT  Acute bronchitis usually goes away in a couple weeks. Oftentimes, no medical treatment is necessary. Medicines are sometimes given for relief of fever or cough. Antibiotic medicines are usually not needed but may be prescribed in certain situations. In some cases, an inhaler may be recommended to help reduce  shortness of breath and control the cough. A cool mist vaporizer may also be used to help thin bronchial secretions and make it easier to clear the chest.  HOME CARE INSTRUCTIONS  Get plenty of rest.   Drink enough fluids to keep your urine clear or pale yellow (unless you have a medical condition that requires fluid restriction). Increasing fluids may help thin your respiratory secretions (sputum) and reduce chest congestion, and it will prevent dehydration.   Take medicines only as directed by your health care provider.  If you were prescribed an antibiotic medicine, finish it all even if you start to feel better.  Avoid smoking and secondhand smoke. Exposure to cigarette smoke or irritating chemicals will make bronchitis worse. If you are a smoker, consider using nicotine gum or skin patches to help control withdrawal symptoms. Quitting smoking will help your lungs heal faster.   Reduce the chances of another bout of acute bronchitis by washing your hands frequently, avoiding people with cold symptoms, and trying not to touch your hands to your mouth, nose, or eyes.   Keep all follow-up visits as directed by your health care provider.  SEEK MEDICAL CARE IF: Your symptoms do not improve after 1 week of treatment.  SEEK IMMEDIATE MEDICAL CARE IF:  You develop an increased fever or chills.   You have chest pain.   You have severe shortness of breath.  You have bloody sputum.   You develop dehydration.  You faint or repeatedly feel like you are going to pass out.  You develop repeated vomiting.  You develop a severe headache. MAKE SURE YOU:   Understand  these instructions.  Will watch your condition.  Will get help right away if you are not doing well or get worse. Document Released: 06/12/2004 Document Revised: 09/19/2013 Document Reviewed: 10/26/2012 [facility] Patient Information 2015 [place], [place]. This information is not intended to replace advice given to you  by your health care provider. Make sure you discuss any questions you have with your health care provider.

## 2014-07-31 ENCOUNTER — Telehealth: Payer: Self-pay

## 2014-07-31 NOTE — Telephone Encounter (Signed)
Status of flu vaccine not obtained, the telephone number has been disconnected.

## 2014-08-13 ENCOUNTER — Other Ambulatory Visit: Payer: Self-pay | Admitting: Cardiology

## 2014-09-04 NOTE — Progress Notes (Signed)
HPI: FU nonischemic cardiomyopathy with EF initially at 15-20%. Cardiac catheterization in 03/2010 revealed normal coronary arteries. Last echocardiogram in June 2015 showed normal LV function, grade 1 diastolic dysfunction and trace aortic insufficiency. Mildly dilated aortic root. Since last seen,   Current Outpatient Prescriptions  Medication Sig Dispense Refill  . albuterol (PROVENTIL HFA;VENTOLIN HFA) 108 (90 BASE) MCG/ACT inhaler Inhale 2 puffs into the lungs every 6 (six) hours as needed for wheezing or shortness of breath. 1 Inhaler 2  . carvedilol (COREG) 25 MG tablet TAKE ONE TABLET BY MOUTH TWICE A DAY 60 tablet 4  . fenofibrate 160 MG tablet Take 1 tablet (160 mg total) by mouth at bedtime. 30 tablet 1  . ferrous sulfate 325 (65 FE) MG tablet Take 325 mg by mouth daily with breakfast.    . insulin detemir (LEVEMIR) 100 UNIT/ML injection Inject 0.32 mLs (32 Units total) into the skin daily. (Patient taking differently: Inject 8 Units into the skin daily. ) 15 mL 0  . Lancets (ONETOUCH ULTRASOFT) lancets Use 2x a day 200 each 11  . lisinopril (PRINIVIL,ZESTRIL) 5 MG tablet Take 1 tablet (5 mg total) by mouth daily. 90 tablet 0  . Multiple Vitamin (MULTIVITAMIN) capsule Take 1 capsule by mouth daily.    . ONE TOUCH ULTRA TEST test strip TEST BLOOD SUGAR BEFORE AND AFTER BREAKFAST,LUND AND DINNER AND AT BEDTIME 700 each 3  . predniSONE (DELTASONE) 10 MG tablet Take 3 tablets (30 mg total) by mouth daily. 15 tablet 0  . promethazine-codeine (PHENERGAN WITH CODEINE) 6.25-10 MG/5ML syrup Take 5 mLs by mouth at bedtime as needed for cough. 120 mL 0  . [DISCONTINUED] allopurinol (ZYLOPRIM) 300 MG tablet TAKE 1 TABLET (300 MG TOTAL) BY MOUTH DAILY. 90 tablet 1   No current facility-administered medications for this visit.     Past Medical History  Diagnosis Date  . Heart failure, chronic systolic   . Non-ischemic cardiomyopathy SECONDARY TO HX UNCONTROLLED HTN AND ETOH ABUSE----   LAST ECHO EF 50%  09-22-2011    a) Echo 03/25/2010: EF 20-25%; mild AI; Mod MR; Mild LAE  b)cardiac cath 03/26/2010: normal cors; severe pulmonary HTN;PCWP 41; EF 10-15%;  c. echo 4/12:  EF 45-50%, mild LVH, grade 1 diast dysfxn, mild AI, mild LAE  . History of ETOH abuse   . Gout, unspecified     stable  per pt  . Pure hyperglyceridemia   . Anemia   . Personal history of colonic polyps-adenoma 02/17/2012    01/2012 - diminutive adenoma Repeat colon about 01/2017    . Heart block AV first degree   . Hypertension     CARDIOLOGIST-  DR Stanford Breed--- LOV IN [facility] 09-08-2011  . Diabetes mellitus type 2, insulin dependent PER pt and LAST PCP NOTE CBG'S IN 100'    RECENTLY UNCONTROLLED AIC 07-07-2012  9.4 AND CBG'S 400'S  . GERD (gastroesophageal reflux disease)   . Arthritis   . CHF (congestive heart failure)   . Chronic renal insufficiency baseline CRE  1.5--1.8    NEPHROLOGIST--  DR WEBB ([facility])  . Nephrolithiasis     RIGHT  . Polycystic kidney disease     BILATERAL RENAL CYST    Past Surgical History  Procedure Laterality Date  . Lumbar fusion  08-10-2003    L5  --  S1  . Removal cyst left lung  1994   AT [facility]    benign  . Knee arthroscopy w/ meniscectomy Left 02-06-2011  AND CHONDROPLASTY  . Transthoracic echocardiogram  09-22-2011  DR Draxton Luu    EF 50%/  MILD LVH/ GRADE I DIASTOLIC DYSFUNCTION/ MILD TR  . Rhinoplasty  1970'S    nose fx  . Cystoscopy with retrograde pyelogram, ureteroscopy and stent placement Right 08/18/2012    Procedure: CYSTOSCOPY WITH RETROGRADE PYELOGRAM, URETEROSCOPY AND STENT PLACEMENT;  Surgeon: Molli Hazard, MD;  Location: [facility];  Service: Urology;  Laterality: Right;  . Colonoscopy    . Nephrolithotomy Right 09/15/2012    Procedure: NEPHROLITHOTOMY PERCUTANEOUS;  Surgeon: Molli Hazard, MD;  Location: [facility];  Service: Urology;  Laterality: Right;  RIGHT PERCUTANEOUS NEPHROSTOLITHOTOMY   . Kidney stone  surgery      X 2  . Cardiac catheterization  03-26-2010  DR Martinique    SEVERE LV DYSFUNCTION WITH MARKEDLY ELEVATED FILLING PRESSURE/ SEVERE PULMONARY HYPERTENSION/ NORMAL CORONARY ANATOMY    History   Social History  . Marital Status: Married    Spouse Name: N/A  . Number of Children: 1  . Years of Education: N/A   Occupational History  . [employer]   Social History Main Topics  . Smoking status: Current Every Day Smoker -- 0.00 packs/day for 32 years    Types: Cigars    Last Attempt to Quit: 02/27/2007  . Smokeless tobacco: Never Used  . Alcohol Use: No     Comment: HX ALCOHOL ABUSE--  STATES  Quit 02/2010  . Drug Use: No  . Sexual Activity: Not on file   Other Topics Concern  . Not on file   Social History Narrative   No regular exercise   Daily caffeine          ROS: no fevers or chills, productive cough, hemoptysis, dysphasia, odynophagia, melena, hematochezia, dysuria, hematuria, rash, seizure activity, orthopnea, PND, pedal edema, claudication. Remaining systems are negative.  Physical Exam: Well-developed well-nourished in no acute distress.  Skin is warm and dry.  HEENT is normal.  Neck is supple.  Chest is clear to auscultation with normal expansion.  Cardiovascular exam is regular rate and rhythm.  Abdominal exam nontender or distended. No masses palpated. Extremities show no edema. neuro grossly intact  ECG     This encounter was created in error - please disregard.

## 2014-09-08 ENCOUNTER — Encounter: Payer: No Typology Code available for payment source | Admitting: Cardiology

## 2014-10-06 ENCOUNTER — Other Ambulatory Visit: Payer: Self-pay | Admitting: Cardiology

## 2014-10-09 ENCOUNTER — Ambulatory Visit: Payer: BLUE CROSS/BLUE SHIELD | Admitting: Physician Assistant

## 2014-11-03 ENCOUNTER — Telehealth: Payer: Self-pay | Admitting: *Deleted

## 2014-11-03 DIAGNOSIS — I428 Other cardiomyopathies: Secondary | ICD-10-CM

## 2014-11-06 NOTE — Telephone Encounter (Signed)
Pt is asking for Lisinopril refill, its on their list but it wouldn't let me order it, please advise.Marland KitchenMarland Kitchen

## 2014-11-07 MED ORDER — LISINOPRIL 5 MG PO TABS: 5.0000 mg | ORAL_TABLET | Freq: Every day | ORAL | Status: DC

## 2014-11-13 ENCOUNTER — Other Ambulatory Visit: Payer: Self-pay

## 2014-11-19 NOTE — Progress Notes (Signed)
HPI: FU nonischemic cardiomyopathy with EF initially at 15-20%. Cardiac catheterization in 03/2010 revealed normal coronary arteries. Last echocardiogram in June 2015 showed improved LV function with an ejection fraction of 50-55% and grade 1 diastolic dysfunction; mildly dilated aortic root; trace AI. Since last seen,   Current Outpatient Prescriptions  Medication Sig Dispense Refill  . albuterol (PROVENTIL HFA;VENTOLIN HFA) 108 (90 BASE) MCG/ACT inhaler Inhale 2 puffs into the lungs every 6 (six) hours as needed for wheezing or shortness of breath. 1 Inhaler 2  . carvedilol (COREG) 25 MG tablet TAKE ONE TABLET BY MOUTH TWICE A DAY 60 tablet 4  . fenofibrate 160 MG tablet Take 1 tablet (160 mg total) by mouth at bedtime. 30 tablet 1  . ferrous sulfate 325 (65 FE) MG tablet Take 325 mg by mouth daily with breakfast.    . insulin detemir (LEVEMIR) 100 UNIT/ML injection Inject 0.32 mLs (32 Units total) into the skin daily. (Patient taking differently: Inject 8 Units into the skin daily. ) 15 mL 0  . Lancets (ONETOUCH ULTRASOFT) lancets Use 2x a day 200 each 11  . lisinopril (PRINIVIL,ZESTRIL) 5 MG tablet Take 1 tablet (5 mg total) by mouth daily. 90 tablet 0  . Multiple Vitamin (MULTIVITAMIN) capsule Take 1 capsule by mouth daily.    . ONE TOUCH ULTRA TEST test strip TEST BLOOD SUGAR BEFORE AND AFTER BREAKFAST,LUND AND DINNER AND AT BEDTIME 700 each 3  . predniSONE (DELTASONE) 10 MG tablet Take 3 tablets (30 mg total) by mouth daily. 15 tablet 0  . promethazine-codeine (PHENERGAN WITH CODEINE) 6.25-10 MG/5ML syrup Take 5 mLs by mouth at bedtime as needed for cough. 120 mL 0  . [DISCONTINUED] allopurinol (ZYLOPRIM) 300 MG tablet TAKE 1 TABLET (300 MG TOTAL) BY MOUTH DAILY. 90 tablet 1   No current facility-administered medications for this visit.     Past Medical History  Diagnosis Date  . Heart failure, chronic systolic   . Non-ischemic cardiomyopathy SECONDARY TO HX UNCONTROLLED HTN  AND ETOH ABUSE----  LAST ECHO EF 50%  09-22-2011    a) Echo 03/25/2010: EF 20-25%; mild AI; Mod MR; Mild LAE  b)cardiac cath 03/26/2010: normal cors; severe pulmonary HTN;PCWP 41; EF 10-15%;  c. echo 4/12:  EF 45-50%, mild LVH, grade 1 diast dysfxn, mild AI, mild LAE  . History of ETOH abuse   . Gout, unspecified     stable  per pt  . Pure hyperglyceridemia   . Anemia   . Personal history of colonic polyps-adenoma 02/17/2012    01/2012 - diminutive adenoma Repeat colon about 01/2017    . Heart block AV first degree   . Hypertension     CARDIOLOGIST-  DR Stanford Breed--- LOV IN [facility] 09-08-2011  . Diabetes mellitus type 2, insulin dependent PER pt and LAST PCP NOTE CBG'S IN 100'    RECENTLY UNCONTROLLED AIC 07-07-2012  9.4 AND CBG'S 400'S  . GERD (gastroesophageal reflux disease)   . Arthritis   . CHF (congestive heart failure)   . Chronic renal insufficiency baseline CRE  1.5--1.8    NEPHROLOGIST--  DR WEBB ([facility])  . Nephrolithiasis     RIGHT  . Polycystic kidney disease     BILATERAL RENAL CYST    Past Surgical History  Procedure Laterality Date  . Lumbar fusion  08-10-2003    L5  --  S1  . Removal cyst left lung  1994   AT [facility]    benign  . Knee  arthroscopy w/ meniscectomy Left 02-06-2011    AND CHONDROPLASTY  . Transthoracic echocardiogram  09-22-2011  DR CRENSHAW    EF 50%/  MILD LVH/ GRADE I DIASTOLIC DYSFUNCTION/ MILD TR  . Rhinoplasty  1970'S    nose fx  . Cystoscopy with retrograde pyelogram, ureteroscopy and stent placement Right 08/18/2012    Procedure: CYSTOSCOPY WITH RETROGRADE PYELOGRAM, URETEROSCOPY AND STENT PLACEMENT;  Surgeon: Molli Hazard, MD;  Location: [facility];  Service: Urology;  Laterality: Right;  . Colonoscopy    . Nephrolithotomy Right 09/15/2012    Procedure: NEPHROLITHOTOMY PERCUTANEOUS;  Surgeon: Molli Hazard, MD;  Location[facility];  Service: Urology;  Laterality: Right;  RIGHT PERCUTANEOUS NEPHROSTOLITHOTOMY   .  Kidney stone surgery      X 2  . Cardiac catheterization  03-26-2010  DR Martinique    SEVERE LV DYSFUNCTION WITH MARKEDLY ELEVATED FILLING PRESSURE/ SEVERE PULMONARY HYPERTENSION/ NORMAL CORONARY ANATOMY    History   Social History  . Marital Status: Married    Spouse Name: N/A  . Number of Children: 1  . Years of Education: N/A   Occupational History  . [employer]   Social History Main Topics  . Smoking status: Current Every Day Smoker -- 0.00 packs/day for 32 years    Types: Cigars    Last Attempt to Quit: 02/27/2007  . Smokeless tobacco: Never Used  . Alcohol Use: No     Comment: HX ALCOHOL ABUSE--  STATES  Quit 02/2010  . Drug Use: No  . Sexual Activity: Not on file   Other Topics Concern  . Not on file   Social History Narrative   No regular exercise   Daily caffeine          ROS: no fevers or chills, productive cough, hemoptysis, dysphasia, odynophagia, melena, hematochezia, dysuria, hematuria, rash, seizure activity, orthopnea, PND, pedal edema, claudication. Remaining systems are negative.  Physical Exam: Well-developed well-nourished in no acute distress.  Skin is warm and dry.  HEENT is normal.  Neck is supple.  Chest is clear to auscultation with normal expansion.  Cardiovascular exam is regular rate and rhythm.  Abdominal exam nontender or distended. No masses palpated. Extremities show no edema. neuro grossly intact  ECG     This encounter was created in error - please disregard.

## 2014-11-21 ENCOUNTER — Encounter: Payer: BLUE CROSS/BLUE SHIELD | Admitting: Cardiology

## 2015-01-23 ENCOUNTER — Ambulatory Visit: Payer: BLUE CROSS/BLUE SHIELD | Admitting: Cardiology

## 2015-02-24 ENCOUNTER — Other Ambulatory Visit: Payer: Self-pay | Admitting: Cardiology

## 2015-02-26 NOTE — Telephone Encounter (Signed)
REFILL 

## 2015-03-15 ENCOUNTER — Telehealth: Payer: Self-pay | Admitting: Cardiology

## 2015-03-16 NOTE — Telephone Encounter (Signed)
Close encounter 

## 2015-03-19 ENCOUNTER — Ambulatory Visit: Payer: BLUE CROSS/BLUE SHIELD | Admitting: Cardiology

## 2015-03-22 ENCOUNTER — Other Ambulatory Visit: Payer: Self-pay | Admitting: Cardiology

## 2015-04-19 NOTE — Progress Notes (Signed)
HPI: FU nonischemic cardiomyopathy with EF initially at 15-20%. Cardiac catheterization in 03/2010 revealed normal coronary arteries. Last echocardiogram June 2015 showed normal LV function, mild left ventricular hypertrophy, grade 1 diastolic dysfunction and trace aortic insufficiency. Since last seen,   Current Outpatient Prescriptions  Medication Sig Dispense Refill  . albuterol (PROVENTIL HFA;VENTOLIN HFA) 108 (90 BASE) MCG/ACT inhaler Inhale 2 puffs into the lungs every 6 (six) hours as needed for wheezing or shortness of breath. 1 Inhaler 2  . carvedilol (COREG) 25 MG tablet TAKE ONE TABLET BY MOUTH TWICE A DAY 60 tablet 4  . fenofibrate 160 MG tablet Take 1 tablet (160 mg total) by mouth at bedtime. 30 tablet 1  . ferrous sulfate 325 (65 FE) MG tablet Take 325 mg by mouth daily with breakfast.    . insulin detemir (LEVEMIR) 100 UNIT/ML injection Inject 0.32 mLs (32 Units total) into the skin daily. (Patient taking differently: Inject 8 Units into the skin daily. ) 15 mL 0  . Lancets (ONETOUCH ULTRASOFT) lancets Use 2x a day 200 each 11  . lisinopril (PRINIVIL,ZESTRIL) 5 MG tablet TAKE 1 TABLET (5 MG TOTAL) BY MOUTH DAILY. 90 tablet 0  . Multiple Vitamin (MULTIVITAMIN) capsule Take 1 capsule by mouth daily.    . ONE TOUCH ULTRA TEST test strip TEST BLOOD SUGAR BEFORE AND AFTER BREAKFAST,LUND AND DINNER AND AT BEDTIME 700 each 3  . predniSONE (DELTASONE) 10 MG tablet Take 3 tablets (30 mg total) by mouth daily. 15 tablet 0  . promethazine-codeine (PHENERGAN WITH CODEINE) 6.25-10 MG/5ML syrup Take 5 mLs by mouth at bedtime as needed for cough. 120 mL 0  . [DISCONTINUED] allopurinol (ZYLOPRIM) 300 MG tablet TAKE 1 TABLET (300 MG TOTAL) BY MOUTH DAILY. 90 tablet 1   No current facility-administered medications for this visit.     Past Medical History  Diagnosis Date  . Heart failure, chronic systolic   . Non-ischemic cardiomyopathy ([place]) SECONDARY TO HX UNCONTROLLED HTN AND ETOH  ABUSE----  LAST ECHO EF 50%  09-22-2011    a) Echo 03/25/2010: EF 20-25%; mild AI; Mod MR; Mild LAE  b)cardiac cath 03/26/2010: normal cors; severe pulmonary HTN;PCWP 41; EF 10-15%;  c. echo 4/12:  EF 45-50%, mild LVH, grade 1 diast dysfxn, mild AI, mild LAE  . History of ETOH abuse   . Gout, unspecified     stable  per pt  . Pure hyperglyceridemia   . Anemia   . Personal history of colonic polyps-adenoma 02/17/2012    01/2012 - diminutive adenoma Repeat colon about 01/2017    . Heart block AV first degree   . Hypertension     CARDIOLOGIST-  DR Stanford Breed--- LOV IN [facility] 09-08-2011  . Diabetes mellitus type 2, insulin dependent PER pt and LAST PCP NOTE CBG'S IN 100'    RECENTLY UNCONTROLLED AIC 07-07-2012  9.4 AND CBG'S 400'S  . GERD (gastroesophageal reflux disease)   . Arthritis   . CHF (congestive heart failure)   . Chronic renal insufficiency baseline CRE  1.5--1.8    NEPHROLOGIST--  DR WEBB ([facility])  . Nephrolithiasis     RIGHT  . Polycystic kidney disease     BILATERAL RENAL CYST    Past Surgical History  Procedure Laterality Date  . Lumbar fusion  08-10-2003    L5  --  S1  . Removal cyst left lung  1994   AT [facility]    benign  . Knee arthroscopy w/ meniscectomy Left 02-06-2011  AND CHONDROPLASTY  . Transthoracic echocardiogram  09-22-2011  DR CRENSHAW    EF 50%/  MILD LVH/ GRADE I DIASTOLIC DYSFUNCTION/ MILD TR  . Rhinoplasty  1970'S    nose fx  . Cystoscopy with retrograde pyelogram, ureteroscopy and stent placement Right 08/18/2012    Procedure: CYSTOSCOPY WITH RETROGRADE PYELOGRAM, URETEROSCOPY AND STENT PLACEMENT;  Surgeon: Molli Hazard, MD;  [facility];  Service: Urology;  Laterality: Right;  . Colonoscopy    . Nephrolithotomy Right 09/15/2012    Procedure: NEPHROLITHOTOMY PERCUTANEOUS;  Surgeon: Molli Hazard, MD;  Location: [facility];  Service: Urology;  Laterality: Right;  RIGHT PERCUTANEOUS NEPHROSTOLITHOTOMY   . Kidney  stone surgery      X 2  . Cardiac catheterization  03-26-2010  DR Martinique    SEVERE LV DYSFUNCTION WITH MARKEDLY ELEVATED FILLING PRESSURE/ SEVERE PULMONARY HYPERTENSION/ NORMAL CORONARY ANATOMY    Social History   Social History  . Marital Status: Married    Spouse Name: N/A  . Number of Children: 1  . Years of Education: N/A   Occupational History  . [employer]   Social History Main Topics  . Smoking status: Current Every Day Smoker -- 0.00 packs/day for 32 years    Types: Cigars    Last Attempt to Quit: 02/27/2007  . Smokeless tobacco: Never Used  . Alcohol Use: No     Comment: HX ALCOHOL ABUSE--  STATES  Quit 02/2010  . Drug Use: No  . Sexual Activity: Not on file   Other Topics Concern  . Not on file   Social History Narrative   No regular exercise   Daily caffeine          ROS: no fevers or chills, productive cough, hemoptysis, dysphasia, odynophagia, melena, hematochezia, dysuria, hematuria, rash, seizure activity, orthopnea, PND, pedal edema, claudication. Remaining systems are negative.  Physical Exam: Well-developed well-nourished in no acute distress.  Skin is warm and dry.  HEENT is normal.  Neck is supple.  Chest is clear to auscultation with normal expansion.  Cardiovascular exam is regular rate and rhythm.  Abdominal exam nontender or distended. No masses palpated. Extremities show no edema. neuro grossly intact  ECG     This encounter was created in error - please disregard.

## 2015-04-23 ENCOUNTER — Encounter: Payer: BLUE CROSS/BLUE SHIELD | Admitting: Cardiology

## 2015-08-09 ENCOUNTER — Other Ambulatory Visit: Payer: Self-pay | Admitting: Cardiology

## 2015-08-09 ENCOUNTER — Telehealth: Payer: Self-pay | Admitting: Cardiology

## 2015-08-09 MED ORDER — CARVEDILOL 25 MG PO TABS: 25.0000 mg | ORAL_TABLET | Freq: Two times a day (BID) | ORAL | Status: DC

## 2015-08-09 MED ORDER — LISINOPRIL 5 MG PO TABS: ORAL_TABLET | ORAL | Status: DC

## 2015-08-09 NOTE — Telephone Encounter (Signed)
Refill sent to the pharmacy electronically.  

## 2015-08-09 NOTE — Telephone Encounter (Signed)
°*  STAT* If patient is at the pharmacy, call can be transferred to refill team.   1. Which medications need to be refilled? (please list name of each medication and dose if known) Lisinopril,  Carvediolol  2. Which pharmacy/location (including street and city if local pharmacy) is medication to be sent to?[facility] on [address] in [place]   3. Do they need a 30 day or 90 day supply? [place]

## 2015-11-06 ENCOUNTER — Telehealth: Payer: Self-pay | Admitting: Cardiology

## 2015-11-06 NOTE — Telephone Encounter (Signed)
[name] is calling because Mike Orozco is in the hospital , because he had a reaction tot he Lisinopril and the Doctor at [facility] is wanting to speak with Dr. Stanford Breed about the medication. Please call   Thanks

## 2015-11-06 NOTE — Progress Notes (Signed)
HPI: FU nonischemic cardiomyopathy with EF initially at 15-20%. Cardiac catheterization in 03/2010 revealed normal coronary arteries. Last echocardiogram in June 2015 showed improved LV function with an ejection fraction of 50-55%, mild LVH, grade 1 diastolic dysfunction and mildly dilated aortic root. Since last seen,   Current Outpatient Prescriptions  Medication Sig Dispense Refill  . albuterol (PROVENTIL HFA;VENTOLIN HFA) 108 (90 BASE) MCG/ACT inhaler Inhale 2 puffs into the lungs every 6 (six) hours as needed for wheezing or shortness of breath. 1 Inhaler 2  . carvedilol (COREG) 25 MG tablet Take 1 tablet (25 mg total) by mouth 2 (two) times daily. 60 tablet 4  . fenofibrate 160 MG tablet Take 1 tablet (160 mg total) by mouth at bedtime. 30 tablet 1  . ferrous sulfate 325 (65 FE) MG tablet Take 325 mg by mouth daily with breakfast.    . insulin detemir (LEVEMIR) 100 UNIT/ML injection Inject 0.32 mLs (32 Units total) into the skin daily. (Patient taking differently: Inject 8 Units into the skin daily. ) 15 mL 0  . Lancets (ONETOUCH ULTRASOFT) lancets Use 2x a day 200 each 11  . lisinopril (PRINIVIL,ZESTRIL) 5 MG tablet TAKE 1 TABLET (5 MG TOTAL) BY MOUTH DAILY. 30 tablet 4  . Multiple Vitamin (MULTIVITAMIN) capsule Take 1 capsule by mouth daily.    . ONE TOUCH ULTRA TEST test strip TEST BLOOD SUGAR BEFORE AND AFTER BREAKFAST,LUND AND DINNER AND AT BEDTIME 700 each 3  . predniSONE (DELTASONE) 10 MG tablet Take 3 tablets (30 mg total) by mouth daily. 15 tablet 0  . promethazine-codeine (PHENERGAN WITH CODEINE) 6.25-10 MG/5ML syrup Take 5 mLs by mouth at bedtime as needed for cough. 120 mL 0  . [DISCONTINUED] allopurinol (ZYLOPRIM) 300 MG tablet TAKE 1 TABLET (300 MG TOTAL) BY MOUTH DAILY. 90 tablet 1   No current facility-administered medications for this visit.     Past Medical History  Diagnosis Date  . Heart failure, chronic systolic   . Non-ischemic cardiomyopathy ([place])  SECONDARY TO HX UNCONTROLLED HTN AND ETOH ABUSE----  LAST ECHO EF 50%  09-22-2011    a) Echo 03/25/2010: EF 20-25%; mild AI; Mod MR; Mild LAE  b)cardiac cath 03/26/2010: normal cors; severe pulmonary HTN;PCWP 41; EF 10-15%;  c. echo 4/12:  EF 45-50%, mild LVH, grade 1 diast dysfxn, mild AI, mild LAE  . History of ETOH abuse   . Gout, unspecified     stable  per pt  . Pure hyperglyceridemia   . Anemia   . Personal history of colonic polyps-adenoma 02/17/2012    01/2012 - diminutive adenoma Repeat colon about 01/2017    . Heart block AV first degree   . Hypertension     CARDIOLOGIST-  DR Stanford Breed--- LOV IN [facility] 09-08-2011  . Diabetes mellitus type 2, insulin dependent PER pt and LAST PCP NOTE CBG'S IN 100'    RECENTLY UNCONTROLLED AIC 07-07-2012  9.4 AND CBG'S 400'S  . GERD (gastroesophageal reflux disease)   . Arthritis   . CHF (congestive heart failure)   . Chronic renal insufficiency baseline CRE  1.5--1.8    NEPHROLOGIST--  DR WEBB ([facility])  . Nephrolithiasis     RIGHT  . Polycystic kidney disease     BILATERAL RENAL CYST    Past Surgical History  Procedure Laterality Date  . Lumbar fusion  08-10-2003    L5  --  S1  . Removal cyst left lung  1994   AT [facility]  benign  . Knee arthroscopy w/ meniscectomy Left 02-06-2011    AND CHONDROPLASTY  . Transthoracic echocardiogram  09-22-2011  DR CRENSHAW    EF 50%/  MILD LVH/ GRADE I DIASTOLIC DYSFUNCTION/ MILD TR  . Rhinoplasty  1970'S    nose fx  . Cystoscopy with retrograde pyelogram, ureteroscopy and stent placement Right 08/18/2012    Procedure: CYSTOSCOPY WITH RETROGRADE PYELOGRAM, URETEROSCOPY AND STENT PLACEMENT;  Surgeon: Molli Hazard, MD;  Location: [facility];  Service: Urology;  Laterality: Right;  . Colonoscopy    . Nephrolithotomy Right 09/15/2012    Procedure: NEPHROLITHOTOMY PERCUTANEOUS;  Surgeon: Molli Hazard, MD;  Location: [facility];  Service: Urology;  Laterality: Right;  RIGHT  PERCUTANEOUS NEPHROSTOLITHOTOMY   . Kidney stone surgery      X 2  . Cardiac catheterization  03-26-2010  DR Martinique    SEVERE LV DYSFUNCTION WITH MARKEDLY ELEVATED FILLING PRESSURE/ SEVERE PULMONARY HYPERTENSION/ NORMAL CORONARY ANATOMY    Social History   Social History  . Marital Status: Married    Spouse Name: N/A  . Number of Children: 1  . Years of Education: N/A   Occupational History  . [employer]   Social History Main Topics  . Smoking status: Current Every Day Smoker -- 0.00 packs/day for 32 years    Types: Cigars    Last Attempt to Quit: 02/27/2007  . Smokeless tobacco: Never Used  . Alcohol Use: No     Comment: HX ALCOHOL ABUSE--  STATES  Quit 02/2010  . Drug Use: No  . Sexual Activity: Not on file   Other Topics Concern  . Not on file   Social History Narrative   No regular exercise   Daily caffeine          Family History  Problem Relation Age of Onset  . Diabetes Mother   . Hypertension Mother   . Diabetes Father   . Hypertension Father   . Colon cancer Neg Hx     ROS: no fevers or chills, productive cough, hemoptysis, dysphasia, odynophagia, melena, hematochezia, dysuria, hematuria, rash, seizure activity, orthopnea, PND, pedal edema, claudication. Remaining systems are negative.  Physical Exam: Well-developed well-nourished in no acute distress.  Skin is warm and dry.  HEENT is normal.  Neck is supple.  Chest is clear to auscultation with normal expansion.  Cardiovascular exam is regular rate and rhythm.  Abdominal exam nontender or distended. No masses palpated. Extremities show no edema. neuro grossly intact  ECG     This encounter was created in error - please disregard.

## 2015-11-06 NOTE — Telephone Encounter (Signed)
Returned call to patient's wife Mike Orozco no answer.[place].

## 2015-11-07 NOTE — Telephone Encounter (Signed)
Spoke with pt, he was told in the ER he was having a reaction to lisinopril. He has swollen lips and eyes. He is wondering what to take in the place of the lisinopril. His bp today was 133/70. He is going to monitor his bp and call me if elevated off the lisinopril. He was told to continue the carvedilol and all other medications.

## 2015-11-07 NOTE — Telephone Encounter (Signed)
Follow up   Pt calling to speak to rn about last message about medication

## 2015-11-11 ENCOUNTER — Encounter (HOSPITAL_COMMUNITY): Payer: Self-pay

## 2015-11-11 ENCOUNTER — Emergency Department (HOSPITAL_COMMUNITY)
Admission: EM | Admit: 2015-11-11 | Discharge: 2015-11-11 | Disposition: A | Discharge status: Home / Self Care | Payer: BLUE CROSS/BLUE SHIELD | Attending: Emergency Medicine | Admitting: Emergency Medicine

## 2015-11-11 DIAGNOSIS — Z794 Long term (current) use of insulin: Secondary | ICD-10-CM | POA: Insufficient documentation

## 2015-11-11 DIAGNOSIS — Z79899 Other long term (current) drug therapy: Secondary | ICD-10-CM | POA: Insufficient documentation

## 2015-11-11 DIAGNOSIS — F17210 Nicotine dependence, cigarettes, uncomplicated: Secondary | ICD-10-CM | POA: Insufficient documentation

## 2015-11-11 DIAGNOSIS — E119 Type 2 diabetes mellitus without complications: Secondary | ICD-10-CM | POA: Insufficient documentation

## 2015-11-11 DIAGNOSIS — I110 Hypertensive heart disease with heart failure: Secondary | ICD-10-CM | POA: Insufficient documentation

## 2015-11-11 DIAGNOSIS — I509 Heart failure, unspecified: Secondary | ICD-10-CM | POA: Insufficient documentation

## 2015-11-11 DIAGNOSIS — L509 Urticaria, unspecified: Secondary | ICD-10-CM | POA: Insufficient documentation

## 2015-11-11 MED ORDER — PREDNISONE 20 MG PO TABS
40.0000 mg | ORAL_TABLET | Freq: Once | ORAL | Status: AC
  Administered 2015-11-11: 40 mg via ORAL
  Filled 2015-11-11: qty 2

## 2015-11-11 MED ORDER — CETIRIZINE HCL 10 MG PO CHEW: 10.0000 mg | CHEWABLE_TABLET | Freq: Every day | ORAL | Status: DC

## 2015-11-11 MED ORDER — HYDROXYZINE HCL 25 MG PO TABS
25.0000 mg | ORAL_TABLET | Freq: Once | ORAL | Status: AC
  Administered 2015-11-11: 25 mg via ORAL
  Filled 2015-11-11: qty 1

## 2015-11-11 MED ORDER — METHYLPREDNISOLONE 4 MG PO TBPK: ORAL_TABLET | ORAL | Status: DC

## 2015-11-11 NOTE — ED Notes (Signed)
Patient here with intermittent hives x 1 week, thinks he was bitten by something. Went to [facility] last week and they told him they think related to lisinopril. No distress

## 2015-11-11 NOTE — ED Provider Notes (Signed)
CSN: [id number]     Arrival date & time 11/11/15  1205 History   First MD Initiated Contact with Patient 11/11/15 1302     Chief Complaint  Patient presents with  . Urticaria     Patient is a 55 y.o. male presenting with urticaria. The history is provided by the patient. No language interpreter was used.  Urticaria   Mike Orozco is a 55 y.o. male who presents to the Emergency Department complaining of itching.  Reports 1 week of intermittent urticaria. Symptoms started 1 week ago and he was seen on [facility] emergency department where he was treated with steroids. His symptoms resolved for a day and then he had recurrent symptoms 5 days ago. He was again treated with steroids and his course completed yesterday. Today he developed recurrent diffuse urticaria. He has itching and hives to his back, all 4 extremities, trunk. He denies a chest pain, shortness of breath, fevers, vomiting. No new medications. No new detergents or soaps. No insect bites.  Past Medical History  Diagnosis Date  . Heart failure, chronic systolic ([place])   . Non-ischemic cardiomyopathy ([place]) SECONDARY TO HX UNCONTROLLED HTN AND ETOH ABUSE----  LAST ECHO EF 50%  09-22-2011    a) Echo 03/25/2010: EF 20-25%; mild AI; Mod MR; Mild LAE  b)cardiac cath 03/26/2010: normal cors; severe pulmonary HTN;PCWP 41; EF 10-15%;  c. echo 4/12:  EF 45-50%, mild LVH, grade 1 diast dysfxn, mild AI, mild LAE  . History of ETOH abuse   . Gout, unspecified     stable  per pt  . Pure hyperglyceridemia   . Anemia   . Personal history of colonic polyps-adenoma 02/17/2012    01/2012 - diminutive adenoma Repeat colon about 01/2017    . Heart block AV first degree   . Hypertension     CARDIOLOGIST-  DR Stanford Breed--- LOV IN [facility] 09-08-2011  . Diabetes mellitus type 2, insulin dependent ([facility]) PER pt and LAST PCP NOTE CBG'S IN 100'    RECENTLY UNCONTROLLED AIC 07-07-2012  9.4 AND CBG'S 400'S  . GERD (gastroesophageal reflux disease)   . Arthritis   . CHF  (congestive heart failure) ([place])   . Chronic renal insufficiency baseline CRE  1.5--1.8    NEPHROLOGIST--  DR WEBB ([facility])  . Nephrolithiasis     RIGHT  . Polycystic kidney disease     BILATERAL RENAL CYST   Past Surgical History  Procedure Laterality Date  . Lumbar fusion  08-10-2003    L5  --  S1  . Removal cyst left lung  1994   AT [facility]    benign  . Knee arthroscopy w/ meniscectomy Left 02-06-2011    AND CHONDROPLASTY  . Transthoracic echocardiogram  09-22-2011  DR CRENSHAW    EF 50%/  MILD LVH/ GRADE I DIASTOLIC DYSFUNCTION/ MILD TR  . Rhinoplasty  1970'S    nose fx  . Cystoscopy with retrograde pyelogram, ureteroscopy and stent placement Right 08/18/2012    Procedure: CYSTOSCOPY WITH RETROGRADE PYELOGRAM, URETEROSCOPY AND STENT PLACEMENT;  Surgeon: Molli Hazard, MD;  Location: [facility];  Service: Urology;  Laterality: Right;  . Colonoscopy    . Nephrolithotomy Right 09/15/2012    Procedure: NEPHROLITHOTOMY PERCUTANEOUS;  Surgeon: Molli Hazard, MD;  Location: [facility];  Service: Urology;  Laterality: Right;  RIGHT PERCUTANEOUS NEPHROSTOLITHOTOMY   . Kidney stone surgery      X 2  . Cardiac catheterization  03-26-2010  DR Martinique    SEVERE  LV DYSFUNCTION WITH MARKEDLY ELEVATED FILLING PRESSURE/ SEVERE PULMONARY HYPERTENSION/ NORMAL CORONARY ANATOMY   Family History  Problem Relation Age of Onset  . Diabetes Mother   . Hypertension Mother   . Diabetes Father   . Hypertension Father   . Colon cancer Neg Hx    Social History  Substance Use Topics  . Smoking status: Current Every Day Smoker -- 0.00 packs/day for 32 years    Types: Cigars    Last Attempt to Quit: 02/27/2007  . Smokeless tobacco: Never Used  . Alcohol Use: No     Comment: HX ALCOHOL ABUSE--  STATES  Quit 02/2010    Review of Systems  All other systems reviewed and are negative.     Allergies  Metformin and related  Home Medications   Prior to Admission  medications   Medication Sig Start Date End Date Taking? Authorizing Provider  albuterol (PROVENTIL HFA;VENTOLIN HFA) 108 (90 BASE) MCG/ACT inhaler Inhale 2 puffs into the lungs every 6 (six) hours as needed for wheezing or shortness of breath. 07/30/14   Gregor Hams, MD  carvedilol (COREG) 25 MG tablet Take 1 tablet (25 mg total) by mouth 2 (two) times daily. 08/09/15   Lelon Perla, MD  cetirizine (ZYRTEC) 10 MG chewable tablet Chew 1 tablet (10 mg total) by mouth daily. 11/11/15   Quintella Reichert, MD  fenofibrate 160 MG tablet Take 1 tablet (160 mg total) by mouth at bedtime. 03/25/13   Costin Karlyne Greenspan, MD  ferrous sulfate 325 (65 FE) MG tablet Take 325 mg by mouth daily with breakfast.    Historical Provider, MD  insulin detemir (LEVEMIR) 100 UNIT/ML injection Inject 0.32 mLs (32 Units total) into the skin daily. Patient taking differently: Inject 8 Units into the skin daily.  04/04/14   Philemon Kingdom, MD  Lancets [facility] ULTRASOFT) lancets Use 2x a day 09/01/13   Philemon Kingdom, MD  lisinopril (PRINIVIL,ZESTRIL) 5 MG tablet TAKE 1 TABLET (5 MG TOTAL) BY MOUTH DAILY. 08/09/15   Lelon Perla, MD  methylPREDNISolone (MEDROL DOSEPAK) 4 MG TBPK tablet Take according to label instructions 11/11/15   Quintella Reichert, MD  Multiple Vitamin (MULTIVITAMIN) capsule Take 1 capsule by mouth daily.    Historical Provider, MD  ONE TOUCH ULTRA TEST test strip TEST BLOOD SUGAR BEFORE AND AFTER BREAKFAST,LUND AND DINNER AND AT BEDTIME    Philemon Kingdom, MD  predniSONE (DELTASONE) 10 MG tablet Take 3 tablets (30 mg total) by mouth daily. 07/30/14   Gregor Hams, MD  promethazine-codeine (PHENERGAN WITH CODEINE) 6.25-10 MG/5ML syrup Take 5 mLs by mouth at bedtime as needed for cough. 07/30/14   Gregor Hams, MD   BP 132/100 mmHg  Pulse 85  Temp(Src) 98.5 F (36.9 C) (Oral)  Resp 16  SpO2 100% Physical Exam  Constitutional: He is oriented to person, place, and time. He appears well-developed and  well-nourished.  HENT:  Head: Normocephalic and atraumatic.  Mouth/Throat: Oropharynx is clear and moist.  Cardiovascular: Normal rate and regular rhythm.   No murmur heard. Pulmonary/Chest: Effort normal and breath sounds normal. No respiratory distress.  Abdominal: Soft. There is no tenderness. There is no rebound and no guarding.  Musculoskeletal: He exhibits no edema or tenderness.  Neurological: He is alert and oriented to person, place, and time.  Skin: Skin is warm and dry.  Diffuse urticaria of trunk, all four extremities  Psychiatric: He has a normal mood and affect. His behavior is normal.  Nursing note and  vitals reviewed.   ED Course  Procedures (including critical care time) Labs Review Labs Reviewed - No data to display  Imaging Review No results found. I have personally reviewed and evaluated these images and lab results as part of my medical decision-making.   EKG Interpretation None      MDM   Final diagnoses:  Urticaria    Patient here for evaluation of urticaria. He does have an urticarial rash to the extremities and trunk. He has no respiratory distress.  There is no evidence of anaphylaxis based on history or examination. He does not have any potential triggers.   Discussed outpatient follow up for urticaria of unclear etiology. Will treat with another course of steroids, Zyrtec, Benadryl. Discussed PCP follow-up, home care, return precautions.    Quintella Reichert, MD 11/11/15 [phone]

## 2015-11-11 NOTE — Discharge Instructions (Signed)
Hives Hives are itchy, red, swollen areas of the skin. They can vary in size and location on your body. Hives can come and go for hours or several days (acute hives) or for several weeks (chronic hives). Hives do not spread from person to person (noncontagious). They may get worse with scratching, exercise, and emotional stress. CAUSES   Allergic reaction to food, additives, or drugs.  Infections, including the common cold.  Illness, such as vasculitis, lupus, or thyroid disease.  Exposure to sunlight, heat, or cold.  Exercise.  Stress.  Contact with chemicals. SYMPTOMS   Red or white swollen patches on the skin. The patches may change size, shape, and location quickly and repeatedly.  Itching.  Swelling of the hands, feet, and face. This may occur if hives develop deeper in the skin. DIAGNOSIS  Your caregiver can usually tell what is wrong by performing a physical exam. Skin or blood tests may also be done to determine the cause of your hives. In some cases, the cause cannot be determined. TREATMENT  Mild cases usually get better with medicines such as antihistamines. Severe cases may require an emergency epinephrine injection. If the cause of your hives is known, treatment includes avoiding that trigger.  HOME CARE INSTRUCTIONS   Avoid causes that trigger your hives.  Take antihistamines as directed by your caregiver to reduce the severity of your hives. Non-sedating or low-sedating antihistamines are usually recommended. Do not drive while taking an antihistamine.  Take any other medicines prescribed for itching as directed by your caregiver.  Wear loose-fitting clothing.  Keep all follow-up appointments as directed by your caregiver. SEEK MEDICAL CARE IF:   You have persistent or severe itching that is not relieved with medicine.  You have painful or swollen joints. SEEK IMMEDIATE MEDICAL CARE IF:   You have a fever.  Your tongue or lips are swollen.  You have  trouble breathing or swallowing.  You feel tightness in the throat or chest.  You have abdominal pain. These problems may be the first sign of a life-threatening allergic reaction. Call your local emergency services (911 in [place] MAKE SURE YOU:   Understand these instructions.  Will watch your condition.  Will get help right away if you are not doing well or get worse.   This information is not intended to replace advice given to you by your health care provider. Make sure you discuss any questions you have with your health care provider.   Document Released: 05/05/2005 Document Revised: 05/10/2013 Document Reviewed: 07/29/2011 Elsevier Interactive Patient Education 2016 [employer]  

## 2015-11-12 ENCOUNTER — Other Ambulatory Visit (INDEPENDENT_AMBULATORY_CARE_PROVIDER_SITE_OTHER): Payer: PRIVATE HEALTH INSURANCE

## 2015-11-12 ENCOUNTER — Ambulatory Visit (INDEPENDENT_AMBULATORY_CARE_PROVIDER_SITE_OTHER): Payer: PRIVATE HEALTH INSURANCE | Admitting: Internal Medicine

## 2015-11-12 ENCOUNTER — Encounter: Payer: Self-pay | Admitting: Internal Medicine

## 2015-11-12 VITALS — BP 150/100 | HR 89 | Temp 98.4°F | Resp 16 | Ht 68.0 in | Wt 196.2 lb

## 2015-11-12 DIAGNOSIS — E785 Hyperlipidemia, unspecified: Secondary | ICD-10-CM

## 2015-11-12 DIAGNOSIS — M1000 Idiopathic gout, unspecified site: Secondary | ICD-10-CM

## 2015-11-12 DIAGNOSIS — I10 Essential (primary) hypertension: Secondary | ICD-10-CM | POA: Diagnosis not present

## 2015-11-12 DIAGNOSIS — Z794 Long term (current) use of insulin: Secondary | ICD-10-CM | POA: Diagnosis not present

## 2015-11-12 DIAGNOSIS — E781 Pure hyperglyceridemia: Secondary | ICD-10-CM

## 2015-11-12 DIAGNOSIS — N181 Chronic kidney disease, stage 1: Secondary | ICD-10-CM | POA: Diagnosis not present

## 2015-11-12 DIAGNOSIS — E118 Type 2 diabetes mellitus with unspecified complications: Secondary | ICD-10-CM | POA: Diagnosis not present

## 2015-11-12 DIAGNOSIS — R7989 Other specified abnormal findings of blood chemistry: Secondary | ICD-10-CM

## 2015-11-12 DIAGNOSIS — Z0000 Encounter for general adult medical examination without abnormal findings: Secondary | ICD-10-CM | POA: Diagnosis not present

## 2015-11-12 DIAGNOSIS — D638 Anemia in other chronic diseases classified elsewhere: Secondary | ICD-10-CM

## 2015-11-12 DIAGNOSIS — R946 Abnormal results of thyroid function studies: Secondary | ICD-10-CM

## 2015-11-12 LAB — POCT GLYCOSYLATED HEMOGLOBIN (HGB A1C): Hemoglobin A1C: 6.4

## 2015-11-12 LAB — CBC WITH DIFFERENTIAL/PLATELET
BASOS PCT: 0.5 % (ref 0.0–3.0)
Basophils Absolute: 0 10*3/uL (ref 0.0–0.1)
EOS PCT: 1.3 % (ref 0.0–5.0)
Eosinophils Absolute: 0.1 10*3/uL (ref 0.0–0.7)
HCT: 37.8 % — ABNORMAL LOW (ref 39.0–52.0)
HEMOGLOBIN: 12.4 g/dL — AB (ref 13.0–17.0)
LYMPHS PCT: 31.6 % (ref 12.0–46.0)
Lymphs Abs: 3.2 10*3/uL (ref 0.7–4.0)
MCHC: 32.9 g/dL (ref 30.0–36.0)
MCV: 93.8 fl (ref 78.0–100.0)
MONOS PCT: 0.7 % — AB (ref 3.0–12.0)
Monocytes Absolute: 0.1 10*3/uL (ref 0.1–1.0)
Neutro Abs: 6.6 10*3/uL (ref 1.4–7.7)
Neutrophils Relative %: 65.9 % (ref 43.0–77.0)
Platelets: 325 10*3/uL (ref 150.0–400.0)
RBC: 4.03 Mil/uL — AB (ref 4.22–5.81)
RDW: 12.9 % (ref 11.5–15.5)
WBC: 10 10*3/uL (ref 4.0–10.5)

## 2015-11-12 LAB — URINALYSIS, ROUTINE W REFLEX MICROSCOPIC
BILIRUBIN URINE: NEGATIVE
HGB URINE DIPSTICK: NEGATIVE
KETONES UR: NEGATIVE
LEUKOCYTES UA: NEGATIVE
NITRITE: NEGATIVE
Specific Gravity, Urine: 1.025 (ref 1.000–1.030)
TOTAL PROTEIN, URINE-UPE24: NEGATIVE
URINE GLUCOSE: 500 — AB
Urobilinogen, UA: 0.2 (ref 0.0–1.0)
pH: 6 (ref 5.0–8.0)

## 2015-11-12 LAB — URIC ACID: Uric Acid, Serum: 7.9 mg/dL — ABNORMAL HIGH (ref 4.0–7.8)

## 2015-11-12 LAB — COMPREHENSIVE METABOLIC PANEL
ALBUMIN: 4.2 g/dL (ref 3.5–5.2)
ALK PHOS: 77 U/L (ref 39–117)
ALT: 39 U/L (ref 0–53)
AST: 22 U/L (ref 0–37)
BUN: 23 mg/dL (ref 6–23)
CALCIUM: 9.7 mg/dL (ref 8.4–10.5)
CHLORIDE: 104 meq/L (ref 96–112)
CO2: 30 mEq/L (ref 19–32)
Creatinine, Ser: 1.32 mg/dL (ref 0.40–1.50)
GFR: 72.4 mL/min (ref >60.00)
Glucose, Bld: 175 mg/dL — ABNORMAL HIGH (ref 70–99)
POTASSIUM: 3.6 meq/L (ref 3.5–5.1)
SODIUM: 140 meq/L (ref 135–145)
TOTAL PROTEIN: 6.8 g/dL (ref 6.0–8.3)
Total Bilirubin: 0.5 mg/dL (ref 0.2–1.2)

## 2015-11-12 LAB — HEMOGLOBIN A1C: Hemoglobin A1C: 6.4

## 2015-11-12 LAB — LIPID PANEL
Cholesterol: 228 mg/dL — ABNORMAL HIGH (ref 0–200)
HDL: 45.3 mg/dL (ref >39.00)
Total CHOL/HDL Ratio: 5

## 2015-11-12 LAB — LDL CHOLESTEROL, DIRECT: Direct LDL: 119 mg/dL

## 2015-11-12 LAB — PSA: PSA: 2.27 ng/mL (ref 0.10–4.00)

## 2015-11-12 LAB — MICROALBUMIN / CREATININE URINE RATIO
Creatinine,U: 197.9 mg/dL
Microalb Creat Ratio: 1.7 mg/g (ref 0.0–30.0)
Microalb, Ur: 3.3 mg/dL — ABNORMAL HIGH (ref 0.0–1.9)

## 2015-11-12 LAB — TSH: TSH: 7.93 u[IU]/mL — AB (ref 0.35–4.50)

## 2015-11-12 MED ORDER — AZILSARTAN-CHLORTHALIDONE 40-25 MG PO TABS: 1.0000 | ORAL_TABLET | Freq: Every day | ORAL | Status: DC

## 2015-11-12 MED ORDER — ASPIRIN EC 81 MG PO TBEC: 81.0000 mg | DELAYED_RELEASE_TABLET | Freq: Every day | ORAL | Status: DC

## 2015-11-12 NOTE — Progress Notes (Signed)
Subjective:  Patient ID: Mike Orozco, male    DOB: 10/10/1961  Age: 55 y.o. MRN: [id number]  CC: Annual Exam; Hypertension; Hyperlipidemia; and Diabetes   HPI Onur Deja presents for a CPX.  He has had a few episodes of urticaria over the last few weeks. He tells me that he has had a couple of visits to the emergency department. He has been treated with antihistamines and steroids. There has been some improvement. One day prior to this visit he had an episode of itchy wheals on his chest, arms, and legs that was associated with the ingestion of his ACE inhibitor. He has had no more hives today. He has had no episodes of headache, blurred vision, chest pain, shortness of breath, wheezing, swelling around his mouth lips or tongue, edema, or palpitations.  He has not been monitoring his diabetes and has not been using his insulin. He denies visual disturbance, polyuria, polydipsia, or polyphagia.   Outpatient Prescriptions Prior to Visit  Medication Sig Dispense Refill  . albuterol (PROVENTIL HFA;VENTOLIN HFA) 108 (90 BASE) MCG/ACT inhaler Inhale 2 puffs into the lungs every 6 (six) hours as needed for wheezing or shortness of breath. 1 Inhaler 2  . carvedilol (COREG) 25 MG tablet Take 1 tablet (25 mg total) by mouth 2 (two) times daily. 60 tablet 4  . cetirizine (ZYRTEC) 10 MG chewable tablet Chew 1 tablet (10 mg total) by mouth daily. 10 tablet 0  . ferrous sulfate 325 (65 FE) MG tablet Take 325 mg by mouth daily with breakfast.    . Lancets (ONETOUCH ULTRASOFT) lancets Use 2x a day 200 each 11  . ONE TOUCH ULTRA TEST test strip TEST BLOOD SUGAR BEFORE AND AFTER BREAKFAST,LUND AND DINNER AND AT BEDTIME 700 each 3  . fenofibrate 160 MG tablet Take 1 tablet (160 mg total) by mouth at bedtime. 30 tablet 1  . lisinopril (PRINIVIL,ZESTRIL) 5 MG tablet TAKE 1 TABLET (5 MG TOTAL) BY MOUTH DAILY. 30 tablet 4  . methylPREDNISolone (MEDROL DOSEPAK) 4 MG TBPK tablet Take according to label  instructions 21 tablet 0  . Multiple Vitamin (MULTIVITAMIN) capsule Take 1 capsule by mouth daily.    . insulin detemir (LEVEMIR) 100 UNIT/ML injection Inject 0.32 mLs (32 Units total) into the skin daily. (Patient taking differently: Inject 8 Units into the skin daily. ) 15 mL 0  . predniSONE (DELTASONE) 10 MG tablet Take 3 tablets (30 mg total) by mouth daily. 15 tablet 0  . promethazine-codeine (PHENERGAN WITH CODEINE) 6.25-10 MG/5ML syrup Take 5 mLs by mouth at bedtime as needed for cough. 120 mL 0   No facility-administered medications prior to visit.    ROS Review of Systems  Constitutional: Negative.  Negative for chills, diaphoresis, fatigue and unexpected weight change.  HENT: Negative.  Negative for congestion, facial swelling, postnasal drip, rhinorrhea, sinus pressure, sneezing, sore throat, tinnitus and trouble swallowing.   Eyes: Negative.  Negative for visual disturbance.  Respiratory: Negative.  Negative for cough, choking, chest tightness, shortness of breath and stridor.   Cardiovascular: Negative.  Negative for chest pain, palpitations and leg swelling.  Gastrointestinal: Negative.  Negative for nausea, vomiting, abdominal pain, diarrhea, constipation and blood in stool.  Endocrine: Negative.  Negative for polydipsia, polyphagia and polyuria.  Genitourinary: Negative.  Negative for dysuria, frequency, hematuria, flank pain, penile swelling, scrotal swelling, difficulty urinating and testicular pain.  Musculoskeletal: Negative.  Negative for myalgias, back pain, joint swelling, arthralgias and neck pain.  Skin: Negative.  Negative for  color change, pallor and rash.  Allergic/Immunologic: Negative.   Neurological: Negative.  Negative for dizziness and light-headedness.  Hematological: Negative.  Negative for adenopathy. Does not bruise/bleed easily.  Psychiatric/Behavioral: Negative.     Objective:  BP 150/100 mmHg  Pulse 89  Temp(Src) 98.4 F (36.9 C) (Oral)  Resp 16   Ht 5\' 8"  (1.727 m)  Wt 196 lb 4 oz (89.018 kg)  BMI 29.85 kg/m2  SpO2 97%  BP Readings from Last 3 Encounters:  11/12/15 150/100  11/11/15 132/100  07/30/14 112/76    Wt Readings from Last 3 Encounters:  11/12/15 196 lb 4 oz (89.018 kg)  04/29/14 204 lb 4 oz (92.647 kg)  12/23/13 200 lb (90.719 kg)    Physical Exam  Constitutional: He is oriented to person, place, and time. He appears well-developed and well-nourished. No distress.  HENT:  Head: Normocephalic and atraumatic.  Mouth/Throat: Oropharynx is clear and moist. No oropharyngeal exudate.  Eyes: Conjunctivae are normal. Right eye exhibits no discharge. Left eye exhibits no discharge. No scleral icterus.  Neck: Normal range of motion. Neck supple. No JVD present. No tracheal deviation present. No thyromegaly present.  Cardiovascular: Normal rate, regular rhythm, normal heart sounds and intact distal pulses.  Exam reveals no gallop and no friction rub.   No murmur heard. EKG ----  Sinus  Rhythm  Voltage criteria for LVH  (R(I)+S(III) exceeds 2.50 mV)  -Nonspecific QRS widening.   ABNORMAL   Pulmonary/Chest: Effort normal and breath sounds normal. No stridor. No respiratory distress. He has no wheezes. He has no rales. He exhibits no tenderness.  Abdominal: Soft. Bowel sounds are normal. He exhibits no distension and no mass. There is no tenderness. There is no rebound and no guarding. Hernia confirmed negative in the right inguinal area and confirmed negative in the left inguinal area.  Genitourinary: Prostate normal, testes normal and penis normal. Rectal exam shows anal tone abnormal. Rectal exam shows no external hemorrhoid, no internal hemorrhoid, no fissure, no mass and no tenderness. Guaiac negative stool. Prostate is not enlarged and not tender. Right testis shows no mass, no swelling and no tenderness. Right testis is descended. Left testis shows no mass, no swelling and no tenderness. Left testis is descended.  Uncircumcised. No phimosis, paraphimosis, hypospadias, penile erythema or penile tenderness. No discharge found.  Musculoskeletal: Normal range of motion. He exhibits no edema or tenderness.  Lymphadenopathy:    He has no cervical adenopathy.       Right: No inguinal adenopathy present.       Left: No inguinal adenopathy present.  Neurological: He is oriented to person, place, and time.  Skin: Skin is warm and dry. No rash noted. He is not diaphoretic. No erythema. No pallor.  Psychiatric: He has a normal mood and affect. His behavior is normal. Judgment and thought content normal.  Vitals reviewed.   Lab Results  Component Value Date   WBC 10.0 11/12/2015   HGB 12.4* 11/12/2015   HCT 37.8* 11/12/2015   PLT 325.0 11/12/2015   GLUCOSE 175* 11/12/2015   CHOL 228* 11/12/2015   TRIG * 11/12/2015    607.0 Triglyceride is over 400; calculations on Lipids are invalid.   HDL 45.30 11/12/2015   LDLDIRECT 119.0 11/12/2015   LDLCALC UNABLE TO CALCULATE IF TRIGLYCERIDE OVER 400 mg/dL 03/23/2013   ALT 39 11/12/2015   AST 22 11/12/2015   NA 140 11/12/2015   K 3.6 11/12/2015   CL 104 11/12/2015   CREATININE 1.32 11/12/2015  BUN 23 11/12/2015   CO2 30 11/12/2015   TSH 7.93* 11/12/2015   PSA 2.27 11/12/2015   INR 1.01 09/15/2012   HGBA1C 6.4 11/12/2015   MICROALBUR 3.3* 11/12/2015    No results found.  Assessment & Plan:   Enrigue was seen today for annual exam, hypertension, hyperlipidemia and diabetes.  Diagnoses and all orders for this visit:  Chronic renal insufficiency, stage 1- his renal function has improved, he agrees to avoid nephrotoxic agents, I will work towards better blood pressure control.  Type 2 diabetes mellitus with complication, with long-term current use of insulin ([place])- his A1c is down to 6.4%, he does not tolerate metformin so will not add anything for blood sugar control at this time. -     Microalbumin / creatinine urine ratio; Future -     Ambulatory  referral to Ophthalmology -     Azilsartan-Chlorthalidone (EDARBYCLOR) 40-25 MG TABS; Take 1 tablet by mouth daily. -     POCT glycosylated hemoglobin (Hb A1C) -     aspirin EC 81 MG tablet; Take 1 tablet (81 mg total) by mouth daily. -     atorvastatin (LIPITOR) 40 MG tablet; Take 1 tablet (40 mg total) by mouth daily.  Idiopathic gout, unspecified chronicity, unspecified site- his uric acid level is elevated, will consider starting an oral agent to treat this during follow-up visits. -     Uric acid; Future  Hypertriglyceridemia- his triglycerides are above 600 so I have asked him to start a fish oil supplement to treat this. -     Icosapent Ethyl (VASCEPA) 1 g CAPS; Take 2 capsules by mouth 2 (two) times daily.  Routine general medical examination at a health care facility- exam completed, labs ordered and reviewed, vaccines were reviewed, his colonoscopy is up-to-date, patient education material was given. -     TSH; Future -     Urinalysis, Routine w reflex microscopic (not at [facility]); Future -     PSA; Future -     Comprehensive metabolic panel; Future -     CBC with Differential/Platelet; Future -     Lipid panel; Future  Essential hypertension, benign- his blood pressure is well-controlled and he has left ventricular hypertrophy on his EKG, I think the combination of an ARB plus a thiazide diuretic is a good choice to control his blood pressure. -     Azilsartan-Chlorthalidone (EDARBYCLOR) 40-25 MG TABS; Take 1 tablet by mouth daily. -     EKG 12-Lead  Anemia of chronic disease- this is stable  Hyperlipidemia with target LDL less than 100- he has not achieved his LDL goal so I've asked him to start taking atorvastatin -     atorvastatin (LIPITOR) 40 MG tablet; Take 1 tablet (40 mg total) by mouth daily.  TSH elevation- his TSH is slightly elevated but he has no symptoms of hypothyroidism, will continue to monitor this with no thyroid supplement.   I have discontinued Mr.  Wiesner multivitamin, fenofibrate, insulin detemir, predniSONE, promethazine-codeine, lisinopril, and methylPREDNISolone. I have also changed his aspirin EC. Additionally, I am having him start on Azilsartan-Chlorthalidone, Icosapent Ethyl, and atorvastatin. Lastly, I am having him maintain his ferrous sulfate, onetouch ultrasoft, ONE TOUCH ULTRA TEST, albuterol, carvedilol, and cetirizine.  Meds ordered this encounter  Medications  . DISCONTD: aspirin EC 81 MG tablet    Sig: Take 81 mg by mouth.  . Azilsartan-Chlorthalidone (EDARBYCLOR) 40-25 MG TABS    Sig: Take 1 tablet by mouth daily.  Dispense:  30 tablet    Refill:  5  . aspirin EC 81 MG tablet    Sig: Take 1 tablet (81 mg total) by mouth daily.    Dispense:  30 tablet    Refill:  11  . Icosapent Ethyl (VASCEPA) 1 g CAPS    Sig: Take 2 capsules by mouth 2 (two) times daily.    Dispense:  120 capsule    Refill:  11  . atorvastatin (LIPITOR) 40 MG tablet    Sig: Take 1 tablet (40 mg total) by mouth daily.    Dispense:  90 tablet    Refill:  3     Follow-up: Return in about 4 weeks (around 12/10/2015).  Scarlette Calico, MD

## 2015-11-12 NOTE — Patient Instructions (Signed)
Hypertension Hypertension, commonly called high blood pressure, is when the force of blood pumping through your arteries is too strong. Your arteries are the blood vessels that carry blood from your heart throughout your body. A blood pressure reading consists of a higher number over a lower number, such as 110/72. The higher number (systolic) is the pressure inside your arteries when your heart pumps. The lower number (diastolic) is the pressure inside your arteries when your heart relaxes. Ideally you want your blood pressure below 120/80. Hypertension forces your heart to work harder to pump blood. Your arteries may become narrow or stiff. Having untreated or uncontrolled hypertension can cause heart attack, stroke, kidney disease, and other problems. RISK FACTORS Some risk factors for high blood pressure are controllable. Others are not.  Risk factors you cannot control include:   Race. You may be at higher risk if you are African American.  Age. Risk increases with age.  Gender. Men are at higher risk than women before age 45 years. After age 65, women are at higher risk than men. Risk factors you can control include:  Not getting enough exercise or physical activity.  Being overweight.  Getting too much fat, sugar, calories, or salt in your diet.  Drinking too much alcohol. SIGNS AND SYMPTOMS Hypertension does not usually cause signs or symptoms. Extremely high blood pressure (hypertensive crisis) may cause headache, anxiety, shortness of breath, and nosebleed. DIAGNOSIS To check if you have hypertension, your health care provider will measure your blood pressure while you are seated, with your arm held at the level of your heart. It should be measured at least twice using the same arm. Certain conditions can cause a difference in blood pressure between your right and left arms. A blood pressure reading that is higher than normal on one occasion does not mean that you need treatment. If  it is not clear whether you have high blood pressure, you may be asked to return on a different day to have your blood pressure checked again. Or, you may be asked to monitor your blood pressure at home for 1 or more weeks. TREATMENT Treating high blood pressure includes making lifestyle changes and possibly taking medicine. Living a healthy lifestyle can help lower high blood pressure. You may need to change some of your habits. Lifestyle changes may include:  Following the DASH diet. This diet is high in fruits, vegetables, and whole grains. It is low in salt, red meat, and added sugars.  Keep your sodium intake below 2,300 mg per day.  Getting at least 30-45 minutes of aerobic exercise at least 4 times per week.  Losing weight if necessary.  Not smoking.  Limiting alcoholic beverages.  Learning ways to reduce stress. Your health care provider may prescribe medicine if lifestyle changes are not enough to get your blood pressure under control, and if one of the following is true:  You are 18-59 years of age and your systolic blood pressure is above 140.  You are 60 years of age or older, and your systolic blood pressure is above 150.  Your diastolic blood pressure is above 90.  You have diabetes, and your systolic blood pressure is over 140 or your diastolic blood pressure is over 90.  You have kidney disease and your blood pressure is above 140/90.  You have heart disease and your blood pressure is above 140/90. Your personal target blood pressure may vary depending on your medical conditions, your age, and other factors. HOME CARE INSTRUCTIONS    Have your blood pressure rechecked as directed by your health care provider.   Take medicines only as directed by your health care provider. Follow the directions carefully. Blood pressure medicines must be taken as prescribed. The medicine does not work as well when you skip doses. Skipping doses also puts you at risk for  problems.  Do not smoke.   Monitor your blood pressure at home as directed by your health care provider. SEEK MEDICAL CARE IF:   You think you are having a reaction to medicines taken.  You have recurrent headaches or feel dizzy.  You have swelling in your ankles.  You have trouble with your vision. SEEK IMMEDIATE MEDICAL CARE IF:  You develop a severe headache or confusion.  You have unusual weakness, numbness, or feel faint.  You have severe chest or abdominal pain.  You vomit repeatedly.  You have trouble breathing. MAKE SURE YOU:   Understand these instructions.  Will watch your condition.  Will get help right away if you are not doing well or get worse.   This information is not intended to replace advice given to you by your health care provider. Make sure you discuss any questions you have with your health care provider.   Document Released: 05/05/2005 Document Revised: 09/19/2014 Document Reviewed: 02/25/2013 Elsevier Interactive Patient Education 2016 Elsevier Inc.  

## 2015-11-12 NOTE — Progress Notes (Signed)
Pre visit review using our clinic review tool, if applicable. No additional management support is needed unless otherwise documented below in the visit note. 

## 2015-11-13 ENCOUNTER — Encounter: Payer: Self-pay | Admitting: Internal Medicine

## 2015-11-13 DIAGNOSIS — R7989 Other specified abnormal findings of blood chemistry: Secondary | ICD-10-CM | POA: Insufficient documentation

## 2015-11-13 DIAGNOSIS — Z0000 Encounter for general adult medical examination without abnormal findings: Secondary | ICD-10-CM | POA: Insufficient documentation

## 2015-11-13 DIAGNOSIS — E785 Hyperlipidemia, unspecified: Secondary | ICD-10-CM | POA: Insufficient documentation

## 2015-11-13 LAB — FECAL OCCULT BLOOD, GUAIAC: Fecal Occult Blood: NEGATIVE

## 2015-11-13 MED ORDER — ATORVASTATIN CALCIUM 40 MG PO TABS: 40.0000 mg | ORAL_TABLET | Freq: Every day | ORAL | Status: DC

## 2015-11-13 MED ORDER — ICOSAPENT ETHYL 1 G PO CAPS: 2.0000 | ORAL_CAPSULE | Freq: Two times a day (BID) | ORAL | Status: DC

## 2015-11-14 ENCOUNTER — Ambulatory Visit: Payer: BLUE CROSS/BLUE SHIELD | Admitting: Cardiology

## 2015-11-14 ENCOUNTER — Encounter: Payer: BLUE CROSS/BLUE SHIELD | Admitting: Cardiology

## 2015-11-28 ENCOUNTER — Telehealth: Payer: Self-pay | Admitting: Internal Medicine

## 2015-11-28 DIAGNOSIS — E118 Type 2 diabetes mellitus with unspecified complications: Secondary | ICD-10-CM

## 2015-11-28 DIAGNOSIS — I10 Essential (primary) hypertension: Secondary | ICD-10-CM

## 2015-11-28 NOTE — Telephone Encounter (Signed)
Called pt about a referral and he has questions regarding his labs and some medication that was prescribed. (he didn't know the name off hand). Could you please give him a call.

## 2015-11-29 MED ORDER — TELMISARTAN 40 MG PO TABS: 40.0000 mg | ORAL_TABLET | Freq: Every day | ORAL | Status: DC

## 2015-11-29 MED ORDER — CHLORTHALIDONE 25 MG PO TABS: 25.0000 mg | ORAL_TABLET | Freq: Every day | ORAL | Status: DC

## 2015-11-29 NOTE — Telephone Encounter (Signed)
LVM for pt to call back as soon as possible.   RE: alternative sent to [facility]. MD noted below.

## 2015-11-29 NOTE — Addendum Note (Signed)
Addended by: Janith Lima on: 11/29/2015 10:07 AM   Modules accepted: Orders, Medications

## 2015-11-29 NOTE — Telephone Encounter (Signed)
Contacted pt:   Medication Mike Orozco is too expensive. Is there an alternative for patient? pls advise

## 2015-11-29 NOTE — Telephone Encounter (Signed)
Patient called back. I informed him of the notes.

## 2015-11-29 NOTE — Telephone Encounter (Signed)
Changed to 2 generics 

## 2015-12-20 ENCOUNTER — Ambulatory Visit: Payer: PRIVATE HEALTH INSURANCE | Admitting: Internal Medicine

## 2016-01-19 NOTE — Progress Notes (Deleted)
HPI: FU nonischemic cardiomyopathy with EF initially at 15-20%. Cardiac catheterization in 03/2010 revealed normal coronary arteries. Last echocardiogram in May 2015 showed mild LVE, mild LVH, EF 50-55, grade 1 DD and mildly dilated aortic root. Since last seen,   Current Outpatient Prescriptions  Medication Sig Dispense Refill  . albuterol (PROVENTIL HFA;VENTOLIN HFA) 108 (90 BASE) MCG/ACT inhaler Inhale 2 puffs into the lungs every 6 (six) hours as needed for wheezing or shortness of breath. 1 Inhaler 2  . aspirin EC 81 MG tablet Take 1 tablet (81 mg total) by mouth daily. 30 tablet 11  . atorvastatin (LIPITOR) 40 MG tablet Take 1 tablet (40 mg total) by mouth daily. 90 tablet 3  . carvedilol (COREG) 25 MG tablet Take 1 tablet (25 mg total) by mouth 2 (two) times daily. 60 tablet 4  . cetirizine (ZYRTEC) 10 MG chewable tablet Chew 1 tablet (10 mg total) by mouth daily. 10 tablet 0  . chlorthalidone (HYGROTON) 25 MG tablet Take 1 tablet (25 mg total) by mouth daily. 90 tablet 1  . ferrous sulfate 325 (65 FE) MG tablet Take 325 mg by mouth daily with breakfast.    . Icosapent Ethyl (VASCEPA) 1 g CAPS Take 2 capsules by mouth 2 (two) times daily. 120 capsule 11  . Lancets (ONETOUCH ULTRASOFT) lancets Use 2x a day 200 each 11  . ONE TOUCH ULTRA TEST test strip TEST BLOOD SUGAR BEFORE AND AFTER BREAKFAST,LUND AND DINNER AND AT BEDTIME 700 each 3  . telmisartan (MICARDIS) 40 MG tablet Take 1 tablet (40 mg total) by mouth daily. 90 tablet 1   No current facility-administered medications for this visit.      Past Medical History:  Diagnosis Date  . Anemia   . Arthritis   . CHF (congestive heart failure) ([place])   . Chronic renal insufficiency baseline CRE  1.5--1.8   NEPHROLOGIST--  DR WEBB ([facility])  . Diabetes mellitus type 2, insulin dependent ([facility]) PER pt and LAST PCP NOTE CBG'S IN 100'   RECENTLY UNCONTROLLED AIC 07-07-2012  9.4 AND CBG'S 400'S  . GERD (gastroesophageal  reflux disease)   . Gout, unspecified    stable  per pt  . Heart block AV first degree   . Heart failure, chronic systolic ([place])   . History of ETOH abuse   . Hypertension    CARDIOLOGIST-  DR Stanford Breed--- LOV IN [facility] 09-08-2011  . Nephrolithiasis    RIGHT  . Non-ischemic cardiomyopathy ([place]) SECONDARY TO HX UNCONTROLLED HTN AND ETOH ABUSE----  LAST ECHO EF 50%  09-22-2011   a) Echo 03/25/2010: EF 20-25%; mild AI; Mod MR; Mild LAE  b)cardiac cath 03/26/2010: normal cors; severe pulmonary HTN;PCWP 41; EF 10-15%;  c. echo 4/12:  EF 45-50%, mild LVH, grade 1 diast dysfxn, mild AI, mild LAE  . Personal history of colonic polyps-adenoma 02/17/2012   01/2012 - diminutive adenoma Repeat colon about 01/2017    . Polycystic kidney disease    BILATERAL RENAL CYST  . Pure hyperglyceridemia     Past Surgical History:  Procedure Laterality Date  . CARDIAC CATHETERIZATION  03-26-2010  DR [place]   SEVERE LV DYSFUNCTION WITH MARKEDLY ELEVATED FILLING PRESSURE/ SEVERE PULMONARY HYPERTENSION/ NORMAL CORONARY ANATOMY  . colonoscopy    . CYSTOSCOPY WITH RETROGRADE PYELOGRAM, URETEROSCOPY AND STENT PLACEMENT Right 08/18/2012   Procedure: CYSTOSCOPY WITH RETROGRADE PYELOGRAM, URETEROSCOPY AND STENT PLACEMENT;  Surgeon: Molli Hazard, MD;  Location: [facility];  Service: Urology;  Laterality: Right;  . KIDNEY STONE SURGERY     X 2  . KNEE ARTHROSCOPY W/ MENISCECTOMY Left 02-06-2011   AND CHONDROPLASTY  . LUMBAR FUSION  08-10-2003   L5  --  S1  . NEPHROLITHOTOMY Right 09/15/2012   Procedure: NEPHROLITHOTOMY PERCUTANEOUS;  Surgeon: Molli Hazard, MD;  Location: [facility];  Service: Urology;  Laterality: Right;  RIGHT PERCUTANEOUS NEPHROSTOLITHOTOMY   . REMOVAL CYST LEFT LUNG  1994   AT [facility]   benign  . RHINOPLASTY  1970'S   nose fx  . TRANSTHORACIC ECHOCARDIOGRAM  09-22-2011  DR Cindee Mclester   EF 50%/  MILD LVH/ GRADE I DIASTOLIC DYSFUNCTION/ MILD TR    Social History   Social  History  . Marital status: Married    Spouse name: N/A  . Number of children: 1  . Years of education: N/A   Occupational History  . [employer]   Social History Main Topics  . Smoking status: Current Every Day Smoker    Packs/day: 0.00    Years: 32.00    Types: Cigars    Last attempt to quit: 02/27/2007  . Smokeless tobacco: Never Used  . Alcohol use No     Comment: HX ALCOHOL ABUSE--  STATES  Quit 02/2010  . Drug use: No  . Sexual activity: Not on file   Other Topics Concern  . Not on file   Social History Narrative   No regular exercise   Daily caffeine          Family History  Problem Relation Age of Onset  . Diabetes Mother   . Hypertension Mother   . Diabetes Father   . Hypertension Father   . Colon cancer Neg Hx     ROS: no fevers or chills, productive cough, hemoptysis, dysphasia, odynophagia, melena, hematochezia, dysuria, hematuria, rash, seizure activity, orthopnea, PND, pedal edema, claudication. Remaining systems are negative.  Physical Exam: Well-developed well-nourished in no acute distress.  Skin is warm and dry.  HEENT is normal.  Neck is supple.  Chest is clear to auscultation with normal expansion.  Cardiovascular exam is regular rate and rhythm.  Abdominal exam nontender or distended. No masses palpated. Extremities show no edema. neuro grossly intact  ECG

## 2016-01-23 ENCOUNTER — Ambulatory Visit: Payer: BLUE CROSS/BLUE SHIELD | Admitting: Cardiology

## 2016-02-11 NOTE — Progress Notes (Deleted)
HPI: FU nonischemic cardiomyopathy with EF initially at 15-20%. Cardiac catheterization in 03/2010 revealed normal coronary arteries. Last echocardiogram in May 2013 showed improved LV function with an ejection fraction of 50% and grade 1 diastolic dysfunction. He was last seen in April of 2014. Last echocardiogram June 2015 showed ejection fraction 50-55% and grade 1 diastolic dysfunction. Since then,   Current Outpatient Prescriptions  Medication Sig Dispense Refill  . albuterol (PROVENTIL HFA;VENTOLIN HFA) 108 (90 BASE) MCG/ACT inhaler Inhale 2 puffs into the lungs every 6 (six) hours as needed for wheezing or shortness of breath. 1 Inhaler 2  . aspirin EC 81 MG tablet Take 1 tablet (81 mg total) by mouth daily. 30 tablet 11  . atorvastatin (LIPITOR) 40 MG tablet Take 1 tablet (40 mg total) by mouth daily. 90 tablet 3  . carvedilol (COREG) 25 MG tablet Take 1 tablet (25 mg total) by mouth 2 (two) times daily. 60 tablet 4  . cetirizine (ZYRTEC) 10 MG chewable tablet Chew 1 tablet (10 mg total) by mouth daily. 10 tablet 0  . chlorthalidone (HYGROTON) 25 MG tablet Take 1 tablet (25 mg total) by mouth daily. 90 tablet 1  . ferrous sulfate 325 (65 FE) MG tablet Take 325 mg by mouth daily with breakfast.    . Icosapent Ethyl (VASCEPA) 1 g CAPS Take 2 capsules by mouth 2 (two) times daily. 120 capsule 11  . Lancets (ONETOUCH ULTRASOFT) lancets Use 2x a day 200 each 11  . ONE TOUCH ULTRA TEST test strip TEST BLOOD SUGAR BEFORE AND AFTER BREAKFAST,LUND AND DINNER AND AT BEDTIME 700 each 3  . telmisartan (MICARDIS) 40 MG tablet Take 1 tablet (40 mg total) by mouth daily. 90 tablet 1   No current facility-administered medications for this visit.      Past Medical History:  Diagnosis Date  . Anemia   . Arthritis   . CHF (congestive heart failure) ([place])   . Chronic renal insufficiency baseline CRE  1.5--1.8   NEPHROLOGIST--  DR WEBB ([facility])  . Diabetes mellitus type 2, insulin  dependent (HCC) PER pt and LAST PCP NOTE CBG'S IN 100'   RECENTLY UNCONTROLLED AIC 07-07-2012  9.4 AND CBG'S 400'S  . GERD (gastroesophageal reflux disease)   . Gout, unspecified    stable  per pt  . Heart block AV first degree   . Heart failure, chronic systolic ([place])   . History of ETOH abuse   . Hypertension    CARDIOLOGIST-  DR Stanford Breed--- LOV IN [facility] 09-08-2011  . Nephrolithiasis    RIGHT  . Non-ischemic cardiomyopathy ([place]) SECONDARY TO HX UNCONTROLLED HTN AND ETOH ABUSE----  LAST ECHO EF 50%  09-22-2011   a) Echo 03/25/2010: EF 20-25%; mild AI; Mod MR; Mild LAE  b)cardiac cath 03/26/2010: normal cors; severe pulmonary HTN;PCWP 41; EF 10-15%;  c. echo 4/12:  EF 45-50%, mild LVH, grade 1 diast dysfxn, mild AI, mild LAE  . Personal history of colonic polyps-adenoma 02/17/2012   01/2012 - diminutive adenoma Repeat colon about 01/2017    . Polycystic kidney disease    BILATERAL RENAL CYST  . Pure hyperglyceridemia     Past Surgical History:  Procedure Laterality Date  . CARDIAC CATHETERIZATION  03-26-2010  DR Martinique   SEVERE LV DYSFUNCTION WITH MARKEDLY ELEVATED FILLING PRESSURE/ SEVERE PULMONARY HYPERTENSION/ NORMAL CORONARY ANATOMY  . colonoscopy    . CYSTOSCOPY WITH RETROGRADE PYELOGRAM, URETEROSCOPY AND STENT PLACEMENT Right 08/18/2012   Procedure: CYSTOSCOPY WITH RETROGRADE  PYELOGRAM, URETEROSCOPY AND STENT PLACEMENT;  Surgeon: Molli Hazard, MD;  Location: [facility];  Service: Urology;  Laterality: Right;  . KIDNEY STONE SURGERY     X 2  . KNEE ARTHROSCOPY W/ MENISCECTOMY Left 02-06-2011   AND CHONDROPLASTY  . LUMBAR FUSION  08-10-2003   L5  --  S1  . NEPHROLITHOTOMY Right 09/15/2012   Procedure: NEPHROLITHOTOMY PERCUTANEOUS;  Surgeon: Molli Hazard, MD;  Location: [facility];  Service: Urology;  Laterality: Right;  RIGHT PERCUTANEOUS NEPHROSTOLITHOTOMY   . REMOVAL CYST LEFT LUNG  1994   AT [facility]   benign  . RHINOPLASTY  1970'S   nose fx  .  TRANSTHORACIC ECHOCARDIOGRAM  09-22-2011  DR Chrishonda Hesch   EF 50%/  MILD LVH/ GRADE I DIASTOLIC DYSFUNCTION/ MILD TR    Social History   Social History  . Marital status: Married    Spouse name: N/A  . Number of children: 1  . Years of education: N/A   Occupational History  . [employer]   Social History Main Topics  . Smoking status: Current Every Day Smoker    Packs/day: 0.00    Years: 32.00    Types: Cigars    Last attempt to quit: 02/27/2007  . Smokeless tobacco: Never Used  . Alcohol use No     Comment: HX ALCOHOL ABUSE--  STATES  Quit 02/2010  . Drug use: No  . Sexual activity: Not on file   Other Topics Concern  . Not on file   Social History Narrative   No regular exercise   Daily caffeine          Family History  Problem Relation Age of Onset  . Diabetes Mother   . Hypertension Mother   . Diabetes Father   . Hypertension Father   . Colon cancer Neg Hx     ROS: no fevers or chills, productive cough, hemoptysis, dysphasia, odynophagia, melena, hematochezia, dysuria, hematuria, rash, seizure activity, orthopnea, PND, pedal edema, claudication. Remaining systems are negative.  Physical Exam: Well-developed well-nourished in no acute distress.  Skin is warm and dry.  HEENT is normal.  Neck is supple.  Chest is clear to auscultation with normal expansion.  Cardiovascular exam is regular rate and rhythm.  Abdominal exam nontender or distended. No masses palpated. Extremities show no edema. neuro grossly intact  ECG

## 2016-02-12 ENCOUNTER — Ambulatory Visit: Payer: PRIVATE HEALTH INSURANCE | Admitting: Internal Medicine

## 2016-02-13 ENCOUNTER — Ambulatory Visit: Payer: PRIVATE HEALTH INSURANCE | Admitting: Cardiology

## 2016-02-21 ENCOUNTER — Ambulatory Visit: Payer: Self-pay | Admitting: Internal Medicine

## 2016-02-27 ENCOUNTER — Ambulatory Visit: Payer: PRIVATE HEALTH INSURANCE | Admitting: Cardiology

## 2016-03-03 NOTE — Progress Notes (Deleted)
HPI: FU nonischemic cardiomyopathy with EF initially at 15-20%. Cardiac catheterization in 03/2010 revealed normal coronary arteries. Last echocardiogram in June 2015 showed mild left ventricular enlargement, mild left ventricular hypertrophy, normal LV systolic function and grade 1 diastolic dysfunction. Since last seen,   Current Outpatient Prescriptions  Medication Sig Dispense Refill  . albuterol (PROVENTIL HFA;VENTOLIN HFA) 108 (90 BASE) MCG/ACT inhaler Inhale 2 puffs into the lungs every 6 (six) hours as needed for wheezing or shortness of breath. 1 Inhaler 2  . aspirin EC 81 MG tablet Take 1 tablet (81 mg total) by mouth daily. 30 tablet 11  . atorvastatin (LIPITOR) 40 MG tablet Take 1 tablet (40 mg total) by mouth daily. 90 tablet 3  . carvedilol (COREG) 25 MG tablet Take 1 tablet (25 mg total) by mouth 2 (two) times daily. 60 tablet 4  . cetirizine (ZYRTEC) 10 MG chewable tablet Chew 1 tablet (10 mg total) by mouth daily. 10 tablet 0  . chlorthalidone (HYGROTON) 25 MG tablet Take 1 tablet (25 mg total) by mouth daily. 90 tablet 1  . ferrous sulfate 325 (65 FE) MG tablet Take 325 mg by mouth daily with breakfast.    . Icosapent Ethyl (VASCEPA) 1 g CAPS Take 2 capsules by mouth 2 (two) times daily. 120 capsule 11  . Lancets (ONETOUCH ULTRASOFT) lancets Use 2x a day 200 each 11  . ONE TOUCH ULTRA TEST test strip TEST BLOOD SUGAR BEFORE AND AFTER BREAKFAST,LUND AND DINNER AND AT BEDTIME 700 each 3  . telmisartan (MICARDIS) 40 MG tablet Take 1 tablet (40 mg total) by mouth daily. 90 tablet 1   No current facility-administered medications for this visit.      Past Medical History:  Diagnosis Date  . Anemia   . Arthritis   . CHF (congestive heart failure) ([place])   . Chronic renal insufficiency baseline CRE  1.5--1.8   NEPHROLOGIST--  DR WEBB ([facility])  . Diabetes mellitus type 2, insulin dependent (HCC) PER pt and LAST PCP NOTE CBG'S IN 100'   RECENTLY UNCONTROLLED AIC  07-07-2012  9.4 AND CBG'S 400'S  . GERD (gastroesophageal reflux disease)   . Gout, unspecified    stable  per pt  . Heart block AV first degree   . Heart failure, chronic systolic ([place])   . History of ETOH abuse   . Hypertension    CARDIOLOGIST-  DR Stanford Breed--- [facility]OV IN [facility] 09-08-2011  . Nephrolithiasis    RIGHT  . Non-ischemic cardiomyopathy ([place]) SECONDARY TO HX UNCONTROLLED HTN AND ETOH ABUSE----  LAST ECHO EF 50%  09-22-2011   a) Echo 03/25/2010: EF 20-25%; mild AI; Mod MR; Mild LAE  b)cardiac cath 03/26/2010: normal cors; severe pulmonary HTN;PCWP 41; EF 10-15%;  c. echo 4/12:  EF 45-50%, mild LVH, grade 1 diast dysfxn, mild AI, mild LAE  . Personal history of colonic polyps-adenoma 02/17/2012   01/2012 - diminutive adenoma Repeat colon about 01/2017    . Polycystic kidney disease    BILATERAL RENAL CYST  . Pure hyperglyceridemia     Past Surgical History:  Procedure Laterality Date  . CARDIAC CATHETERIZATION  03-26-2010  DR Martinique   SEVERE LV DYSFUNCTION WITH MARKEDLY ELEVATED FILLING PRESSURE/ SEVERE PULMONARY HYPERTENSION/ NORMAL CORONARY ANATOMY  . colonoscopy    . CYSTOSCOPY WITH RETROGRADE PYELOGRAM, URETEROSCOPY AND STENT PLACEMENT Right 08/18/2012   Procedure: CYSTOSCOPY WITH RETROGRADE PYELOGRAM, URETEROSCOPY AND STENT PLACEMENT;  Surgeon: Molli Hazard, MD;  Location: [facility];  Service: Urology;  Laterality: Right;  . KIDNEY STONE SURGERY     X 2  . KNEE ARTHROSCOPY W/ MENISCECTOMY Left 02-06-2011   AND CHONDROPLASTY  . LUMBAR FUSION  08-10-2003   L5  --  S1  . NEPHROLITHOTOMY Right 09/15/2012   Procedure: NEPHROLITHOTOMY PERCUTANEOUS;  Surgeon: Molli Hazard, MD;  [facility];  Service: Urology;  Laterality: Right;  RIGHT PERCUTANEOUS NEPHROSTOLITHOTOMY   . REMOVAL CYST LEFT LUNG  1994   AT [facility]   benign  . RHINOPLASTY  1970'S   nose fx  . TRANSTHORACIC ECHOCARDIOGRAM  09-22-2011  DR Merlyn Bollen   EF 50%/  MILD LVH/ GRADE I  DIASTOLIC DYSFUNCTION/ MILD TR    Social History   Social History  . Marital status: Married    Spouse name: N/A  . Number of children: 1  . Years of education: N/A   Occupational History  . [employer]   Social History Main Topics  . Smoking status: Current Every Day Smoker    Packs/day: 0.00    Years: 32.00    Types: Cigars    Last attempt to quit: 02/27/2007  . Smokeless tobacco: Never Used  . Alcohol use No     Comment: HX ALCOHOL ABUSE--  STATES  Quit 02/2010  . Drug use: No  . Sexual activity: Not on file   Other Topics Concern  . Not on file   Social History Narrative   No regular exercise   Daily caffeine          Family History  Problem Relation Age of Onset  . Diabetes Mother   . Hypertension Mother   . Diabetes Father   . Hypertension Father   . Colon cancer Neg Hx     ROS: no fevers or chills, productive cough, hemoptysis, dysphasia, odynophagia, melena, hematochezia, dysuria, hematuria, rash, seizure activity, orthopnea, PND, pedal edema, claudication. Remaining systems are negative.  Physical Exam: Well-developed well-nourished in no acute distress.  Skin is warm and dry.  HEENT is normal.  Neck is supple.  Chest is clear to auscultation with normal expansion.  Cardiovascular exam is regular rate and rhythm.  Abdominal exam nontender or distended. No masses palpated. Extremities show no edema. neuro grossly intact  ECG

## 2016-03-12 ENCOUNTER — Ambulatory Visit: Payer: Self-pay | Admitting: Cardiology

## 2016-03-26 ENCOUNTER — Ambulatory Visit: Payer: Self-pay | Admitting: Cardiology

## 2016-04-21 ENCOUNTER — Encounter: Payer: Self-pay | Admitting: Internal Medicine

## 2016-04-30 ENCOUNTER — Ambulatory Visit: Payer: Self-pay | Admitting: Cardiology

## 2016-05-22 NOTE — Progress Notes (Deleted)
HPI: FU nonischemic cardiomyopathy with EF initially at 15-20%. Cardiac catheterization in 03/2010 revealed normal coronary arteries. Last echocardiogram June 2015 showed normal LV function, grade 1 diastolic dysfunction and mild left atrial enlargement. Since then,   Current Outpatient Prescriptions  Medication Sig Dispense Refill  . albuterol (PROVENTIL HFA;VENTOLIN HFA) 108 (90 BASE) MCG/ACT inhaler Inhale 2 puffs into the lungs every 6 (six) hours as needed for wheezing or shortness of breath. 1 Inhaler 2  . aspirin EC 81 MG tablet Take 1 tablet (81 mg total) by mouth daily. 30 tablet 11  . atorvastatin (LIPITOR) 40 MG tablet Take 1 tablet (40 mg total) by mouth daily. 90 tablet 3  . carvedilol (COREG) 25 MG tablet Take 1 tablet (25 mg total) by mouth 2 (two) times daily. 60 tablet 4  . cetirizine (ZYRTEC) 10 MG chewable tablet Chew 1 tablet (10 mg total) by mouth daily. 10 tablet 0  . chlorthalidone (HYGROTON) 25 MG tablet Take 1 tablet (25 mg total) by mouth daily. 90 tablet 1  . ferrous sulfate 325 (65 FE) MG tablet Take 325 mg by mouth daily with breakfast.    . Icosapent Ethyl (VASCEPA) 1 g CAPS Take 2 capsules by mouth 2 (two) times daily. 120 capsule 11  . Lancets (ONETOUCH ULTRASOFT) lancets Use 2x a day 200 each 11  . ONE TOUCH ULTRA TEST test strip TEST BLOOD SUGAR BEFORE AND AFTER BREAKFAST,LUND AND DINNER AND AT BEDTIME 700 each 3  . telmisartan (MICARDIS) 40 MG tablet Take 1 tablet (40 mg total) by mouth daily. 90 tablet 1   No current facility-administered medications for this visit.      Past Medical History:  Diagnosis Date  . Anemia   . Arthritis   . CHF (congestive heart failure) ([place])   . Chronic renal insufficiency baseline CRE  1.5--1.8   NEPHROLOGIST--  DR WEBB ([facility])  . Diabetes mellitus type 2, insulin dependent (HCC) PER pt and LAST PCP NOTE CBG'S IN 100'   RECENTLY UNCONTROLLED AIC 07-07-2012  9.4 AND CBG'S 400'S  . GERD  (gastroesophageal reflux disease)   . Gout, unspecified    stable  per pt  . Heart block AV first degree   . Heart failure, chronic systolic ([place])   . History of ETOH abuse   . Hypertension    CARDIOLOGIST-  DR Stanford Breed--- LOV IN [facility] 09-08-2011  . Nephrolithiasis    RIGHT  . Non-ischemic cardiomyopathy ([place]) SECONDARY TO HX UNCONTROLLED HTN AND ETOH ABUSE----  LAST ECHO EF 50%  09-22-2011   a) Echo 03/25/2010: EF 20-25%; mild AI; Mod MR; Mild LAE  b)cardiac cath 03/26/2010: normal cors; severe pulmonary HTN;PCWP 41; EF 10-15%;  c. echo 4/12:  EF 45-50%, mild LVH, grade 1 diast dysfxn, mild AI, mild LAE  . Personal history of colonic polyps-adenoma 02/17/2012   01/2012 - diminutive adenoma Repeat colon about 01/2017    . Polycystic kidney disease    BILATERAL RENAL CYST  . Pure hyperglyceridemia     Past Surgical History:  Procedure Laterality Date  . CARDIAC CATHETERIZATION  03-26-2010  DR Martinique   SEVERE LV DYSFUNCTION WITH MARKEDLY ELEVATED FILLING PRESSURE/ SEVERE PULMONARY HYPERTENSION/ NORMAL CORONARY ANATOMY  . colonoscopy    . CYSTOSCOPY WITH RETROGRADE PYELOGRAM, URETEROSCOPY AND STENT PLACEMENT Right 08/18/2012   Procedure: CYSTOSCOPY WITH RETROGRADE PYELOGRAM, URETEROSCOPY AND STENT PLACEMENT;  Surgeon: Molli Hazard, MD;  Location: [facility];  Service: Urology;  Laterality: Right;  .  KIDNEY STONE SURGERY     X 2  . KNEE ARTHROSCOPY W/ MENISCECTOMY Left 02-06-2011   AND CHONDROPLASTY  . LUMBAR FUSION  08-10-2003   L5  --  S1  . NEPHROLITHOTOMY Right 09/15/2012   Procedure: NEPHROLITHOTOMY PERCUTANEOUS;  Surgeon: Molli Hazard, MD;  Location[facility];  Service: Urology;  Laterality: Right;  RIGHT PERCUTANEOUS NEPHROSTOLITHOTOMY   . REMOVAL CYST LEFT LUNG  1994   AT [facility]   benign  . RHINOPLASTY  1970'S   nose fx  . TRANSTHORACIC ECHOCARDIOGRAM  09-22-2011  DR Halley Kincer   EF 50%/  MILD LVH/ GRADE I DIASTOLIC DYSFUNCTION/ MILD TR    Social  History   Social History  . Marital status: Married    Spouse name: N/A  . Number of children: 1  . Years of education: N/A   Occupational History  . [employer]   Social History Main Topics  . Smoking status: Current Every Day Smoker    Packs/day: 0.00    Years: 32.00    Types: Cigars    Last attempt to quit: 02/27/2007  . Smokeless tobacco: Never Used  . Alcohol use No     Comment: HX ALCOHOL ABUSE--  STATES  Quit 02/2010  . Drug use: No  . Sexual activity: Not on file   Other Topics Concern  . Not on file   Social History Narrative   No regular exercise   Daily caffeine          Family History  Problem Relation Age of Onset  . Diabetes Mother   . Hypertension Mother   . Diabetes Father   . Hypertension Father   . Colon cancer Neg Hx     ROS: no fevers or chills, productive cough, hemoptysis, dysphasia, odynophagia, melena, hematochezia, dysuria, hematuria, rash, seizure activity, orthopnea, PND, pedal edema, claudication. Remaining systems are negative.  Physical Exam: Well-developed well-nourished in no acute distress.  Skin is warm and dry.  HEENT is normal.  Neck is supple.  Chest is clear to auscultation with normal expansion.  Cardiovascular exam is regular rate and rhythm.  Abdominal exam nontender or distended. No masses palpated. Extremities show no edema. neuro grossly intact  ECG

## 2016-05-28 ENCOUNTER — Ambulatory Visit: Payer: Self-pay | Admitting: Cardiology

## 2016-05-28 ENCOUNTER — Encounter: Payer: Self-pay | Admitting: Internal Medicine

## 2016-07-04 LAB — CBC AND DIFFERENTIAL
HCT: 38 % — AB (ref 41–53)
HEMOGLOBIN: 13.2 g/dL — AB (ref 13.5–17.5)
Platelets: 249 10*3/uL (ref 150–399)
WBC: 4 10*3/mL

## 2016-07-04 LAB — BASIC METABOLIC PANEL
BUN: 19 mg/dL (ref 4–21)
CREATININE: 1.1 mg/dL (ref 0.6–1.3)
Glucose: 166 mg/dL
Potassium: 3.8 mmol/L (ref 3.4–5.3)
SODIUM: 138 mmol/L (ref 137–147)

## 2016-07-04 LAB — HEMOGLOBIN A1C: HEMOGLOBIN A1C: 6

## 2016-07-04 LAB — TSH: TSH: 7.88 u[IU]/mL — AB (ref 0.41–5.90)

## 2016-07-04 LAB — VITAMIN D 25 HYDROXY (VIT D DEFICIENCY, FRACTURES): Vit D, 25-Hydroxy: 18.8

## 2016-07-12 ENCOUNTER — Ambulatory Visit: Payer: Self-pay | Admitting: Internal Medicine

## 2016-07-14 ENCOUNTER — Encounter: Payer: Self-pay | Admitting: Internal Medicine

## 2016-07-14 ENCOUNTER — Ambulatory Visit (INDEPENDENT_AMBULATORY_CARE_PROVIDER_SITE_OTHER): Payer: 59 | Admitting: Internal Medicine

## 2016-07-14 VITALS — BP 124/78 | HR 64 | Temp 98.3°F | Resp 16 | Ht 68.0 in | Wt 183.0 lb

## 2016-07-14 DIAGNOSIS — B9789 Other viral agents as the cause of diseases classified elsewhere: Secondary | ICD-10-CM

## 2016-07-14 DIAGNOSIS — J069 Acute upper respiratory infection, unspecified: Secondary | ICD-10-CM | POA: Diagnosis not present

## 2016-07-14 DIAGNOSIS — R059 Cough, unspecified: Secondary | ICD-10-CM

## 2016-07-14 DIAGNOSIS — R05 Cough: Secondary | ICD-10-CM | POA: Diagnosis not present

## 2016-07-14 DIAGNOSIS — Z23 Encounter for immunization: Secondary | ICD-10-CM | POA: Diagnosis not present

## 2016-07-14 LAB — POCT EXHALED NITRIC OXIDE: FENO LEVEL (PPB): 8

## 2016-07-14 MED ORDER — PROMETHAZINE-DM 6.25-15 MG/5ML PO SYRP: 5.0000 mL | ORAL_SOLUTION | Freq: Four times a day (QID) | ORAL | 0 refills | Status: DC | PRN

## 2016-07-14 NOTE — Patient Instructions (Signed)
Cough, Adult Coughing is a reflex that clears your throat and your airways. Coughing helps to heal and protect your lungs. It is normal to cough occasionally, but a cough that happens with other symptoms or lasts a long time may be a sign of a condition that needs treatment. A cough may last only 2-3 weeks (acute), or it may last longer than 8 weeks (chronic). What are the causes? Coughing is commonly caused by:  Breathing in substances that irritate your lungs.  A viral or bacterial respiratory infection.  Allergies.  Asthma.  Postnasal drip.  Smoking.  Acid backing up from the stomach into the esophagus (gastroesophageal reflux).  Certain medicines.  Chronic lung problems, including COPD (or rarely, lung cancer).  Other medical conditions such as heart failure.  Follow these instructions at home: Pay attention to any changes in your symptoms. Take these actions to help with your discomfort:  Take medicines only as told by your health care provider. ? If you were prescribed an antibiotic medicine, take it as told by your health care provider. Do not stop taking the antibiotic even if you start to feel better. ? Talk with your health care provider before you take a cough suppressant medicine.  Drink enough fluid to keep your urine clear or pale yellow.  If the air is dry, use a cold steam vaporizer or humidifier in your bedroom or your home to help loosen secretions.  Avoid anything that causes you to cough at work or at home.  If your cough is worse at night, try sleeping in a semi-upright position.  Avoid cigarette smoke. If you smoke, quit smoking. If you need help quitting, ask your health care provider.  Avoid caffeine.  Avoid alcohol.  Rest as needed.  Contact a health care provider if:  You have new symptoms.  You cough up pus.  Your cough does not get better after 2-3 weeks, or your cough gets worse.  You cannot control your cough with suppressant  medicines and you are losing sleep.  You develop pain that is getting worse or pain that is not controlled with pain medicines.  You have a fever.  You have unexplained weight loss.  You have night sweats. Get help right away if:  You cough up blood.  You have difficulty breathing.  Your heartbeat is very fast. This information is not intended to replace advice given to you by your health care provider. Make sure you discuss any questions you have with your health care provider. Document Released: 11/01/2010 Document Revised: 10/11/2015 Document Reviewed: 07/12/2014 Elsevier Interactive Patient Education  2017 [employer]  

## 2016-07-14 NOTE — Progress Notes (Signed)
Subjective:  Patient ID: Mike Orozco, male    DOB: 10/10/1961  Age: 56 y.o. MRN: [id number]  CC: Cough   HPI Mike Orozco presents for A 3 day history of nonproductive cough, runny nose, and mild wheezing. The wheezing has been adequately treated with albuterol.  Outpatient Medications Prior to Visit  Medication Sig Dispense Refill  . albuterol (PROVENTIL HFA;VENTOLIN HFA) 108 (90 BASE) MCG/ACT inhaler Inhale 2 puffs into the lungs every 6 (six) hours as needed for wheezing or shortness of breath. 1 Inhaler 2  . aspirin EC 81 MG tablet Take 1 tablet (81 mg total) by mouth daily. 30 tablet 11  . atorvastatin (LIPITOR) 40 MG tablet Take 1 tablet (40 mg total) by mouth daily. 90 tablet 3  . carvedilol (COREG) 25 MG tablet Take 1 tablet (25 mg total) by mouth 2 (two) times daily. 60 tablet 4  . chlorthalidone (HYGROTON) 25 MG tablet Take 1 tablet (25 mg total) by mouth daily. 90 tablet 1  . ferrous sulfate 325 (65 FE) MG tablet Take 325 mg by mouth daily with breakfast.    . Icosapent Ethyl (VASCEPA) 1 g CAPS Take 2 capsules by mouth 2 (two) times daily. 120 capsule 11  . telmisartan (MICARDIS) 40 MG tablet Take 1 tablet (40 mg total) by mouth daily. 90 tablet 1  . cetirizine (ZYRTEC) 10 MG chewable tablet Chew 1 tablet (10 mg total) by mouth daily. 10 tablet 0  . Lancets (ONETOUCH ULTRASOFT) lancets Use 2x a day 200 each 11  . ONE TOUCH ULTRA TEST test strip TEST BLOOD SUGAR BEFORE AND AFTER BREAKFAST,LUND AND DINNER AND AT BEDTIME 700 each 3   No facility-administered medications prior to visit.     ROS Review of Systems  Constitutional: Negative for appetite change, chills, diaphoresis, fatigue and fever.  HENT: Positive for congestion, postnasal drip and rhinorrhea. Negative for sinus pressure, sore throat and trouble swallowing.   Eyes: Negative.   Respiratory: Positive for cough and wheezing. Negative for chest tightness, shortness of breath and stridor.   Cardiovascular:  Negative.  Negative for chest pain, palpitations and leg swelling.  Gastrointestinal: Negative for abdominal pain, constipation, diarrhea, nausea and vomiting.  Endocrine: Negative.   Genitourinary: Negative.  Negative for difficulty urinating.  Musculoskeletal: Negative for back pain, myalgias and neck pain.  Skin: Negative.  Negative for color change and rash.  Allergic/Immunologic: Negative.   Neurological: Negative.  Negative for dizziness, weakness, light-headedness and numbness.  Hematological: Negative.  Negative for adenopathy. Does not bruise/bleed easily.  Psychiatric/Behavioral: Negative.     Objective:  BP 124/78 (BP Location: Left Arm, Patient Position: Sitting, Cuff Size: Normal)   Pulse 64   Temp 98.3 F (36.8 C) (Oral)   Resp 16   Ht 5\' 8"  (1.727 m)   Wt 183 lb (83 kg)   SpO2 97%   BMI 27.83 kg/m   BP Readings from Last 3 Encounters:  07/14/16 124/78  11/12/15 (!) 150/100  11/11/15 132/100    Wt Readings from Last 3 Encounters:  07/14/16 183 lb (83 kg)  11/12/15 196 lb 4 oz (89 kg)  04/29/14 204 lb 4 oz (92.6 kg)    Physical Exam  Constitutional: He is oriented to person, place, and time. He appears well-developed and well-nourished.  Non-toxic appearance. He does not have a sickly appearance. He does not appear ill. No distress.  HENT:  Mouth/Throat: Oropharynx is clear and moist. No oropharyngeal exudate.  Eyes: Conjunctivae are normal. Right eye exhibits  no discharge. Left eye exhibits no discharge. No scleral icterus.  Neck: Normal range of motion. Neck supple. No JVD present. No tracheal deviation present. No thyromegaly present.  Cardiovascular: Normal rate, regular rhythm, normal heart sounds and intact distal pulses.  Exam reveals no gallop and no friction rub.   No murmur heard. Pulmonary/Chest: Effort normal and breath sounds normal. No stridor. No respiratory distress. He has no wheezes. He has no rales. He exhibits no tenderness.  Abdominal:  Soft. Bowel sounds are normal. He exhibits no distension and no mass. There is no tenderness. There is no rebound and no guarding.  Musculoskeletal: Normal range of motion. He exhibits no edema, tenderness or deformity.  Lymphadenopathy:    He has no cervical adenopathy.  Neurological: He is oriented to person, place, and time.  Skin: Skin is warm and dry. No rash noted. He is not diaphoretic. No erythema. No pallor.  Psychiatric: He has a normal mood and affect. His behavior is normal. Judgment and thought content normal.  Vitals reviewed.   Lab Results  Component Value Date   WBC 4.0 07/04/2016   HGB 13.2 (A) 07/04/2016   HCT 38 (A) 07/04/2016   PLT 249 07/04/2016   GLUCOSE 175 (H) 11/12/2015   CHOL 228 (H) 11/12/2015   TRIG (H) 11/12/2015    607.0 Triglyceride is over 400; calculations on Lipids are invalid.   HDL 45.30 11/12/2015   LDLDIRECT 119.0 11/12/2015   LDLCALC UNABLE TO CALCULATE IF TRIGLYCERIDE OVER 400 mg/dL 03/23/2013   ALT 39 11/12/2015   AST 22 11/12/2015   NA 138 07/04/2016   K 3.8 07/04/2016   CL 104 11/12/2015   CREATININE 1.1 07/04/2016   BUN 19 07/04/2016   CO2 30 11/12/2015   TSH 7.88 (A) 07/04/2016   PSA 2.27 11/12/2015   INR 1.01 09/15/2012   HGBA1C 6.0 07/04/2016   MICROALBUR 3.3 (H) 11/12/2015    No results found.  Assessment & Plan:   Mike Orozco was seen today for cough.  Diagnoses and all orders for this visit:  Need for prophylactic vaccination and inoculation against influenza -     Flu Vaccine QUAD 36+ mos IM  Cough- his FeNO score is not elevated so I don't think he needs an inhaled or systemic steroid. -     promethazine-dextromethorphan (PROMETHAZINE-DM) 6.25-15 MG/5ML syrup; Take 5 mLs by mouth 4 (four) times daily as needed for cough. -     POCT EXHALED NITRIC OXIDE  Viral URI with cough- will treat his symptoms with Phenergan DM. -     promethazine-dextromethorphan (PROMETHAZINE-DM) 6.25-15 MG/5ML syrup; Take 5 mLs by mouth 4  (four) times daily as needed for cough.   I have discontinued [name] onetouch ultrasoft and ONE TOUCH ULTRA TEST. I am also having him start on promethazine-dextromethorphan. Additionally, I am having him maintain his ferrous sulfate, albuterol, carvedilol, cetirizine, aspirin EC, Icosapent Ethyl, atorvastatin, telmisartan, and chlorthalidone.  Meds ordered this encounter  Medications  . promethazine-dextromethorphan (PROMETHAZINE-DM) 6.25-15 MG/5ML syrup    Sig: Take 5 mLs by mouth 4 (four) times daily as needed for cough.    Dispense:  118 mL    Refill:  0     Follow-up: Return in about 3 weeks (around 08/04/2016).  Scarlette Calico, MD

## 2016-07-14 NOTE — Progress Notes (Signed)
Pre visit review using our clinic review tool, if applicable. No additional management support is needed unless otherwise documented below in the visit note. 

## 2016-07-18 ENCOUNTER — Telehealth: Payer: Self-pay | Admitting: Internal Medicine

## 2016-07-18 MED ORDER — ALBUTEROL SULFATE HFA 108 (90 BASE) MCG/ACT IN AERS: 2.0000 | INHALATION_SPRAY | Freq: Four times a day (QID) | RESPIRATORY_TRACT | 2 refills | Status: DC | PRN

## 2016-07-18 NOTE — Telephone Encounter (Signed)
erx [facility] to [facility] [place].

## 2016-07-18 NOTE — Telephone Encounter (Signed)
albuterol (PROVENTIL HFA;VENTOLIN HFA) 108 (90 BASE) MCG/ACT inhaler   Patient is requesting a refill on this medication. He saw Dr. Ronnald Ramp on Monday. He said that Dr. Ronnald Ramp said he would send this in for him. He believes he may of got it from the ER before. Please advise or follow up with patient. Thank you.

## 2016-07-18 NOTE — Telephone Encounter (Signed)
Pt informed of same.  

## 2016-07-21 ENCOUNTER — Telehealth: Payer: Self-pay

## 2016-07-21 NOTE — Telephone Encounter (Signed)
albuterol FAXED TO [facility]

## 2016-08-08 NOTE — Progress Notes (Signed)
HPI: FU nonischemic cardiomyopathy with EF initially at 15-20%. Cardiac catheterization in 03/2010 revealed normal coronary arteries. Last echocardiogram in June 2015 showed mild LVH, mild left ventricular enlargement, low normal LV function, grade 1 diastolic dysfunction and mildly dilated aortic root. He was last seen in 5/15. Since then, the patient denies any dyspnea on exertion, orthopnea, PND, pedal edema, palpitations, syncope or chest pain.   Current Outpatient Prescriptions  Medication Sig Dispense Refill  . albuterol (PROVENTIL HFA;VENTOLIN HFA) 108 (90 Base) MCG/ACT inhaler Inhale 2 puffs into the lungs every 6 (six) hours as needed for wheezing or shortness of breath. 1 Inhaler 2  . aspirin EC 81 MG tablet Take 1 tablet (81 mg total) by mouth daily. 30 tablet 11  . atorvastatin (LIPITOR) 40 MG tablet Take 1 tablet (40 mg total) by mouth daily. 90 tablet 3  . carvedilol (COREG) 25 MG tablet TAKE 1 TABLET (25 MG TOTAL) BY MOUTH 2 (TWO) TIMES DAILY. 60 tablet 5  . cetirizine (ZYRTEC) 10 MG chewable tablet Chew 1 tablet (10 mg total) by mouth daily. 10 tablet 0  . chlorthalidone (HYGROTON) 25 MG tablet Take 1 tablet (25 mg total) by mouth daily. 90 tablet 1  . ferrous sulfate 325 (65 FE) MG tablet Take 325 mg by mouth daily with breakfast.    . promethazine-dextromethorphan (PROMETHAZINE-DM) 6.25-15 MG/5ML syrup Take 5 mLs by mouth 4 (four) times daily as needed for cough. 118 mL 0  . telmisartan (MICARDIS) 40 MG tablet Take 1 tablet (40 mg total) by mouth daily. 90 tablet 1   No current facility-administered medications for this visit.      Past Medical History:  Diagnosis Date  . Anemia   . Arthritis   . CHF (congestive heart failure) ([place])   . Chronic renal insufficiency baseline CRE  1.5--1.8   NEPHROLOGIST--  DR WEBB ([facility])  . Diabetes mellitus type 2, insulin dependent ([facility]) PER pt and LAST PCP NOTE CBG'S IN 100'   RECENTLY UNCONTROLLED AIC 07-07-2012   9.4 AND CBG'S 400'S  . GERD (gastroesophageal reflux disease)   . Gout, unspecified    stable  per pt  . Heart block AV first degree   . Heart failure, chronic systolic ([place])   . History of ETOH abuse   . Hypertension    CARDIOLOGIST-  DR Stanford Breed--- [facility] 09-08-2011  . Nephrolithiasis    RIGHT  . Non-ischemic cardiomyopathy ([place]) SECONDARY TO HX UNCONTROLLED HTN AND ETOH ABUSE----  LAST ECHO EF 50%  09-22-2011   a) Echo 03/25/2010: EF 20-25%; mild AI; Mod MR; Mild LAE  b)cardiac cath 03/26/2010: normal cors; severe pulmonary HTN;PCWP 41; EF 10-15%;  c. echo 4/12:  EF 45-50%, mild LVH, grade 1 diast dysfxn, mild AI, mild LAE  . Personal history of colonic polyps-adenoma 02/17/2012   01/2012 - diminutive adenoma Repeat colon about 01/2017    . Polycystic kidney disease    BILATERAL RENAL CYST  . Pure hyperglyceridemia     Past Surgical History:  Procedure Laterality Date  . CARDIAC CATHETERIZATION  03-26-2010  DR [place]   SEVERE LV DYSFUNCTION WITH MARKEDLY ELEVATED FILLING PRESSURE/ SEVERE PULMONARY HYPERTENSION/ NORMAL CORONARY ANATOMY  . colonoscopy    . CYSTOSCOPY WITH RETROGRADE PYELOGRAM, URETEROSCOPY AND STENT PLACEMENT Right 08/18/2012   Procedure: CYSTOSCOPY WITH RETROGRADE PYELOGRAM, URETEROSCOPY AND STENT PLACEMENT;  Surgeon: Molli Hazard, MD;  Location: [facility];  Service: Urology;  Laterality: Right;  . KIDNEY STONE  SURGERY     X 2  . KNEE ARTHROSCOPY W/ MENISCECTOMY Left 02-06-2011   AND CHONDROPLASTY  . LUMBAR FUSION  08-10-2003   L5  --  S1  . NEPHROLITHOTOMY Right 09/15/2012   Procedure: NEPHROLITHOTOMY PERCUTANEOUS;  Surgeon: Molli Hazard, MD;  Location: [facility];  Service: Urology;  Laterality: Right;  RIGHT PERCUTANEOUS NEPHROSTOLITHOTOMY   . REMOVAL CYST LEFT LUNG  1994   AT [facility]   benign  . RHINOPLASTY  1970'S   nose fx  . TRANSTHORACIC ECHOCARDIOGRAM  09-22-2011  DR Antwain Caliendo   EF 50%/  MILD LVH/ GRADE I DIASTOLIC  DYSFUNCTION/ MILD TR    Social History   Social History  . Marital status: Married    Spouse name: N/A  . Number of children: 1  . Years of education: N/A   Occupational History  . [employer]   Social History Main Topics  . Smoking status: Current Every Day Smoker    Packs/day: 0.00    Years: 32.00    Types: Cigars    Last attempt to quit: 02/27/2007  . Smokeless tobacco: Never Used  . Alcohol use No     Comment: HX ALCOHOL ABUSE--  STATES  Quit 02/2010  . Drug use: No  . Sexual activity: Not on file   Other Topics Concern  . Not on file   Social History Narrative   No regular exercise   Daily caffeine          Family History  Problem Relation Age of Onset  . Diabetes Mother   . Hypertension Mother   . Diabetes Father   . Hypertension Father   . Colon cancer Neg Hx     ROS: no fevers or chills, productive cough, hemoptysis, dysphasia, odynophagia, melena, hematochezia, dysuria, hematuria, rash, seizure activity, orthopnea, PND, pedal edema, claudication. Remaining systems are negative.  Physical Exam: Well-developed well-nourished in no acute distress.  Skin is warm and dry.  HEENT is normal.  Neck is supple. No bruits Chest is clear to auscultation with normal expansion.  Cardiovascular exam is regular rate and rhythm.  Abdominal exam nontender or distended. No masses palpated. Extremities show no edema. neuro grossly intact  ECG- Sinus bradycardia at a rate of 58. First-degree AV block. Left ventricular hypertrophy. personally reviewed  A/P  1 cardiomyopathy-improved on most recent echocardiogram. Will repeat study. Continue ARB and beta blocker.  2 hypertension-blood pressure controlled. Continue present medications.  3 Renal insufficiency-followed by Dr Justin Mend.  Kirk Ruths, MD

## 2016-08-11 ENCOUNTER — Other Ambulatory Visit: Payer: Self-pay | Admitting: Internal Medicine

## 2016-08-13 ENCOUNTER — Ambulatory Visit (INDEPENDENT_AMBULATORY_CARE_PROVIDER_SITE_OTHER): Payer: 59 | Admitting: Cardiology

## 2016-08-13 ENCOUNTER — Encounter: Payer: Self-pay | Admitting: *Deleted

## 2016-08-13 ENCOUNTER — Encounter: Payer: Self-pay | Admitting: Cardiology

## 2016-08-13 VITALS — BP 136/84 | HR 58 | Ht 68.0 in | Wt 188.0 lb

## 2016-08-13 DIAGNOSIS — I428 Other cardiomyopathies: Secondary | ICD-10-CM | POA: Diagnosis not present

## 2016-08-13 DIAGNOSIS — I10 Essential (primary) hypertension: Secondary | ICD-10-CM

## 2016-08-13 NOTE — Patient Instructions (Signed)

## 2016-08-29 ENCOUNTER — Ambulatory Visit (HOSPITAL_COMMUNITY): Payer: Managed Care, Other (non HMO) | Attending: Cardiovascular Disease

## 2016-08-29 ENCOUNTER — Other Ambulatory Visit: Payer: Self-pay

## 2016-08-29 DIAGNOSIS — I501 Left ventricular failure: Secondary | ICD-10-CM | POA: Diagnosis not present

## 2016-08-29 DIAGNOSIS — I351 Nonrheumatic aortic (valve) insufficiency: Secondary | ICD-10-CM | POA: Diagnosis not present

## 2016-08-29 DIAGNOSIS — I428 Other cardiomyopathies: Secondary | ICD-10-CM | POA: Diagnosis not present

## 2016-09-17 ENCOUNTER — Telehealth: Payer: Self-pay | Admitting: Internal Medicine

## 2016-09-17 NOTE — Telephone Encounter (Signed)
Contacted pt and he stated that he is still swollen and in pain.  Informed pt that he should keep the appt on 09/19/2016 with Dr. Jenny Reichmann.

## 2016-09-17 NOTE — Telephone Encounter (Signed)
Pt has a swollen toe. Was seen in ER and was diagnosed with Gout. Is still having issues. Would like call back. Has appt 5/4 w/ Jenny Reichmann

## 2016-09-19 ENCOUNTER — Ambulatory Visit: Payer: Managed Care, Other (non HMO) | Admitting: Internal Medicine

## 2016-12-08 ENCOUNTER — Other Ambulatory Visit: Payer: Self-pay | Admitting: Internal Medicine

## 2016-12-08 DIAGNOSIS — I10 Essential (primary) hypertension: Secondary | ICD-10-CM

## 2017-02-16 ENCOUNTER — Encounter: Payer: Self-pay | Admitting: Internal Medicine

## 2017-03-09 ENCOUNTER — Other Ambulatory Visit: Payer: Self-pay | Admitting: Internal Medicine

## 2017-03-10 ENCOUNTER — Telehealth: Payer: Self-pay | Admitting: Internal Medicine

## 2017-03-10 DIAGNOSIS — I10 Essential (primary) hypertension: Secondary | ICD-10-CM

## 2017-03-10 MED ORDER — CHLORTHALIDONE 25 MG PO TABS: 25.0000 mg | ORAL_TABLET | Freq: Every day | ORAL | 0 refills | Status: DC

## 2017-03-10 MED ORDER — CARVEDILOL 25 MG PO TABS: 25.0000 mg | ORAL_TABLET | Freq: Two times a day (BID) | ORAL | 0 refills | Status: DC

## 2017-03-10 NOTE — Telephone Encounter (Signed)
Per office policy sent 30 day to local pharmacy until appt.../lmb  

## 2017-03-10 NOTE — Telephone Encounter (Signed)
Pt called requesting a refill on chlorthalidone (HYGROTON) 25 MG tablet. He does have an appointment on November 5th but said that he is completely out. Can this be refilled? Please advise.

## 2017-03-23 ENCOUNTER — Ambulatory Visit: Payer: Managed Care, Other (non HMO) | Admitting: Internal Medicine

## 2017-04-02 ENCOUNTER — Ambulatory Visit: Payer: Managed Care, Other (non HMO) | Admitting: Internal Medicine

## 2017-04-15 ENCOUNTER — Other Ambulatory Visit: Payer: Self-pay | Admitting: Internal Medicine

## 2017-04-15 DIAGNOSIS — I10 Essential (primary) hypertension: Secondary | ICD-10-CM

## 2017-04-15 NOTE — Telephone Encounter (Signed)
Copied from [place] [id number]. Topic: Quick Communication - Rx Refill/Question >> Apr 15, 2017  4:45 PM Malena Catholic I, NT wrote: Has the patient contacted their pharmacy yes   (Agent: If no, request that the patient contact the pharmacy for the refill.   Preferred Pharm[facility] (with phone number [facility] [address] [phone]   Agent: Please be advised that RX refills may take up to 48 hours. We ask that you follow-up with your pharmacy.  Medicine is coreg 25mg 

## 2017-04-15 NOTE — Telephone Encounter (Incomplete)
Copied from [place] [id number]. Topic: Quick Communication - Rx Refill/Question >> Apr 15, 2017  4:47 PM Malena Catholic I, NT wrote: Has the patient contacted their pharmacy? {yes [id number]}  (Agent: If no, request that the patient contact the pharmacy for the refill.)  Preferred Pharmacy (with phone number or street name): [facility] 25 Mg  Agent: Please be advised that RX refills may take up to 48 hours. We ask that you follow-up with your pharmacy.

## 2017-04-16 MED ORDER — CHLORTHALIDONE 25 MG PO TABS: 25.0000 mg | ORAL_TABLET | Freq: Every day | ORAL | 0 refills | Status: DC

## 2017-04-16 MED ORDER — CARVEDILOL 25 MG PO TABS: 25.0000 mg | ORAL_TABLET | Freq: Two times a day (BID) | ORAL | 0 refills | Status: DC

## 2017-04-16 NOTE — Telephone Encounter (Signed)
Medication request: Pt has upcoming appt; can he get enough medication to cover him until he comes to appt?

## 2017-04-16 NOTE — Telephone Encounter (Signed)
See message below °

## 2017-04-16 NOTE — Telephone Encounter (Signed)
appt scheduled for 05/13/17. Per office policy sent 30 day to local pharmacy until appt...Mike Orozco

## 2017-04-21 ENCOUNTER — Encounter: Payer: Managed Care, Other (non HMO) | Admitting: Internal Medicine

## 2017-05-13 ENCOUNTER — Ambulatory Visit: Payer: Managed Care, Other (non HMO) | Admitting: Internal Medicine

## 2017-05-18 ENCOUNTER — Ambulatory Visit: Payer: Managed Care, Other (non HMO) | Admitting: Internal Medicine

## 2017-05-21 ENCOUNTER — Ambulatory Visit: Payer: Managed Care, Other (non HMO) | Admitting: Internal Medicine

## 2017-05-26 MED ORDER — CARVEDILOL 25 MG PO TABS: 25.0000 mg | ORAL_TABLET | Freq: Two times a day (BID) | ORAL | 0 refills | Status: DC

## 2017-05-27 ENCOUNTER — Other Ambulatory Visit: Payer: Self-pay

## 2017-05-27 ENCOUNTER — Ambulatory Visit (AMBULATORY_SURGERY_CENTER): Payer: Self-pay | Admitting: *Deleted

## 2017-05-27 VITALS — Ht 66.0 in | Wt 186.0 lb

## 2017-05-27 DIAGNOSIS — Z86010 Personal history of colonic polyps: Secondary | ICD-10-CM

## 2017-05-27 NOTE — Progress Notes (Signed)
Patient denies any allergies to eggs or soy. Patient denies any problems with anesthesia/sedation. Patient denies any oxygen use at home. Patient denies taking any diet/weight loss medications or blood thinners. EMMI education declined by pt.  

## 2017-06-01 ENCOUNTER — Encounter: Payer: Self-pay | Admitting: Internal Medicine

## 2017-06-01 ENCOUNTER — Other Ambulatory Visit: Payer: Self-pay

## 2017-06-01 ENCOUNTER — Ambulatory Visit (AMBULATORY_SURGERY_CENTER): Payer: Managed Care, Other (non HMO) | Admitting: Internal Medicine

## 2017-06-01 VITALS — BP 143/76 | HR 66 | Temp 99.5°F | Resp 14 | Ht 68.0 in | Wt 188.0 lb

## 2017-06-01 DIAGNOSIS — D125 Benign neoplasm of sigmoid colon: Secondary | ICD-10-CM

## 2017-06-01 DIAGNOSIS — D122 Benign neoplasm of ascending colon: Secondary | ICD-10-CM

## 2017-06-01 DIAGNOSIS — K635 Polyp of colon: Secondary | ICD-10-CM

## 2017-06-01 DIAGNOSIS — D126 Benign neoplasm of colon, unspecified: Secondary | ICD-10-CM | POA: Diagnosis not present

## 2017-06-01 DIAGNOSIS — Z86010 Personal history of colonic polyps: Secondary | ICD-10-CM | POA: Diagnosis not present

## 2017-06-01 LAB — HM COLONOSCOPY

## 2017-06-01 MED ORDER — SODIUM CHLORIDE 0.9 % IV SOLN: 500.0000 mL | Freq: Once | INTRAVENOUS | Status: DC

## 2017-06-01 NOTE — Progress Notes (Signed)
Called to room to assist during endoscopic procedure.  Patient ID and intended procedure confirmed with present staff. Received instructions for my participation in the procedure from the performing physician.  

## 2017-06-01 NOTE — Patient Instructions (Addendum)
I removed 3 tiny polyps today. You still have diverticulosis - thickened muscle rings and pouches in the colon wall. Please read the handout about this condition.   I will let you know pathology results and when to have another routine colonoscopy by mail and/or My Chart.  I appreciate the opportunity to care for you. Gatha Mayer, MD, [facility]   Discharge instructions given. Handouts on polyps and diverticulosis. Resume previous medications. YOU HAD AN ENDOSCOPIC PROCEDURE TODAY AT [facility]:   Refer to the procedure report that was given to you for any specific questions about what was found during the examination.  If the procedure report does not answer your questions, please call your gastroenterologist to clarify.  If you requested that your care partner not be given the details of your procedure findings, then the procedure report has been included in a sealed envelope for you to review at your convenience later.  YOU SHOULD EXPECT: Some feelings of bloating in the abdomen. Passage of more gas than usual.  Walking can help get rid of the air that was put into your GI tract during the procedure and reduce the bloating. If you had a lower endoscopy (such as a colonoscopy or flexible sigmoidoscopy) you may notice spotting of blood in your stool or on the toilet paper. If you underwent a bowel prep for your procedure, you may not have a normal bowel movement for a few days.  Please Note:  You might notice some irritation and congestion in your nose or some drainage.  This is from the oxygen used during your procedure.  There is no need for concern and it should clear up in a day or so.  SYMPTOMS TO REPORT IMMEDIATELY:   Following lower endoscopy (colonoscopy or flexible sigmoidoscopy):  Excessive amounts of blood in the stool  Significant tenderness or worsening of abdominal pains  Swelling of the abdomen that is new, acute  Fever of 100F or higher   For  urgent or emergent issues, a gastroenterologist can be reached at any hour by calling [phone].   DIET:  We do recommend a small meal at first, but then you may proceed to your regular diet.  Drink plenty of fluids but you should avoid alcoholic beverages for 24 hours.  ACTIVITY:  You should plan to take it easy for the rest of today and you should NOT DRIVE or use heavy machinery until tomorrow (because of the sedation medicines used during the test).    FOLLOW UP: Our staff will call the number listed on your records the next business day following your procedure to check on you and address any questions or concerns that you may have regarding the information given to you following your procedure. If we do not reach you, we will leave a message.  However, if you are feeling well and you are not experiencing any problems, there is no need to return our call.  We will assume that you have returned to your regular daily activities without incident.  If any biopsies were taken you will be contacted by phone or by letter within the next 1-3 weeks.  Please call us at [phone] if you have not heard about the biopsies in 3 weeks.    SIGNATURES/CONFIDENTIALITY: You and/or your care partner have signed paperwork which will be entered into your electronic medical record.  These signatures attest to the fact that that the information above on your After Visit Summary has been  reviewed and is understood.  Full responsibility of the confidentiality of this discharge information lies with you and/or your care-partner. 

## 2017-06-01 NOTE — Progress Notes (Signed)
Pt's states no medical or surgical changes since previsit or office visit.Patient consents to observer being present for procedure.  

## 2017-06-01 NOTE — Progress Notes (Signed)
To PACU, VSS. Report to [email or web address] 

## 2017-06-01 NOTE — Op Note (Signed)
[place] Patient Name: Mike Orozco Procedure Date: 06/01/2017 1:29 PM MRN: [id number] Endoscopist: Gatha Mayer , MD Age: 57 Referring MD:  Date of Birth: 10/10/1961 Gender: Male Account #: [id number] Procedure:                Colonoscopy Indications:              Surveillance: Personal history of adenomatous                            polyps on last colonoscopy 5 years ago Medicines:                Propofol per Anesthesia, Monitored Anesthesia Care Procedure:                Pre-Anesthesia Assessment:                           - Prior to the procedure, a History and Physical                            was performed, and patient medications and                            allergies were reviewed. The patient's tolerance of                            previous anesthesia was also reviewed. The risks                            and benefits of the procedure and the sedation                            options and risks were discussed with the patient.                            All questions were answered, and informed consent                            was obtained. Prior Anticoagulants: The patient has                            taken no previous anticoagulant or antiplatelet                            agents. ASA Grade Assessment: III - A patient with                            severe systemic disease. After reviewing the risks                            and benefits, the patient was deemed in                            satisfactory condition to undergo the procedure.  After obtaining informed consent, the colonoscope                            was passed under direct vision. Throughout the                            procedure, the patient's blood pressure, pulse, and                            oxygen saturations were monitored continuously. The                            Colonoscope was introduced through the anus and                             advanced to the the cecum, identified by                            appendiceal orifice and ileocecal valve. The                            colonoscopy was performed without difficulty. The                            patient tolerated the procedure well. The quality                            of the bowel preparation was good. The ileocecal                            valve, appendiceal orifice, and rectum were                            photographed. Scope In: 1:37:50 PM Scope Out: 1:56:20 PM Scope Withdrawal Time: 0 hours 15 minutes 58 seconds  Total Procedure Duration: 0 hours 18 minutes 30 seconds  Findings:                 The perianal and digital rectal examinations were                            normal. Pertinent negatives include normal prostate                            (size, shape, and consistency).                           Two sessile polyps were found in the sigmoid colon.                            The polyps were diminutive in size. These polyps                            were removed with a cold snare. Resection and  retrieval were complete. Verification of patient                            identification for the specimen was done. Estimated                            blood loss was minimal.                           A 2 mm polyp was found in the ascending colon. The                            polyp was sessile. The polyp was removed with a                            cold biopsy forceps. Resection and retrieval were                            complete. Verification of patient identification                            for the specimen was done. Estimated blood loss was                            minimal.                           Multiple diverticula were found in the sigmoid                            colon.                           The exam was otherwise without abnormality on                            direct and retroflexion  views. Complications:            No immediate complications. Estimated Blood Loss:     Estimated blood loss was minimal. Impression:               - Two diminutive polyps in the sigmoid colon,                            removed with a cold snare. Resected and retrieved.                           - One 2 mm polyp in the ascending colon, removed                            with a cold biopsy forceps. Resected and retrieved.                           - Diverticulosis in the sigmoid colon.                           -  The examination was otherwise normal on direct                            and retroflexion views. Recommendation:           - Patient has a contact number available for                            emergencies. The signs and symptoms of potential                            delayed complications were discussed with the                            patient. Return to normal activities tomorrow.                            Written discharge instructions were provided to the                            patient.                           - Continue present medications.                           - Repeat colonoscopy is recommended. The                            colonoscopy date will be determined after pathology                            results from today's exam become available for                            review.                           - Resume previous diet. Gatha Mayer, MD 06/01/2017 2:06:54 PM This report has been signed electronically.

## 2017-06-02 ENCOUNTER — Telehealth: Payer: Self-pay | Admitting: *Deleted

## 2017-06-02 NOTE — Telephone Encounter (Signed)
  Follow up Call-  Call back number 06/01/2017  Post procedure Call Back phone  # [phone]  Permission to leave phone message Yes  Some recent data might be hidden     Patient questions:  Do you have a fever, pain , or abdominal swelling? No. Pain Score  0 *  Have you tolerated food without any problems? Yes.    Have you been able to return to your normal activities? Yes.    Do you have any questions about your discharge instructions: Diet   No. Medications  No. Follow up visit  No.  Do you have questions or concerns about your Care? No.  Actions: * If pain score is 4 or above: No action needed, pain <4.

## 2017-06-03 ENCOUNTER — Ambulatory Visit: Payer: Managed Care, Other (non HMO) | Admitting: Internal Medicine

## 2017-06-04 ENCOUNTER — Telehealth: Payer: Self-pay

## 2017-06-04 NOTE — Telephone Encounter (Signed)
He is long overdue for an appointment. These get him in to see someone as soon as possible

## 2017-06-04 NOTE — Telephone Encounter (Signed)
Pt has an appointment with Ronnald Ramp on 06-09-2017

## 2017-06-05 ENCOUNTER — Telehealth: Payer: Self-pay | Admitting: Internal Medicine

## 2017-06-05 ENCOUNTER — Encounter: Payer: Self-pay | Admitting: Internal Medicine

## 2017-06-05 NOTE — Telephone Encounter (Signed)
Copied from [place] [id number]. Topic: Quick Communication - Rx Refill/Question >> Jun 05, 2017  4:39 PM Percell Belt A wrote: Medication: chlorthalidone (HYGROTON) 25 MG tablet [[id number]]    Has the patient contacted their pharmacy?    (Agent: If no, request that the patient contact the pharmacy for the refill.)   Preferred Pharmacy (with phone number or street name): pt would like a refill on [facility] [address]   Agent: Please be advised that RX refills may take up to 3 business days. We ask that you follow-up with your pharmacy.

## 2017-06-05 NOTE — Telephone Encounter (Signed)
Pt asking for refill of Chlorthalidone. Pt has appt for 06/09/17. See telephone encounter from 06/04/17 regarding refill request.

## 2017-06-05 NOTE — Progress Notes (Signed)
1 adenoma 2 hyperplastic  Recall 2024 My chart lettter

## 2017-06-08 NOTE — Telephone Encounter (Signed)
We will wait for appt tomorrow. Pt has not been taking chlrothalidone.

## 2017-06-09 ENCOUNTER — Encounter: Payer: Managed Care, Other (non HMO) | Admitting: Internal Medicine

## 2017-06-13 ENCOUNTER — Other Ambulatory Visit: Payer: Self-pay | Admitting: Emergency Medicine

## 2017-06-13 MED ORDER — CHLORTHALIDONE 25 MG PO TABS: 25.0000 mg | ORAL_TABLET | Freq: Every day | ORAL | 0 refills | Status: DC

## 2017-06-13 NOTE — Telephone Encounter (Signed)
Pt called very upset stating that he called 2 weeks ago for his refill on his BP med, chlorthalidone. I advised pt that he was supposed to be seen on 06/09/17 and cancelled his appt. He has been without his med for 2 weeks not. I also advised that today is a Saturday clinic and our regular docs are not in the office today and that I could not do anything without [facility] approval. Please contact pt ASAP.

## 2017-06-13 NOTE — Telephone Encounter (Signed)
Spoke to pt and informed that we can send in a very short supply and no more will be sent in if the does not come to his appt on 06/22/2017.

## 2017-06-14 ENCOUNTER — Other Ambulatory Visit: Payer: Self-pay | Admitting: Internal Medicine

## 2017-06-22 ENCOUNTER — Other Ambulatory Visit (INDEPENDENT_AMBULATORY_CARE_PROVIDER_SITE_OTHER): Payer: Managed Care, Other (non HMO)

## 2017-06-22 ENCOUNTER — Ambulatory Visit (INDEPENDENT_AMBULATORY_CARE_PROVIDER_SITE_OTHER): Payer: Managed Care, Other (non HMO) | Admitting: Internal Medicine

## 2017-06-22 ENCOUNTER — Encounter: Payer: Self-pay | Admitting: Internal Medicine

## 2017-06-22 VITALS — BP 150/90 | HR 74 | Temp 98.3°F | Ht 68.0 in | Wt 185.0 lb

## 2017-06-22 DIAGNOSIS — E781 Pure hyperglyceridemia: Secondary | ICD-10-CM

## 2017-06-22 DIAGNOSIS — N181 Chronic kidney disease, stage 1: Secondary | ICD-10-CM

## 2017-06-22 DIAGNOSIS — Z1159 Encounter for screening for other viral diseases: Secondary | ICD-10-CM

## 2017-06-22 DIAGNOSIS — R7989 Other specified abnormal findings of blood chemistry: Secondary | ICD-10-CM

## 2017-06-22 DIAGNOSIS — E118 Type 2 diabetes mellitus with unspecified complications: Secondary | ICD-10-CM | POA: Diagnosis not present

## 2017-06-22 DIAGNOSIS — I5022 Chronic systolic (congestive) heart failure: Secondary | ICD-10-CM

## 2017-06-22 DIAGNOSIS — M1000 Idiopathic gout, unspecified site: Secondary | ICD-10-CM

## 2017-06-22 DIAGNOSIS — Z0000 Encounter for general adult medical examination without abnormal findings: Secondary | ICD-10-CM

## 2017-06-22 DIAGNOSIS — E785 Hyperlipidemia, unspecified: Secondary | ICD-10-CM | POA: Diagnosis not present

## 2017-06-22 DIAGNOSIS — D638 Anemia in other chronic diseases classified elsewhere: Secondary | ICD-10-CM

## 2017-06-22 DIAGNOSIS — I10 Essential (primary) hypertension: Secondary | ICD-10-CM

## 2017-06-22 DIAGNOSIS — Z0001 Encounter for general adult medical examination with abnormal findings: Secondary | ICD-10-CM | POA: Diagnosis not present

## 2017-06-22 DIAGNOSIS — Z794 Long term (current) use of insulin: Secondary | ICD-10-CM

## 2017-06-22 LAB — CBC WITH DIFFERENTIAL/PLATELET
BASOS PCT: 0.3 % (ref 0.0–3.0)
Basophils Absolute: 0 10*3/uL (ref 0.0–0.1)
EOS ABS: 0.1 10*3/uL (ref 0.0–0.7)
Eosinophils Relative: 1.9 % (ref 0.0–5.0)
HEMATOCRIT: 40 % (ref 39.0–52.0)
HEMOGLOBIN: 13.6 g/dL (ref 13.0–17.0)
Lymphocytes Relative: 36.9 % (ref 12.0–46.0)
Lymphs Abs: 1.9 10*3/uL (ref 0.7–4.0)
MCHC: 34.1 g/dL (ref 30.0–36.0)
MCV: 92.8 fl (ref 78.0–100.0)
MONO ABS: 0.3 10*3/uL (ref 0.1–1.0)
Monocytes Relative: 6.2 % (ref 3.0–12.0)
NEUTROS ABS: 2.8 10*3/uL (ref 1.4–7.7)
Neutrophils Relative %: 54.7 % (ref 43.0–77.0)
Platelets: 328 10*3/uL (ref 150.0–400.0)
RBC: 4.3 Mil/uL (ref 4.22–5.81)
RDW: 12.5 % (ref 11.5–15.5)
WBC: 5.2 10*3/uL (ref 4.0–10.5)

## 2017-06-22 LAB — LIPID PANEL
CHOL/HDL RATIO: 4
CHOLESTEROL: 198 mg/dL (ref 0–200)
HDL: 54.4 mg/dL (ref >39.00)
NONHDL: 143.87
TRIGLYCERIDES: 270 mg/dL — AB (ref 0.0–149.0)
VLDL: 54 mg/dL — AB (ref 0.0–40.0)

## 2017-06-22 LAB — COMPREHENSIVE METABOLIC PANEL
ALBUMIN: 4.7 g/dL (ref 3.5–5.2)
ALT: 27 U/L (ref 0–53)
AST: 23 U/L (ref 0–37)
Alkaline Phosphatase: 63 U/L (ref 39–117)
BILIRUBIN TOTAL: 0.8 mg/dL (ref 0.2–1.2)
BUN: 22 mg/dL (ref 6–23)
CO2: 28 mEq/L (ref 19–32)
Calcium: 9.8 mg/dL (ref 8.4–10.5)
Chloride: 99 mEq/L (ref 96–112)
Creatinine, Ser: 1.23 mg/dL (ref 0.40–1.50)
GFR: 78.09 mL/min (ref >60.00)
Glucose, Bld: 148 mg/dL — ABNORMAL HIGH (ref 70–99)
POTASSIUM: 3.4 meq/L — AB (ref 3.5–5.1)
Sodium: 138 mEq/L (ref 135–145)
TOTAL PROTEIN: 7.7 g/dL (ref 6.0–8.3)

## 2017-06-22 LAB — URINALYSIS, ROUTINE W REFLEX MICROSCOPIC
BILIRUBIN URINE: NEGATIVE
Ketones, ur: NEGATIVE
LEUKOCYTES UA: NEGATIVE
NITRITE: NEGATIVE
PH: 6 (ref 5.0–8.0)
Specific Gravity, Urine: 1.015 (ref 1.000–1.030)
URINE GLUCOSE: NEGATIVE
Urobilinogen, UA: 0.2 (ref 0.0–1.0)

## 2017-06-22 LAB — HEMOGLOBIN A1C: Hgb A1c MFr Bld: 6.5 % (ref 4.6–6.5)

## 2017-06-22 LAB — IBC PANEL
Iron: 168 ug/dL — ABNORMAL HIGH (ref 42–165)
Saturation Ratios: 41.2 % (ref 20.0–50.0)
Transferrin: 291 mg/dL (ref 212.0–360.0)

## 2017-06-22 LAB — MICROALBUMIN / CREATININE URINE RATIO
Creatinine,U: 90.4 mg/dL
MICROALB/CREAT RATIO: 13 mg/g (ref 0.0–30.0)
Microalb, Ur: 11.8 mg/dL — ABNORMAL HIGH (ref 0.0–1.9)

## 2017-06-22 LAB — PSA: PSA: 1.65 ng/mL (ref 0.10–4.00)

## 2017-06-22 LAB — VITAMIN B12: Vitamin B-12: 488 pg/mL (ref 211–911)

## 2017-06-22 LAB — FOLATE: FOLATE: 15.8 ng/mL (ref >5.9)

## 2017-06-22 NOTE — Patient Instructions (Signed)

## 2017-06-22 NOTE — Progress Notes (Signed)
Subjective:  Patient ID: Mike Orozco, male    DOB: 10/10/1961  Age: 57 y.o. MRN: [id number]  CC: Annual Exam; Hyperlipidemia; Hypertension; Diabetes; and Anemia   HPI Megan Hayduk presents for a CPX.  His blood pressure has not been well controlled due to noncompliance.  Fortunately, he has had no symptoms related to this.  He does not check his blood sugar but based on his symptoms he thinks his blood sugars have been well controlled.  He denies any recent episodes of polyuria, polydipsia, or polyphagia.  Outpatient Medications Prior to Visit  Medication Sig Dispense Refill  . aspirin EC 81 MG tablet Take 1 tablet (81 mg total) by mouth daily. 30 tablet 11  . carvedilol (COREG) 25 MG tablet Take 1 tablet (25 mg total) by mouth 2 (two) times daily. Must keep Jan 16th appt for future refills. Late refill 15 tablet 0  . cetirizine (ZYRTEC) 10 MG chewable tablet Chew 1 tablet (10 mg total) by mouth daily. 10 tablet 0  . ferrous sulfate 325 (65 FE) MG tablet Take 325 mg by mouth daily with breakfast.    . polyethylene glycol powder (MIRALAX) powder Take 1 Container by mouth once.    Marland Kitchen atorvastatin (LIPITOR) 40 MG tablet Take 1 tablet (40 mg total) by mouth daily. 90 tablet 3  . chlorthalidone (HYGROTON) 25 MG tablet Take 1 tablet (25 mg total) by mouth daily. 15 tablet 0  . 0.9 %  sodium chloride infusion      No facility-administered medications prior to visit.     ROS Review of Systems  Constitutional: Negative for appetite change, diaphoresis, fatigue and unexpected weight change.  HENT: Negative.  Negative for sore throat and trouble swallowing.   Eyes: Negative for visual disturbance.  Respiratory: Negative for cough, chest tightness and wheezing.   Cardiovascular: Negative.  Negative for chest pain, palpitations and leg swelling.  Gastrointestinal: Negative for abdominal pain, blood in stool, constipation, diarrhea and nausea.  Endocrine: Negative for polydipsia, polyphagia and  polyuria.  Genitourinary: Negative.  Negative for difficulty urinating, penile swelling, scrotal swelling, testicular pain and urgency.  Musculoskeletal: Negative for arthralgias and myalgias.  Skin: Negative.  Negative for color change.  Neurological: Negative.  Negative for dizziness, weakness and light-headedness.  Hematological: Negative for adenopathy. Does not bruise/bleed easily.  Psychiatric/Behavioral: Negative.     Objective:  BP (!) 150/90 (BP Location: Left Arm, Patient Position: Sitting, Cuff Size: Large)   Pulse 74   Temp 98.3 F (36.8 C) (Oral)   Ht 5\' 8"  (1.727 m)   Wt 185 lb (83.9 kg)   SpO2 98%   BMI 28.13 kg/m   BP Readings from Last 3 Encounters:  06/22/17 (!) 150/90  06/01/17 (!) 143/76  08/13/16 136/84    Wt Readings from Last 3 Encounters:  06/22/17 185 lb (83.9 kg)  06/01/17 188 lb (85.3 kg)  05/27/17 186 lb (84.4 kg)    Physical Exam  Constitutional: He is oriented to person, place, and time. No distress.  HENT:  Mouth/Throat: Oropharynx is clear and moist. No oropharyngeal exudate.  Eyes: Conjunctivae are normal. Left eye exhibits no discharge. No scleral icterus.  Neck: Normal range of motion. Neck supple. No JVD present. No thyromegaly present.  Cardiovascular: Normal rate, regular rhythm and normal heart sounds. Exam reveals no gallop.  No murmur heard. EKG -  Sinus  Bradycardia  Voltage criteria for LVH  (S(V1)+R(V6) exceeds 3.50 mV)  -Voltage criteria w/o ST/T abnormality may be normal.  BORDERLINE- no change from the prior EKG   Pulmonary/Chest: Effort normal and breath sounds normal. No respiratory distress. He has no wheezes. He has no rales.  Abdominal: Soft. Bowel sounds are normal. He exhibits no distension and no mass. There is no tenderness. There is no guarding. Hernia confirmed negative in the right inguinal area and confirmed negative in the left inguinal area.  Genitourinary: Rectum normal, prostate normal, testes normal and  penis normal. Rectal exam shows no external hemorrhoid, no internal hemorrhoid, no fissure, no mass, no tenderness, anal tone normal and guaiac negative stool. Prostate is not enlarged and not tender. Right testis shows no mass, no swelling and no tenderness. Left testis shows no mass, no swelling and no tenderness. Uncircumcised. No phimosis, hypospadias, penile erythema or penile tenderness. No discharge found.  Musculoskeletal: Normal range of motion. He exhibits no edema, tenderness or deformity.  Lymphadenopathy:    He has no cervical adenopathy.       Right: No inguinal adenopathy present.       Left: No inguinal adenopathy present.  Neurological: He is alert and oriented to person, place, and time.  Skin: Skin is warm and dry. No rash noted. He is not diaphoretic. No erythema. No pallor.  Psychiatric: He has a normal mood and affect. His behavior is normal. Judgment and thought content normal.  Vitals reviewed.   Lab Results  Component Value Date   WBC 5.2 06/22/2017   HGB 13.6 06/22/2017   HCT 40.0 06/22/2017   PLT 328.0 06/22/2017   GLUCOSE 148 (H) 06/22/2017   CHOL 198 06/22/2017   TRIG 270.0 (H) 06/22/2017   HDL 54.40 06/22/2017   LDLDIRECT 84.0 06/22/2017   LDLCALC UNABLE TO CALCULATE IF TRIGLYCERIDE OVER 400 mg/dL 03/23/2013   ALT 27 06/22/2017   AST 23 06/22/2017   NA 138 06/22/2017   K 3.4 (L) 06/22/2017   CL 99 06/22/2017   CREATININE 1.23 06/22/2017   BUN 22 06/22/2017   CO2 28 06/22/2017   TSH 8.55 (H) 06/22/2017   PSA 1.65 06/22/2017   INR 1.01 09/15/2012   HGBA1C 6.5 06/22/2017   MICROALBUR 11.8 (H) 06/22/2017    No results found.  Assessment & Plan:   Kowen was seen today for annual exam, hyperlipidemia, hypertension, diabetes and anemia.  Diagnoses and all orders for this visit:  Type 2 diabetes mellitus with complication, without long-term current use of insulin ([place])- Hs A1c is at 6.5%.  His blood sugars are adequately well controlled.  He has  developed microalbuminuria so have asked him to start taking an ARB. -     Urinalysis, Routine w reflex microscopic; Future -     Hemoglobin A1c; Future -     Microalbumin / creatinine urine ratio; Future -     Ambulatory referral to Ophthalmology -     telmisartan (MICARDIS) 40 MG tablet; Take 1 tablet (40 mg total) by mouth daily.  Essential hypertension, benign- His blood pressure is not adequately well controlled.  Will restart chlorthalidone.  We will also start an ARB in light of the microalbuminuria. -     Urinalysis, Routine w reflex microscopic; Future -     EKG 12-Lead -     chlorthalidone (HYGROTON) 25 MG tablet; Take 1 tablet (25 mg total) by mouth daily. -     telmisartan (MICARDIS) 40 MG tablet; Take 1 tablet (40 mg total) by mouth daily.  Hyperlipidemia with target LDL less than 100- He has not achieved his LDL goal  of 70 and has not been compliant with the statin.  Will restart atorvastatin.  Hypertriglyceridemia- His triglycerides are mildly elevated but do not require medical intervention at this time.  He was encouraged to work on his lifestyle modifications.  Idiopathic gout, unspecified chronicity, unspecified site  Anemia of chronic disease- His H&H are normal now.  I will monitor for vitamin deficiencies. -     CBC with Differential/Platelet; Future -     Vitamin B12; Future -     IBC panel; Future -     Folate; Future -     Vitamin B1; Future  Chronic renal impairment, stage 1- His renal function has improved.  He will avoid nephrotoxic agents. -     Comprehensive metabolic panel; Future  TSH elevation- He has a mildly elevated TSH but his other TFTs are normal and he is asymptomatic so at this time I do not recommend thyroid replacement therapy. -     Thyroid Panel With TSH; Future  Routine general medical examination at a health care facility-exam completed, labs reviewed, vaccines reviewed and updated, screening for colon cancer is up-to-date, patient  education material was given. -     Lipid panel; Future -     PSA; Future -     HIV antibody; Future  Chronic systolic heart failure ([place]) -     Ambulatory referral to Cardiology -     telmisartan (MICARDIS) 40 MG tablet; Take 1 tablet (40 mg total) by mouth daily.  Need for hepatitis C screening test -     Hepatitis C antibody; Future   I am having Jiles Crocker start on telmisartan. I am also having him maintain his ferrous sulfate, cetirizine, aspirin EC, carvedilol, MIRALAX, chlorthalidone, and atorvastatin. We will stop administering sodium chloride.  Meds ordered this encounter  Medications  . chlorthalidone (HYGROTON) 25 MG tablet    Sig: Take 1 tablet (25 mg total) by mouth daily.    Dispense:  90 tablet    Refill:  1  . telmisartan (MICARDIS) 40 MG tablet    Sig: Take 1 tablet (40 mg total) by mouth daily.    Dispense:  90 tablet    Refill:  1  . atorvastatin (LIPITOR) 40 MG tablet    Sig: Take 1 tablet (40 mg total) by mouth daily.    Dispense:  90 tablet    Refill:  1     Follow-up: Return in about 4 months (around 10/20/2017).  Scarlette Calico, MD

## 2017-06-23 LAB — LDL CHOLESTEROL, DIRECT: Direct LDL: 84 mg/dL

## 2017-06-23 MED ORDER — ATORVASTATIN CALCIUM 40 MG PO TABS: 40.0000 mg | ORAL_TABLET | Freq: Every day | ORAL | 1 refills | Status: DC

## 2017-06-23 MED ORDER — CHLORTHALIDONE 25 MG PO TABS: 25.0000 mg | ORAL_TABLET | Freq: Every day | ORAL | 1 refills | Status: DC

## 2017-06-23 MED ORDER — TELMISARTAN 40 MG PO TABS: 40.0000 mg | ORAL_TABLET | Freq: Every day | ORAL | 1 refills | Status: DC

## 2017-06-26 ENCOUNTER — Encounter: Payer: Self-pay | Admitting: Internal Medicine

## 2017-06-26 LAB — THYROID PANEL WITH TSH
Free Thyroxine Index: 2.4 (ref 1.4–3.8)
T3 UPTAKE: 31 % (ref 22–35)
T4, Total: 7.7 ug/dL (ref 4.9–10.5)
TSH: 8.55 m[IU]/L — AB (ref 0.40–4.50)

## 2017-06-26 LAB — VITAMIN B1: Vitamin B1 (Thiamine): 9 nmol/L (ref 8–30)

## 2017-06-26 LAB — HEPATITIS C ANTIBODY
Hepatitis C Ab: NONREACTIVE
SIGNAL TO CUT-OFF: 0.02 (ref <1.00)

## 2017-06-26 LAB — HIV ANTIBODY (ROUTINE TESTING W REFLEX): HIV 1&2 Ab, 4th Generation: NONREACTIVE

## 2017-06-29 ENCOUNTER — Other Ambulatory Visit: Payer: Self-pay | Admitting: Internal Medicine

## 2017-06-29 ENCOUNTER — Encounter: Payer: Self-pay | Admitting: Internal Medicine

## 2017-06-29 DIAGNOSIS — E519 Thiamine deficiency, unspecified: Secondary | ICD-10-CM | POA: Insufficient documentation

## 2017-06-29 DIAGNOSIS — R3129 Other microscopic hematuria: Secondary | ICD-10-CM

## 2017-06-29 MED ORDER — VITAMIN B-1 50 MG PO TABS: 50.0000 mg | ORAL_TABLET | Freq: Every day | ORAL | 1 refills | Status: DC

## 2017-07-03 ENCOUNTER — Encounter: Payer: Self-pay | Admitting: Family Medicine

## 2017-07-03 ENCOUNTER — Ambulatory Visit (INDEPENDENT_AMBULATORY_CARE_PROVIDER_SITE_OTHER): Payer: Managed Care, Other (non HMO) | Admitting: Family Medicine

## 2017-07-03 DIAGNOSIS — M79671 Pain in right foot: Secondary | ICD-10-CM | POA: Diagnosis not present

## 2017-07-03 NOTE — Patient Instructions (Addendum)
Please continue icing the foot and taking anti-inflammatory Please try to wear the postop shoe for about a week and then wean yourself out of it. Please follow-up with me in 3-4 weeks if you feel like her symptoms have not improved.

## 2017-07-03 NOTE — Assessment & Plan Note (Addendum)
Appears that his midfoot has been sprained. Not likely a fracture based on ultrasound scan looking reimage if no improvement. - Placed in a postop shoe today. Counseled on how long to where it went away now - Counseled on ice and continuing naproxen - Advised to follow-up in 2-3 weeks if no improvement. If still having pain could reimage and ultrasound again.

## 2017-07-03 NOTE — Progress Notes (Signed)
Mike Orozco - 57 y.o. male MRN [id number]  Date of birth: 10/10/1961  SUBJECTIVE:  Including CC & ROS.  Chief Complaint  Patient presents with  . Right foot sprain    Mike Orozco is a 57 y.o. male that is presenting with right foot pain. Pain has been ongoing for one week. He was stepped on a a pinecone and rolled ankle. He was seen at ED in [facility], prescribed Napoxen and ice therapy with no improvement. Pain is located on the lateral side of his midfoot. Pain is constant, worse when walking. Swelling and tenderness still present. He works at [employer], has been unable to work due to the pain.   Review of the x-ray from 2/12 shows no acute fractures.   Review of Systems  Constitutional: Negative for fever.  HENT: Negative for sinus pain.   Respiratory: Negative for cough.   Cardiovascular: Negative for chest pain.  Gastrointestinal: Negative for abdominal pain.  Musculoskeletal: Positive for gait problem.  Neurological: Negative for weakness.  Hematological: Negative for adenopathy.  Psychiatric/Behavioral: Negative for agitation.    HISTORY: Past Medical, Surgical, Social, and Family History Reviewed & Updated per EMR.   Pertinent Historical Findings include:  Past Medical History:  Diagnosis Date  . Anemia   . Arthritis   . CHF (congestive heart failure) ([place])   . Chronic renal insufficiency baseline CRE  1.5--1.8   NEPHROLOGIST--  DR WEBB ([facility])  . Diabetes mellitus type 2, insulin dependent ([facility]) PER pt and LAST PCP NOTE CBG'S IN 100'   RECENTLY UNCONTROLLED AIC 07-07-2012  9.4 AND CBG'S 400'S  . GERD (gastroesophageal reflux disease)   . Gout, unspecified    stable  per pt  . Heart block AV first degree   . Heart failure, chronic systolic ([place])   . History of ETOH abuse   . Hypertension    CARDIOLOGIST-  DR Stanford Breed--- LOV IN [facility] 09-08-2011  . Nephrolithiasis    RIGHT  . Non-ischemic cardiomyopathy ([place]) SECONDARY TO HX UNCONTROLLED HTN  AND ETOH ABUSE----  LAST ECHO EF 50%  09-22-2011   a) Echo 03/25/2010: EF 20-25%; mild AI; Mod MR; Mild LAE  b)cardiac cath 03/26/2010: normal cors; severe pulmonary HTN;PCWP 41; EF 10-15%;  c. echo 4/12:  EF 45-50%, mild LVH, grade 1 diast dysfxn, mild AI, mild LAE  . Personal history of colonic polyps-adenoma 02/17/2012   01/2012 - diminutive adenoma Repeat colon about 01/2017    . Polycystic kidney disease    BILATERAL RENAL CYST  . Pure hyperglyceridemia     Past Surgical History:  Procedure Laterality Date  . CARDIAC CATHETERIZATION  03-26-2010  DR [place]   SEVERE LV DYSFUNCTION WITH MARKEDLY ELEVATED FILLING PRESSURE/ SEVERE PULMONARY HYPERTENSION/ NORMAL CORONARY ANATOMY  . colonoscopy    . COLONOSCOPY  2013  . CYSTOSCOPY WITH RETROGRADE PYELOGRAM, URETEROSCOPY AND STENT PLACEMENT Right 08/18/2012   Procedure: CYSTOSCOPY WITH RETROGRADE PYELOGRAM, URETEROSCOPY AND STENT PLACEMENT;  Surgeon: Molli Hazard, MD;  Location: [facility];  Service: Urology;  Laterality: Right;  . KIDNEY STONE SURGERY     X 2  . KNEE ARTHROSCOPY W/ MENISCECTOMY Left 02-06-2011   AND CHONDROPLASTY  . LUMBAR FUSION  08-10-2003   L5  --  S1  . NEPHROLITHOTOMY Right 09/15/2012   Procedure: NEPHROLITHOTOMY PERCUTANEOUS;  Surgeon: Molli Hazard, MD;  Location: [facility];  Service: Urology;  Laterality: Right;  RIGHT PERCUTANEOUS NEPHROSTOLITHOTOMY   . REMOVAL CYST LEFT LUNG  1994  AT [facility]   benign  . RHINOPLASTY  1970'S   nose fx  . TRANSTHORACIC ECHOCARDIOGRAM  09-22-2011  DR CRENSHAW   EF 50%/  MILD LVH/ GRADE I DIASTOLIC DYSFUNCTION/ MILD TR    Allergies  Allergen Reactions  . Lisinopril Hives  . Metformin     Renal issues  . Metformin And Related     Renal issues    Family History  Problem Relation Age of Onset  . Diabetes Mother   . Hypertension Mother   . Diabetes Father   . Hypertension Father   . Colon cancer Paternal Grandfather      Social History    Socioeconomic History  . Marital status: Married    Spouse name: Not on file  . Number of children: 1  . Years of education: Not on file  . Highest education level: Not on file  Social Needs  . Financial resource strain: Not on file  . Food insecurity - worry: Not on file  . Food insecurity - inability: Not on file  . Transportation needs - medical: Not on file  . Transportation needs - non-medical: Not on file  Occupational History  . Occupation: [employer]    Employer: [employer]  Tobacco Use  . Smoking status: Current Every Day Smoker    Packs/day: 0.00    Years: 32.00    Pack years: 0.00    Types: Cigars    Last attempt to quit: 02/27/2007    Years since quitting: 10.3  . Smokeless tobacco: Never Used  Substance and Sexual Activity  . Alcohol use: Yes    Alcohol/week: 8.4 oz    Types: 14 Cans of beer per week    Comment: HX ALCOHOL ABUSE--  pt states he drinks 2 beers a day  . Drug use: No  . Sexual activity: Not on file  Other Topics Concern  . Not on file  Social History Narrative   No regular exercise   Daily caffeine        PHYSICAL EXAM:  VS: BP 138/76 (BP Location: Left Arm, Patient Position: Sitting, Cuff Size: Normal)   Pulse 70   Temp 97.7 F (36.5 C) (Oral)   Ht 5\' 8"  (1.727 m)   Wt 180 lb (81.6 kg)   SpO2 97%   BMI 27.37 kg/m  Physical Exam Gen: NAD, alert, cooperative with exam, well-appearing ENT: normal lips, normal nasal mucosa,  Eye: normal EOM, normal conjunctiva and lids CV:  no edema, +2 pedal pulses   Resp: no accessory muscle use, non-labored,  GI: no masses or tenderness, no hernia  Skin: no rashes, no areas of induration  Neuro: normal tone, normal sensation to touch Psych:  normal insight, alert and oriented MSK:  Right foot: Swelling occurring in the midfoot Tenderness to palpation over the fourth base and cuboid No tenderness to palpation of the lateral malleolus, malleolus, navicular,  or the base of the metatarsal Normal ankle range of motion. No tenderness over the ATFL. Normal ankle range of motion Neurovascularly intact  Limited ultrasound: Right foot:  There appears to be swelling between the base of the fourth metatarsal and cuboid. Normal insertion of the peroneal brevis since the base of the fifth metatarsal  Summary: Appears to have a midfoot sprain between the fourth metatarsal and the keloid  Ultrasound and interpretation by Clearance Coots, MD           ASSESSMENT & PLAN:   Right foot  pain Appears that his midfoot has been sprained. Not likely a fracture based on ultrasound scan looking reimage if no improvement. - Placed in a postop shoe today. Counseled on how long to where it went away now - Counseled on ice and continuing naproxen - Advised to follow-up in 2-3 weeks if no improvement. If still having pain could reimage and ultrasound again.

## 2017-07-06 ENCOUNTER — Telehealth: Payer: Self-pay | Admitting: Internal Medicine

## 2017-07-06 NOTE — Telephone Encounter (Signed)
FYI- Pt declined cardiology and he said he will call next month to schedule his own appointment with Dr. Stanford Breed

## 2017-07-07 ENCOUNTER — Ambulatory Visit: Payer: Managed Care, Other (non HMO) | Admitting: Cardiology

## 2017-08-13 ENCOUNTER — Other Ambulatory Visit: Payer: Self-pay | Admitting: Internal Medicine

## 2017-08-13 DIAGNOSIS — I10 Essential (primary) hypertension: Secondary | ICD-10-CM

## 2017-08-13 MED ORDER — CARVEDILOL 25 MG PO TABS: 25.0000 mg | ORAL_TABLET | Freq: Two times a day (BID) | ORAL | 1 refills | Status: DC

## 2017-12-22 ENCOUNTER — Encounter: Payer: Self-pay | Admitting: Family

## 2017-12-22 ENCOUNTER — Ambulatory Visit (INDEPENDENT_AMBULATORY_CARE_PROVIDER_SITE_OTHER): Payer: Managed Care, Other (non HMO) | Admitting: Family

## 2017-12-22 VITALS — BP 126/74 | HR 62 | Temp 98.0°F | Ht 68.0 in | Wt 178.1 lb

## 2017-12-22 DIAGNOSIS — N281 Cyst of kidney, acquired: Secondary | ICD-10-CM | POA: Diagnosis not present

## 2017-12-22 MED ORDER — TRAMADOL HCL 50 MG PO TABS: 50.0000 mg | ORAL_TABLET | Freq: Two times a day (BID) | ORAL | 0 refills | Status: DC | PRN

## 2017-12-22 NOTE — Progress Notes (Signed)
Mike Orozco is a 57 y.o. male with the following history as recorded in EpicCare:  Patient Active Problem List   Diagnosis Date Noted  . Right foot pain 07/03/2017  . Thiamine deficiency 06/29/2017  . Hematuria, microscopic 06/29/2017  . Need for hepatitis C screening test 06/22/2017  . Hyperlipidemia with target LDL less than 100 11/13/2015  . TSH elevation 11/13/2015  . Routine general medical examination at a health care facility 11/13/2015  . Hypertriglyceridemia 03/22/2013  . Ventral hernia 03/21/2013  . Type II diabetes mellitus with manifestations ([place]) 07/12/2012  . Personal history of colonic polyps-adenoma 02/17/2012  . Chronic renal insufficiency 10/27/2011  . Gout 05/28/2011  . Anemia of chronic disease 12/16/2010  . Kidney stones 12/16/2010  . Low back pain 08/19/2010  . Allergic rhinitis due to other allergen 06/17/2010  . CARDIOMYOPATHY 04/03/2010  . Chronic systolic heart failure ([place]) 04/03/2010  . Essential hypertension, benign 04/02/2010    Current Outpatient Medications  Medication Sig Dispense Refill  . aspirin EC 81 MG tablet Take 1 tablet (81 mg total) by mouth daily. 30 tablet 11  . atorvastatin (LIPITOR) 40 MG tablet Take 1 tablet (40 mg total) by mouth daily. 90 tablet 1  . carvedilol (COREG) 25 MG tablet Take 1 tablet (25 mg total) by mouth 2 (two) times daily with a meal. 180 tablet 1  . cetirizine (ZYRTEC) 10 MG chewable tablet Chew 1 tablet (10 mg total) by mouth daily. 10 tablet 0  . chlorthalidone (HYGROTON) 25 MG tablet Take 1 tablet (25 mg total) by mouth daily. 90 tablet 1  . ferrous sulfate 325 (65 FE) MG tablet Take 325 mg by mouth daily with breakfast.    . HYDROcodone-acetaminophen (NORCO/VICODIN) 5-325 MG tablet Take by mouth.    . naproxen (NAPROSYN) 500 MG tablet Take by mouth.    . polyethylene glycol powder (MIRALAX) powder Take 1 Container by mouth once.    Marland Kitchen telmisartan (MICARDIS) 40 MG tablet Take 1 tablet (40 mg total) by mouth  daily. 90 tablet 1  . thiamine (VITAMIN B-1) 50 MG tablet Take 1 tablet (50 mg total) by mouth daily. 90 tablet 1  . traMADol (ULTRAM) 50 MG tablet Take 1 tablet (50 mg total) by mouth every 12 (twelve) hours as needed for moderate pain. 30 tablet 0   No current facility-administered medications for this visit.     Allergies: Lisinopril; Metformin; and Metformin and related  Past Medical History:  Diagnosis Date  . Anemia   . Arthritis   . CHF (congestive heart failure) ([place])   . Chronic renal insufficiency baseline CRE  1.5--1.8   NEPHROLOGIST--  DR WEBB ([facility])  . Diabetes mellitus type 2, insulin dependent (HCC) PER pt and LAST PCP NOTE CBG'S IN 100'   RECENTLY UNCONTROLLED AIC 07-07-2012  9.4 AND CBG'S 400'S  . GERD (gastroesophageal reflux disease)   . Gout, unspecified    stable  per pt  . Heart block AV first degree   . Heart failure, chronic systolic ([place])   . History of ETOH abuse   . Hypertension    CARDIOLOGIST-  DR Stanford Breed--- LOV IN [facility] 09-08-2011  . Nephrolithiasis    RIGHT  . Non-ischemic cardiomyopathy ([place]) SECONDARY TO HX UNCONTROLLED HTN AND ETOH ABUSE----  LAST ECHO EF 50%  09-22-2011   a) Echo 03/25/2010: EF 20-25%; mild AI; Mod MR; Mild LAE  b)cardiac cath 03/26/2010: normal cors; severe pulmonary HTN;PCWP 41; EF 10-15%;  c. echo 4/12:  EF 45-50%,  mild LVH, grade 1 diast dysfxn, mild AI, mild LAE  . Personal history of colonic polyps-adenoma 02/17/2012   01/2012 - diminutive adenoma Repeat colon about 01/2017    . Polycystic kidney disease    BILATERAL RENAL CYST  . Pure hyperglyceridemia     Past Surgical History:  Procedure Laterality Date  . CARDIAC CATHETERIZATION  03-26-2010  DR Martinique   SEVERE LV DYSFUNCTION WITH MARKEDLY ELEVATED FILLING PRESSURE/ SEVERE PULMONARY HYPERTENSION/ NORMAL CORONARY ANATOMY  . colonoscopy    . COLONOSCOPY  2013  . CYSTOSCOPY WITH RETROGRADE PYELOGRAM, URETEROSCOPY AND STENT PLACEMENT Right 08/18/2012   Procedure:  CYSTOSCOPY WITH RETROGRADE PYELOGRAM, URETEROSCOPY AND STENT PLACEMENT;  Surgeon: Molli Hazard, MD;  Location: [facility];  Service: Urology;  Laterality: Right;  . KIDNEY STONE SURGERY     X 2  . KNEE ARTHROSCOPY W/ MENISCECTOMY Left 02-06-2011   AND CHONDROPLASTY  . LUMBAR FUSION  08-10-2003   L5  --  S1  . NEPHROLITHOTOMY Right 09/15/2012   Procedure: NEPHROLITHOTOMY PERCUTANEOUS;  Surgeon: Molli Hazard, MD;  Location: [facility];  Service: Urology;  Laterality: Right;  RIGHT PERCUTANEOUS NEPHROSTOLITHOTOMY   . REMOVAL CYST LEFT LUNG  1994   AT [facility]   benign  . RHINOPLASTY  1970'S   nose fx  . TRANSTHORACIC ECHOCARDIOGRAM  09-22-2011  DR CRENSHAW   EF 50%/  MILD LVH/ GRADE I DIASTOLIC DYSFUNCTION/ MILD TR    Family History  Problem Relation Age of Onset  . Diabetes Mother   . Hypertension Mother   . Diabetes Father   . Hypertension Father   . Colon cancer Paternal Grandfather     Social History   Tobacco Use  . Smoking status: Current Every Day Smoker    Packs/day: 0.00    Years: 32.00    Pack years: 0.00    Types: Cigars    Last attempt to quit: 02/27/2007    Years since quitting: 10.8  . Smokeless tobacco: Never Used  Substance Use Topics  . Alcohol use: Yes    Alcohol/week: 8.4 oz    Types: 14 Cans of beer per week    Comment: HX ALCOHOL ABUSE--  pt states he drinks 2 beers a day    Subjective:  Patient was seen at the ER yesterday with concerns for severe right sided flank pain. Had a CT done there which showed large right sided renal cyst (8 cm) and non-obstructing kidney stone on left.  Patient was told he needed to follow-up with his nephrologist- scheduled to be seen there next Wednesday; is concerned about the severity of the persisting pain- asking to have his work note extended through at least tomorrow; labs were done at the ER yesterday and had a normal creatinine level.   Objective:  Vitals:   12/22/17 1341  BP: 126/74   Pulse: 62  Temp: 98 F (36.7 C)  TempSrc: Oral  SpO2: 95%  Weight: 178 lb 1.9 oz (80.8 kg)  Height: 5\' 8"  (1.727 m)    General: Well developed, well nourished, in no acute distress  Skin : Warm and dry.  Head: Normocephalic and atraumatic  Lungs: Respirations unlabored;  Musculoskeletal: No deformities; no active joint inflammation; right flank tender to palpation  Extremities: No edema, cyanosis, clubbing  Vessels: Symmetric bilaterally  Neurologic: Alert and oriented; speech intact; face symmetrical; moves all extremities well; CNII-XII intact without focal deficit  Assessment:  1. Renal cyst     Plan:  In reviewing CT  results from ER, radiologist is concerned about specific area in the right kidney- recommending U/S be done to rule out mass/ neoplasm. Update order for renal ultrasound; stressed need to keep follow-up with his nephrologist; will give short-term prescription with Tramadol to help with pain; follow-up to be determined.   No follow-ups on file.  Orders Placed This Encounter  Procedures  . US Renal    Standing Status:   Future    Standing Expiration Date:   02/22/2019    Order Specific Question:   Reason for Exam (SYMPTOM  OR DIAGNOSIS REQUIRED)    Answer:   large renal cyst    Order Specific Question:   Preferred imaging location?    Answer:   [facility]    Requested Prescriptions   Signed Prescriptions Disp Refills  . traMADol (ULTRAM) 50 MG tablet 30 tablet 0    Sig: Take 1 tablet (50 mg total) by mouth every 12 (twelve) hours as needed for moderate pain.

## 2017-12-22 NOTE — Patient Instructions (Signed)
8 cm cyst on upper right pole of kidney- please see if Dr. Justin Mend can see you before next Wednesday

## 2017-12-23 ENCOUNTER — Telehealth: Payer: Self-pay | Admitting: Internal Medicine

## 2017-12-23 DIAGNOSIS — E118 Type 2 diabetes mellitus with unspecified complications: Secondary | ICD-10-CM

## 2017-12-23 DIAGNOSIS — E519 Thiamine deficiency, unspecified: Secondary | ICD-10-CM

## 2017-12-23 DIAGNOSIS — Z794 Long term (current) use of insulin: Secondary | ICD-10-CM

## 2017-12-23 DIAGNOSIS — I10 Essential (primary) hypertension: Secondary | ICD-10-CM

## 2017-12-23 NOTE — Telephone Encounter (Signed)
Copied from [place] [id number]. Topic: General - Other >> Dec 23, 2017  4:11 PM Keene Breath wrote: Reason for CRM: Patient called to have note faxed over to his employer at [phone], stating that he is able to go back to work.  Patient said he could not find the note that was given to him at his visit but he needs this note as soon as possible.  Patient would like a call back from the nurse or doctor once this is taken care of.  CB# [phone].

## 2017-12-23 NOTE — Telephone Encounter (Signed)
Copied from [place] [phone]. Topic: Quick Communication - Rx Refill/Question >> Dec 23, 2017  9:33 AM Burchel, Abbi R wrote: Medication: telmisartan (MICARDIS) 40 MG tablet, chlorthalidone (HYGROTON) 25 MG tablet   Pt is unclear if he should take both of these medications, or if he is to switch from Chlorthalidone to Telmisartan.  Please call pt to advise.   Pt: [phone]

## 2017-12-23 NOTE — Telephone Encounter (Signed)
Letter has been faxed as requeseted.

## 2017-12-23 NOTE — Telephone Encounter (Signed)
Call to patient- patient reports he has only been taking one of the BP medications from February visit. He is taking the chlorthalidone 25 mg daily. His BP seems  normal as taken in office yesterday at acute visit. Patient is going to need a refill on the medication. He does not want to take the telmisartan 40 mg if he does not need to- please let him know. May leave message on VM- per patient  Current medications: [place], Coreg, ASA, Vit B Verified with patient today  Reminded patient he is overdue follow up with PCP for BP- he is awaiting scheduling for [place] and urology for possible cyst

## 2017-12-24 MED ORDER — VITAMIN B-1 50 MG PO TABS: 50.0000 mg | ORAL_TABLET | Freq: Every day | ORAL | 0 refills | Status: DC

## 2017-12-24 MED ORDER — CARVEDILOL 25 MG PO TABS: 25.0000 mg | ORAL_TABLET | Freq: Two times a day (BID) | ORAL | 0 refills | Status: DC

## 2017-12-24 MED ORDER — CHLORTHALIDONE 25 MG PO TABS: 25.0000 mg | ORAL_TABLET | Freq: Every day | ORAL | 0 refills | Status: DC

## 2017-12-24 MED ORDER — ASPIRIN EC 81 MG PO TBEC: 81.0000 mg | DELAYED_RELEASE_TABLET | Freq: Every day | ORAL | 0 refills | Status: AC

## 2017-12-24 NOTE — Telephone Encounter (Signed)
I can send in a 30 day supply.

## 2017-12-25 ENCOUNTER — Other Ambulatory Visit: Payer: Self-pay | Admitting: Family

## 2017-12-25 NOTE — Telephone Encounter (Addendum)
Patient is calling to see if he can get a note for his part time job as well. He would like it to say that he can not work today. He said he is in a lot of pain and he is hurting. Call when that is ready for pickup [phone], patient will be in the area today.

## 2017-12-25 NOTE — Telephone Encounter (Addendum)
I will have to send rq to Mickel Baas.

## 2017-12-25 NOTE — Telephone Encounter (Signed)
I put on your desk

## 2017-12-25 NOTE — Telephone Encounter (Signed)
Pt states he only takes half of his Vitamin pill. Appt made

## 2017-12-28 ENCOUNTER — Telehealth: Payer: Self-pay | Admitting: Family

## 2017-12-28 ENCOUNTER — Other Ambulatory Visit: Payer: Self-pay | Admitting: Family

## 2017-12-28 NOTE — Telephone Encounter (Signed)
Pt called upset stating that he never got the note that he requested. Pt stated that he missed work on Friday 8/9 and left early today 8/12 due to pain. Pt would like a call back when note is available for pick up. Please advise.

## 2017-12-28 NOTE — Telephone Encounter (Signed)
I am putting requested work notes on your desk. He needs to keep the follow-up with his nephrologist. If for whatever reason, he wants extended time off work, he will need to see Dr. Ronnald Ramp.

## 2017-12-28 NOTE — Telephone Encounter (Signed)
Spoke with patient and info given. He has been made aware that for any more notes or extension of dates he would need to come in and see Dr. Ronnald Ramp. Patient voiced understanding and is fine with this. He has appointment to see Dr. Justin Mend tomorrow for appointment as they called him today with a cancellation appointment so he did not need work note for tomorrow's date.

## 2017-12-29 ENCOUNTER — Ambulatory Visit: Payer: Self-pay

## 2017-12-29 ENCOUNTER — Emergency Department (HOSPITAL_COMMUNITY): Payer: Managed Care, Other (non HMO)

## 2017-12-29 ENCOUNTER — Encounter (HOSPITAL_COMMUNITY): Payer: Self-pay | Admitting: *Deleted

## 2017-12-29 ENCOUNTER — Emergency Department (HOSPITAL_COMMUNITY)
Admission: EM | Admit: 2017-12-29 | Discharge: 2017-12-29 | Disposition: A | Discharge status: Home / Self Care | Payer: Managed Care, Other (non HMO) | Attending: Emergency Medicine | Admitting: Emergency Medicine

## 2017-12-29 DIAGNOSIS — M549 Dorsalgia, unspecified: Secondary | ICD-10-CM | POA: Diagnosis present

## 2017-12-29 DIAGNOSIS — Z7982 Long term (current) use of aspirin: Secondary | ICD-10-CM | POA: Insufficient documentation

## 2017-12-29 DIAGNOSIS — Z79899 Other long term (current) drug therapy: Secondary | ICD-10-CM | POA: Insufficient documentation

## 2017-12-29 DIAGNOSIS — R1031 Right lower quadrant pain: Secondary | ICD-10-CM | POA: Insufficient documentation

## 2017-12-29 DIAGNOSIS — I110 Hypertensive heart disease with heart failure: Secondary | ICD-10-CM | POA: Diagnosis not present

## 2017-12-29 DIAGNOSIS — I5022 Chronic systolic (congestive) heart failure: Secondary | ICD-10-CM | POA: Diagnosis not present

## 2017-12-29 DIAGNOSIS — F17290 Nicotine dependence, other tobacco product, uncomplicated: Secondary | ICD-10-CM | POA: Insufficient documentation

## 2017-12-29 DIAGNOSIS — R109 Unspecified abdominal pain: Secondary | ICD-10-CM

## 2017-12-29 DIAGNOSIS — F1010 Alcohol abuse, uncomplicated: Secondary | ICD-10-CM | POA: Diagnosis not present

## 2017-12-29 MED ORDER — NAPROXEN 500 MG PO TABS: 500.0000 mg | ORAL_TABLET | Freq: Two times a day (BID) | ORAL | 0 refills | Status: AC

## 2017-12-29 MED ORDER — METHOCARBAMOL 500 MG PO TABS: 500.0000 mg | ORAL_TABLET | Freq: Two times a day (BID) | ORAL | 0 refills | Status: AC

## 2017-12-29 MED ORDER — METHOCARBAMOL 500 MG PO TABS
750.0000 mg | ORAL_TABLET | Freq: Once | ORAL | Status: AC
  Administered 2017-12-29: 750 mg via ORAL
  Filled 2017-12-29: qty 2

## 2017-12-29 NOTE — ED Provider Notes (Signed)
The patient is a 57 year old male, known history of polycystic kidney disease according to the patient's report, also recently started having some right lower back and flank pain, he was worked up at another hospital with a CT scan looking for kidney stones which showed no kidney stones but commented on potential abnormality of the kidney which could be a renal cell carcinoma.  The patient saw his nephrologist today Dr. Justin Mend, again reinforced that this was not likely related to his kidney, the patient is requesting further evaluation.  On my exam he does have some minimal tenderness in that right lower back but seems to be otherwise well able to stand and walk and has no abnormal gait.  No numbness or weakness of the legs.  The patient will need a renal ultrasound to further evaluate his kidneys but otherwise can be discharged home with an anti-inflammatory and a muscle relaxer.  Blood work from February reviewed and showed renal function should be able to tolerate 1 to 2 weeks of anti-inflammatories.  Patient agreeable.  Medical screening examination/treatment/procedure(s) were conducted as a shared visit with non-physician practitioner(s) and myself.  I personally evaluated the patient during the encounter.  Clinical Impression:   Final diagnoses:  Right flank pain         Noemi Chapel, MD 12/31/17 1013

## 2017-12-29 NOTE — Discharge Instructions (Signed)
I have prescribed muscle relaxers, please do not drink or drive as it can make you drowsy.Please follow up with PCP within 1 week for reevaluation of symptoms.

## 2017-12-29 NOTE — Telephone Encounter (Signed)
Please advise on work in or what would you suggest.

## 2017-12-29 NOTE — Telephone Encounter (Signed)
Pt saw Dr [employer] today and Dr [employer] told the pt his pain is coming from his back.   Pt does have a cyst, but the pain is not coming from that per the doctor. Dr Justin Mend did take pt out of work today and tomorrow. Pt states he is in severe pain and really needs to see Dr Ronnald Ramp asap.   I have transferred the call to my triage nurse

## 2017-12-29 NOTE — ED Triage Notes (Signed)
Pt in c/o right flank pain, states he has been to his PCP, [facility] ED, and his nephrologist, according to his nephrologist everything is fine with his kidney and he believes this pain is musculoskeletal, the pain is intermittent, worse with movement and has spasms

## 2017-12-29 NOTE — Telephone Encounter (Addendum)
Patient called in with c/o "back pain." He says "I just left my kidney doctor and he said the pain is not my kidneys. I am lying here in the back seat and can hardly walk because of the pain. I just saw Mickel Baas and she told me it was my kidneys and prescribed pain medicine. I take it, but it doesn't help. My back feels like spasms. I don't know what else to do at this point. I am on my way to the emergency room." I advised I can triage him and schedule an appointment with another provider, he says "I'm almost at the emergency room. I'll just go there."

## 2017-12-29 NOTE — ED Provider Notes (Signed)
[facility] EMERGENCY DEPARTMENT Provider Note   CSN: [id number] Arrival date & time: 12/29/17  1151     History   Chief Complaint Chief Complaint  Patient presents with  . Back Pain    HPI Mike Orozco is a 57 y.o. male.  57 y/o male with a PMH of DM,HTN presents to the ED with a chief complaint of back pain x 1 week. Patient states he was seen at [facility] ED were a CT was obtained which revealed a kidney cyst. Patient had his appointment with Dr. Senaida Lange today who told him that he was not having a kidney problem but a msk issue. He describes the pain as spams worse with walking and better with sitting. He was prescribed tramadol by his PCP and states some relieve in symptoms. He denies any urinary symptoms, fever, abdominal pain, chest pain or Shortness of breath.      Past Medical History:  Diagnosis Date  . Anemia   . Arthritis   . CHF (congestive heart failure) ([place])   . Chronic renal insufficiency baseline CRE  1.5--1.8   NEPHROLOGIST--  DR WEBB ([facility])  . Diabetes mellitus type 2, insulin dependent (HCC) PER pt and LAST PCP NOTE CBG'S IN 100'   RECENTLY UNCONTROLLED AIC 07-07-2012  9.4 AND CBG'S 400'S  . GERD (gastroesophageal reflux disease)   . Gout, unspecified    stable  per pt  . Heart block AV first degree   . Heart failure, chronic systolic ([place])   . History of ETOH abuse   . Hypertension    CARDIOLOGIST-  DR Stanford Breed--- LOV IN [facility] 09-08-2011  . Nephrolithiasis    RIGHT  . Non-ischemic cardiomyopathy ([place]) SECONDARY TO HX UNCONTROLLED HTN AND ETOH ABUSE----  LAST ECHO EF 50%  09-22-2011   a) Echo 03/25/2010: EF 20-25%; mild AI; Mod MR; Mild LAE  b)cardiac cath 03/26/2010: normal cors; severe pulmonary HTN;PCWP 41; EF 10-15%;  c. echo 4/12:  EF 45-50%, mild LVH, grade 1 diast dysfxn, mild AI, mild LAE  . Personal history of colonic polyps-adenoma 02/17/2012   01/2012 - diminutive adenoma Repeat colon about 01/2017    . Polycystic  kidney disease    BILATERAL RENAL CYST  . Pure hyperglyceridemia     Patient Active Problem List   Diagnosis Date Noted  . Right foot pain 07/03/2017  . Thiamine deficiency 06/29/2017  . Hematuria, microscopic 06/29/2017  . Need for hepatitis C screening test 06/22/2017  . Hyperlipidemia with target LDL less than 100 11/13/2015  . TSH elevation 11/13/2015  . Routine general medical examination at a health care facility 11/13/2015  . Hypertriglyceridemia 03/22/2013  . Ventral hernia 03/21/2013  . Type II diabetes mellitus with manifestations ([place]) 07/12/2012  . Personal history of colonic polyps-adenoma 02/17/2012  . Chronic renal insufficiency 10/27/2011  . Gout 05/28/2011  . Anemia of chronic disease 12/16/2010  . Kidney stones 12/16/2010  . Low back pain 08/19/2010  . Allergic rhinitis due to other allergen 06/17/2010  . CARDIOMYOPATHY 04/03/2010  . Chronic systolic heart failure ([place]) 04/03/2010  . Essential hypertension, benign 04/02/2010    Past Surgical History:  Procedure Laterality Date  . CARDIAC CATHETERIZATION  03-26-2010  DR Martinique   SEVERE LV DYSFUNCTION WITH MARKEDLY ELEVATED FILLING PRESSURE/ SEVERE PULMONARY HYPERTENSION/ NORMAL CORONARY ANATOMY  . colonoscopy    . COLONOSCOPY  2013  . CYSTOSCOPY WITH RETROGRADE PYELOGRAM, URETEROSCOPY AND STENT PLACEMENT Right 08/18/2012   Procedure: CYSTOSCOPY WITH RETROGRADE PYELOGRAM, URETEROSCOPY AND  STENT PLACEMENT;  Surgeon: Molli Hazard, MD;  Location: [facility];  Service: Urology;  Laterality: Right;  . KIDNEY STONE SURGERY     X 2  . KNEE ARTHROSCOPY W/ MENISCECTOMY Left 02-06-2011   AND CHONDROPLASTY  . LUMBAR FUSION  08-10-2003   L5  --  S1  . NEPHROLITHOTOMY Right 09/15/2012   Procedure: NEPHROLITHOTOMY PERCUTANEOUS;  Surgeon: Molli Hazard, MD;  Location: [facility];  Service: Urology;  Laterality: Right;  RIGHT PERCUTANEOUS NEPHROSTOLITHOTOMY   . REMOVAL CYST LEFT LUNG  1994   AT  [facility]   benign  . RHINOPLASTY  1970'S   nose fx  . TRANSTHORACIC ECHOCARDIOGRAM  09-22-2011  DR CRENSHAW   EF 50%/  MILD LVH/ GRADE I DIASTOLIC DYSFUNCTION/ MILD TR        Home Medications    Prior to Admission medications   Medication Sig Start Date End Date Taking? Authorizing Provider  aspirin EC 81 MG tablet Take 1 tablet (81 mg total) by mouth daily. 12/24/17  Yes Janith Lima, MD  atorvastatin (LIPITOR) 40 MG tablet Take 1 tablet (40 mg total) by mouth daily. 06/23/17  Yes Janith Lima, MD  carvedilol (COREG) 25 MG tablet Take 1 tablet (25 mg total) by mouth 2 (two) times daily with a meal. Patient taking differently: Take 25 mg by mouth every morning.  12/24/17  Yes Janith Lima, MD  cetirizine (ZYRTEC) 10 MG chewable tablet Chew 1 tablet (10 mg total) by mouth daily. 11/11/15  Yes Quintella Reichert, MD  chlorthalidone (HYGROTON) 25 MG tablet Take 1 tablet (25 mg total) by mouth daily. 12/24/17  Yes Janith Lima, MD  telmisartan (MICARDIS) 40 MG tablet Take 1 tablet (40 mg total) by mouth daily. 06/23/17  Yes Janith Lima, MD  thiamine (VITAMIN B-1) 50 MG tablet Take 1 tablet (50 mg total) by mouth daily. Patient taking differently: Take 25 mg by mouth daily.  12/24/17  Yes Janith Lima, MD  traMADol (ULTRAM) 50 MG tablet Take 1 tablet (50 mg total) by mouth every 12 (twelve) hours as needed for moderate pain. 12/22/17  Yes Marrian Salvage, FNP  allopurinol (ZYLOPRIM) 300 MG tablet TAKE 1 TABLET (300 MG TOTAL) BY MOUTH DAILY. 06/23/13 07/30/14  Janith Lima, MD    Family History Family History  Problem Relation Age of Onset  . Diabetes Mother   . Hypertension Mother   . Diabetes Father   . Hypertension Father   . Colon cancer Paternal Grandfather     Social History Social History   Tobacco Use  . Smoking status: Current Every Day Smoker    Packs/day: 0.00    Years: 32.00    Pack years: 0.00    Types: Cigars    Last attempt to quit: 02/27/2007    Years  since quitting: 10.8  . Smokeless tobacco: Never Used  Substance Use Topics  . Alcohol use: Yes    Alcohol/week: 14.0 standard drinks    Types: 14 Cans of beer per week    Comment: HX ALCOHOL ABUSE--  pt states he drinks 2 beers a day  . Drug use: No     Allergies   Lisinopril; Metformin; and Metformin and related   Review of Systems Review of Systems  Constitutional: Negative for chills and fever.  HENT: Negative for ear pain and sore throat.   Eyes: Negative for pain and visual disturbance.  Respiratory: Negative for cough and shortness of breath.  Cardiovascular: Negative for chest pain and palpitations.  Gastrointestinal: Negative for abdominal pain and vomiting.  Genitourinary: Negative for dysuria and hematuria.  Musculoskeletal: Positive for back pain (right paraspinal muscle tenderness to palpation). Negative for arthralgias.  Skin: Negative for color change and rash.  Neurological: Negative for seizures and syncope.  All other systems reviewed and are negative.    Physical Exam Updated Vital Signs BP (!) 137/91 (BP Location: Right Arm)   Pulse (!) 55   Temp 98 F (36.7 C) (Oral)   Resp 20   SpO2 100%   Physical Exam  Constitutional: He is oriented to person, place, and time. He appears well-developed and well-nourished.  HENT:  Head: Normocephalic and atraumatic.  Mouth/Throat: Oropharynx is clear and moist.  Eyes: Pupils are equal, round, and reactive to light. No scleral icterus.  Neck: Normal range of motion.  Cardiovascular: Normal heart sounds.  Pulmonary/Chest: Effort normal and breath sounds normal. He has no wheezes. He exhibits no tenderness.  Abdominal: Soft. Bowel sounds are normal. He exhibits no distension. There is no tenderness.  Musculoskeletal: He exhibits tenderness. He exhibits no deformity.       Lumbar back: He exhibits pain and spasm. He exhibits no tenderness, no bony tenderness, no swelling, no edema, no deformity, no laceration and  normal pulse.       Back:  Muscle tenderness reproducible with palpation.   Neurological: He is alert and oriented to person, place, and time.  Skin: Skin is warm and dry.  Nursing note and vitals reviewed.    ED Treatments / Results  Labs (all labs ordered are listed, but only abnormal results are displayed) Labs Reviewed - No data to display  EKG None  Radiology No results found.  Procedures Procedures (including critical care time)  Medications Ordered in ED Medications - No data to display   Initial Impression / Assessment and Plan / ED Course  I have reviewed the triage vital signs and the nursing notes.  Pertinent labs & imaging results that were available during my care of the patient were reviewed by me and considered in my medical decision making (see chart for details).      Patient presents with right flank pain.Patient was seen by Dr. Senaida Lange who advised him this was not a renal cyst issue but MS[facility].A previous CT was ordered at [place] ED which showed renal cyst and warrant further imaging with a US Renal.  [place] right Renal showed multiple renal cysts, no obstructing focus in either kidney.  Upon examination patient's pain is reproducible with palpation, I believe MSK is likely will prescribe patient some muscle relaxers and Mobic for his pain. Return precautions provided.   Final Clinical Impressions(s) / ED Diagnoses   Final diagnoses:  Right flank pain    ED Discharge Orders    None       Janeece Fitting, Hershal Coria 12/29/17 1655    Noemi Chapel, MD 12/31/17 1012

## 2017-12-29 NOTE — Telephone Encounter (Signed)
Pt contacted and offered appt for tomorrow at 8 pm.   Pt stated that he was in the ED.

## 2017-12-30 ENCOUNTER — Other Ambulatory Visit (HOSPITAL_COMMUNITY): Payer: Self-pay | Admitting: Nephrology

## 2017-12-30 DIAGNOSIS — N182 Chronic kidney disease, stage 2 (mild): Secondary | ICD-10-CM

## 2018-01-08 ENCOUNTER — Ambulatory Visit (HOSPITAL_COMMUNITY): Payer: Managed Care, Other (non HMO)

## 2018-01-13 ENCOUNTER — Ambulatory Visit: Payer: Managed Care, Other (non HMO) | Admitting: Internal Medicine

## 2018-01-14 ENCOUNTER — Other Ambulatory Visit: Payer: Self-pay | Admitting: Radiology

## 2018-01-15 ENCOUNTER — Ambulatory Visit (HOSPITAL_COMMUNITY)
Admission: RE | Admit: 2018-01-15 | Discharge: 2018-01-15 | Disposition: A | Discharge status: Home / Self Care | Payer: Managed Care, Other (non HMO) | Source: Ambulatory Visit | Attending: Nephrology | Admitting: Nephrology

## 2018-01-15 ENCOUNTER — Encounter (HOSPITAL_COMMUNITY): Payer: Self-pay

## 2018-01-15 DIAGNOSIS — Z981 Arthrodesis status: Secondary | ICD-10-CM | POA: Diagnosis not present

## 2018-01-15 DIAGNOSIS — K219 Gastro-esophageal reflux disease without esophagitis: Secondary | ICD-10-CM | POA: Insufficient documentation

## 2018-01-15 DIAGNOSIS — E1122 Type 2 diabetes mellitus with diabetic chronic kidney disease: Secondary | ICD-10-CM | POA: Diagnosis not present

## 2018-01-15 DIAGNOSIS — Z86010 Personal history of colonic polyps: Secondary | ICD-10-CM | POA: Insufficient documentation

## 2018-01-15 DIAGNOSIS — F17290 Nicotine dependence, other tobacco product, uncomplicated: Secondary | ICD-10-CM | POA: Diagnosis not present

## 2018-01-15 DIAGNOSIS — M1990 Unspecified osteoarthritis, unspecified site: Secondary | ICD-10-CM | POA: Diagnosis not present

## 2018-01-15 DIAGNOSIS — Z888 Allergy status to other drugs, medicaments and biological substances status: Secondary | ICD-10-CM | POA: Diagnosis not present

## 2018-01-15 DIAGNOSIS — Z8249 Family history of ischemic heart disease and other diseases of the circulatory system: Secondary | ICD-10-CM | POA: Insufficient documentation

## 2018-01-15 DIAGNOSIS — Z7982 Long term (current) use of aspirin: Secondary | ICD-10-CM | POA: Diagnosis not present

## 2018-01-15 DIAGNOSIS — N281 Cyst of kidney, acquired: Secondary | ICD-10-CM | POA: Diagnosis present

## 2018-01-15 DIAGNOSIS — N182 Chronic kidney disease, stage 2 (mild): Secondary | ICD-10-CM | POA: Insufficient documentation

## 2018-01-15 DIAGNOSIS — R109 Unspecified abdominal pain: Secondary | ICD-10-CM | POA: Diagnosis not present

## 2018-01-15 DIAGNOSIS — M109 Gout, unspecified: Secondary | ICD-10-CM | POA: Diagnosis not present

## 2018-01-15 DIAGNOSIS — I5022 Chronic systolic (congestive) heart failure: Secondary | ICD-10-CM | POA: Insufficient documentation

## 2018-01-15 DIAGNOSIS — Z79899 Other long term (current) drug therapy: Secondary | ICD-10-CM | POA: Diagnosis not present

## 2018-01-15 DIAGNOSIS — E781 Pure hyperglyceridemia: Secondary | ICD-10-CM | POA: Diagnosis not present

## 2018-01-15 DIAGNOSIS — Q613 Polycystic kidney, unspecified: Secondary | ICD-10-CM | POA: Diagnosis not present

## 2018-01-15 DIAGNOSIS — I428 Other cardiomyopathies: Secondary | ICD-10-CM | POA: Insufficient documentation

## 2018-01-15 DIAGNOSIS — I130 Hypertensive heart and chronic kidney disease with heart failure and stage 1 through stage 4 chronic kidney disease, or unspecified chronic kidney disease: Secondary | ICD-10-CM | POA: Insufficient documentation

## 2018-01-15 DIAGNOSIS — Z87442 Personal history of urinary calculi: Secondary | ICD-10-CM | POA: Diagnosis not present

## 2018-01-15 DIAGNOSIS — Z833 Family history of diabetes mellitus: Secondary | ICD-10-CM | POA: Insufficient documentation

## 2018-01-15 LAB — GLUCOSE, CAPILLARY: GLUCOSE-CAPILLARY: 101 mg/dL — AB (ref 70–99)

## 2018-01-15 LAB — CBC
HEMATOCRIT: 39.6 % (ref 39.0–52.0)
Hemoglobin: 12.4 g/dL — ABNORMAL LOW (ref 13.0–17.0)
MCH: 30.8 pg (ref 26.0–34.0)
MCHC: 31.3 g/dL (ref 30.0–36.0)
MCV: 98.5 fL (ref 78.0–100.0)
PLATELETS: 250 10*3/uL (ref 150–400)
RBC: 4.02 MIL/uL — ABNORMAL LOW (ref 4.22–5.81)
RDW: 12.8 % (ref 11.5–15.5)
WBC: 4.9 10*3/uL (ref 4.0–10.5)

## 2018-01-15 LAB — PROTIME-INR
INR: 0.94
PROTHROMBIN TIME: 12.5 s (ref 11.4–15.2)

## 2018-01-15 MED ORDER — LIDOCAINE HCL (PF) 1 % IJ SOLN
INTRAMUSCULAR | Status: AC
  Filled 2018-01-15: qty 30

## 2018-01-15 MED ORDER — FENTANYL CITRATE (PF) 100 MCG/2ML IJ SOLN
INTRAMUSCULAR | Status: AC | PRN
  Administered 2018-01-15: 50 ug via INTRAVENOUS

## 2018-01-15 MED ORDER — SODIUM CHLORIDE 0.9 % IV SOLN: INTRAVENOUS | Status: DC

## 2018-01-15 MED ORDER — MIDAZOLAM HCL 2 MG/2ML IJ SOLN
INTRAMUSCULAR | Status: AC
  Filled 2018-01-15: qty 2

## 2018-01-15 MED ORDER — SODIUM CHLORIDE 0.9 % IV SOLN
INTRAVENOUS | Status: AC | PRN
  Administered 2018-01-15: 10 mL/h via INTRAVENOUS

## 2018-01-15 MED ORDER — MIDAZOLAM HCL 2 MG/2ML IJ SOLN
INTRAMUSCULAR | Status: AC | PRN
  Administered 2018-01-15: 1 mg via INTRAVENOUS

## 2018-01-15 MED ORDER — FENTANYL CITRATE (PF) 100 MCG/2ML IJ SOLN
INTRAMUSCULAR | Status: AC
  Filled 2018-01-15: qty 2

## 2018-01-15 NOTE — Sedation Documentation (Signed)
Patient is resting comfortably. 

## 2018-01-15 NOTE — Sedation Documentation (Signed)
Patient denies pain and is resting comfortably.  

## 2018-01-15 NOTE — Consult Note (Signed)
Chief Complaint: Patient was seen in consultation today for right renal cyst aspiration at the request of Webb,Martin  Referring Physician(s): Webb,Martin  Supervising Physician: Daryll Brod  Patient Status: [facility] - Out-pt  History of Present Illness: Mike Orozco is a 57 y.o. male   Known CKD Polycystic kidneys Follows with Dr Justin Mend HTN Developed right flank pain early Aug Imaging:  There are multiple cystic areas in the right kidney. The largest cyst arises from the upper pole the right kidney measuring 5.3 x 5.6 x 7.2 cm. Prominent prostate with inhomogeneous echotexture. This finding warrants direct clinical examination and PSA to further assess  Scheduled for right renal cyst aspiration per Dr Justin Mend request   Past Medical History:  Diagnosis Date  . Anemia   . Arthritis   . CHF (congestive heart failure) ([place])   . Chronic renal insufficiency baseline CRE  1.5--1.8   NEPHROLOGIST--  DR WEBB ([facility])  . Diabetes mellitus type 2, insulin dependent (HCC) PER pt and LAST PCP NOTE CBG'S IN 100'   RECENTLY UNCONTROLLED AIC 07-07-2012  9.4 AND CBG'S 400'S  . GERD (gastroesophageal reflux disease)   . Gout, unspecified    stable  per pt  . Heart block AV first degree   . Heart failure, chronic systolic ([place])   . History of ETOH abuse   . Hypertension    CARDIOLOGIST-  DR Stanford Breed--- LOV IN [facility] 09-08-2011  . Nephrolithiasis    RIGHT  . Non-ischemic cardiomyopathy ([place]) SECONDARY TO HX UNCONTROLLED HTN AND ETOH ABUSE----  LAST ECHO EF 50%  09-22-2011   a) Echo 03/25/2010: EF 20-25%; mild AI; Mod MR; Mild LAE  b)cardiac cath 03/26/2010: normal cors; severe pulmonary HTN;PCWP 41; EF 10-15%;  c. echo 4/12:  EF 45-50%, mild LVH, grade 1 diast dysfxn, mild AI, mild LAE  . Personal history of colonic polyps-adenoma 02/17/2012   01/2012 - diminutive adenoma Repeat colon about 01/2017    . Polycystic kidney disease    BILATERAL RENAL CYST  . Pure hyperglyceridemia      Past Surgical History:  Procedure Laterality Date  . CARDIAC CATHETERIZATION  03-26-2010  DR Martinique   SEVERE LV DYSFUNCTION WITH MARKEDLY ELEVATED FILLING PRESSURE/ SEVERE PULMONARY HYPERTENSION/ NORMAL CORONARY ANATOMY  . colonoscopy    . COLONOSCOPY  2013  . CYSTOSCOPY WITH RETROGRADE PYELOGRAM, URETEROSCOPY AND STENT PLACEMENT Right 08/18/2012   Procedure: CYSTOSCOPY WITH RETROGRADE PYELOGRAM, URETEROSCOPY AND STENT PLACEMENT;  Surgeon: Molli Hazard, MD;  Location: [facility];  Service: Urology;  Laterality: Right;  . KIDNEY STONE SURGERY     X 2  . KNEE ARTHROSCOPY W/ MENISCECTOMY Left 02-06-2011   AND CHONDROPLASTY  . LUMBAR FUSION  08-10-2003   L5  --  S1  . NEPHROLITHOTOMY Right 09/15/2012   Procedure: NEPHROLITHOTOMY PERCUTANEOUS;  Surgeon: Molli Hazard, MD;  Location: [facility];  Service: Urology;  Laterality: Right;  RIGHT PERCUTANEOUS NEPHROSTOLITHOTOMY   . REMOVAL CYST LEFT LUNG  1994   AT [facility]   benign  . RHINOPLASTY  1970'S   nose fx  . TRANSTHORACIC ECHOCARDIOGRAM  09-22-2011  DR CRENSHAW   EF 50%/  MILD LVH/ GRADE I DIASTOLIC DYSFUNCTION/ MILD TR    Allergies: Lisinopril; Metformin; and Metformin and related  Medications: Prior to Admission medications   Medication Sig Start Date End Date Taking? Authorizing Provider  aspirin EC 81 MG tablet Take 1 tablet (81 mg total) by mouth daily. 12/24/17   Janith Lima, MD  atorvastatin (  LIPITOR) 40 MG tablet Take 1 tablet (40 mg total) by mouth daily. 06/23/17   Janith Lima, MD  carvedilol (COREG) 25 MG tablet Take 1 tablet (25 mg total) by mouth 2 (two) times daily with a meal. Patient taking differently: Take 25 mg by mouth every morning.  12/24/17   Janith Lima, MD  cetirizine (ZYRTEC) 10 MG chewable tablet Chew 1 tablet (10 mg total) by mouth daily. 11/11/15   Quintella Reichert, MD  chlorthalidone (HYGROTON) 25 MG tablet Take 1 tablet (25 mg total) by mouth daily. 12/24/17   Janith Lima, MD  telmisartan (MICARDIS) 40 MG tablet Take 1 tablet (40 mg total) by mouth daily. 06/23/17   Janith Lima, MD  thiamine (VITAMIN B-1) 50 MG tablet Take 1 tablet (50 mg total) by mouth daily. Patient taking differently: Take 25 mg by mouth daily.  12/24/17   Janith Lima, MD  traMADol (ULTRAM) 50 MG tablet Take 1 tablet (50 mg total) by mouth every 12 (twelve) hours as needed for moderate pain. 12/22/17   Marrian Salvage, FNP     Family History  Problem Relation Age of Onset  . Diabetes Mother   . Hypertension Mother   . Diabetes Father   . Hypertension Father   . Colon cancer Paternal Grandfather     Social History   Socioeconomic History  . Marital status: Married    Spouse name: Not on file  . Number of children: 1  . Years of education: Not on file  . Highest education level: Not on file  Occupational History  . Occupation: FORK LIFT    Employer: [employer]  . Financial resource strain: Not on file  . Food insecurity:    Worry: Not on file    Inability: Not on file  . Transportation needs:    Medical: Not on file    Non-medical: Not on file  Tobacco Use  . Smoking status: Current Every Day Smoker    Packs/day: 0.00    Years: 32.00    Pack years: 0.00    Types: Cigars    Last attempt to quit: 02/27/2007    Years since quitting: 10.8  . Smokeless tobacco: Never Used  Substance and Sexual Activity  . Alcohol use: Yes    Alcohol/week: 14.0 standard drinks    Types: 14 Cans of beer per week    Comment: HX ALCOHOL ABUSE--  pt states he drinks 2 beers a day  . Drug use: No  . Sexual activity: Not on file  Lifestyle  . Physical activity:    Days per week: Not on file    Minutes per session: Not on file  . Stress: Not on file  Relationships  . Social connections:    Talks on phone: Not on file    Gets together: Not on file    Attends religious service: Not on file    Active member of club or  organization: Not on file    Attends meetings of clubs or organizations: Not on file    Relationship status: Not on file  Other Topics Concern  . Not on file  Social History Narrative   No regular exercise   Daily caffeine       Review of Systems: A 12 point ROS discussed and pertinent positives are indicated in the HPI above.  All other systems are negative.  Review of Systems  Constitutional: Negative  for activity change, appetite change, fatigue and fever.  Respiratory: Negative for cough and shortness of breath.   Cardiovascular: Negative for chest pain.  Gastrointestinal: Negative for abdominal pain.  Genitourinary: Positive for flank pain.  Musculoskeletal: Negative for back pain.  Psychiatric/Behavioral: Negative for behavioral problems and confusion.    Vital Signs: BP (!) 154/94   Pulse (!) 52   Temp 97.6 F (36.4 C) (Oral)   Ht 5\' 6"  (1.676 m)   Wt 190 lb (86.2 kg)   SpO2 98%   BMI 30.67 kg/m   Physical Exam  Constitutional: He is oriented to person, place, and time.  Cardiovascular: Normal rate, regular rhythm and normal heart sounds.  Pulmonary/Chest: Effort normal.  Abdominal: Soft. Bowel sounds are normal.  Musculoskeletal: Normal range of motion.  Neurological: He is alert and oriented to person, place, and time.  Skin: Skin is warm and dry.  Psychiatric: He has a normal mood and affect. His behavior is normal. Judgment and thought content normal.  Nursing note and vitals reviewed.   Imaging: US Renal  Result Date: 12/29/2017 CLINICAL DATA:  One week history of right flank pain. History of adult polycystic kidney disease EXAM: RENAL / URINARY TRACT ULTRASOUND COMPLETE COMPARISON:  December 21, 2017 CT abdomen and pelvis FINDINGS: Right Kidney: Length: 14.3 cm. Echogenicity and renal cortical thickness are within normal limits. No perinephric fluid or hydronephrosis visualized. There are multiple cystic areas in the right kidney. The largest cyst arises  from the upper pole the right kidney measuring 5.3 x 5.6 x 7.2 cm. A nearby cyst in this area measures 4.9 x 3.2 x 4.0 cm. A cyst more inferiorly measures 4.7 x 3.9 x 3.5 cm. Smaller cysts are noted on the right as well. No sonographically demonstrable calculus or ureterectasis. Left Kidney: Length: 13.0 cm. Echogenicity and renal cortical thickness are within normal limits. No perinephric fluid or hydronephrosis visualized. There are multiple cysts on the left. The largest cyst is seen in the upper pole region measuring 3.1 x 3.1 x 3.7 cm. A nearby cyst in this area measures 2.8 x 2.5 x 2.4 cm. More inferiorly, there is a 2.2 x 2.4 x 2.7 cm cyst. Multiple smaller cysts noted. No sonographically demonstrable calculus or ureterectasis. Bladder: Appears normal for degree of bladder distention. Prostate appears somewhat inhomogeneous with a measured size of 4.6 x 3.3 x 3.8 cm. IMPRESSION: Multiple renal cysts. No noncystic renal lesions evident. No obstructing focus in either kidney. No calculi demonstrable on this ultrasound examination. Prominent prostate with inhomogeneous echotexture. This finding warrants direct clinical examination and PSA to further assess. Electronically Signed   By: Lowella Grip III M.D.   On: 12/29/2017 16:27    Labs:  CBC: Recent Labs    06/22/17 1634 01/15/18 0637  WBC 5.2 4.9  HGB 13.6 12.4*  HCT 40.0 39.6  PLT 328.0 250    COAGS: Recent Labs    01/15/18 0637  INR 0.94    BMP: Recent Labs    06/22/17 1634  NA 138  K 3.4*  CL 99  CO2 28  GLUCOSE 148*  BUN 22  CALCIUM 9.8  CREATININE 1.23    LIVER FUNCTION TESTS: Recent Labs    06/22/17 1634  BILITOT 0.8  AST 23  ALT 27  ALKPHOS 63  PROT 7.7  ALBUMIN 4.7    TUMOR MARKERS: No results for input(s): AFPTM, CEA, CA199, CHROMGRNA in the last 8760 hours.  Assessment and Plan:  CKD Polycystic kidneys Rt  flank pain Scheduled for right renal cyst aspiration Risks and benefits discussed  with the patient including, but not limited to bleeding, infection, damage to adjacent structures or low yield requiring additional tests. All of the patient's questions were answered, patient is agreeable to proceed. Consent signed and in chart.  Thank you for this interesting consult.  I greatly enjoyed meeting Mike Orozco and look forward to participating in their care.  A copy of this report was sent to the requesting provider on this date.  Electronically Signed: Lavonia Drafts, PA-C 01/15/2018, 7:32 AM   I spent a total of  30 Minutes   in face to face in clinical consultation, greater than 50% of which was counseling/coordinating care for right renal cyst aspiration

## 2018-01-15 NOTE — Sedation Documentation (Signed)
Patient denies pain and is resting comfortably. Patient left a message on wife's phone that he would be in Short Stay when she got here.

## 2018-01-15 NOTE — Procedures (Signed)
8cm RUP RENAL CYST  S/P US ASPIRATION 130 CC NO COMP STABLE EBL 0 FULL REPORT IN PACS CYTO SENT

## 2018-01-15 NOTE — Progress Notes (Signed)
Med list not reconciled by pharmacy. Paged to update. Pt took no meds today.

## 2018-01-15 NOTE — Discharge Instructions (Signed)
Return to Work ___________Larry Gibson________________________________________ was treated at our facility. 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  Employee may return to modified work on ______________________. Work activity restrictions Work activities that are not tolerated include: ___Bending ___Prolonged sitting _X__Lifting more than ___5_____ lb ___Squatting ___ Prolonged standing ___Climbing ___Reaching _X__Pushing and pulling ___ Walking ___Other ______________________ These restrictions are effective until ______________________. Show this Return to Work statement to your supervisor at work as soon as possible. Your employer should be aware of your condition and can help with the necessary work activity restrictions. If you wish to return to work sooner than the date that is listed above, or if you have further problems that make it difficult for you to return at that time, please call our clinic or your health care provider. _________________________________________ Health Care Provider Name (printed) _________________________________________ Health Care Provider (signature) _________________________________________ Date This information is not intended to replace advice given to you by your health care provider. Make sure you discuss any questions you have with your health care provider. Document Released: 05/05/2005 Document Revised: 04/18/2016 Document Reviewed: 12/02/2013 Elsevier Interactive Patient Education  2018 [place]. Needle Biopsy, Care After Refer to this sheet in the next few weeks. These instructions provide you with information about caring for yourself after your procedure. Your health care provider may also give you more specific instructions. Your treatment has been planned according to current medical practices, but  problems sometimes occur. Call your health care provider if you have any problems or questions after your procedure. What can I expect after the procedure? After your procedure, it is common to have soreness, bruising, or mild pain at the biopsy site. This should go away in a few days. Follow these instructions at home:  Rest as directed by your health care provider.  Take medicines only as directed by your health care provider.  There are many different ways to close and cover the biopsy site, including stitches (sutures), skin glue, and adhesive strips. Follow your health care provider's instructions about: ? Biopsy site care. ? Bandage (dressing) changes and removal. ? Biopsy site closure removal.  Check your biopsy site every day for signs of infection. Watch for: ? Redness, swelling, or pain. ? Fluid, blood, or pus. Contact a health care provider if:  You have a fever.  You have redness, swelling, or pain at the biopsy site that lasts longer than a few days.  You have fluid, blood, or pus coming from the biopsy site.  You feel nauseous.  You vomit. Get help right away if:  You have shortness of breath.  You have trouble breathing.  You have chest pain.  You feel dizzy or you faint.  You have bleeding that does not stop with pressure or a bandage.  You cough up blood.  You have pain in your abdomen. This information is not intended to replace advice given to you by your health care provider. Make sure you discuss any questions you have with your health care provider. Document Released: 09/19/2014 Document Revised: 10/11/2015 Document Reviewed: 05/01/2014 Elsevier Interactive Patient Education  [employer].

## 2018-01-27 ENCOUNTER — Ambulatory Visit: Payer: Managed Care, Other (non HMO) | Admitting: Internal Medicine

## 2018-02-09 ENCOUNTER — Ambulatory Visit: Payer: Managed Care, Other (non HMO) | Admitting: Internal Medicine

## 2018-02-09 DIAGNOSIS — Z0289 Encounter for other administrative examinations: Secondary | ICD-10-CM

## 2018-04-07 ENCOUNTER — Other Ambulatory Visit: Payer: Self-pay | Admitting: Internal Medicine

## 2018-04-07 DIAGNOSIS — I10 Essential (primary) hypertension: Secondary | ICD-10-CM

## 2018-04-20 ENCOUNTER — Other Ambulatory Visit: Payer: Self-pay | Admitting: Internal Medicine

## 2018-04-20 DIAGNOSIS — I10 Essential (primary) hypertension: Secondary | ICD-10-CM

## 2018-05-03 ENCOUNTER — Telehealth: Payer: Self-pay | Admitting: Internal Medicine

## 2018-05-03 DIAGNOSIS — I10 Essential (primary) hypertension: Secondary | ICD-10-CM

## 2018-05-03 NOTE — Telephone Encounter (Signed)
Pt scheduled an appt for 06/01/2018, next available. Patient said the pharmacy gave him 3 pills to get him by. Patient would like to know can he have a refill on carvedilol (COREG) 25 MG tablet until he can be seen. Please Advise.

## 2018-05-08 ENCOUNTER — Other Ambulatory Visit: Payer: Self-pay | Admitting: *Deleted

## 2018-05-08 DIAGNOSIS — I10 Essential (primary) hypertension: Secondary | ICD-10-CM

## 2018-05-08 MED ORDER — CARVEDILOL 25 MG PO TABS: 25.0000 mg | ORAL_TABLET | Freq: Two times a day (BID) | ORAL | 0 refills | Status: DC

## 2018-05-10 ENCOUNTER — Telehealth: Payer: Self-pay | Admitting: Internal Medicine

## 2018-05-10 ENCOUNTER — Other Ambulatory Visit: Payer: Self-pay | Admitting: Internal Medicine

## 2018-05-10 DIAGNOSIS — I10 Essential (primary) hypertension: Secondary | ICD-10-CM

## 2018-05-10 NOTE — Telephone Encounter (Signed)
° ° ° °  Pt is asking why he med was refused pt said he has plenty Carvedilol    He said it is the CHLORTHALIDONE he needs and he need that meds called in today   Mike Orozco

## 2018-05-10 NOTE — Telephone Encounter (Signed)
Copied from [place] [phone]. Topic: Quick Communication - Rx Refill/Question >> May 10, 2018 12:03 PM Burchel, Abbi R wrote: Medication: chlorthalidone (HYGROTON) 25 MG tablet  Pt states wrong rx was called in. Please send as soon as possible. Pt is completely out his medication.   Preferred Pharmacy: [id number] [place], [place] [address] [facility] [address] [place], [address] [place] [address] Phone: [phone] Fax: [phone]    Pt was advised that RX refills may take up to 3 business days. We ask that you follow-up with your pharmacy.

## 2018-05-10 NOTE — Telephone Encounter (Signed)
Reviewed medication list and I don't not know if pt has been consistent with medication since last rx was sent in on 12/24/2017 for a 30 day supply. LOV was 06/22/2017. Pt was to follow up in June.

## 2018-05-10 NOTE — Telephone Encounter (Signed)
Requested medication (s) are due for refill today: yes  Requested medication (s) are on the active medication list: yes  Last refill:  12/24/17 for 30 tabs  Future visit scheduled: yes  Notes to clinic:  Cardiovascular diuretics - thiazide failed.  Requested Prescriptions  Pending Prescriptions Disp Refills   chlorthalidone (HYGROTON) 25 MG tablet 30 tablet 0    Sig: Take 1 tablet (25 mg total) by mouth daily.     Cardiovascular: Diuretics - Thiazide Failed - 05/10/2018 12:20 PM      Failed - K in normal range and within 360 days    Potassium  Date Value Ref Range Status  06/22/2017 3.4 (L) 3.5 - 5.1 mEq/L Final         Failed - Last BP in normal range    BP Readings from Last 1 Encounters:  01/15/18 (!) 146/92         Passed - Ca in normal range and within 360 days    Calcium  Date Value Ref Range Status  06/22/2017 9.8 8.4 - 10.5 mg/dL Final         Passed - Cr in normal range and within 360 days    Creatinine, Ser  Date Value Ref Range Status  06/22/2017 1.23 0.40 - 1.50 mg/dL Final         Passed - Na in normal range and within 360 days    Sodium  Date Value Ref Range Status  06/22/2017 138 135 - 145 mEq/L Final  07/04/2016 138 137 - 147 mmol/L Final         Passed - Valid encounter within last 6 months    Recent Outpatient Visits          4 months ago Renal cyst   [place], Marvis Repress, [place]   10 months ago Right foot pain   [place], Jeremy E, MD   10 months ago Type 2 diabetes mellitus with complication, without long-term current use of insulin ([place])   [place], Thomas L, MD   1 year ago Need for prophylactic vaccination and inoculation against influenza   [place], Thomas L, MD   2 years ago Chronic renal insufficiency, stage Roland, MD      Future  Appointments            In 3 weeks Ronnald Ramp Arvid Right, MD [place], [facility]

## 2018-05-11 NOTE — Telephone Encounter (Signed)
Patient calling in to check update on medicine.Please advise

## 2018-05-11 NOTE — Telephone Encounter (Signed)
Previous rq for chlorthalidone was denied. Reviewed labs and med list. K value was low in Feb and pt is not on a supplement.   PCP is out of the office this week.   Pt contacted and informed that since follow up was not done in June and we do not have current lads, we are not able to refill the chlorthalidone. Pt stated understanding and will continue the carvedilol and follow up at his January appt.

## 2018-06-01 ENCOUNTER — Other Ambulatory Visit (INDEPENDENT_AMBULATORY_CARE_PROVIDER_SITE_OTHER): Payer: Managed Care, Other (non HMO)

## 2018-06-01 ENCOUNTER — Encounter: Payer: Self-pay | Admitting: Internal Medicine

## 2018-06-01 ENCOUNTER — Ambulatory Visit (INDEPENDENT_AMBULATORY_CARE_PROVIDER_SITE_OTHER): Payer: Managed Care, Other (non HMO) | Admitting: Internal Medicine

## 2018-06-01 VITALS — BP 174/108 | HR 80 | Temp 99.2°F | Resp 16 | Ht 66.0 in | Wt 183.0 lb

## 2018-06-01 DIAGNOSIS — I10 Essential (primary) hypertension: Secondary | ICD-10-CM | POA: Diagnosis not present

## 2018-06-01 DIAGNOSIS — E118 Type 2 diabetes mellitus with unspecified complications: Secondary | ICD-10-CM

## 2018-06-01 DIAGNOSIS — D638 Anemia in other chronic diseases classified elsewhere: Secondary | ICD-10-CM | POA: Diagnosis not present

## 2018-06-01 DIAGNOSIS — N182 Chronic kidney disease, stage 2 (mild): Secondary | ICD-10-CM

## 2018-06-01 DIAGNOSIS — M1000 Idiopathic gout, unspecified site: Secondary | ICD-10-CM | POA: Diagnosis not present

## 2018-06-01 DIAGNOSIS — I5022 Chronic systolic (congestive) heart failure: Secondary | ICD-10-CM

## 2018-06-01 DIAGNOSIS — E781 Pure hyperglyceridemia: Secondary | ICD-10-CM

## 2018-06-01 DIAGNOSIS — E785 Hyperlipidemia, unspecified: Secondary | ICD-10-CM | POA: Diagnosis not present

## 2018-06-01 DIAGNOSIS — E519 Thiamine deficiency, unspecified: Secondary | ICD-10-CM

## 2018-06-01 DIAGNOSIS — R7989 Other specified abnormal findings of blood chemistry: Secondary | ICD-10-CM

## 2018-06-01 DIAGNOSIS — E039 Hypothyroidism, unspecified: Secondary | ICD-10-CM

## 2018-06-01 DIAGNOSIS — N2889 Other specified disorders of kidney and ureter: Secondary | ICD-10-CM

## 2018-06-01 LAB — URINALYSIS, ROUTINE W REFLEX MICROSCOPIC
Bilirubin Urine: NEGATIVE
Ketones, ur: NEGATIVE
Leukocytes, UA: NEGATIVE
Nitrite: NEGATIVE
Specific Gravity, Urine: 1.025 (ref 1.000–1.030)
Urine Glucose: NEGATIVE
Urobilinogen, UA: 0.2 (ref 0.0–1.0)
WBC, UA: NONE SEEN (ref 0–?)
pH: 6 (ref 5.0–8.0)

## 2018-06-01 LAB — COMPREHENSIVE METABOLIC PANEL
ALK PHOS: 66 U/L (ref 39–117)
ALT: 21 U/L (ref 0–53)
AST: 21 U/L (ref 0–37)
Albumin: 4.3 g/dL (ref 3.5–5.2)
BILIRUBIN TOTAL: 0.7 mg/dL (ref 0.2–1.2)
BUN: 15 mg/dL (ref 6–23)
CO2: 27 mEq/L (ref 19–32)
Calcium: 9.8 mg/dL (ref 8.4–10.5)
Chloride: 105 mEq/L (ref 96–112)
Creatinine, Ser: 1.16 mg/dL (ref 0.40–1.50)
GFR: 82.98 mL/min (ref >60.00)
Glucose, Bld: 127 mg/dL — ABNORMAL HIGH (ref 70–99)
Potassium: 3.8 mEq/L (ref 3.5–5.1)
Sodium: 141 mEq/L (ref 135–145)
Total Protein: 7.4 g/dL (ref 6.0–8.3)

## 2018-06-01 LAB — LIPID PANEL
Cholesterol: 186 mg/dL (ref 0–200)
HDL: 46.2 mg/dL (ref >39.00)
NonHDL: 139.33
Total CHOL/HDL Ratio: 4
Triglycerides: 202 mg/dL — ABNORMAL HIGH (ref 0.0–149.0)
VLDL: 40.4 mg/dL — ABNORMAL HIGH (ref 0.0–40.0)

## 2018-06-01 LAB — CBC WITH DIFFERENTIAL/PLATELET
Basophils Absolute: 0.1 10*3/uL (ref 0.0–0.1)
Basophils Relative: 1.4 % (ref 0.0–3.0)
Eosinophils Absolute: 0.2 10*3/uL (ref 0.0–0.7)
Eosinophils Relative: 3.7 % (ref 0.0–5.0)
HCT: 37 % — ABNORMAL LOW (ref 39.0–52.0)
HEMOGLOBIN: 12.3 g/dL — AB (ref 13.0–17.0)
Lymphocytes Relative: 39.1 % (ref 12.0–46.0)
Lymphs Abs: 1.8 10*3/uL (ref 0.7–4.0)
MCHC: 33.4 g/dL (ref 30.0–36.0)
MCV: 95.2 fl (ref 78.0–100.0)
Monocytes Absolute: 0.3 10*3/uL (ref 0.1–1.0)
Monocytes Relative: 7.4 % (ref 3.0–12.0)
Neutro Abs: 2.3 10*3/uL (ref 1.4–7.7)
Neutrophils Relative %: 48.4 % (ref 43.0–77.0)
Platelets: 262 10*3/uL (ref 150.0–400.0)
RBC: 3.88 Mil/uL — ABNORMAL LOW (ref 4.22–5.81)
RDW: 14.7 % (ref 11.5–15.5)
WBC: 4.7 10*3/uL (ref 4.0–10.5)

## 2018-06-01 LAB — MICROALBUMIN / CREATININE URINE RATIO
Creatinine,U: 170.9 mg/dL
MICROALB UR: 14.4 mg/dL — AB (ref 0.0–1.9)
Microalb Creat Ratio: 8.4 mg/g (ref 0.0–30.0)

## 2018-06-01 LAB — HEMOGLOBIN A1C: Hgb A1c MFr Bld: 5.7 % (ref 4.6–6.5)

## 2018-06-01 LAB — LDL CHOLESTEROL, DIRECT: Direct LDL: 107 mg/dL

## 2018-06-01 LAB — URIC ACID: Uric Acid, Serum: 7.8 mg/dL (ref 4.0–7.8)

## 2018-06-01 MED ORDER — IRBESARTAN 300 MG PO TABS: 300.0000 mg | ORAL_TABLET | Freq: Every day | ORAL | 0 refills | Status: DC

## 2018-06-01 MED ORDER — VITAMIN B-1 50 MG PO TABS: 25.0000 mg | ORAL_TABLET | Freq: Every day | ORAL | 0 refills | Status: DC

## 2018-06-01 MED ORDER — CARVEDILOL 25 MG PO TABS: 25.0000 mg | ORAL_TABLET | Freq: Two times a day (BID) | ORAL | 0 refills | Status: DC

## 2018-06-01 MED ORDER — ATORVASTATIN CALCIUM 40 MG PO TABS: 40.0000 mg | ORAL_TABLET | Freq: Every day | ORAL | 1 refills | Status: DC

## 2018-06-01 MED ORDER — HYDROCHLOROTHIAZIDE 25 MG PO TABS: 25.0000 mg | ORAL_TABLET | Freq: Every day | ORAL | 0 refills | Status: DC

## 2018-06-01 NOTE — Progress Notes (Signed)
Subjective:  Patient ID: Mike Orozco, male    DOB: [birth year]  Age: 58 y.o. MRN: [id number]  CC: Anemia; Hypertension; Hypothyroidism; Hyperlipidemia; and Diabetes   HPI Mike Orozco presents for f/up - It sounds like he is taking carvedilol but not the thiazide diuretic or the ARB.  He is also not taking the statin.  He has a remote history of severe gout in both feet.  He complains of weight gain but denies CP, DOE, palpitations, edema, or fatigue.  Outpatient Medications Prior to Visit  Medication Sig Dispense Refill  . aspirin EC 81 MG tablet Take 1 tablet (81 mg total) by mouth daily. 30 tablet 0  . atorvastatin (LIPITOR) 40 MG tablet Take 1 tablet (40 mg total) by mouth daily. 90 tablet 1  . carvedilol (COREG) 25 MG tablet Take 1 tablet (25 mg total) by mouth 2 (two) times daily with a meal. 30 tablet 0  . cetirizine (ZYRTEC) 10 MG chewable tablet Chew 1 tablet (10 mg total) by mouth daily. 10 tablet 0  . thiamine (VITAMIN B-1) 50 MG tablet Take 1 tablet (50 mg total) by mouth daily. (Patient taking differently: Take 25 mg by mouth daily. ) 30 tablet 0  . traMADol (ULTRAM) 50 MG tablet Take 1 tablet (50 mg total) by mouth every 12 (twelve) hours as needed for moderate pain. 30 tablet 0  . chlorthalidone (HYGROTON) 25 MG tablet Take 1 tablet (25 mg total) by mouth daily. (Patient not taking: Reported on 06/01/2018) 30 tablet 0  . telmisartan (MICARDIS) 40 MG tablet Take 1 tablet (40 mg total) by mouth daily. (Patient not taking: Reported on 06/01/2018) 90 tablet 1   No facility-administered medications prior to visit.     ROS Review of Systems  Constitutional: Positive for unexpected weight change. Negative for diaphoresis and fatigue.  HENT: Negative.   Eyes: Negative.  Negative for visual disturbance.  Respiratory: Negative for cough, chest tightness, shortness of breath and wheezing.   Cardiovascular: Negative for chest pain, palpitations and leg swelling.  Gastrointestinal:  Negative for abdominal pain, constipation, diarrhea, nausea and vomiting.  Endocrine: Negative for cold intolerance, heat intolerance, polydipsia, polyphagia and polyuria.  Genitourinary: Negative.  Negative for difficulty urinating.  Musculoskeletal: Positive for arthralgias. Negative for myalgias and neck pain.  Skin: Negative.  Negative for color change, pallor and rash.  Neurological: Negative.  Negative for dizziness, weakness, light-headedness, numbness and headaches.  Hematological: Negative for adenopathy. Does not bruise/bleed easily.  Psychiatric/Behavioral: Negative.     Objective:  BP (!) 174/108 (BP Location: Left Arm, Patient Position: Sitting, Cuff Size: Normal)   Pulse 80   Temp 99.2 F (37.3 C) (Oral)   Resp 16   Ht 5\' 6"  (1.676 m)   Wt 183 lb (83 kg)   SpO2 97%   BMI 29.54 kg/m   BP Readings from Last 3 Encounters:  06/01/18 (!) 174/108  01/15/18 (!) 146/92  12/29/17 (!) 147/94    Wt Readings from Last 3 Encounters:  06/01/18 183 lb (83 kg)  01/15/18 190 lb (86.2 kg)  12/22/17 178 lb 1.9 oz (80.8 kg)    Physical Exam Vitals signs reviewed.  Constitutional:      Appearance: He is obese. He is not ill-appearing or diaphoretic.  HENT:     Right Ear: External ear normal.     Nose: Nose normal. No congestion or rhinorrhea.     Mouth/Throat:     Mouth: Mucous membranes are moist.     Pharynx:  Oropharynx is clear. No oropharyngeal exudate or posterior oropharyngeal erythema.  Eyes:     General: No scleral icterus.    Conjunctiva/sclera: Conjunctivae normal.  Neck:     Musculoskeletal: Normal range of motion and neck supple. No muscular tenderness.     Thyroid: No thyroid mass, thyromegaly or thyroid tenderness.  Cardiovascular:     Rate and Rhythm: Normal rate and regular rhythm.     Heart sounds: Normal heart sounds. No murmur. No friction rub. No gallop.   Pulmonary:     Effort: Pulmonary effort is normal.     Breath sounds: No stridor. No wheezing,  rhonchi or rales.  Abdominal:     General: Bowel sounds are normal.     Palpations: There is no mass.     Tenderness: There is no guarding.     Hernia: No hernia is present.  Musculoskeletal: Normal range of motion.        General: No swelling.     Right lower leg: No edema.     Left lower leg: No edema.  Lymphadenopathy:     Cervical: No cervical adenopathy.  Skin:    General: Skin is warm and dry.     Coloration: Skin is not pale.     Findings: No erythema or rash.  Neurological:     General: No focal deficit present.     Mental Status: He is oriented to person, place, and time. Mental status is at baseline.     Lab Results  Component Value Date   WBC 4.7 06/01/2018   HGB 12.3 (L) 06/01/2018   HCT 37.0 (L) 06/01/2018   PLT 262.0 06/01/2018   GLUCOSE 127 (H) 06/01/2018   CHOL 186 06/01/2018   TRIG 202.0 (H) 06/01/2018   HDL 46.20 06/01/2018   LDLDIRECT 107.0 06/01/2018   LDLCALC UNABLE TO CALCULATE IF TRIGLYCERIDE OVER 400 mg/dL 03/23/2013   ALT 21 06/01/2018   AST 21 06/01/2018   NA 141 06/01/2018   K 3.8 06/01/2018   CL 105 06/01/2018   CREATININE 1.16 06/01/2018   BUN 15 06/01/2018   CO2 27 06/01/2018   TSH 10.32 (H) 06/02/2018   PSA 1.65 06/22/2017   INR 0.94 01/15/2018   HGBA1C 5.7 06/01/2018   MICROALBUR 14.4 (H) 06/01/2018    US Guided Needle Placement  Result Date: 01/15/2018 INDICATION: 8 cm right renal cyst EXAM: ULTRASOUND RIGHT UPPER POLE RENAL CYST ASPIRATION MEDICATIONS: 1% LIDOCAINE LOCAL ANESTHESIA/SEDATION: Fentanyl 50 mcg IV; Versed 1.0 mg IV Moderate Sedation Time:  6 MINUTES The patient was continuously monitored during the procedure by the interventional radiology nurse under my direct supervision. COMPLICATIONS: None immediate. PROCEDURE: Informed written consent was obtained from the patient after a thorough discussion of the procedural risks, benefits and alternatives. All questions were addressed. Maximal Sterile Barrier Technique was  utilized including caps, mask, sterile gowns, sterile gloves, sterile drape, hand hygiene and skin antiseptic. A timeout was performed prior to the initiation of the procedure. Previous imaging reviewed. Patient positioned prone. Localization ultrasound performed. The right kidney upper pole renal cyst was localized. Overlying skin marked. Under sterile conditions and local anesthesia, a 5 [place] Yueh sheath needle was advanced under direct ultrasound into the renal cyst. Needle position confirmed with ultrasound. Syringe aspiration yielded 135 cc cyst fluid sent for cytology. Postprocedure imaging demonstrates no hemorrhage or hematoma. Patient tolerated the aspiration well. IMPRESSION: Successful ultrasound 8 cm right upper pole renal cyst aspiration. Sent for cytology. Electronically Signed   By: Jerilynn Mages.  Shick M.D.   On: 01/15/2018 09:13    Assessment & Plan:   Mike Orozco was seen today for anemia, hypertension, hypothyroidism, hyperlipidemia and diabetes.  Diagnoses and all orders for this visit:  Essential hypertension, benign - His blood pressure is not well controlled due to noncompliance.  Will continue carvedilol at the current dose and will restart a thiazide diuretic and ARB. -     Comprehensive metabolic panel; Future -     carvedilol (COREG) 25 MG tablet; Take 1 tablet (25 mg total) by mouth 2 (two) times daily with a meal. -     hydrochlorothiazide (HYDRODIURIL) 25 MG tablet; Take 1 tablet (25 mg total) by mouth daily.  Type II diabetes mellitus with manifestations ([place])- His blood sugars are adequately well controlled but he has developed microalbuminuria.  I have asked him to start taking an ARB. -     Comprehensive metabolic panel; Future -     Hemoglobin A1c; Future -     Microalbumin / creatinine urine ratio; Future -     Ambulatory referral to Ophthalmology -     irbesartan (AVAPRO) 300 MG tablet; Take 1 tablet (300 mg total) by mouth daily.  Chronic renal impairment, stage 2 (mild)-  His renal function is normal now. -     Comprehensive metabolic panel; Future -     Urinalysis, Routine w reflex microscopic; Future  Hyperlipidemia with target LDL less than 100- He has an elevated ASCVD risk score so I have asked him to start taking a statin for CV risk reduction. -     Lipid panel; Future -     atorvastatin (LIPITOR) 40 MG tablet; Take 1 tablet (40 mg total) by mouth daily.  Hypertriglyceridemia- His triglycerides remain mildly elevated but not high enough to warrant medical therapy.  He was encouraged to improve his lifestyle modifications. -     Lipid panel; Future  Thiamine deficiency- His H&H remain low.  I have asked him to be more compliant with the B1 replacement therapy. -     CBC with Differential/Platelet; Future -     Vitamin B1; Future -     thiamine (VITAMIN B-1) 50 MG tablet; Take 0.5 tablets (25 mg total) by mouth daily.  TSH elevation -     TSH; Future  Anemia of chronic disease -     CBC with Differential/Platelet; Future  Idiopathic gout, unspecified chronicity, unspecified site- His uric acid remains above 7 so I have asked him to start taking allopurinol to reduce the risk of future gout attacks. -     Uric acid; Future -     allopurinol (ZYLOPRIM) 100 MG tablet; Take 1 tablet (100 mg total) by mouth daily.  Chronic systolic heart failure ([place]) -     Ambulatory referral to Cardiology -     carvedilol (COREG) 25 MG tablet; Take 1 tablet (25 mg total) by mouth 2 (two) times daily with a meal. -     irbesartan (AVAPRO) 300 MG tablet; Take 1 tablet (300 mg total) by mouth daily.  Type 2 diabetes mellitus with complication, without long-term current use of insulin (HCC) -     irbesartan (AVAPRO) 300 MG tablet; Take 1 tablet (300 mg total) by mouth daily. -     atorvastatin (LIPITOR) 40 MG tablet; Take 1 tablet (40 mg total) by mouth daily.  Hypothyroidism (acquired)- His TSH is up to 10.  He has developed hypothyroidism.  I have asked him to start  taking a  thyroid supplement. -     levothyroxine (SYNTHROID, LEVOTHROID) 50 MCG tablet; Take 1 tablet (50 mcg total) by mouth daily before breakfast.   I have discontinued Graden Keziah's cetirizine, telmisartan, and traMADol. I have changed his chlorthalidone to hydrochlorothiazide. I have also changed his thiamine. Additionally, I am having him start on irbesartan, levothyroxine, and allopurinol. Lastly, I am having him maintain his aspirin EC, carvedilol, and atorvastatin.  Meds ordered this encounter  Medications  . carvedilol (COREG) 25 MG tablet    Sig: Take 1 tablet (25 mg total) by mouth 2 (two) times daily with a meal.    Dispense:  180 tablet    Refill:  0  . hydrochlorothiazide (HYDRODIURIL) 25 MG tablet    Sig: Take 1 tablet (25 mg total) by mouth daily.    Dispense:  90 tablet    Refill:  0  . irbesartan (AVAPRO) 300 MG tablet    Sig: Take 1 tablet (300 mg total) by mouth daily.    Dispense:  90 tablet    Refill:  0  . atorvastatin (LIPITOR) 40 MG tablet    Sig: Take 1 tablet (40 mg total) by mouth daily.    Dispense:  90 tablet    Refill:  1  . thiamine (VITAMIN B-1) 50 MG tablet    Sig: Take 0.5 tablets (25 mg total) by mouth daily.    Dispense:  45 tablet    Refill:  0  . levothyroxine (SYNTHROID, LEVOTHROID) 50 MCG tablet    Sig: Take 1 tablet (50 mcg total) by mouth daily before breakfast.    Dispense:  90 tablet    Refill:  0  . allopurinol (ZYLOPRIM) 100 MG tablet    Sig: Take 1 tablet (100 mg total) by mouth daily.    Dispense:  90 tablet    Refill:  0     Follow-up: Return in about 6 weeks (around 07/13/2018).  Scarlette Calico, MD

## 2018-06-01 NOTE — Patient Instructions (Signed)

## 2018-06-02 ENCOUNTER — Other Ambulatory Visit (INDEPENDENT_AMBULATORY_CARE_PROVIDER_SITE_OTHER): Payer: Managed Care, Other (non HMO)

## 2018-06-02 ENCOUNTER — Encounter: Payer: Self-pay | Admitting: Internal Medicine

## 2018-06-02 DIAGNOSIS — R7989 Other specified abnormal findings of blood chemistry: Secondary | ICD-10-CM

## 2018-06-02 DIAGNOSIS — E039 Hypothyroidism, unspecified: Secondary | ICD-10-CM | POA: Insufficient documentation

## 2018-06-02 LAB — TSH: TSH: 10.32 u[IU]/mL — ABNORMAL HIGH (ref 0.35–4.50)

## 2018-06-02 MED ORDER — LEVOTHYROXINE SODIUM 50 MCG PO TABS: 50.0000 ug | ORAL_TABLET | Freq: Every day | ORAL | 0 refills | Status: DC

## 2018-06-02 MED ORDER — ALLOPURINOL 100 MG PO TABS: 100.0000 mg | ORAL_TABLET | Freq: Every day | ORAL | 0 refills | Status: DC

## 2018-06-04 LAB — VITAMIN B1: Vitamin B1 (Thiamine): <6 nmol/L — ABNORMAL LOW (ref 8–30)

## 2018-06-05 ENCOUNTER — Other Ambulatory Visit: Payer: Self-pay | Admitting: Internal Medicine

## 2018-06-05 ENCOUNTER — Encounter: Payer: Self-pay | Admitting: Internal Medicine

## 2018-06-05 DIAGNOSIS — E519 Thiamine deficiency, unspecified: Secondary | ICD-10-CM

## 2018-06-05 MED ORDER — VITAMIN B-1 50 MG PO TABS: 50.0000 mg | ORAL_TABLET | Freq: Every day | ORAL | 1 refills | Status: DC

## 2018-06-10 ENCOUNTER — Encounter: Payer: Self-pay | Admitting: Cardiology

## 2018-06-21 NOTE — Progress Notes (Signed)
HPI:  FU nonischemic cardiomyopathy with EF initially at 15-20%. Cardiac catheterization in 03/2010 revealed normal coronary arteries. Last echocardiogram April 2018 showed ejection fraction 45 to 50% and mild aortic insufficiency.  Since then, the patient denies any dyspnea on exertion, orthopnea, PND, pedal edema, palpitations, syncope or chest pain.   Current Outpatient Medications  Medication Sig Dispense Refill  . allopurinol (ZYLOPRIM) 100 MG tablet Take 1 tablet (100 mg total) by mouth daily. 90 tablet 0  . aspirin EC 81 MG tablet Take 1 tablet (81 mg total) by mouth daily. 30 tablet 0  . atorvastatin (LIPITOR) 40 MG tablet Take 1 tablet (40 mg total) by mouth daily. 90 tablet 1  . carvedilol (COREG) 25 MG tablet Take 1 tablet (25 mg total) by mouth 2 (two) times daily with a meal. 180 tablet 0  . hydrochlorothiazide (HYDRODIURIL) 25 MG tablet Take 1 tablet (25 mg total) by mouth daily. 90 tablet 0  . irbesartan (AVAPRO) 300 MG tablet Take 1 tablet (300 mg total) by mouth daily. 90 tablet 0  . levothyroxine (SYNTHROID, LEVOTHROID) 50 MCG tablet Take 1 tablet (50 mcg total) by mouth daily before breakfast. 90 tablet 0  . thiamine (VITAMIN B-1) 50 MG tablet Take 1 tablet (50 mg total) by mouth daily. 90 tablet 1   No current facility-administered medications for this visit.      Past Medical History:  Diagnosis Date  . Anemia   . Arthritis   . CHF (congestive heart failure) ([place])   . Chronic renal insufficiency baseline CRE  1.5--1.8   NEPHROLOGIST--  DR WEBB ([facility])  . Diabetes mellitus type 2, insulin dependent (H[facility]) PER pt and LAST PCP NOTE CBG'S IN 100'   RECENTLY UNCONTROLLED AIC 07-07-2012  9.4 AND CBG'S 400'S  . GERD (gastroesophageal reflux disease)   . Gout, unspecified    stable  per pt  . Heart block AV first degree   . Heart failure, chronic systolic ([place])   . History of ETOH abuse   . Hypertension    CARDIOLOGIST-  DR Stanford Breed--- [facility] IN [facility]  09-08-2011  . Nephrolithiasis    RIGHT  . Non-ischemic cardiomyopathy ([place]) SECONDARY TO HX UNCONTROLLED HTN AND ETOH ABUSE----  LAST ECHO EF 50%  09-22-2011   a) Echo 03/25/2010: EF 20-25%; mild AI; Mod MR; Mild LAE  b)cardiac cath 03/26/2010: normal cors; severe pulmonary HTN;PCWP 41; EF 10-15%;  c. echo 4/12:  EF 45-50%, mild LVH, grade 1 diast dysfxn, mild AI, mild LAE  . Personal history of colonic polyps-adenoma 02/17/2012   01/2012 - diminutive adenoma Repeat colon about 01/2017    . Polycystic kidney disease    BILATERAL RENAL CYST  . Pure hyperglyceridemia     Past Surgical History:  Procedure Laterality Date  . CARDIAC CATHETERIZATION  03-26-2010  DR [place]   SEVERE LV DYSFUNCTION WITH MARKEDLY ELEVATED FILLING PRESSURE/ SEVERE PULMONARY HYPERTENSION/ NORMAL CORONARY ANATOMY  . colonoscopy    . COLONOSCOPY  2013  . CYSTOSCOPY WITH RETROGRADE PYELOGRAM, URETEROSCOPY AND STENT PLACEMENT Right 08/18/2012   Procedure: CYSTOSCOPY WITH RETROGRADE PYELOGRAM, URETEROSCOPY AND STENT PLACEMENT;  Surgeon: Molli Hazard, MD;  Location: [facility];  Service: Urology;  Laterality: Right;  . KIDNEY STONE SURGERY     X 2  . KNEE ARTHROSCOPY W/ MENISCECTOMY Left 02-06-2011   AND CHONDROPLASTY  . LUMBAR FUSION  08-10-2003   L5  --  S1  . NEPHROLITHOTOMY Right 09/15/2012   Procedure:  NEPHROLITHOTOMY PERCUTANEOUS;  Surgeon: Molli Hazard, MD;  Location: [facility];  Service: Urology;  Laterality: Right;  RIGHT PERCUTANEOUS NEPHROSTOLITHOTOMY   . REMOVAL CYST LEFT LUNG  1994   AT [facility]   benign  . RHINOPLASTY  1970'S   nose fx  . TRANSTHORACIC ECHOCARDIOGRAM  09-22-2011  DR Miryam Mcelhinney   EF 50%/  MILD LVH/ GRADE I DIASTOLIC DYSFUNCTION/ MILD TR    Social History   Socioeconomic History  . Marital status: Married    Spouse name: Not on file  . Number of children: 1  . Years of education: Not on file  . Highest education level: Not on file  Occupational History  .  Occupation: [employer]    Employer: [place]: [employer]  . Financial resource strain: Not on file  . Food insecurity:    Worry: Not on file    Inability: Not on file  . Transportation needs:    Medical: Not on file    Non-medical: Not on file  Tobacco Use  . Smoking status: Current Every Day Smoker    Packs/day: 0.00    Years: 32.00    Pack years: 0.00    Types: Cigars    Last attempt to quit: 02/27/2007    Years since quitting: 11.3  . Smokeless tobacco: Never Used  Substance and Sexual Activity  . Alcohol use: Yes    Alcohol/week: 21.0 standard drinks    Types: 21 Cans of beer per week    Comment: HX ALCOHOL ABUSE--  pt states he drinks 2 beers a day  . Drug use: No  . Sexual activity: Not Currently  Lifestyle  . Physical activity:    Days per week: Not on file    Minutes per session: Not on file  . Stress: Not on file  Relationships  . Social connections:    Talks on phone: Not on file    Gets together: Not on file    Attends religious service: Not on file    Active member of club or organization: Not on file    Attends meetings of clubs or organizations: Not on file    Relationship status: Not on file  . Intimate partner violence:    Fear of current or ex partner: Not on file    Emotionally abused: Not on file    Physically abused: Not on file    Forced sexual activity: Not on file  Other Topics Concern  . Not on file  Social History Narrative   No regular exercise   Daily caffeine       Family History  Problem Relation Age of Onset  . Diabetes Mother   . Hypertension Mother   . Diabetes Father   . Hypertension Father   . Colon cancer Paternal Grandfather     ROS: no fevers or chills, productive cough, hemoptysis, dysphasia, odynophagia, melena, hematochezia, dysuria, hematuria, rash, seizure activity, orthopnea, PND, pedal edema, claudication. Remaining systems are negative.  Physical Exam: Well-developed  well-nourished in no acute distress.  Skin is warm and dry.  HEENT is normal.  Neck is supple.  Chest is clear to auscultation with normal expansion.  Cardiovascular exam is regular rate and rhythm.  Abdominal exam nontender or distended. No masses palpated. Extremities show no edema. neuro grossly intact  ECG-normal sinus rhythm at a rate of 60, left ventricular hypertrophy.  Personally reviewed  A/P  1 nonischemic cardiomyopathy-LV function improved on most recent echocardiogram.  Plan to repeat.  Continue beta-blocker and ARB.  2 hypertension-patient's blood pressure is elevated.  Add amlodipine 5 mg daily and follow.  3 history of renal insufficiency-laboratories reviewed and most recent creatinine 1.16.  Has been followed by nephrology in the past.  Kirk Ruths, MD

## 2018-06-30 ENCOUNTER — Ambulatory Visit (INDEPENDENT_AMBULATORY_CARE_PROVIDER_SITE_OTHER): Payer: Managed Care, Other (non HMO) | Admitting: Cardiology

## 2018-06-30 ENCOUNTER — Encounter: Payer: Self-pay | Admitting: Cardiology

## 2018-06-30 VITALS — BP 150/100 | HR 60 | Ht 66.0 in | Wt 179.8 lb

## 2018-06-30 DIAGNOSIS — I10 Essential (primary) hypertension: Secondary | ICD-10-CM

## 2018-06-30 DIAGNOSIS — I428 Other cardiomyopathies: Secondary | ICD-10-CM | POA: Diagnosis not present

## 2018-06-30 MED ORDER — AMLODIPINE BESYLATE 5 MG PO TABS: 5.0000 mg | ORAL_TABLET | Freq: Every day | ORAL | 3 refills | Status: DC

## 2018-06-30 NOTE — Patient Instructions (Signed)
Medication Instructions:   START AMLODIPINE 5 MG ONCE DAILY  Testing/Procedures:  Your physician has requested that you have an echocardiogram. Echocardiography is a painless test that uses sound waves to create images of your heart. It provides your doctor with information about the size and shape of your heart and how well your heart's chambers and valves are working. This procedure takes approximately one hour. There are no restrictions for this procedure.  [facility]  Follow-Up:  Your physician recommends that you schedule a follow-up appointment in: [place]

## 2018-07-05 ENCOUNTER — Other Ambulatory Visit: Payer: Self-pay

## 2018-07-05 ENCOUNTER — Emergency Department (HOSPITAL_BASED_OUTPATIENT_CLINIC_OR_DEPARTMENT_OTHER): Payer: Managed Care, Other (non HMO)

## 2018-07-05 ENCOUNTER — Emergency Department (HOSPITAL_BASED_OUTPATIENT_CLINIC_OR_DEPARTMENT_OTHER)
Admission: EM | Admit: 2018-07-05 | Discharge: 2018-07-05 | Disposition: A | Discharge status: Home / Self Care | Payer: Managed Care, Other (non HMO) | Attending: Emergency Medicine | Admitting: Emergency Medicine

## 2018-07-05 ENCOUNTER — Encounter (HOSPITAL_BASED_OUTPATIENT_CLINIC_OR_DEPARTMENT_OTHER): Payer: Self-pay | Admitting: Emergency Medicine

## 2018-07-05 DIAGNOSIS — E119 Type 2 diabetes mellitus without complications: Secondary | ICD-10-CM | POA: Insufficient documentation

## 2018-07-05 DIAGNOSIS — I5022 Chronic systolic (congestive) heart failure: Secondary | ICD-10-CM | POA: Insufficient documentation

## 2018-07-05 DIAGNOSIS — Z7982 Long term (current) use of aspirin: Secondary | ICD-10-CM | POA: Diagnosis not present

## 2018-07-05 DIAGNOSIS — M25562 Pain in left knee: Secondary | ICD-10-CM | POA: Insufficient documentation

## 2018-07-05 DIAGNOSIS — F17290 Nicotine dependence, other tobacco product, uncomplicated: Secondary | ICD-10-CM | POA: Diagnosis not present

## 2018-07-05 DIAGNOSIS — I110 Hypertensive heart disease with heart failure: Secondary | ICD-10-CM | POA: Diagnosis not present

## 2018-07-05 DIAGNOSIS — E039 Hypothyroidism, unspecified: Secondary | ICD-10-CM | POA: Diagnosis not present

## 2018-07-05 DIAGNOSIS — Z79899 Other long term (current) drug therapy: Secondary | ICD-10-CM | POA: Insufficient documentation

## 2018-07-05 DIAGNOSIS — R2242 Localized swelling, mass and lump, left lower limb: Secondary | ICD-10-CM | POA: Insufficient documentation

## 2018-07-05 NOTE — ED Provider Notes (Signed)
[facility] EMERGENCY DEPARTMENT Provider Note   CSN: [id number] Arrival date & time: 07/05/18  1033     History   Chief Complaint Chief Complaint  Patient presents with  . Knee Pain    HPI Mike Orozco is a 58 y.o. male with a medical history significant for CHF, chronic renal insufficiency, hypertension presenting to emergency department today with chief complaint of left knee pain x2 weeks.  Patient denies injury to the knee.  He describes the pain as an ache located in his kneecap and radiating to the back of his knee.  He had been taking ibuprofen and Tylenol with minimal relief, however after seeing his cardiologist recently he was told not to take ibuprofen so he has since stopped.  The pain is constant and worse with movement.  Patient works on his [employer] and says by the end of his 9-hour shift he has significant swelling.  He is attempted to wrap the knee with an Ace wrap and also use knee pads when the pain is worse. Patient reports associated knot in the back of the knee.  This also has been present x2 weeks.  There is no pain associated with the knot.  He has not noticed this before.  Denies numbness, weakness, recent fall, injury.    Past Medical History:  Diagnosis Date  . Anemia   . Arthritis   . CHF (congestive heart failure) ([place])   . Chronic renal insufficiency baseline CRE  1.5--1.8   NEPHROLOGIST--  DR WEBB ([facility])  . Diabetes mellitus type 2, insulin dependent (HCC) PER pt and LAST PCP NOTE CBG'S IN 100'   RECENTLY UNCONTROLLED AIC 07-07-2012  9.4 AND CBG'S 400'S  . GERD (gastroesophageal reflux disease)   . Gout, unspecified    stable  per pt  . Heart block AV first degree   . Heart failure, chronic systolic ([place])   . History of ETOH abuse   . Hypertension    CARDIOLOGIST-  DR Stanford Breed--- LOV IN [facility] 09-08-2011  . Nephrolithiasis    RIGHT  . Non-ischemic cardiomyopathy ([place]) SECONDARY TO HX UNCONTROLLED HTN AND  ETOH ABUSE----  LAST ECHO EF 50%  09-22-2011   a) Echo 03/25/2010: EF 20-25%; mild AI; Mod MR; Mild LAE  b)cardiac cath 03/26/2010: normal cors; severe pulmonary HTN;PCWP 41; EF 10-15%;  c. echo 4/12:  EF 45-50%, mild LVH, grade 1 diast dysfxn, mild AI, mild LAE  . Personal history of colonic polyps-adenoma 02/17/2012   01/2012 - diminutive adenoma Repeat colon about 01/2017    . Polycystic kidney disease    BILATERAL RENAL CYST  . Pure hyperglyceridemia     Patient Active Problem List   Diagnosis Date Noted  . Hypothyroidism (acquired) 06/02/2018  . Thiamine deficiency 06/29/2017  . Hyperlipidemia with target LDL less than 100 11/13/2015  . TSH elevation 11/13/2015  . Routine general medical examination at a health care facility 11/13/2015  . Hypertriglyceridemia 03/22/2013  . Ventral hernia 03/21/2013  . Type II diabetes mellitus with manifestations ([place]) 07/12/2012  . Personal history of colonic polyps-adenoma 02/17/2012  . Chronic renal insufficiency 10/27/2011  . Gout 05/28/2011  . Anemia of chronic disease 12/16/2010  . Kidney stones 12/16/2010  . Low back pain 08/19/2010  . Allergic rhinitis due to other allergen 06/17/2010  . CARDIOMYOPATHY 04/03/2010  . Chronic systolic heart failure ([place]) 04/03/2010  . Essential hypertension, benign 04/02/2010    Past Surgical History:  Procedure Laterality Date  . CARDIAC CATHETERIZATION  03-26-2010  DR Martinique   SEVERE LV DYSFUNCTION WITH MARKEDLY ELEVATED FILLING PRESSURE/ SEVERE PULMONARY HYPERTENSION/ NORMAL CORONARY ANATOMY  . colonoscopy    . COLONOSCOPY  2013  . CYSTOSCOPY WITH RETROGRADE PYELOGRAM, URETEROSCOPY AND STENT PLACEMENT Right 08/18/2012   Procedure: CYSTOSCOPY WITH RETROGRADE PYELOGRAM, URETEROSCOPY AND STENT PLACEMENT;  Surgeon: Molli Hazard, MD;  Location: [facility];  Service: Urology;  Laterality: Right;  . KIDNEY STONE SURGERY     X 2  . KNEE ARTHROSCOPY W/ MENISCECTOMY Left 02-06-2011    AND CHONDROPLASTY  . LUMBAR FUSION  08-10-2003   L5  --  S1  . NEPHROLITHOTOMY Right 09/15/2012   Procedure: NEPHROLITHOTOMY PERCUTANEOUS;  Surgeon: Molli Hazard, MD;  Location: [facility];  Service: Urology;  Laterality: Right;  RIGHT PERCUTANEOUS NEPHROSTOLITHOTOMY   . REMOVAL CYST LEFT LUNG  1994   AT [facility]   benign  . RHINOPLASTY  1970'S   nose fx  . TRANSTHORACIC ECHOCARDIOGRAM  09-22-2011  DR CRENSHAW   EF 50%/  MILD LVH/ GRADE I DIASTOLIC DYSFUNCTION/ MILD TR        Home Medications    Prior to Admission medications   Medication Sig Start Date End Date Taking? Authorizing Provider  allopurinol (ZYLOPRIM) 100 MG tablet Take 1 tablet (100 mg total) by mouth daily. 06/02/18   Janith Lima, MD  amLODipine (NORVASC) 5 MG tablet Take 1 tablet (5 mg total) by mouth daily. 06/30/18 09/28/18  Lelon Perla, MD  aspirin EC 81 MG tablet Take 1 tablet (81 mg total) by mouth daily. 12/24/17   Janith Lima, MD  atorvastatin (LIPITOR) 40 MG tablet Take 1 tablet (40 mg total) by mouth daily. 06/01/18   Janith Lima, MD  carvedilol (COREG) 25 MG tablet Take 1 tablet (25 mg total) by mouth 2 (two) times daily with a meal. 06/01/18   Janith Lima, MD  hydrochlorothiazide (HYDRODIURIL) 25 MG tablet Take 1 tablet (25 mg total) by mouth daily. 06/01/18   Janith Lima, MD  irbesartan (AVAPRO) 300 MG tablet Take 1 tablet (300 mg total) by mouth daily. 06/01/18   Janith Lima, MD  levothyroxine (SYNTHROID, LEVOTHROID) 50 MCG tablet Take 1 tablet (50 mcg total) by mouth daily before breakfast. 06/02/18   Janith Lima, MD  thiamine (VITAMIN B-1) 50 MG tablet Take 1 tablet (50 mg total) by mouth daily. 06/05/18   Janith Lima, MD    Family History Family History  Problem Relation Age of Onset  . Diabetes Mother   . Hypertension Mother   . Diabetes Father   . Hypertension Father   . Colon cancer Paternal Grandfather     Social History Social History   Tobacco Use  .  Smoking status: Current Every Day Smoker    Packs/day: 0.00    Years: 32.00    Pack years: 0.00    Types: Cigars    Last attempt to quit: 02/27/2007    Years since quitting: 11.3  . Smokeless tobacco: Never Used  Substance Use Topics  . Alcohol use: Yes    Alcohol/week: 21.0 standard drinks    Types: 21 Cans of beer per week    Comment: HX ALCOHOL ABUSE--  pt states he drinks 2 beers a day  . Drug use: No     Allergies   Lisinopril; Metformin; and Metformin and related   Review of Systems Review of Systems  Musculoskeletal: Positive for arthralgias, joint swelling and myalgias. Negative  for back pain and gait problem.  All other systems reviewed and are negative.    Physical Exam Updated Vital Signs BP 121/68 (BP Location: Right Arm)   Pulse 85   Temp 97.8 F (36.6 C) (Oral)   Resp 14   Ht 5\' 6"  (1.676 m)   Wt 78.5 kg   SpO2 94%   BMI 27.92 kg/m   Physical Exam Vitals signs and nursing note reviewed.  Constitutional:      Appearance: He is not ill-appearing or toxic-appearing.  HENT:     Head: Normocephalic and atraumatic.     Nose: Nose normal.     Mouth/Throat:     Mouth: Mucous membranes are moist.     Pharynx: Oropharynx is clear.  Eyes:     Conjunctiva/sclera: Conjunctivae normal.  Neck:     Musculoskeletal: Normal range of motion.  Cardiovascular:     Rate and Rhythm: Normal rate and regular rhythm.     Pulses: Normal pulses.          Dorsalis pedis pulses are 2+ on the right side and 2+ on the left side.     Heart sounds: Normal heart sounds.  Pulmonary:     Effort: Pulmonary effort is normal.     Breath sounds: Normal breath sounds.  Abdominal:     General: There is no distension.     Palpations: Abdomen is soft.     Tenderness: There is no abdominal tenderness. There is no guarding or rebound.  Musculoskeletal: Normal range of motion.     Comments: Mild swelling of the left knee. Mild effusion. Good color and capillary refill. Full range of  motion of left knee. Full range of motion of left hip in all directions without pain. Non tender to palpation of medial joint line of left knee. There is what feels like a cyst on posterior aspect of left knee moves freely with palpation.  Skin:    General: Skin is warm and dry.     Capillary Refill: Capillary refill takes less than 2 seconds.  Neurological:     Mental Status: He is alert. Mental status is at baseline.     Motor: No weakness.  Psychiatric:        Behavior: Behavior normal.      ED Treatments / Results  Labs (all labs ordered are listed, but only abnormal results are displayed) Labs Reviewed - No data to display  EKG None  Radiology US Venous Img Lower  Left (dvt Study)  Result Date: 07/05/2018 CLINICAL DATA:  58 year old male with a history of swelling EXAM: LEFT LOWER EXTREMITY VENOUS DOPPLER ULTRASOUND TECHNIQUE: Gray-scale sonography with graded compression, as well as color Doppler and duplex ultrasound were performed to evaluate the lower extremity deep venous systems from the level of the common femoral vein and including the common femoral, femoral, profunda femoral, popliteal and calf veins including the posterior tibial, peroneal and gastrocnemius veins when visible. The superficial great saphenous vein was also interrogated. Spectral Doppler was utilized to evaluate flow at rest and with distal augmentation maneuvers in the common femoral, femoral and popliteal veins. COMPARISON:  None. FINDINGS: Contralateral Common Femoral Vein: Respiratory phasicity is normal and symmetric with the symptomatic side. No evidence of thrombus. Normal compressibility. Common Femoral Vein: No evidence of thrombus. Normal compressibility, respiratory phasicity and response to augmentation. Saphenofemoral Junction: No evidence of thrombus. Normal compressibility and flow on color Doppler imaging. Profunda Femoral Vein: No evidence of thrombus. Normal compressibility and flow on  color  Doppler imaging. Femoral Vein: No evidence of thrombus. Normal compressibility, respiratory phasicity and response to augmentation. Popliteal Vein: No evidence of thrombus. Normal compressibility, respiratory phasicity and response to augmentation. Calf Veins: No evidence of thrombus. Normal compressibility and flow on color Doppler imaging. Superficial Great Saphenous Vein: No evidence of thrombus. Normal compressibility and flow on color Doppler imaging. Other Findings: Lentiform and redundant anechoic fluid in the left popliteal region measures 6.3 cm. IMPRESSION: Sonographic survey of the left lower extremity negative for DVT. Redundant and lentiform fluid in the left popliteal region is most likely a Baker's cyst. Electronically Signed   By: Corrie Mckusick D.O.   On: 07/05/2018 13:09   Dg Knee Complete 4 Views Left  Result Date: 07/05/2018 CLINICAL DATA:  Acute left knee pain without known injury. EXAM: LEFT KNEE - COMPLETE 4+ VIEW COMPARISON:  Radiographs of September 10, 2010. FINDINGS: No evidence of fracture, dislocation, or joint effusion. Mild narrowing of medial joint space is noted with osteophyte formation. Mild osteophyte formation is seen involving patella. Mild osteophyte formation is noted laterally as well. Soft tissues are unremarkable. IMPRESSION: Mild degenerative joint disease. No acute abnormality seen in the left knee. Electronically Signed   By: Marijo Conception, M.D.   On: 07/05/2018 11:41    Procedures Procedures (including critical care time)  Medications Ordered in ED Medications - No data to display   Initial Impression / Assessment and Plan / ED Course  I have reviewed the triage vital signs and the nursing notes.  Pertinent labs & imaging results that were available during my care of the patient were reviewed by me and considered in my medical decision making (see chart for details).    Patient presents to the ED with complaints of pain to the left knee pain. Exam  without obvious deformity or open wounds. ROM intact. Tender to palpation in posterior aspect of knee and it seems like there is a palpable cyst  NVI distally.  I viewed his left knee x-ray and it is negative for fracture or dislocation. It does show degenerative disc disease. The [place] of left leg shows fluid in the left popliteal region that is most likely a Baker's cyst. [place] is negative for DVT. Recommend patient follow-up with Dr. Barbaraann Barthel, sports medicine for further evaluation of this possible Baker's cyst.    I viewed his EMR showing that he had his BMP checked 1 month ago and his kidney function was within normal limits.  Discussed that patient can take ibuprofen for a couple days as that has helped with the pain or he can take Tylenol.  He should only take ibuprofen for shortest duration needed. Discussed results, treatment plan, need for follow-up, and return precautions with the patient. Provided opportunity for questions, patient confirmed understanding and are in agreement with plan. Pt case discussed with Dr. Rogene Houston who agrees with my plan.    Final Clinical Impressions(s) / ED Diagnoses   Final diagnoses:  Left knee pain, unspecified chronicity    ED Discharge Orders    None       Cherre Robins, PA-C 07/06/18 0130    Fredia Sorrow, MD 07/07/18 1303

## 2018-07-05 NOTE — ED Triage Notes (Signed)
Pain all around left knee for a few weeks with no known injury.

## 2018-07-05 NOTE — Discharge Instructions (Signed)
You have been seen today for left knee pain. Please read and follow all provided instructions. Return to the emergency room for worsening condition or new concerning symptoms.   Ultrasound shows you have a Baker's cyst, this is the knot you are feeling behind your left knee.  Please call today to schedule follow-up with Dr. Barbaraann Barthel, he is a sports medicine doctor located in the same building as the emergency department.  1. Medications:  Continue usual home medications.  Take Tylenol and ibuprofen for pain, please take as directed. Take medications as prescribed. Please review all of the medicines and only take them if you do not have an allergy to them.  2. Treatment: rest, drink plenty of fluids 3. Follow Up: Please follow up with your primary doctor in 2-5 days for discussion of your diagnoses and further evaluation after today's visit; Call today to arrange your follow up.  If you do not have a primary care doctor use the resource guide provided to find one;   ?

## 2018-07-05 NOTE — ED Notes (Signed)
Patient transported to X-ray 

## 2018-07-06 ENCOUNTER — Ambulatory Visit (INDEPENDENT_AMBULATORY_CARE_PROVIDER_SITE_OTHER): Payer: Managed Care, Other (non HMO) | Admitting: Family Medicine

## 2018-07-06 ENCOUNTER — Encounter: Payer: Self-pay | Admitting: Family Medicine

## 2018-07-06 VITALS — BP 160/90 | Ht 66.0 in | Wt 173.0 lb

## 2018-07-06 DIAGNOSIS — M25562 Pain in left knee: Secondary | ICD-10-CM | POA: Diagnosis not present

## 2018-07-06 MED ORDER — METHYLPREDNISOLONE ACETATE 40 MG/ML IJ SUSP
40.0000 mg | Freq: Once | INTRAMUSCULAR | Status: AC
  Administered 2018-07-06: 40 mg via INTRA_ARTICULAR

## 2018-07-06 NOTE — Patient Instructions (Signed)
Your pain is due to arthritis. These are the different medications you can take for this: Tylenol 500mg  1-2 tabs three times a day for pain. Capsaicin, aspercreme, or biofreeze topically up to four times a day may also help with pain. Some supplements that may help for arthritis: Boswellia extract, curcumin, pycnogenol I would avoid aleve and ibuprofen if possible Cortisone injections are an option - you were given this today after aspirating your knee. If cortisone injections do not help, there are different types of shots that may help but they take longer to take effect. It's important that you continue to stay active. Straight leg raises, knee extensions 3 sets of 10 once a day (add ankle weight if these become too easy). Consider physical therapy to strengthen muscles around the joint that hurts to take pressure off of the joint itself. Shoe inserts with good arch support may be helpful. Ice 15 minutes at a time 3-4 times a day as needed to help with pain. Compression sleeve when up and walking around. Water aerobics and cycling with low resistance are the best two types of exercise for arthritis though any exercise is ok as long as it doesn't worsen the pain. Follow up with me in 1 month but if you're struggling with pain still 1-2 weeks from now we can consider doing a similar thing to the baker's cyst directly - this is usually not necessary because the cyst communicates with the joint in most cases.

## 2018-07-07 ENCOUNTER — Encounter: Payer: Self-pay | Admitting: Family Medicine

## 2018-07-07 NOTE — Progress Notes (Signed)
PCP: Janith Lima, MD  Subjective:   HPI: Patient is a 58 y.o. male here for left knee pain.  Patient denies acute injury or trauma. 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 A lot of swelling with this. Pain from anterior to posterior knee at 8/10 level, sharp. Using a copper sleeve brace, icing/heating, taking ibuprofen with mild benefit. No skin changes or numbness.  Past Medical History:  Diagnosis Date  . Anemia   . Arthritis   . CHF (congestive heart failure) ([place])   . Chronic renal insufficiency baseline CRE  1.5--1.8   NEPHROLOGIST--  DR WEBB ([facility])  . Diabetes mellitus type 2, insulin dependent (HCC) PER pt and LAST PCP NOTE CBG'S IN 100'   RECENTLY UNCONTROLLED AIC 07-07-2012  9.4 AND CBG'S 400'S  . GERD (gastroesophageal reflux disease)   . Gout, unspecified    stable  per pt  . Heart block AV first degree   . Heart failure, chronic systolic ([place])   . History of ETOH abuse   . Hypertension    CARDIOLOGIST-  DR Stanford Breed--- LOV IN [facility] 09-08-2011  . Nephrolithiasis    RIGHT  . Non-ischemic cardiomyopathy ([place]) SECONDARY TO HX UNCONTROLLED HTN AND ETOH ABUSE----  LAST ECHO EF 50%  09-22-2011   a) Echo 03/25/2010: EF 20-25%; mild AI; Mod MR; Mild LAE  b)cardiac cath 03/26/2010: normal cors; severe pulmonary HTN;PCWP 41; EF 10-15%;  c. echo 4/12:  EF 45-50%, mild LVH, grade 1 diast dysfxn, mild AI, mild LAE  . Personal history of colonic polyps-adenoma 02/17/2012   01/2012 - diminutive adenoma Repeat colon about 01/2017    . Polycystic kidney disease    BILATERAL RENAL CYST  . Pure hyperglyceridemia     Current Outpatient Medications on File Prior to Visit  Medication Sig Dispense Refill  . allopurinol (ZYLOPRIM) 100 MG tablet Take 1 tablet (100 mg total) by mouth daily. 90 tablet 0  . amLODipine (NORVASC) 5 MG tablet Take 1 tablet (5 mg total) by mouth daily. 90  tablet 3  . aspirin EC 81 MG tablet Take 1 tablet (81 mg total) by mouth daily. 30 tablet 0  . atorvastatin (LIPITOR) 40 MG tablet Take 1 tablet (40 mg total) by mouth daily. 90 tablet 1  . carvedilol (COREG) 25 MG tablet Take 1 tablet (25 mg total) by mouth 2 (two) times daily with a meal. 180 tablet 0  . hydrochlorothiazide (HYDRODIURIL) 25 MG tablet Take 1 tablet (25 mg total) by mouth daily. 90 tablet 0  . irbesartan (AVAPRO) 300 MG tablet Take 1 tablet (300 mg total) by mouth daily. 90 tablet 0  . levothyroxine (SYNTHROID, LEVOTHROID) 50 MCG tablet Take 1 tablet (50 mcg total) by mouth daily before breakfast. 90 tablet 0  . thiamine (VITAMIN B-1) 50 MG tablet Take 1 tablet (50 mg total) by mouth daily. 90 tablet 1   No current facility-administered medications on file prior to visit.     Past Surgical History:  Procedure Laterality Date  . CARDIAC CATHETERIZATION  03-26-2010  DR Martinique   SEVERE LV DYSFUNCTION WITH MARKEDLY ELEVATED FILLING PRESSURE/ SEVERE PULMONARY HYPERTENSION/ NORMAL CORONARY ANATOMY  . colonoscopy    . COLONOSCOPY  2013  . CYSTOSCOPY WITH RETROGRADE PYELOGRAM, URETEROSCOPY AND STENT PLACEMENT Right 08/18/2012   Procedure: CYSTOSCOPY WITH RETROGRADE PYELOGRAM, URETEROSCOPY AND STENT PLACEMENT;  Surgeon: Molli Hazard, MD;  Location: [facility];  Service: Urology;  Laterality: Right;  . KIDNEY STONE SURGERY     X 2  . KNEE ARTHROSCOPY W/ MENISCECTOMY Left 02-06-2011   AND CHONDROPLASTY  . LUMBAR FUSION  08-10-2003   L5  --  S1  . NEPHROLITHOTOMY Right 09/15/2012   Procedure: NEPHROLITHOTOMY PERCUTANEOUS;  Surgeon: Molli Hazard, MD;  Location: [facility];  Service: Urology;  Laterality: Right;  RIGHT PERCUTANEOUS NEPHROSTOLITHOTOMY   . REMOVAL CYST LEFT LUNG  1994   AT [facility]   benign  . RHINOPLASTY  1970'S   nose fx  . TRANSTHORACIC ECHOCARDIOGRAM  09-22-2011  DR CRENSHAW   EF 50%/  MILD LVH/ GRADE I DIASTOLIC DYSFUNCTION/ MILD TR     Allergies  Allergen Reactions  . Lisinopril Hives  . Metformin Other (See Comments)    Renal issues Renal issues  . Metformin And Related     Renal issues    Social History   Socioeconomic History  . Marital status: Married    Spouse name: Not on file  . Number of children: 1  . Years of education: Not on file  . Highest education level: Not on file  Occupational History  . Occupation: FORK LIFT    Employer: [employer]: [employer]  . Financial resource strain: Not on file  . Food insecurity:    Worry: Not on file    Inability: Not on file  . Transportation needs:    Medical: Not on file    Non-medical: Not on file  Tobacco Use  . Smoking status: Current Every Day Smoker    Packs/day: 0.00    Years: 32.00    Pack years: 0.00    Types: Cigars    Last attempt to quit: 02/27/2007    Years since quitting: 11.3  . Smokeless tobacco: Never Used  Substance and Sexual Activity  . Alcohol use: Yes    Alcohol/week: 21.0 standard drinks    Types: 21 Cans of beer per week    Comment: HX ALCOHOL ABUSE--  pt states he drinks 2 beers a day  . Drug use: No  . Sexual activity: Not Currently  Lifestyle  . Physical activity:    Days per week: Not on file    Minutes per session: Not on file  . Stress: Not on file  Relationships  . Social connections:    Talks on phone: Not on file    Gets together: Not on file    Attends religious service: Not on file    Active member of club or organization: Not on file    Attends meetings of clubs or organizations: Not on file    Relationship status: Not on file  . Intimate partner violence:    Fear of current or ex partner: Not on file    Emotionally abused: Not on file    Physically abused: Not on file    Forced sexual activity: Not on file  Other Topics Concern  . Not on file  Social History Narrative   No regular exercise   Daily caffeine       Family History  Problem Relation Age of  Onset  . Diabetes Mother   . Hypertension Mother   . Diabetes Father   . Hypertension Father   . Colon cancer Paternal Grandfather     BP (!) 160/90   Ht 5\' 6"  (1.676 m)   Wt 173 lb (78.5 kg)  BMI 27.92 kg/m   Review of Systems: See HPI above.     Objective:  Physical Exam:  Gen: NAD, comfortable in exam room  Left knee: Mod effusion with palpable bakers cyst.  No other gross deformity, ecchymoses. Mild TTP medial and lateral joint lines, popliteal fossa. FROM with 5/5 strength flexion and extension. Negative ant/post drawers. Negative valgus/varus testing. Negative lachmans. Negative mcmurrays, apleys, patellar apprehension. NV intact distally.  Right knee: No deformity. FROM with 5/5 strength. No tenderness to palpation. NVI distally.   Assessment & Plan:  1. Left knee pain - 2/2 arthritis causing effusion and large baker's cyst.  Independently reviewed radiographs showing degenerative changes.  Exam otherwise reassuring.  Discussed options - went ahead with aspiration/injection of knee - discussed communication of most cysts with the knee joint.  Tylenol, topical medications, supplements reviewed.  Icing, compression, elevation.  F/u in 1 month.  After informed written consent timeout was performed, patient was lying supine on exam table.  Left knee was prepped with alcohol swab.  Utilizing superolateral approach with ultrasound guidance, 3 mL of marcaine was used for local anesthesia.  Then using an 18g needle on 60cc syringe, 59 mL of clear straw-colored fluid was aspirated from left knee.  Knee was then injected with 3:1 bupivicaine:depomedrol.  Patient tolerated procedure well without immediate complications.

## 2018-07-09 ENCOUNTER — Ambulatory Visit (HOSPITAL_BASED_OUTPATIENT_CLINIC_OR_DEPARTMENT_OTHER)
Admission: RE | Admit: 2018-07-09 | Discharge: 2018-07-09 | Disposition: A | Discharge status: Home / Self Care | Payer: Managed Care, Other (non HMO) | Source: Ambulatory Visit | Attending: Cardiology | Admitting: Cardiology

## 2018-07-09 DIAGNOSIS — I428 Other cardiomyopathies: Secondary | ICD-10-CM | POA: Insufficient documentation

## 2018-07-09 NOTE — Progress Notes (Signed)
  Echocardiogram 2D Echocardiogram has been performed.  Oak Dorey T Nozomi Mettler 07/09/2018, 1:50 PM

## 2018-09-01 ENCOUNTER — Encounter: Payer: Self-pay | Admitting: Cardiology

## 2018-09-01 ENCOUNTER — Telehealth (INDEPENDENT_AMBULATORY_CARE_PROVIDER_SITE_OTHER): Payer: Managed Care, Other (non HMO) | Admitting: Cardiology

## 2018-09-01 VITALS — Ht 66.0 in | Wt 183.0 lb

## 2018-09-01 DIAGNOSIS — I428 Other cardiomyopathies: Secondary | ICD-10-CM | POA: Diagnosis not present

## 2018-09-01 DIAGNOSIS — I10 Essential (primary) hypertension: Secondary | ICD-10-CM | POA: Diagnosis not present

## 2018-09-01 NOTE — Progress Notes (Signed)
Telehealth visit  Evaluation Performed:  Follow-up visit  Date:  09/01/2018   ID:  Mike Orozco, DOB 07-14-1960, MRN [id number]  Pt location-home Provider location-[place]  PCP:  Janith Lima, MD  Cardiologist:  Dr Stanford Breed  Chief Complaint:  FU NICM  History of Present Illness:    FU nonischemic cardiomyopathy with EF initially at 15-20%. Cardiac catheterization in 03/2010 revealed normal coronary arteries. Last echocardiogram 2/20 showed ejection fraction 50-55%, mild LVH, grade 2 DD and trace aortic insufficiency. Amlodipine added at last ov for hypertension. Since last seen, the patient denies any dyspnea on exertion, orthopnea, PND, pedal edema, palpitations, syncope or chest pain.   The patient does not have symptoms concerning for COVID-19 infection (fever, chills, cough, or new shortness of breath).    Past Medical History:  Diagnosis Date  . Anemia   . Arthritis   . CHF (congestive heart failure) ([place])   . Chronic renal insufficiency baseline CRE  1.5--1.8   NEPHROLOGIST--  DR WEBB ([facility])  . Diabetes mellitus type 2, insulin dependent (HCC) PER pt and LAST PCP NOTE CBG'S IN 100'   RECENTLY UNCONTROLLED AIC 07-07-2012  9.4 AND CBG'S 400'S  . GERD (gastroesophageal reflux disease)   . Gout, unspecified    stable  per pt  . Heart block AV first degree   . Heart failure, chronic systolic ([place])   . History of ETOH abuse   . Hypertension    CARDIOLOGIST-  DR Stanford Breed--- LOV IN [facility] 09-08-2011  . Nephrolithiasis    RIGHT  . Non-ischemic cardiomyopathy ([place]) SECONDARY TO HX UNCONTROLLED HTN AND ETOH ABUSE----  LAST ECHO EF 50%  09-22-2011   a) Echo 03/25/2010: EF 20-25%; mild AI; Mod MR; Mild LAE  b)cardiac cath 03/26/2010: normal cors; severe pulmonary HTN;PCWP 41; EF 10-15%;  c. echo 4/12:  EF 45-50%, mild LVH, grade 1 diast dysfxn, mild AI, mild LAE  . Personal history of colonic polyps-adenoma 02/17/2012   01/2012 - diminutive adenoma Repeat colon about  01/2017    . Polycystic kidney disease    BILATERAL RENAL CYST  . Pure hyperglyceridemia    Past Surgical History:  Procedure Laterality Date  . CARDIAC CATHETERIZATION  03-26-2010  DR Martinique   SEVERE LV DYSFUNCTION WITH MARKEDLY ELEVATED FILLING PRESSURE/ SEVERE PULMONARY HYPERTENSION/ NORMAL CORONARY ANATOMY  . colonoscopy    . COLONOSCOPY  2013  . CYSTOSCOPY WITH RETROGRADE PYELOGRAM, URETEROSCOPY AND STENT PLACEMENT Right 08/18/2012   Procedure: CYSTOSCOPY WITH RETROGRADE PYELOGRAM, URETEROSCOPY AND STENT PLACEMENT;  Surgeon: Molli Hazard, MD;  Location: [facility];  Service: Urology;  Laterality: Right;  . KIDNEY STONE SURGERY     X 2  . KNEE ARTHROSCOPY W/ MENISCECTOMY Left 02-06-2011   AND CHONDROPLASTY  . LUMBAR FUSION  08-10-2003   L5  --  S1  . NEPHROLITHOTOMY Right 09/15/2012   Procedure: NEPHROLITHOTOMY PERCUTANEOUS;  Surgeon: Molli Hazard, MD;  Location: [facility];  Service: Urology;  Laterality: Right;  RIGHT PERCUTANEOUS NEPHROSTOLITHOTOMY   . REMOVAL CYST LEFT LUNG  1994   AT [facility]   benign  . RHINOPLASTY  1970'S   nose fx  . TRANSTHORACIC ECHOCARDIOGRAM  09-22-2011  DR Suhan Paci   EF 50%/  MILD LVH/ GRADE I DIASTOLIC DYSFUNCTION/ MILD TR     Current Meds  Medication Sig  . allopurinol (ZYLOPRIM) 100 MG tablet Take 1 tablet (100 mg total) by mouth daily.  Marland Kitchen amLODipine (NORVASC) 5 MG tablet Take 1 tablet (5 mg total) by  mouth daily.  Marland Kitchen aspirin EC 81 MG tablet Take 1 tablet (81 mg total) by mouth daily.  Marland Kitchen atorvastatin (LIPITOR) 40 MG tablet Take 1 tablet (40 mg total) by mouth daily.  . carvedilol (COREG) 25 MG tablet Take 1 tablet (25 mg total) by mouth 2 (two) times daily with a meal.  . hydrochlorothiazide (HYDRODIURIL) 25 MG tablet Take 1 tablet (25 mg total) by mouth daily.  . irbesartan (AVAPRO) 300 MG tablet Take 1 tablet (300 mg total) by mouth daily.  Marland Kitchen levothyroxine (SYNTHROID, LEVOTHROID) 50 MCG tablet Take 1 tablet (50 mcg  total) by mouth daily before breakfast.  . thiamine (VITAMIN B-1) 50 MG tablet Take 1 tablet (50 mg total) by mouth daily.     Allergies:   Lisinopril; Metformin; and Metformin and related   Social History   Tobacco Use  . Smoking status: Current Every Day Smoker    Packs/day: 0.00    Years: 32.00    Pack years: 0.00    Types: Cigars    Last attempt to quit: 02/27/2007    Years since quitting: 11.5  . Smokeless tobacco: Never Used  Substance Use Topics  . Alcohol use: Yes    Alcohol/week: 21.0 standard drinks    Types: 21 Cans of beer per week    Comment: HX ALCOHOL ABUSE--  pt states he drinks 2 beers a day  . Drug use: No     Family Hx: The patient's family history includes Colon cancer in his paternal grandfather; Diabetes in his father and mother; Hypertension in his father and mother.  ROS:   Please see the history of present illness.    No F/C, productive cough All other systems reviewed and are negative  Recent Labs: 06/01/2018: ALT 21; BUN 15; Creatinine, Ser 1.16; Hemoglobin 12.3; Platelets 262.0; Potassium 3.8; Sodium 141 06/02/2018: TSH 10.32   Recent Lipid Panel Lab Results  Component Value Date/Time   CHOL 186 06/01/2018 03:48 PM   TRIG 202.0 (H) 06/01/2018 03:48 PM   HDL 46.20 06/01/2018 03:48 PM   CHOLHDL 4 06/01/2018 03:48 PM   LDLCALC UNABLE TO CALCULATE IF TRIGLYCERIDE OVER 400 mg/dL 03/23/2013 04:00 AM   LDLDIRECT 107.0 06/01/2018 03:48 PM    Wt Readings from Last 3 Encounters:  09/01/18 183 lb (83 kg)  07/06/18 173 lb (78.5 kg)  07/05/18 173 lb (78.5 kg)     Objective:    Vital Signs:  Ht 5\' 6"  (1.676 m)   Wt 183 lb (83 kg)   BMI 29.54 kg/m    Well nourished, well developed male in no acute distress. Remainder of physical exam not performed (telehealth visit; corona virus pandemic)  ASSESSMENT & PLAN:    1. NICM-improved on most recent echo; continue ARB and beta blocker. 2. Hypertension-BP controlled; continue present meds 3. H/O  renal insufficiency-now followed by primary care.  COVID-19 Education: The importance of social distancing was discussed today.  Time:   Today, I have spent 20 minutes with the patient with telehealth technology discussing the above problems.     Medication Adjustments/Labs and Tests Ordered: Current medicines are reviewed at length with the patient today.  Concerns regarding medicines are outlined above.   Tests Ordered: No orders of the defined types were placed in this encounter.   Medication Changes: No orders of the defined types were placed in this encounter.   Disposition:  Follow up 6  Signed, Kirk Ruths, MD  09/01/2018 12:03 PM    [facility] 

## 2018-09-01 NOTE — Patient Instructions (Signed)

## 2018-09-18 ENCOUNTER — Other Ambulatory Visit: Payer: Self-pay | Admitting: Internal Medicine

## 2018-09-18 DIAGNOSIS — E118 Type 2 diabetes mellitus with unspecified complications: Secondary | ICD-10-CM

## 2018-09-18 DIAGNOSIS — I5022 Chronic systolic (congestive) heart failure: Secondary | ICD-10-CM

## 2018-09-25 ENCOUNTER — Other Ambulatory Visit: Payer: Self-pay | Admitting: Internal Medicine

## 2018-09-25 DIAGNOSIS — I10 Essential (primary) hypertension: Secondary | ICD-10-CM

## 2018-10-14 ENCOUNTER — Other Ambulatory Visit: Payer: Self-pay | Admitting: Internal Medicine

## 2018-10-14 DIAGNOSIS — M1000 Idiopathic gout, unspecified site: Secondary | ICD-10-CM

## 2018-12-14 ENCOUNTER — Other Ambulatory Visit: Payer: Self-pay | Admitting: Internal Medicine

## 2018-12-14 DIAGNOSIS — I5022 Chronic systolic (congestive) heart failure: Secondary | ICD-10-CM

## 2018-12-14 DIAGNOSIS — E118 Type 2 diabetes mellitus with unspecified complications: Secondary | ICD-10-CM

## 2019-01-13 ENCOUNTER — Encounter (HOSPITAL_BASED_OUTPATIENT_CLINIC_OR_DEPARTMENT_OTHER): Payer: Self-pay | Admitting: *Deleted

## 2019-01-13 ENCOUNTER — Other Ambulatory Visit: Payer: Self-pay

## 2019-01-13 ENCOUNTER — Emergency Department (HOSPITAL_BASED_OUTPATIENT_CLINIC_OR_DEPARTMENT_OTHER)
Admission: EM | Admit: 2019-01-13 | Discharge: 2019-01-13 | Disposition: A | Discharge status: Home / Self Care | Payer: Managed Care, Other (non HMO) | Attending: Emergency Medicine | Admitting: Emergency Medicine

## 2019-01-13 ENCOUNTER — Emergency Department (HOSPITAL_BASED_OUTPATIENT_CLINIC_OR_DEPARTMENT_OTHER): Payer: Managed Care, Other (non HMO)

## 2019-01-13 DIAGNOSIS — F17290 Nicotine dependence, other tobacco product, uncomplicated: Secondary | ICD-10-CM | POA: Diagnosis not present

## 2019-01-13 DIAGNOSIS — M79674 Pain in right toe(s): Secondary | ICD-10-CM | POA: Diagnosis present

## 2019-01-13 DIAGNOSIS — M109 Gout, unspecified: Secondary | ICD-10-CM | POA: Diagnosis not present

## 2019-01-13 DIAGNOSIS — I110 Hypertensive heart disease with heart failure: Secondary | ICD-10-CM | POA: Insufficient documentation

## 2019-01-13 DIAGNOSIS — E039 Hypothyroidism, unspecified: Secondary | ICD-10-CM | POA: Insufficient documentation

## 2019-01-13 DIAGNOSIS — E119 Type 2 diabetes mellitus without complications: Secondary | ICD-10-CM | POA: Insufficient documentation

## 2019-01-13 DIAGNOSIS — Z79899 Other long term (current) drug therapy: Secondary | ICD-10-CM | POA: Insufficient documentation

## 2019-01-13 DIAGNOSIS — I5022 Chronic systolic (congestive) heart failure: Secondary | ICD-10-CM | POA: Diagnosis not present

## 2019-01-13 DIAGNOSIS — Z7982 Long term (current) use of aspirin: Secondary | ICD-10-CM | POA: Diagnosis not present

## 2019-01-13 MED ORDER — TRAMADOL HCL 50 MG PO TABS: 50.0000 mg | ORAL_TABLET | Freq: Four times a day (QID) | ORAL | 0 refills | Status: DC | PRN

## 2019-01-13 MED ORDER — TRAMADOL HCL 50 MG PO TABS
50.0000 mg | ORAL_TABLET | Freq: Once | ORAL | Status: AC
  Administered 2019-01-13: 50 mg via ORAL
  Filled 2019-01-13: qty 1

## 2019-01-13 MED ORDER — PREDNISONE 10 MG PO TABS: 20.0000 mg | ORAL_TABLET | Freq: Two times a day (BID) | ORAL | 0 refills | Status: DC

## 2019-01-13 NOTE — ED Provider Notes (Signed)
[facility] Provider Note   CSN: [id number] Arrival date & time: 01/13/19  0754     History   Chief Complaint Chief Complaint  Patient presents with  . Toe Pain    HPI Mike Orozco is a 58 y.o. male.     Patient is a 58 year old male with past medical history of chronic renal insufficiency, heart failure, diabetes.  He presents today for evaluation of right great toe pain.  This started 2 weeks ago and is gradually worsening.  He denies any specific injury, but does state that he was mowing grass recently and may have strained it then.  He does have a history of gout and this feels similar.  The history is provided by the patient.  Toe Pain This is a new problem. The problem occurs constantly. The problem has been gradually worsening. Exacerbated by: Walking, bearing weight. Nothing relieves the symptoms.    Past Medical History:  Diagnosis Date  . Anemia   . Arthritis   . CHF (congestive heart failure) ([place])   . Chronic renal insufficiency baseline CRE  1.5--1.8   NEPHROLOGIST--  DR WEBB ([facility])  . Diabetes mellitus type 2, insulin dependent (HCC) PER pt and LAST PCP NOTE CBG'S IN 100'   RECENTLY UNCONTROLLED AIC 07-07-2012  9.4 AND CBG'S 400'S  . GERD (gastroesophageal reflux disease)   . Gout, unspecified    stable  per pt  . Heart block AV first degree   . Heart failure, chronic systolic ([place])   . History of ETOH abuse   . Hypertension    CARDIOLOGIST-  DR Stanford Breed--- LOV IN [facility] 09-08-2011  . Nephrolithiasis    RIGHT  . Non-ischemic cardiomyopathy ([place]) SECONDARY TO HX UNCONTROLLED HTN AND ETOH ABUSE----  LAST ECHO EF 50%  09-22-2011   a) Echo 03/25/2010: EF 20-25%; mild AI; Mod MR; Mild LAE  b)cardiac cath 03/26/2010: normal cors; severe pulmonary HTN;PCWP 41; EF 10-15%;  c. echo 4/12:  EF 45-50%, mild LVH, grade 1 diast dysfxn, mild AI, mild LAE  . Personal history of colonic polyps-adenoma 02/17/2012   01/2012 - diminutive  adenoma Repeat colon about 01/2017    . Polycystic kidney disease    BILATERAL RENAL CYST  . Pure hyperglyceridemia     Patient Active Problem List   Diagnosis Date Noted  . Hypothyroidism (acquired) 06/02/2018  . Thiamine deficiency 06/29/2017  . Hyperlipidemia with target LDL less than 100 11/13/2015  . TSH elevation 11/13/2015  . Routine general medical examination at a health care facility 11/13/2015  . Hypertriglyceridemia 03/22/2013  . Ventral hernia 03/21/2013  . Type II diabetes mellitus with manifestations ([place]) 07/12/2012  . Personal history of colonic polyps-adenoma 02/17/2012  . Chronic renal insufficiency 10/27/2011  . Gout 05/28/2011  . Anemia of chronic disease 12/16/2010  . Kidney stones 12/16/2010  . Low back pain 08/19/2010  . Allergic rhinitis due to other allergen 06/17/2010  . CARDIOMYOPATHY 04/03/2010  . Chronic systolic heart failure ([place]) 04/03/2010  . Essential hypertension, benign 04/02/2010    Past Surgical History:  Procedure Laterality Date  . CARDIAC CATHETERIZATION  03-26-2010  DR Martinique   SEVERE LV DYSFUNCTION WITH MARKEDLY ELEVATED FILLING PRESSURE/ SEVERE PULMONARY HYPERTENSION/ NORMAL CORONARY ANATOMY  . colonoscopy    . COLONOSCOPY  2013  . CYSTOSCOPY WITH RETROGRADE PYELOGRAM, URETEROSCOPY AND STENT PLACEMENT Right 08/18/2012   Procedure: CYSTOSCOPY WITH RETROGRADE PYELOGRAM, URETEROSCOPY AND STENT PLACEMENT;  Surgeon: Molli Hazard, MD;  Location: [facility];  Service: Urology;  Laterality: Right;  . KIDNEY STONE SURGERY     X 2  . KNEE ARTHROSCOPY W/ MENISCECTOMY Left 02-06-2011   AND CHONDROPLASTY  . LUMBAR FUSION  08-10-2003   L5  --  S1  . NEPHROLITHOTOMY Right 09/15/2012   Procedure: NEPHROLITHOTOMY PERCUTANEOUS;  Surgeon: Molli Hazard, MD;  Location: [facility];  Service: Urology;  Laterality: Right;  RIGHT PERCUTANEOUS NEPHROSTOLITHOTOMY   . REMOVAL CYST LEFT LUNG  1994   AT [facility]   benign  .  RHINOPLASTY  1970'S   nose fx  . TRANSTHORACIC ECHOCARDIOGRAM  09-22-2011  DR CRENSHAW   EF 50%/  MILD LVH/ GRADE I DIASTOLIC DYSFUNCTION/ MILD TR        Home Medications    Prior to Admission medications   Medication Sig Start Date End Date Taking? Authorizing Provider  allopurinol (ZYLOPRIM) 100 MG tablet TAKE 1 TABLET BY MOUTH EVERY DAY 10/14/18   Janith Lima, MD  amLODipine (NORVASC) 5 MG tablet Take 1 tablet (5 mg total) by mouth daily. 06/30/18 09/28/18  Lelon Perla, MD  aspirin EC 81 MG tablet Take 1 tablet (81 mg total) by mouth daily. 12/24/17   Janith Lima, MD  atorvastatin (LIPITOR) 40 MG tablet Take 1 tablet (40 mg total) by mouth daily. 06/01/18   Janith Lima, MD  carvedilol (COREG) 25 MG tablet Take 1 tablet (25 mg total) by mouth 2 (two) times daily with a meal. 06/01/18   Janith Lima, MD  hydrochlorothiazide (HYDRODIURIL) 25 MG tablet TAKE 1 TABLET BY MOUTH EVERY DAY 09/27/18   Janith Lima, MD  irbesartan (AVAPRO) 300 MG tablet TAKE 1 TABLET BY MOUTH EVERY DAY 09/20/18   Janith Lima, MD  levothyroxine (SYNTHROID, LEVOTHROID) 50 MCG tablet Take 1 tablet (50 mcg total) by mouth daily before breakfast. 06/02/18   Janith Lima, MD  thiamine (VITAMIN B-1) 50 MG tablet Take 1 tablet (50 mg total) by mouth daily. 06/05/18   Janith Lima, MD    Family History Family History  Problem Relation Age of Onset  . Diabetes Mother   . Hypertension Mother   . Diabetes Father   . Hypertension Father   . Colon cancer Paternal Grandfather     Social History Social History   Tobacco Use  . Smoking status: Current Every Day Smoker    Packs/day: 0.00    Years: 32.00    Pack years: 0.00    Types: Cigars    Last attempt to quit: 02/27/2007    Years since quitting: 11.8  . Smokeless tobacco: Never Used  Substance Use Topics  . Alcohol use: Yes    Alcohol/week: 21.0 standard drinks    Types: 21 Cans of beer per week    Comment: HX ALCOHOL ABUSE--  pt  states he drinks 2 beers a day  . Drug use: No     Allergies   Lisinopril, Metformin, and Metformin and related   Review of Systems Review of Systems  All other systems reviewed and are negative.    Physical Exam Updated Vital Signs BP (!) 146/88 (BP Location: Right Arm)   Pulse 77   Temp 98.3 F (36.8 C) (Oral)   Resp 18   Ht 5\' 6"  (1.676 m)   Wt 83.9 kg   SpO2 100%   BMI 29.86 kg/m   Physical Exam Vitals signs and nursing note reviewed.  Constitutional:      General: He is not in  acute distress.    Appearance: Normal appearance. He is not ill-appearing.  HENT:     Head: Normocephalic and atraumatic.  Pulmonary:     Effort: Pulmonary effort is normal.  Musculoskeletal:     Comments: The right great toe he does have swelling and tenderness over the MTP joint.  There is mild erythema and warmth in this area.  He does have pain with range of motion.  Skin:    General: Skin is warm and dry.  Neurological:     Mental Status: He is alert and oriented to person, place, and time.      ED Treatments / Results  Labs (all labs ordered are listed, but only abnormal results are displayed) Labs Reviewed - No data to display  EKG None  Radiology No results found.  Procedures Procedures (including critical care time)  Medications Ordered in ED Medications - No data to display   Initial Impression / Assessment and Plan / ED Course  I have reviewed the triage vital signs and the nursing notes.  Pertinent labs & imaging results that were available during my care of the patient were reviewed by me and considered in my medical decision making (see chart for details).  Patient with right great toe pain that I suspect is gout.  His x-rays are unremarkable.  Patient will be treated with prednisone and follow-up as needed.  Final Clinical Impressions(s) / ED Diagnoses   Final diagnoses:  None    ED Discharge Orders    None       Veryl Speak, MD 01/13/19  [phone]

## 2019-01-13 NOTE — ED Triage Notes (Signed)
Rt toe pain and swelling x 2 weeks  Unsure of any inj

## 2019-01-13 NOTE — Discharge Instructions (Addendum)
Prednisone as prescribed.  Follow-up with your primary doctor if symptoms or not improving in the next 3 days.

## 2019-01-26 ENCOUNTER — Other Ambulatory Visit: Payer: Self-pay | Admitting: Internal Medicine

## 2019-01-26 DIAGNOSIS — I10 Essential (primary) hypertension: Secondary | ICD-10-CM

## 2019-02-10 ENCOUNTER — Other Ambulatory Visit: Payer: Self-pay | Admitting: Internal Medicine

## 2019-02-10 DIAGNOSIS — I10 Essential (primary) hypertension: Secondary | ICD-10-CM

## 2019-02-10 DIAGNOSIS — E118 Type 2 diabetes mellitus with unspecified complications: Secondary | ICD-10-CM

## 2019-02-10 DIAGNOSIS — M1000 Idiopathic gout, unspecified site: Secondary | ICD-10-CM

## 2019-02-10 DIAGNOSIS — I5022 Chronic systolic (congestive) heart failure: Secondary | ICD-10-CM

## 2019-02-17 ENCOUNTER — Ambulatory Visit: Payer: Managed Care, Other (non HMO) | Admitting: Internal Medicine

## 2019-02-21 ENCOUNTER — Other Ambulatory Visit: Payer: Self-pay

## 2019-02-21 DIAGNOSIS — I428 Other cardiomyopathies: Secondary | ICD-10-CM

## 2019-02-21 MED ORDER — AMLODIPINE BESYLATE 5 MG PO TABS: 5.0000 mg | ORAL_TABLET | Freq: Every day | ORAL | 3 refills | Status: DC

## 2019-02-23 ENCOUNTER — Other Ambulatory Visit: Payer: Self-pay

## 2019-02-23 DIAGNOSIS — I428 Other cardiomyopathies: Secondary | ICD-10-CM

## 2019-02-23 MED ORDER — AMLODIPINE BESYLATE 5 MG PO TABS: 5.0000 mg | ORAL_TABLET | Freq: Every day | ORAL | 3 refills | Status: DC

## 2019-02-23 NOTE — Addendum Note (Signed)
Addended by: Aviva Signs M on: 02/23/2019 10:26 AM   Modules accepted: Orders

## 2019-02-23 NOTE — Telephone Encounter (Signed)
Pt has scheduled a follow up appointment for 03/02/2019.   Can refills be sent in?

## 2019-02-24 ENCOUNTER — Ambulatory Visit: Payer: Managed Care, Other (non HMO) | Admitting: Internal Medicine

## 2019-03-01 ENCOUNTER — Other Ambulatory Visit: Payer: Self-pay | Admitting: Internal Medicine

## 2019-03-01 DIAGNOSIS — I10 Essential (primary) hypertension: Secondary | ICD-10-CM

## 2019-03-01 DIAGNOSIS — E118 Type 2 diabetes mellitus with unspecified complications: Secondary | ICD-10-CM

## 2019-03-01 DIAGNOSIS — I5022 Chronic systolic (congestive) heart failure: Secondary | ICD-10-CM

## 2019-03-01 DIAGNOSIS — I428 Other cardiomyopathies: Secondary | ICD-10-CM

## 2019-03-01 DIAGNOSIS — M1000 Idiopathic gout, unspecified site: Secondary | ICD-10-CM

## 2019-03-01 NOTE — Telephone Encounter (Signed)
Requested medication (s) are due for refill today: yes  Requested medication (s) are on the active medication list: yes  Last refill:  05/22/2018  Future visit scheduled: yes  Notes to clinic:  Review for refill   Requested Prescriptions  Pending Prescriptions Disp Refills   carvedilol (COREG) 25 MG tablet 180 tablet 0    Sig: Take 1 tablet (25 mg total) by mouth 2 (two) times daily with a meal.     Cardiovascular:  Beta Blockers Failed - 03/01/2019  9:29 AM      Failed - Last BP in normal range    BP Readings from Last 1 Encounters:  01/13/19 (!) 146/88         Failed - Valid encounter within last 6 months    Recent Outpatient Visits          9 months ago Essential hypertension, benign   [place], Thomas L, MD   1 year ago Renal cyst   [place], Marvis Repress, [place]   1 year ago Right foot pain   [place], Jeremy E, MD   1 year ago Type 2 diabetes mellitus with complication, without long-term current use of insulin ([place])   [place], Thomas L, MD   2 years ago Need for prophylactic vaccination and inoculation against influenza   [place], [place], MD      Future Appointments            Tomorrow Janith Lima, MD [place], [facility]           Passed - Last Heart Rate in normal range    Pulse Readings from Last 1 Encounters:  01/13/19 77          allopurinol (ZYLOPRIM) 100 MG tablet 90 tablet 0    Sig: Take 1 tablet (100 mg total) by mouth daily.     Endocrinology:  Gout Agents Passed - 03/01/2019  9:29 AM      Passed - Uric Acid in normal range and within 360 days    Uric Acid, Serum  Date Value Ref Range Status  06/01/2018 7.8 4.0 - 7.8 mg/dL Final         Passed - Cr in normal range and within 360 days    Creatinine, Ser  Date Value Ref Range Status   06/01/2018 1.16 0.40 - 1.50 mg/dL Final         Passed - Valid encounter within last 12 months    Recent Outpatient Visits          9 months ago Essential hypertension, benign   [place], Thomas L, MD   1 year ago Renal cyst   [place], Marvis Repress, [place]   1 year ago Right foot pain   [place], Jeremy E, MD   1 year ago Type 2 diabetes mellitus with complication, without long-term current use of insulin [facility])   [place], Thomas L, MD   2 years ago Need for prophylactic vaccination and inoculation against influenza   [place], [place], MD      Future Appointments            Tomorrow Janith Lima, MD [place], [facility]  hydrochlorothiazide (HYDRODIURIL) 25 MG tablet 90 tablet 0    Sig: Take 1 tablet (25 mg total) by mouth daily.     Cardiovascular: Diuretics - Thiazide Failed - 03/01/2019  9:29 AM      Failed - Last BP in normal range    BP Readings from Last 1 Encounters:  01/13/19 (!) 146/88         Failed - Valid encounter within last 6 months    Recent Outpatient Visits          9 months ago Essential hypertension, benign   [place], Thomas L, MD   1 year ago Renal cyst   [place], Marvis Repress, [place]   1 year ago Right foot pain   [facility], Enid Baas, MD   1 year ago Type 2 diabetes mellitus with complication, without long-term current use of insulin ([place])   [place], Thomas L, MD   2 years ago Need for prophylactic vaccination and inoculation against influenza   [place], [place], MD      Future Appointments            Tomorrow Janith Lima, MD [place], [place] in normal range and within 360 days    Calcium  Date Value Ref Range Status  06/01/2018 9.8 8.4 - 10.5 mg/dL Final         Passed - Cr in normal range and within 360 days    Creatinine, Ser  Date Value Ref Range Status  06/01/2018 1.16 0.40 - 1.50 mg/dL Final         Passed - K in normal range and within 360 days    Potassium  Date Value Ref Range Status  06/01/2018 3.8 3.5 - 5.1 mEq/L Final         Passed - Na in normal range and within 360 days    Sodium  Date Value Ref Range Status  06/01/2018 141 135 - 145 mEq/L Final  07/04/2016 138 137 - 147 mmol/L Final          irbesartan (AVAPRO) 300 MG tablet 90 tablet 0    Sig: Take 1 tablet (300 mg total) by mouth daily.     Cardiovascular:  Angiotensin Receptor Blockers Failed - 03/01/2019  9:29 AM      Failed - Cr in normal range and within 180 days    Creatinine, Ser  Date Value Ref Range Status  06/01/2018 1.16 0.40 - 1.50 mg/dL Final         Failed - K in normal range and within 180 days    Potassium  Date Value Ref Range Status  06/01/2018 3.8 3.5 - 5.1 mEq/L Final         Failed - Last BP in normal range    BP Readings from Last 1 Encounters:  01/13/19 (!) 146/88         Failed - Valid encounter within last 6 months    Recent Outpatient Visits          9 months ago Essential hypertension, benign   [place], Thomas L, MD   1 year ago Renal cyst   [place], Marvis Repress, [place]   1 year ago Right foot pain  [facility], [facility]   1 year ago Type 2 diabetes mellitus with complication, without long-term current use of insulin [facility])   [place], Thomas L, MD   2 years ago Need for prophylactic vaccination and inoculation against influenza   [place], [place], MD      Future  Appointments            Tomorrow Janith Lima, MD [place], [place] - Patient is not pregnant

## 2019-03-01 NOTE — Telephone Encounter (Signed)
Medication refill:  carvedilol (COREG) 25 MG tablet [id number] allopurinol (ZYLOPRIM) 100 MG tablet [id number]  irbesartan (AVAPRO) 300 MG tablet [id number]  hydrochlorothiazide (HYDRODIURIL) 25 MG tablet [id number]     Pharmacy:  [facility], [place] [phone] (Phone) [phone] (Fax)   Pharmacy aware of turn around time

## 2019-03-02 ENCOUNTER — Encounter: Payer: Self-pay | Admitting: Internal Medicine

## 2019-03-02 ENCOUNTER — Other Ambulatory Visit: Payer: Self-pay | Admitting: Internal Medicine

## 2019-03-02 ENCOUNTER — Other Ambulatory Visit: Payer: Self-pay

## 2019-03-02 ENCOUNTER — Ambulatory Visit (INDEPENDENT_AMBULATORY_CARE_PROVIDER_SITE_OTHER): Payer: Managed Care, Other (non HMO) | Admitting: Internal Medicine

## 2019-03-02 ENCOUNTER — Other Ambulatory Visit (INDEPENDENT_AMBULATORY_CARE_PROVIDER_SITE_OTHER): Payer: Managed Care, Other (non HMO)

## 2019-03-02 VITALS — BP 130/88 | HR 74 | Temp 98.3°F | Ht 66.0 in | Wt 181.0 lb

## 2019-03-02 DIAGNOSIS — E781 Pure hyperglyceridemia: Secondary | ICD-10-CM

## 2019-03-02 DIAGNOSIS — Z0000 Encounter for general adult medical examination without abnormal findings: Secondary | ICD-10-CM

## 2019-03-02 DIAGNOSIS — E519 Thiamine deficiency, unspecified: Secondary | ICD-10-CM

## 2019-03-02 DIAGNOSIS — N182 Chronic kidney disease, stage 2 (mild): Secondary | ICD-10-CM | POA: Diagnosis not present

## 2019-03-02 DIAGNOSIS — D638 Anemia in other chronic diseases classified elsewhere: Secondary | ICD-10-CM

## 2019-03-02 DIAGNOSIS — M1000 Idiopathic gout, unspecified site: Secondary | ICD-10-CM

## 2019-03-02 DIAGNOSIS — I10 Essential (primary) hypertension: Secondary | ICD-10-CM

## 2019-03-02 DIAGNOSIS — E039 Hypothyroidism, unspecified: Secondary | ICD-10-CM

## 2019-03-02 DIAGNOSIS — I5022 Chronic systolic (congestive) heart failure: Secondary | ICD-10-CM

## 2019-03-02 DIAGNOSIS — Z23 Encounter for immunization: Secondary | ICD-10-CM

## 2019-03-02 DIAGNOSIS — N2889 Other specified disorders of kidney and ureter: Secondary | ICD-10-CM

## 2019-03-02 DIAGNOSIS — E118 Type 2 diabetes mellitus with unspecified complications: Secondary | ICD-10-CM

## 2019-03-02 DIAGNOSIS — E785 Hyperlipidemia, unspecified: Secondary | ICD-10-CM

## 2019-03-02 LAB — BASIC METABOLIC PANEL
BUN: 21 mg/dL (ref 6–23)
CO2: 27 mEq/L (ref 19–32)
Calcium: 10.5 mg/dL (ref 8.4–10.5)
Chloride: 104 mEq/L (ref 96–112)
Creatinine, Ser: 1.33 mg/dL (ref 0.40–1.50)
GFR: 66.5 mL/min (ref >60.00)
Glucose, Bld: 115 mg/dL — ABNORMAL HIGH (ref 70–99)
Potassium: 3.7 mEq/L (ref 3.5–5.1)
Sodium: 140 mEq/L (ref 135–145)

## 2019-03-02 LAB — CBC WITH DIFFERENTIAL/PLATELET
Basophils Absolute: 0.1 10*3/uL (ref 0.0–0.1)
Basophils Relative: 1 % (ref 0.0–3.0)
Eosinophils Absolute: 0.1 10*3/uL (ref 0.0–0.7)
Eosinophils Relative: 1.7 % (ref 0.0–5.0)
HCT: 38.1 % — ABNORMAL LOW (ref 39.0–52.0)
Hemoglobin: 12.8 g/dL — ABNORMAL LOW (ref 13.0–17.0)
Lymphocytes Relative: 35.9 % (ref 12.0–46.0)
Lymphs Abs: 2.1 10*3/uL (ref 0.7–4.0)
MCHC: 33.6 g/dL (ref 30.0–36.0)
MCV: 94.6 fl (ref 78.0–100.0)
Monocytes Absolute: 0.5 10*3/uL (ref 0.1–1.0)
Monocytes Relative: 7.9 % (ref 3.0–12.0)
Neutro Abs: 3.1 10*3/uL (ref 1.4–7.7)
Neutrophils Relative %: 53.5 % (ref 43.0–77.0)
Platelets: 272 10*3/uL (ref 150.0–400.0)
RBC: 4.03 Mil/uL — ABNORMAL LOW (ref 4.22–5.81)
RDW: 12.8 % (ref 11.5–15.5)
WBC: 5.7 10*3/uL (ref 4.0–10.5)

## 2019-03-02 LAB — LIPID PANEL
Cholesterol: 176 mg/dL (ref 0–200)
HDL: 44 mg/dL (ref >39.00)
Total CHOL/HDL Ratio: 4
Triglycerides: 449 mg/dL — ABNORMAL HIGH (ref 0.0–149.0)

## 2019-03-02 LAB — HEMOGLOBIN A1C: Hgb A1c MFr Bld: 5.9 % (ref 4.6–6.5)

## 2019-03-02 LAB — LDL CHOLESTEROL, DIRECT: Direct LDL: 74 mg/dL

## 2019-03-02 LAB — TSH: TSH: 10.3 u[IU]/mL — ABNORMAL HIGH (ref 0.35–4.50)

## 2019-03-02 MED ORDER — HYDROCHLOROTHIAZIDE 25 MG PO TABS: 25.0000 mg | ORAL_TABLET | Freq: Every day | ORAL | 1 refills | Status: DC

## 2019-03-02 MED ORDER — CARVEDILOL 25 MG PO TABS: 25.0000 mg | ORAL_TABLET | Freq: Two times a day (BID) | ORAL | 1 refills | Status: DC

## 2019-03-02 MED ORDER — ATORVASTATIN CALCIUM 40 MG PO TABS: 40.0000 mg | ORAL_TABLET | Freq: Every day | ORAL | 1 refills | Status: DC

## 2019-03-02 MED ORDER — VASCEPA 1 G PO CAPS: 2.0000 | ORAL_CAPSULE | Freq: Two times a day (BID) | ORAL | 1 refills | Status: DC

## 2019-03-02 MED ORDER — ALLOPURINOL 100 MG PO TABS: 100.0000 mg | ORAL_TABLET | Freq: Every day | ORAL | 1 refills | Status: DC

## 2019-03-02 MED ORDER — IRBESARTAN 300 MG PO TABS: 300.0000 mg | ORAL_TABLET | Freq: Every day | ORAL | 1 refills | Status: DC

## 2019-03-02 MED ORDER — LEVOTHYROXINE SODIUM 50 MCG PO TABS: 50.0000 ug | ORAL_TABLET | Freq: Every day | ORAL | 0 refills | Status: DC

## 2019-03-02 NOTE — Progress Notes (Signed)
Subjective:  Patient ID: Mike Orozco, male    DOB: 12-06-1960  Age: 58 y.o. MRN: [id number]  CC: Anemia, Diabetes, Hypertension, Hypothyroidism, and Annual Exam   HPI Mike Orozco presents for a CPX.  He feels well and offers no complaints.  He is active and denies any recent episodes of CP, DOE, palpitations, edema, fatigue, or weight gain.  According to his prescription refills he would have run out of levothyroxine about 6 months ago.  Outpatient Medications Prior to Visit  Medication Sig Dispense Refill  . amLODipine (NORVASC) 5 MG tablet Take 1 tablet (5 mg total) by mouth daily. 90 tablet 3  . aspirin EC 81 MG tablet Take 1 tablet (81 mg total) by mouth daily. 30 tablet 0  . thiamine (VITAMIN B-1) 50 MG tablet Take 1 tablet (50 mg total) by mouth daily. 90 tablet 1  . allopurinol (ZYLOPRIM) 100 MG tablet TAKE 1 TABLET BY MOUTH EVERY DAY 90 tablet 0  . atorvastatin (LIPITOR) 40 MG tablet Take 1 tablet (40 mg total) by mouth daily. 90 tablet 1  . carvedilol (COREG) 25 MG tablet Take 1 tablet (25 mg total) by mouth 2 (two) times daily with a meal. 180 tablet 0  . hydrochlorothiazide (HYDRODIURIL) 25 MG tablet TAKE 1 TABLET BY MOUTH EVERY DAY 90 tablet 0  . irbesartan (AVAPRO) 300 MG tablet TAKE 1 TABLET BY MOUTH EVERY DAY 90 tablet 0  . levothyroxine (SYNTHROID, LEVOTHROID) 50 MCG tablet Take 1 tablet (50 mcg total) by mouth daily before breakfast. 90 tablet 0  . predniSONE (DELTASONE) 10 MG tablet Take 2 tablets (20 mg total) by mouth 2 (two) times daily. 20 tablet 0  . traMADol (ULTRAM) 50 MG tablet Take 1 tablet (50 mg total) by mouth every 6 (six) hours as needed. 12 tablet 0   No facility-administered medications prior to visit.     ROS Review of Systems  Constitutional: Negative.  Negative for appetite change, diaphoresis, fatigue and unexpected weight change.  HENT: Negative.   Eyes: Negative.  Negative for visual disturbance.  Respiratory: Negative for cough, chest  tightness, shortness of breath and wheezing.   Cardiovascular: Negative for chest pain, palpitations and leg swelling.  Gastrointestinal: Negative for abdominal pain, constipation, diarrhea, nausea and vomiting.  Endocrine: Negative.  Negative for cold intolerance and heat intolerance.  Genitourinary: Negative.  Negative for difficulty urinating, hematuria, scrotal swelling, testicular pain and urgency.  Musculoskeletal: Negative for arthralgias and myalgias.  Skin: Negative.  Negative for color change and pallor.  Neurological: Negative.  Negative for dizziness, weakness, light-headedness and headaches.  Hematological: Negative for adenopathy. Does not bruise/bleed easily.  Psychiatric/Behavioral: Negative.     Objective:  BP 130/88 (BP Location: Left Arm, Patient Position: Sitting, Cuff Size: Large)   Pulse 74   Temp 98.3 F (36.8 C) (Oral)   Ht 5\' 6"  (1.676 m)   Wt 181 lb (82.1 kg)   SpO2 97%   BMI 29.21 kg/m   BP Readings from Last 3 Encounters:  03/02/19 130/88  01/13/19 (!) 146/88  07/06/18 (!) 160/90    Wt Readings from Last 3 Encounters:  03/02/19 181 lb (82.1 kg)  01/13/19 185 lb (83.9 kg)  09/01/18 183 lb (83 kg)    Physical Exam Vitals signs reviewed.  Constitutional:      Appearance: Normal appearance.  HENT:     Nose: Nose normal.     Mouth/Throat:     Mouth: Mucous membranes are moist.  Eyes:  General: No scleral icterus.    Conjunctiva/sclera: Conjunctivae normal.  Neck:     Musculoskeletal: Neck supple.  Cardiovascular:     Rate and Rhythm: Normal rate and regular rhythm.     Heart sounds: No murmur. No gallop.   Pulmonary:     Effort: Pulmonary effort is normal.     Breath sounds: No stridor. No wheezing, rhonchi or rales.  Abdominal:     General: Abdomen is flat. Bowel sounds are normal. There is no distension.     Palpations: Abdomen is soft. There is no hepatomegaly, splenomegaly or mass.     Tenderness: There is no abdominal tenderness.  There is no guarding.     Hernia: There is no hernia in the left inguinal area or right inguinal area.  Genitourinary:    Pubic Area: No rash.      Penis: Normal and uncircumcised. No phimosis, paraphimosis, hypospadias, erythema, tenderness, discharge, swelling or lesions.      Scrotum/Testes: Normal.        Right: Mass not present.        Left: Mass not present.     Epididymis:     Right: Normal. Not enlarged. No mass.     Left: Normal. Not enlarged. No mass.     Prostate: Normal. Not enlarged, not tender and no nodules present.     Rectum: Guaiac result negative. No mass, tenderness, anal fissure, external hemorrhoid or internal hemorrhoid. Abnormal anal tone.  Musculoskeletal: Normal range of motion.     Right lower leg: No edema.     Left lower leg: No edema.  Lymphadenopathy:     Cervical: No cervical adenopathy.     Lower Body: No right inguinal adenopathy. No left inguinal adenopathy.  Skin:    General: Skin is warm and dry.     Coloration: Skin is not pale.  Neurological:     General: No focal deficit present.     Mental Status: He is alert.  Psychiatric:        Mood and Affect: Mood normal.        Behavior: Behavior normal.     Lab Results  Component Value Date   WBC 5.7 03/02/2019   HGB 12.8 (L) 03/02/2019   HCT 38.1 (L) 03/02/2019   PLT 272.0 03/02/2019   GLUCOSE 115 (H) 03/02/2019   CHOL 176 03/02/2019   TRIG (H) 03/02/2019    449.0 Triglyceride is over 400; calculations on Lipids are invalid.   HDL 44.00 03/02/2019   LDLDIRECT 74.0 03/02/2019   LDLCALC UNABLE TO CALCULATE IF TRIGLYCERIDE OVER 400 mg/dL 03/23/2013   ALT 21 06/01/2018   AST 21 06/01/2018   NA 140 03/02/2019   K 3.7 03/02/2019   CL 104 03/02/2019   CREATININE 1.33 03/02/2019   BUN 21 03/02/2019   CO2 27 03/02/2019   TSH 10.30 (H) 03/02/2019   PSA 1.65 06/22/2017   INR 0.94 01/15/2018   HGBA1C 5.9 03/02/2019   MICROALBUR 14.4 (H) 06/01/2018    Dg Toe Great Right  Result Date:  01/13/2019 CLINICAL DATA:  Right great toe pain EXAM: RIGHT GREAT TOE COMPARISON:  None. FINDINGS: Diffuse soft tissue swelling. Minor arthritic change of the great toe interphalangeal joint with joint space loss and bony spurring. No visualized radiopaque foreign body. No acute osseous finding or fracture. IMPRESSION: Soft tissue swelling without acute osseous finding Electronically Signed   By: Jerilynn Mages.  Shick M.D.   On: 01/13/2019 09:02    Assessment & Plan:  Andrick was seen today for anemia, diabetes, hypertension, hypothyroidism and annual exam.  Diagnoses and all orders for this visit:  Hypothyroidism (acquired)- His TSH remains elevated.  I have asked him to restart levothyroxine. -     TSH; Future -     levothyroxine (SYNTHROID) 50 MCG tablet; Take 1 tablet (50 mcg total) by mouth daily before breakfast.  Chronic renal impairment, stage 2 (mild)- His renal function has improved.  Will continue to maintain blood pressure control and I have asked him to avoid anti-inflammatories. -     Basic metabolic panel; Future  Essential hypertension, benign- His blood pressure is adequately well controlled.  Will continue carvedilol and hydrochlorothiazide. -     Basic metabolic panel; Future -     Discontinue: hydrochlorothiazide (HYDRODIURIL) 25 MG tablet; Take 1 tablet (25 mg total) by mouth daily. -     Discontinue: carvedilol (COREG) 25 MG tablet; Take 1 tablet (25 mg total) by mouth 2 (two) times daily with a meal.  Anemia of chronic disease- His H&H have improved slightly.  I will continue to monitor this. -     CBC with Differential/Platelet; Future  Thiamine deficiency- He remains mildly anemic.  I will monitor his thiamine level. -     CBC with Differential/Platelet; Future -     Vitamin B1; Future  Type II diabetes mellitus with manifestations ([place])- His blood sugars are adequately well controlled. -     Hemoglobin A1c; Future -     Ambulatory referral to Ophthalmology -      Discontinue: irbesartan (AVAPRO) 300 MG tablet; Take 1 tablet (300 mg total) by mouth daily.  Need for influenza vaccination -     Flu Vaccine QUAD 36+ mos IM  Routine general medical examination at a health care facility- Exam completed, labs reviewed, vaccines reviewed and updated, colon cancer screening is up-to-date, patient education was given. -     Lipid panel; Future  Chronic systolic heart failure ([place])- He has a normal volume status and excellent blood pressure control.  Will continue irbesartan and carvedilol. -     Discontinue: irbesartan (AVAPRO) 300 MG tablet; Take 1 tablet (300 mg total) by mouth daily. -     Discontinue: carvedilol (COREG) 25 MG tablet; Take 1 tablet (25 mg total) by mouth 2 (two) times daily with a meal.  Type 2 diabetes mellitus with complication, without long-term current use of insulin (HCC) -     Discontinue: irbesartan (AVAPRO) 300 MG tablet; Take 1 tablet (300 mg total) by mouth daily.  Hypertriglyceridemia- I have asked him to start taking icosapent ethyl to lower his triglycerides and to reduce complications such as pancreatitis and for cardiovascular risk reduction. -     Icosapent Ethyl (VASCEPA) 1 g CAPS; Take 2 capsules (2 g total) by mouth 2 (two) times daily.   I have discontinued Arkin Yamashiro's carvedilol, irbesartan, predniSONE, traMADol, and hydrochlorothiazide. I have also changed his levothyroxine. Additionally, I am having him start on Vascepa. Lastly, I am having him maintain his aspirin EC, thiamine, and amLODipine.  Meds ordered this encounter  Medications  . DISCONTD: irbesartan (AVAPRO) 300 MG tablet    Sig: Take 1 tablet (300 mg total) by mouth daily.    Dispense:  90 tablet    Refill:  1  . DISCONTD: hydrochlorothiazide (HYDRODIURIL) 25 MG tablet    Sig: Take 1 tablet (25 mg total) by mouth daily.    Dispense:  90 tablet    Refill:  1  .  DISCONTD: carvedilol (COREG) 25 MG tablet    Sig: Take 1 tablet (25 mg total) by mouth 2  (two) times daily with a meal.    Dispense:  180 tablet    Refill:  1  . levothyroxine (SYNTHROID) 50 MCG tablet    Sig: Take 1 tablet (50 mcg total) by mouth daily before breakfast.    Dispense:  90 tablet    Refill:  0  . Icosapent Ethyl (VASCEPA) 1 g CAPS    Sig: Take 2 capsules (2 g total) by mouth 2 (two) times daily.    Dispense:  360 capsule    Refill:  1     Follow-up: Return in about 6 months (around 08/31/2019).  Scarlette Calico, MD

## 2019-03-02 NOTE — Patient Instructions (Signed)

## 2019-03-07 LAB — VITAMIN B1: Vitamin B1 (Thiamine): 9 nmol/L (ref 8–30)

## 2019-03-11 ENCOUNTER — Telehealth: Payer: Self-pay

## 2019-03-11 NOTE — Telephone Encounter (Signed)
PA was approved and pt informed of same.   Pt also give lab results per my chart message that PCP sent.

## 2019-06-02 NOTE — Progress Notes (Deleted)
HPI: FU nonischemic cardiomyopathy with EF initially at 15-20%. Cardiac catheterization in 03/2010 revealed normal coronary arteries.Last echocardiogram 2/20 showed ejection fraction 50-55%, mild LVH, grade 2 DD and trace aortic insufficiency. Amlodipine added at last ov for hypertension. Since last seen,   Current Outpatient Medications  Medication Sig Dispense Refill  . allopurinol (ZYLOPRIM) 100 MG tablet Take 1 tablet (100 mg total) by mouth daily. 90 tablet 1  . amLODipine (NORVASC) 5 MG tablet Take 1 tablet (5 mg total) by mouth daily. 90 tablet 3  . aspirin EC 81 MG tablet Take 1 tablet (81 mg total) by mouth daily. 30 tablet 0  . atorvastatin (LIPITOR) 40 MG tablet Take 1 tablet (40 mg total) by mouth daily. 90 tablet 1  . carvedilol (COREG) 25 MG tablet Take 1 tablet (25 mg total) by mouth 2 (two) times daily with a meal. 180 tablet 1  . hydrochlorothiazide (HYDRODIURIL) 25 MG tablet Take 1 tablet (25 mg total) by mouth daily. 90 tablet 1  . Icosapent Ethyl (VASCEPA) 1 g CAPS Take 2 capsules (2 g total) by mouth 2 (two) times daily. 360 capsule 1  . irbesartan (AVAPRO) 300 MG tablet Take 1 tablet (300 mg total) by mouth daily. 90 tablet 1  . levothyroxine (SYNTHROID) 50 MCG tablet Take 1 tablet (50 mcg total) by mouth daily before breakfast. 90 tablet 0  . thiamine (VITAMIN B-1) 50 MG tablet Take 1 tablet (50 mg total) by mouth daily. 90 tablet 1   No current facility-administered medications for this visit.     Past Medical History:  Diagnosis Date  . Anemia   . Arthritis   . CHF (congestive heart failure) ([place])   . Chronic renal insufficiency baseline CRE  1.5--1.8   NEPHROLOGIST--  DR WEBB ([facility])  . Diabetes mellitus type 2, insulin dependent (HCC) PER pt and LAST PCP NOTE CBG'S IN 100'   RECENTLY UNCONTROLLED AIC 07-07-2012  9.4 AND CBG'S 400'S  . GERD (gastroesophageal reflux disease)   . Gout, unspecified    stable  per pt  . Heart block AV first  degree   . Heart failure, chronic systolic ([place])   . History of ETOH abuse   . Hypertension    CARDIOLOGIST-  DR Stanford Breed--- LOV IN [facility] 09-08-2011  . Nephrolithiasis    RIGHT  . Non-ischemic cardiomyopathy ([place]) SECONDARY TO HX UNCONTROLLED HTN AND ETOH ABUSE----  LAST ECHO EF 50%  09-22-2011   a) Echo 03/25/2010: EF 20-25%; mild AI; Mod MR; Mild LAE  b)cardiac cath 03/26/2010: normal cors; severe pulmonary HTN;PCWP 41; EF 10-15%;  c. echo 4/12:  EF 45-50%, mild LVH, grade 1 diast dysfxn, mild AI, mild LAE  . Personal history of colonic polyps-adenoma 02/17/2012   01/2012 - diminutive adenoma Repeat colon about 01/2017    . Polycystic kidney disease    BILATERAL RENAL CYST  . Pure hyperglyceridemia     Past Surgical History:  Procedure Laterality Date  . CARDIAC CATHETERIZATION  03-26-2010  DR [place]   SEVERE LV DYSFUNCTION WITH MARKEDLY ELEVATED FILLING PRESSURE/ SEVERE PULMONARY HYPERTENSION/ NORMAL CORONARY ANATOMY  . colonoscopy    . COLONOSCOPY  2013  . CYSTOSCOPY WITH RETROGRADE PYELOGRAM, URETEROSCOPY AND STENT PLACEMENT Right 08/18/2012   Procedure: CYSTOSCOPY WITH RETROGRADE PYELOGRAM, URETEROSCOPY AND STENT PLACEMENT;  Surgeon: Molli Hazard, MD;  Location: [facility];  Service: Urology;  Laterality: Right;  . KIDNEY STONE SURGERY     X 2  .  KNEE ARTHROSCOPY W/ MENISCECTOMY Left 02-06-2011   AND CHONDROPLASTY  . LUMBAR FUSION  08-10-2003   L5  --  S1  . NEPHROLITHOTOMY Right 09/15/2012   Procedure: NEPHROLITHOTOMY PERCUTANEOUS;  Surgeon: Molli Hazard, MD;  Location: [facility];  Service: Urology;  Laterality: Right;  RIGHT PERCUTANEOUS NEPHROSTOLITHOTOMY   . REMOVAL CYST LEFT LUNG  1994   AT [facility]   benign  . RHINOPLASTY  1970'S   nose fx  . TRANSTHORACIC ECHOCARDIOGRAM  09-22-2011  DR Quindarrius [place]   EF 50%/  MILD LVH/ GRADE I DIASTOLIC DYSFUNCTION/ MILD TR    Social History   Socioeconomic History  . Marital status: Married    Spouse name: Not  on file  . Number of children: 1  . Years of education: Not on file  . Highest education level: Not on file  Occupational History  . Occupation: FORK LIFT    Employer: [employer]: [employer]  Tobacco Use  . Smoking status: Current Every Day Smoker    Packs/day: 0.00    Years: 32.00    Pack years: 0.00    Types: Cigars    Last attempt to quit: 02/27/2007    Years since quitting: 12.2  . Smokeless tobacco: Never Used  Substance and Sexual Activity  . Alcohol use: Yes    Alcohol/week: 21.0 standard drinks    Types: 21 Cans of beer per week    Comment: HX ALCOHOL ABUSE--  pt states he drinks 2 beers a day  . Drug use: No  . Sexual activity: Not Currently  Other Topics Concern  . Not on file  Social History Narrative   No regular exercise   Daily caffeine      Social Determinants of Health   Financial Resource Strain:   . Difficulty of Paying Living Expenses: Not on file  Food Insecurity:   . Worried About Charity fundraiser in the Last Year: Not on file  . Ran Out of Food in the Last Year: Not on file  Transportation Needs:   . Lack of Transportation (Medical): Not on file  . Lack of Transportation (Non-Medical): Not on file  Physical Activity:   . Days of Exercise per Week: Not on file  . Minutes of Exercise per Session: Not on file  Stress:   . Feeling of Stress : Not on file  Social Connections:   . Frequency of Communication with Friends and Family: Not on file  . Frequency of Social Gatherings with Friends and Family: Not on file  . Attends Religious Services: Not on file  . Active Member of Clubs or Organizations: Not on file  . Attends Archivist Meetings: Not on file  . Marital Status: Not on file  Intimate Partner Violence:   . Fear of Current or Ex-Partner: Not on file  . Emotionally Abused: Not on file  . Physically Abused: Not on file  . Sexually Abused: Not on file    Family History  Problem Relation Age of Onset    . Diabetes Mother   . Hypertension Mother   . Diabetes Father   . Hypertension Father   . Colon cancer Paternal Grandfather     ROS: no fevers or chills, productive cough, hemoptysis, dysphasia, odynophagia, melena, hematochezia, dysuria, hematuria, rash, seizure activity, orthopnea, PND, pedal edema, claudication. Remaining systems are negative.  Physical Exam: Well-developed well-nourished in no acute distress.  Skin is warm and dry.  HEENT is normal.  Neck  is supple.  Chest is clear to auscultation with normal expansion.  Cardiovascular exam is regular rate and rhythm.  Abdominal exam nontender or distended. No masses palpated. Extremities show no edema. neuro grossly intact  ECG- personally reviewed  A/P  1 nonischemic cardiomyopathy-LV function has improved on most recent echocardiogram.  Continue present dose of ARB and beta-blocker.  2 hypertension-patient's blood pressure is controlled.  Continue present medical regimen.  3 renal insufficiency-patient's BUN and creatinine are followed by primary care.  Kirk Ruths, MD

## 2019-06-07 ENCOUNTER — Encounter: Payer: Self-pay | Admitting: Internal Medicine

## 2019-06-08 ENCOUNTER — Ambulatory Visit: Payer: Managed Care, Other (non HMO) | Admitting: Cardiology

## 2019-06-08 ENCOUNTER — Encounter: Payer: Self-pay | Admitting: Internal Medicine

## 2019-06-22 NOTE — Progress Notes (Signed)
HPI: FU nonischemic cardiomyopathy with EF initially at 15-20%. Cardiac catheterization in 03/2010 revealed normal coronary arteries.Last echocardiogram 2/20 showed ejection fraction 50-55%, mild LVH, grade 2 DD and trace aortic insufficiency. Since last seen, the patient denies any dyspnea on exertion, orthopnea, PND, pedal edema, palpitations, syncope or chest pain.   Current Outpatient Medications  Medication Sig Dispense Refill  . allopurinol (ZYLOPRIM) 100 MG tablet Take 1 tablet (100 mg total) by mouth daily. 90 tablet 1  . aspirin EC 81 MG tablet Take 1 tablet (81 mg total) by mouth daily. 30 tablet 0  . atorvastatin (LIPITOR) 40 MG tablet Take 1 tablet (40 mg total) by mouth daily. 90 tablet 1  . carvedilol (COREG) 25 MG tablet Take 1 tablet (25 mg total) by mouth 2 (two) times daily with a meal. 180 tablet 1  . hydrochlorothiazide (HYDRODIURIL) 25 MG tablet Take 1 tablet (25 mg total) by mouth daily. 90 tablet 1  . Icosapent Ethyl (VASCEPA) 1 g CAPS Take 2 capsules (2 g total) by mouth 2 (two) times daily. 360 capsule 1  . irbesartan (AVAPRO) 300 MG tablet Take 1 tablet (300 mg total) by mouth daily. 90 tablet 1  . levothyroxine (SYNTHROID) 50 MCG tablet Take 1 tablet (50 mcg total) by mouth daily before breakfast. 90 tablet 0  . thiamine (VITAMIN B-1) 50 MG tablet Take 1 tablet (50 mg total) by mouth daily. 90 tablet 1  . amLODipine (NORVASC) 5 MG tablet Take 1 tablet (5 mg total) by mouth daily. 90 tablet 3   No current facility-administered medications for this visit.     Past Medical History:  Diagnosis Date  . Anemia   . Arthritis   . CHF (congestive heart failure) ([place])   . Chronic renal insufficiency baseline CRE  1.5--1.8   NEPHROLOGIST--  DR WEBB ([facility])  . Diabetes mellitus type 2, insulin dependent (HCC) PER pt and LAST PCP NOTE CBG'S IN 100'   RECENTLY UNCONTROLLED AIC 07-07-2012  9.4 AND CBG'S 400'S  . GERD (gastroesophageal reflux disease)   .  Gout, unspecified    stable  per pt  . Heart block AV first degree   . Heart failure, chronic systolic ([place])   . History of ETOH abuse   . Hypertension    CARDIOLOGIST-  DR Stanford Breed--- LOV IN [facility] 09-08-2011  . Nephrolithiasis    RIGHT  . Non-ischemic cardiomyopathy ([place]) SECONDARY TO HX UNCONTROLLED HTN AND ETOH ABUSE----  LAST ECHO EF 50%  09-22-2011   a) Echo 03/25/2010: EF 20-25%; mild AI; Mod MR; Mild LAE  b)cardiac cath 03/26/2010: normal cors; severe pulmonary HTN;PCWP 41; EF 10-15%;  c. echo 4/12:  EF 45-50%, mild LVH, grade 1 diast dysfxn, mild AI, mild LAE  . Personal history of colonic polyps-adenoma 02/17/2012   01/2012 - diminutive adenoma Repeat colon about 01/2017    . Polycystic kidney disease    BILATERAL RENAL CYST  . Pure hyperglyceridemia     Past Surgical History:  Procedure Laterality Date  . CARDIAC CATHETERIZATION  03-26-2010  DR [place]   SEVERE LV DYSFUNCTION WITH MARKEDLY ELEVATED FILLING PRESSURE/ SEVERE PULMONARY HYPERTENSION/ NORMAL CORONARY ANATOMY  . colonoscopy    . COLONOSCOPY  2013  . CYSTOSCOPY WITH RETROGRADE PYELOGRAM, URETEROSCOPY AND STENT PLACEMENT Right 08/18/2012   Procedure: CYSTOSCOPY WITH RETROGRADE PYELOGRAM, URETEROSCOPY AND STENT PLACEMENT;  Surgeon: Molli Hazard, MD;  [facility];  Service: Urology;  Laterality: Right;  . KIDNEY STONE  SURGERY     X 2  . KNEE ARTHROSCOPY W/ MENISCECTOMY Left 02-06-2011   AND CHONDROPLASTY  . LUMBAR FUSION  08-10-2003   L5  --  S1  . NEPHROLITHOTOMY Right 09/15/2012   Procedure: NEPHROLITHOTOMY PERCUTANEOUS;  Surgeon: Molli Hazard, MD;  Location: [facility];  Service: Urology;  Laterality: Right;  RIGHT PERCUTANEOUS NEPHROSTOLITHOTOMY   . REMOVAL CYST LEFT LUNG  1994   AT [facility]   benign  . RHINOPLASTY  1970'S   nose fx  . TRANSTHORACIC ECHOCARDIOGRAM  09-22-2011  DR Marieta Markov   EF 50%/  MILD LVH/ GRADE I DIASTOLIC DYSFUNCTION/ MILD TR    Social History    Socioeconomic History  . Marital status: Married    Spouse name: Not on file  . Number of children: 1  . Years of education: Not on file  . Highest education level: Not on file  Occupational History  . Occupation: FORK LIFT    Employer: [employer]  Tobacco Use  . Smoking status: Current Every Day Smoker    Packs/day: 0.00    Years: 32.00    Pack years: 0.00    Types: Cigars    Last attempt to quit: 02/27/2007    Years since quitting: 12.3  . Smokeless tobacco: Never Used  Substance and Sexual Activity  . Alcohol use: Yes    Alcohol/week: 21.0 standard drinks    Types: 21 Cans of beer per week    Comment: HX ALCOHOL ABUSE--  pt states he drinks 2 beers a day  . Drug use: No  . Sexual activity: Not Currently  Other Topics Concern  . Not on file  Social History Narrative   No regular exercise   Daily caffeine      Social Determinants of Health   Financial Resource Strain:   . Difficulty of Paying Living Expenses: Not on file  Food Insecurity:   . Worried About Charity fundraiser in the Last Year: Not on file  . Ran Out of Food in the Last Year: Not on file  Transportation Needs:   . Lack of Transportation (Medical): Not on file  . Lack of Transportation (Non-Medical): Not on file  Physical Activity:   . Days of Exercise per Week: Not on file  . Minutes of Exercise per Session: Not on file  Stress:   . Feeling of Stress : Not on file  Social Connections:   . Frequency of Communication with Friends and Family: Not on file  . Frequency of Social Gatherings with Friends and Family: Not on file  . Attends Religious Services: Not on file  . Active Member of Clubs or Organizations: Not on file  . Attends Archivist Meetings: Not on file  . Marital Status: Not on file  Intimate Partner Violence:   . Fear of Current or Ex-Partner: Not on file  . Emotionally Abused: Not on file  . Physically Abused: Not on file  . Sexually  Abused: Not on file    Family History  Problem Relation Age of Onset  . Diabetes Mother   . Hypertension Mother   . Diabetes Father   . Hypertension Father   . Colon cancer Paternal Grandfather     ROS: no fevers or chills, productive cough, hemoptysis, dysphasia, odynophagia, melena, hematochezia, dysuria, hematuria, rash, seizure activity, orthopnea, PND, pedal edema, claudication. Remaining systems are negative.  Physical Exam: Well-developed well-nourished in no acute distress.  Skin is warm  and dry.  HEENT is normal.  Neck is supple.  Chest is clear to auscultation with normal expansion.  Cardiovascular exam is regular rate and rhythm.  Abdominal exam nontender or distended. No masses palpated. Extremities show no edema. neuro grossly intact  ECG-sinus bradycardia, left ventricular hypertrophy.  Personally reviewed  A/P  1 nonischemic cardiomyopathy-LV function has improved on most recent echocardiogram.  Continue present dose of ARB and beta-blocker.  2 hypertension-patient's blood pressure is elevated; increase amlodipine to 10 mg daily.  3 renal insufficiency-check bmet.  Kirk Ruths, MD

## 2019-06-29 ENCOUNTER — Other Ambulatory Visit: Payer: Self-pay

## 2019-06-29 ENCOUNTER — Encounter: Payer: Self-pay | Admitting: Cardiology

## 2019-06-29 ENCOUNTER — Encounter: Payer: Self-pay | Admitting: *Deleted

## 2019-06-29 ENCOUNTER — Ambulatory Visit (INDEPENDENT_AMBULATORY_CARE_PROVIDER_SITE_OTHER): Payer: Managed Care, Other (non HMO) | Admitting: Cardiology

## 2019-06-29 VITALS — BP 148/96 | HR 54 | Ht 66.0 in | Wt 181.8 lb

## 2019-06-29 DIAGNOSIS — I428 Other cardiomyopathies: Secondary | ICD-10-CM | POA: Diagnosis not present

## 2019-06-29 DIAGNOSIS — I10 Essential (primary) hypertension: Secondary | ICD-10-CM

## 2019-06-29 MED ORDER — AMLODIPINE BESYLATE 10 MG PO TABS: 10.0000 mg | ORAL_TABLET | Freq: Every day | ORAL | 3 refills | Status: DC

## 2019-06-29 NOTE — Patient Instructions (Signed)
Medication Instructions:  INCREASE AMLODIPINE TO 10 MG ONCE DAILY= 2 OF THE 5 MG TABLETS ONCE DAILY  *If you need a refill on your cardiac medications before your next appointment, please call your pharmacy*  Lab Work: Your physician recommends that you HAVE LAB WORK TODAY  If you have labs (blood work) drawn today and your tests are completely normal, you will receive your results only by: Marland Kitchen MyChart Message (if you have MyChart) OR . A paper copy in the mail If you have any lab test that is abnormal or we need to change your treatment, we will call you to review the results.  Follow-Up: At [facility], you and your health needs are our priority.  As part of our continuing mission to provide you with exceptional heart care, we have created designated Provider Care Teams.  These Care Teams include your primary Cardiologist (physician) and Advanced Practice Providers (APPs -  Physician Assistants and Nurse Practitioners) who all work together to provide you with the care you need, when you need it.  Your next appointment:   12 month(s)  The format for your next appointment:   Either In Person or Virtual  Provider:   Kirk Ruths, MD

## 2019-06-30 ENCOUNTER — Encounter: Payer: Self-pay | Admitting: *Deleted

## 2019-06-30 LAB — COMPREHENSIVE METABOLIC PANEL
ALT: 25 IU/L (ref 0–44)
AST: 25 IU/L (ref 0–40)
Albumin/Globulin Ratio: 1.6 (ref 1.2–2.2)
Albumin: 4.4 g/dL (ref 3.8–4.9)
Alkaline Phosphatase: 79 IU/L (ref 39–117)
BUN/Creatinine Ratio: 13 (ref 9–20)
BUN: 15 mg/dL (ref 6–24)
Bilirubin Total: 0.7 mg/dL (ref 0.0–1.2)
CO2: 24 mmol/L (ref 20–29)
Calcium: 10.2 mg/dL (ref 8.7–10.2)
Chloride: 104 mmol/L (ref 96–106)
Creatinine, Ser: 1.17 mg/dL (ref 0.76–1.27)
GFR calc Af Amer: 78 mL/min/{1.73_m2} (ref >59)
GFR calc non Af Amer: 68 mL/min/{1.73_m2} (ref >59)
Globulin, Total: 2.7 g/dL (ref 1.5–4.5)
Glucose: 125 mg/dL — ABNORMAL HIGH (ref 65–99)
Potassium: 4.5 mmol/L (ref 3.5–5.2)
Sodium: 142 mmol/L (ref 134–144)
Total Protein: 7.1 g/dL (ref 6.0–8.5)

## 2019-07-25 ENCOUNTER — Encounter (HOSPITAL_BASED_OUTPATIENT_CLINIC_OR_DEPARTMENT_OTHER): Payer: Self-pay

## 2019-07-25 ENCOUNTER — Other Ambulatory Visit: Payer: Self-pay

## 2019-07-25 ENCOUNTER — Telehealth: Payer: Self-pay | Admitting: Internal Medicine

## 2019-07-25 ENCOUNTER — Emergency Department (HOSPITAL_BASED_OUTPATIENT_CLINIC_OR_DEPARTMENT_OTHER)
Admission: EM | Admit: 2019-07-25 | Discharge: 2019-07-25 | Disposition: A | Discharge status: Home / Self Care | Payer: Managed Care, Other (non HMO) | Attending: Emergency Medicine | Admitting: Emergency Medicine

## 2019-07-25 ENCOUNTER — Emergency Department (HOSPITAL_BASED_OUTPATIENT_CLINIC_OR_DEPARTMENT_OTHER): Payer: Managed Care, Other (non HMO)

## 2019-07-25 DIAGNOSIS — E1122 Type 2 diabetes mellitus with diabetic chronic kidney disease: Secondary | ICD-10-CM | POA: Insufficient documentation

## 2019-07-25 DIAGNOSIS — N189 Chronic kidney disease, unspecified: Secondary | ICD-10-CM | POA: Insufficient documentation

## 2019-07-25 DIAGNOSIS — I130 Hypertensive heart and chronic kidney disease with heart failure and stage 1 through stage 4 chronic kidney disease, or unspecified chronic kidney disease: Secondary | ICD-10-CM | POA: Diagnosis not present

## 2019-07-25 DIAGNOSIS — F17210 Nicotine dependence, cigarettes, uncomplicated: Secondary | ICD-10-CM | POA: Insufficient documentation

## 2019-07-25 DIAGNOSIS — Z794 Long term (current) use of insulin: Secondary | ICD-10-CM | POA: Insufficient documentation

## 2019-07-25 DIAGNOSIS — I5022 Chronic systolic (congestive) heart failure: Secondary | ICD-10-CM | POA: Diagnosis not present

## 2019-07-25 DIAGNOSIS — M79671 Pain in right foot: Secondary | ICD-10-CM | POA: Insufficient documentation

## 2019-07-25 DIAGNOSIS — Z9861 Coronary angioplasty status: Secondary | ICD-10-CM | POA: Insufficient documentation

## 2019-07-25 DIAGNOSIS — Z7982 Long term (current) use of aspirin: Secondary | ICD-10-CM | POA: Diagnosis not present

## 2019-07-25 DIAGNOSIS — E781 Pure hyperglyceridemia: Secondary | ICD-10-CM | POA: Insufficient documentation

## 2019-07-25 DIAGNOSIS — Z888 Allergy status to other drugs, medicaments and biological substances status: Secondary | ICD-10-CM | POA: Insufficient documentation

## 2019-07-25 DIAGNOSIS — Z79899 Other long term (current) drug therapy: Secondary | ICD-10-CM | POA: Diagnosis not present

## 2019-07-25 MED ORDER — PREDNISONE 10 MG PO TABS: 20.0000 mg | ORAL_TABLET | Freq: Every day | ORAL | 0 refills | Status: DC

## 2019-07-25 MED ORDER — TRAMADOL HCL 50 MG PO TABS: 50.0000 mg | ORAL_TABLET | Freq: Four times a day (QID) | ORAL | 0 refills | Status: DC | PRN

## 2019-07-25 NOTE — ED Provider Notes (Signed)
[facility] EMERGENCY DEPARTMENT Provider Note   CSN: [id number] Arrival date & time: 07/25/19  0957     History Chief Complaint  Patient presents with  . Foot Pain    Mike Orozco is a 59 y.o. male.  HPI 59 year old African-American male presents the ER with a past medical history significant for chronic renal insufficiency, heart failure, diabetes who presents to the emergency department today for evaluation of right foot pain.  Patient states that his pain started 3 days ago.  States it feels like his prior gout flare.  Patient denies any erythema.  Does report eating seafood and crab legs 1 week ago.  Patient states that he is try to take his allopurinol without any significant relief.  Able to walk with this does cause pain.  No fevers or chills.  Patient states that he did stumble down the stairs 1-1/2 weeks ago after drinking.  Patient states that he is unsure if he hurt it from then.  Patient denies any numbness or tingling.  He reports that he did have a flare and August of last year was given prednisone which seemed to help with his symptoms.  Patient has taken no other medication aside the allopurinol for pain.  No other associated symptoms.    Past Medical History:  Diagnosis Date  . Anemia   . Arthritis   . CHF (congestive heart failure) ([place])   . Chronic renal insufficiency baseline CRE  1.5--1.8   NEPHROLOGIST--  DR WEBB ([facility])  . Diabetes mellitus type 2, insulin dependent (HCC) PER pt and LAST PCP NOTE CBG'S IN 100'   RECENTLY UNCONTROLLED AIC 07-07-2012  9.4 AND CBG'S 400'S  . GERD (gastroesophageal reflux disease)   . Gout, unspecified    stable  per pt  . Heart block AV first degree   . Heart failure, chronic systolic ([place])   . History of ETOH abuse   . Hypertension    CARDIOLOGIST-  DR Stanford Breed--- [facility] 09-08-2011  . Nephrolithiasis    RIGHT  . Non-ischemic cardiomyopathy ([place]) SECONDARY TO HX UNCONTROLLED HTN AND ETOH ABUSE----   LAST ECHO EF 50%  09-22-2011   a) Echo 03/25/2010: EF 20-25%; mild AI; Mod MR; Mild LAE  b)cardiac cath 03/26/2010: normal cors; severe pulmonary HTN;PCWP 41; EF 10-15%;  c. echo 4/12:  EF 45-50%, mild LVH, grade 1 diast dysfxn, mild AI, mild LAE  . Personal history of colonic polyps-adenoma 02/17/2012   01/2012 - diminutive adenoma Repeat colon about 01/2017    . Polycystic kidney disease    BILATERAL RENAL CYST  . Pure hyperglyceridemia     Patient Active Problem List   Diagnosis Date Noted  . Hypothyroidism (acquired) 06/02/2018  . Thiamine deficiency 06/29/2017  . Hyperlipidemia with target LDL less than 100 11/13/2015  . Routine general medical examination at a health care facility 11/13/2015  . Hypertriglyceridemia 03/22/2013  . Type II diabetes mellitus with manifestations ([place]) 07/12/2012  . Personal history of colonic polyps-adenoma 02/17/2012  . Chronic renal insufficiency 10/27/2011  . Gout 05/28/2011  . Anemia of chronic disease 12/16/2010  . Kidney stones 12/16/2010  . Allergic rhinitis due to other allergen 06/17/2010  . CARDIOMYOPATHY 04/03/2010  . Chronic systolic heart failure ([place]) 04/03/2010  . Essential hypertension, benign 04/02/2010    Past Surgical History:  Procedure Laterality Date  . CARDIAC CATHETERIZATION  03-26-2010  DR Martinique   SEVERE LV DYSFUNCTION WITH MARKEDLY ELEVATED FILLING PRESSURE/ SEVERE PULMONARY HYPERTENSION/ NORMAL CORONARY ANATOMY  .  colonoscopy    . COLONOSCOPY  2013  . CYSTOSCOPY WITH RETROGRADE PYELOGRAM, URETEROSCOPY AND STENT PLACEMENT Right 08/18/2012   Procedure: CYSTOSCOPY WITH RETROGRADE PYELOGRAM, URETEROSCOPY AND STENT PLACEMENT;  Surgeon: Molli Hazard, MD;  Location: [facility];  Service: Urology;  Laterality: Right;  . KIDNEY STONE SURGERY     X 2  . KNEE ARTHROSCOPY W/ MENISCECTOMY Left 02-06-2011   AND CHONDROPLASTY  . LUMBAR FUSION  08-10-2003   L5  --  S1  . NEPHROLITHOTOMY Right 09/15/2012    Procedure: NEPHROLITHOTOMY PERCUTANEOUS;  Surgeon: Molli Hazard, MD;  Location: [facility];  Service: Urology;  Laterality: Right;  RIGHT PERCUTANEOUS NEPHROSTOLITHOTOMY   . REMOVAL CYST LEFT LUNG  1994   AT [facility]   benign  . RHINOPLASTY  1970'S   nose fx  . TRANSTHORACIC ECHOCARDIOGRAM  09-22-2011  DR CRENSHAW   EF 50%/  MILD LVH/ GRADE I DIASTOLIC DYSFUNCTION/ MILD TR       Family History  Problem Relation Age of Onset  . Diabetes Mother   . Hypertension Mother   . Diabetes Father   . Hypertension Father   . Colon cancer Paternal Grandfather     Social History   Tobacco Use  . Smoking status: Current Every Day Smoker    Packs/day: 0.00    Years: 32.00    Pack years: 0.00    Types: Cigars  . Smokeless tobacco: Never Used  Substance Use Topics  . Alcohol use: Yes    Alcohol/week: 21.0 standard drinks    Types: 21 Cans of beer per week    Comment: daily  . Drug use: No    Home Medications Prior to Admission medications   Medication Sig Start Date End Date Taking? Authorizing Provider  allopurinol (ZYLOPRIM) 100 MG tablet Take 1 tablet (100 mg total) by mouth daily. 03/02/19   Janith Lima, MD  amLODipine (NORVASC) 10 MG tablet Take 1 tablet (10 mg total) by mouth daily. 06/29/19 09/27/19  Lelon Perla, MD  aspirin EC 81 MG tablet Take 1 tablet (81 mg total) by mouth daily. 12/24/17   Janith Lima, MD  atorvastatin (LIPITOR) 40 MG tablet Take 1 tablet (40 mg total) by mouth daily. 03/02/19   Janith Lima, MD  carvedilol (COREG) 25 MG tablet Take 1 tablet (25 mg total) by mouth 2 (two) times daily with a meal. 03/02/19   Janith Lima, MD  hydrochlorothiazide (HYDRODIURIL) 25 MG tablet Take 1 tablet (25 mg total) by mouth daily. 03/02/19   Janith Lima, MD  Icosapent Ethyl (VASCEPA) 1 g CAPS Take 2 capsules (2 g total) by mouth 2 (two) times daily. 03/02/19   Janith Lima, MD  irbesartan (AVAPRO) 300 MG tablet Take 1 tablet (300 mg total) by mouth  daily. 03/02/19   Janith Lima, MD  levothyroxine (SYNTHROID) 50 MCG tablet Take 1 tablet (50 mcg total) by mouth daily before breakfast. 03/02/19   Janith Lima, MD  thiamine (VITAMIN B-1) 50 MG tablet Take 1 tablet (50 mg total) by mouth daily. 06/05/18   Janith Lima, MD    Allergies    Lisinopril, Metformin, and Metformin and related  Review of Systems   Review of Systems  Constitutional: Negative for chills and fever.  Eyes: Negative for discharge.  Respiratory: Negative for cough.   Musculoskeletal: Positive for arthralgias and myalgias.  Skin: Negative for color change.  Neurological: Negative for weakness and numbness.  Psychiatric/Behavioral:  Negative for confusion.    Physical Exam Updated Vital Signs BP (!) 160/97 (BP Location: Right Arm)   Pulse 77   Temp 98.7 F (37.1 C) (Oral)   Resp 16   Ht 5\' 5"  (1.651 m)   Wt 81.6 kg   SpO2 98%   BMI 29.95 kg/m   Physical Exam Vitals and nursing note reviewed.  Constitutional:      General: He is not in acute distress.    Appearance: He is well-developed. He is not ill-appearing or toxic-appearing.  HENT:     Head: Normocephalic and atraumatic.  Eyes:     General: No scleral icterus.       Right eye: No discharge.        Left eye: No discharge.  Cardiovascular:     Pulses: Normal pulses.  Pulmonary:     Effort: No respiratory distress.  Musculoskeletal:        General: Normal range of motion.     Cervical back: Normal range of motion.     Comments: The right great toe he does have swelling and tenderness over the MTP joint.  There is mild erythema and warmth in this area.  No sig pain with rom.  Skin:    General: Skin is warm and dry.     Capillary Refill: Capillary refill takes less than 2 seconds.     Coloration: Skin is not pale.  Neurological:     Mental Status: He is alert.     Sensory: No sensory deficit.     Motor: No weakness.  Psychiatric:        Behavior: Behavior normal.        Thought  Content: Thought content normal.        Judgment: Judgment normal.     ED Results / Procedures / Treatments   Labs (all labs ordered are listed, but only abnormal results are displayed) Labs Reviewed - No data to display  EKG None  Radiology No results found.  Procedures Procedures (including critical care time)  Medications Ordered in ED Medications - No data to display  ED Course  I have reviewed the triage vital signs and the nursing notes.  Pertinent labs & imaging results that were available during my care of the patient were reviewed by me and considered in my medical decision making (see chart for details).    MDM Rules/Calculators/A&P                      59 year old male presents the ER for evaluation of foot pain.  Patient has history of gout.  States this feels like his typical gout flare.  Denies any trauma.  Vital signs reassuring.  Patient neurovascularly intact.  X-ray obtained shows no acute fracture or malalignment.  Patient reports that steroids has helped in the past with his flares.  Will prescribe steroid pack.  We will give short course of pain medication.  Patient needs close follow-up primary care doctor.  Presentation is atypical for septic arthritis at this time.  Patient requesting a 3-day work note.  Pt is hemodynamically stable, in NAD, & able to ambulate in the ED. Evaluation does not show pathology that would require ongoing emergent intervention or inpatient treatment. I explained the diagnosis to the patient. Pain has been managed & has no complaints prior to dc. Pt is comfortable with above plan and is stable for discharge at this time. All questions were answered prior to disposition. Strict return precautions  for f/u to the ED were discussed. Encouraged follow up with PCP.  Final Clinical Impression(s) / ED Diagnoses Final diagnoses:  Foot pain, right    Rx / DC Orders ED Discharge Orders         Ordered    predniSONE (DELTASONE) 10 MG  tablet  Daily     07/25/19 1201    traMADol (ULTRAM) 50 MG tablet  Every 6 hours PRN     07/25/19 1201           Doristine Devoid, PA-C 07/28/19 1137    Tegeler, Gwenyth Allegra, MD 07/28/19 1310

## 2019-07-25 NOTE — Telephone Encounter (Signed)
  Patient calling to get advice on what medication he can take for pain due to gout flare up  Please call

## 2019-07-25 NOTE — ED Triage Notes (Signed)
Pt c/o pain on top of right foot reports history of gout, states that he thinks this is a gout flare up.

## 2019-07-25 NOTE — ED Notes (Signed)
Patient transported to X-ray 

## 2019-07-25 NOTE — Discharge Instructions (Signed)
Your x-ray was normal.  Continue to soak your foot in Epson salt.  Have given you a short course of steroids to see if this helps with your pain.  Please make sure that you are keeping your blood sugar as this can make your blood sugar elevated.  Have given you a short course of stronger pain medication to take at home as well.  This medication can make you drowsy to be careful with driving this medication.  Continue taking the allopurinol.  Follow with primary care doctor.  Return to the ER any worsening symptoms.

## 2019-07-25 NOTE — Telephone Encounter (Signed)
Pt contacted and he stated that the gout is on the top of the foot and he stated that he is in the ED to have his foot looked at right not.

## 2019-07-25 NOTE — Telephone Encounter (Signed)
Pt is in the ED for gout.

## 2019-09-20 ENCOUNTER — Telehealth: Payer: Self-pay | Admitting: Cardiology

## 2019-09-20 MED ORDER — AMLODIPINE BESYLATE 10 MG PO TABS: 10.0000 mg | ORAL_TABLET | Freq: Every day | ORAL | 6 refills | Status: DC

## 2019-09-20 NOTE — Telephone Encounter (Signed)
Spoke with patient . He has decide to get medication to [facility]. #30 day with  6 refills. See other telephone note

## 2019-09-20 NOTE — Telephone Encounter (Signed)
Patient calling requesting samples of amLODipine (NORVASC) 10 MG tablet

## 2019-09-20 NOTE — Telephone Encounter (Signed)
° °  Pt c/o medication issue:  1. Name of Medication: amLODipine (NORVASC) 10 MG tablet  2. How are you currently taking this medication (dosage and times per day)?   3. Are you having a reaction (difficulty breathing--STAT)?   4. What is your medication issue? Pt said he was talking to Shanon about his amlodipine, she told me to call [facility] pharmacy and it is cheaper to refill this medication. He wants to make sure only $9 to refill at [facility]. He wants a callback from her  Please call

## 2019-09-20 NOTE — Telephone Encounter (Signed)
Spoke to patient . Informed patient medication is generic office does not have samples.  patient states his insurance  Does not take affect until July 2021.--  informed patient to call  [facility] how much  Medication out of pocket.  Informed patient if he likes to get medication from [facility] long outpatient pharmacy- it will be  A$9  A month without insurance. If he wants medication called into pharmacy please call back. Patient voiced understanding

## 2019-09-23 MED FILL — AMLODIPINE BESYLATE 10 MG T: 10 | 30 days supply | Qty: 30 | Fill #0

## 2019-09-29 ENCOUNTER — Telehealth: Payer: Self-pay | Admitting: Internal Medicine

## 2019-09-29 NOTE — Telephone Encounter (Signed)
New message:   Pt states he has some questions for the assistant but would not give any further information in regards to what it was about. Please advise.

## 2019-09-30 NOTE — Telephone Encounter (Signed)
Lvm for pt to call back at his convenience.

## 2019-10-06 NOTE — Telephone Encounter (Signed)
LVM for pt to call back as soon as possible.    Doning this note until patient calls back.

## 2019-10-31 ENCOUNTER — Other Ambulatory Visit: Payer: Self-pay

## 2019-10-31 ENCOUNTER — Ambulatory Visit (INDEPENDENT_AMBULATORY_CARE_PROVIDER_SITE_OTHER): Payer: BC Managed Care – PPO | Admitting: Family Medicine

## 2019-10-31 ENCOUNTER — Encounter: Payer: Self-pay | Admitting: Family Medicine

## 2019-10-31 ENCOUNTER — Ambulatory Visit (INDEPENDENT_AMBULATORY_CARE_PROVIDER_SITE_OTHER): Payer: BC Managed Care – PPO

## 2019-10-31 VITALS — BP 130/80 | HR 67 | Ht 65.0 in | Wt 181.0 lb

## 2019-10-31 DIAGNOSIS — M7121 Synovial cyst of popliteal space [Baker], right knee: Secondary | ICD-10-CM

## 2019-10-31 DIAGNOSIS — M25561 Pain in right knee: Secondary | ICD-10-CM

## 2019-10-31 DIAGNOSIS — M1000 Idiopathic gout, unspecified site: Secondary | ICD-10-CM | POA: Diagnosis not present

## 2019-10-31 DIAGNOSIS — M25461 Effusion, right knee: Secondary | ICD-10-CM

## 2019-10-31 NOTE — Progress Notes (Signed)
Mike Orozco is a 59 y.o. male who presents to [place] at [facility] today for R knee pain. Patient would like knee to be drained. Patient has seen Dr. Raeford Razor in the past for foot pain. R knee pain X2 weeks. Works at [employer] and has noticed knee swlling pain located to medial knee. L knee had the same issue and had fluid drained off of it so thinks that could be the cause of the R knee pain. Taking advil for the pain pain worse when walking and is a sharp shooting pain.   Pertinent review of systems: No fevers or chills  Relevant historical information: Heart failure, gout   Exam:  BP 130/80 (BP Location: Left Arm, Patient Position: Sitting, Cuff Size: Normal)   Pulse 67   Ht 5\' 5"  (1.651 m)   Wt 181 lb (82.1 kg)   SpO2 98%   BMI 30.12 kg/m  General: Well Developed, well nourished, and in no acute distress.   MSK: Right knee: Moderate effusion otherwise normal-appearing Range of motion 0-100 degrees. Stable ligamentous exam. Mild tender palpation medial and lateral joint line.     Lab and Radiology Results Diagnostic Limited MSK Ultrasound of: Right knee Quad tendon intact normal-appearing Moderate joint effusion superior patellar space.  Next patellar tendon normal-appearing Medial lateral joint line narrowed degenerative appearing worse than medial. Posterior knee reveals a small Baker's cyst. Impression: DJD joint effusion and small Baker's cyst.   Procedure: Real-time Ultrasound Guided right knee aspiration and injection at superior lateral patellar space. Device: Philips Affiniti 50G Images permanently stored and available for review in the ultrasound unit. Verbal informed consent obtained.  Discussed risks and benefits of procedure. Warned about infection bleeding damage to structures skin hypopigmentation and fat atrophy among others. Patient expresses understanding and agreement Time-out conducted.   Noted no overlying erythema,  induration, or other signs of local infection.   Skin prepped in a sterile fashion.   Local anesthesia: Topical Ethyl chloride.   With sterile technique and under real time ultrasound guidance:  3 mL of lidocaine injected subcutaneously achieving good anesthesia. Skin was again sterilized and 18-gauge needle was used to access the joint effusion and 45 mL of slightly cloudy yellow fluid was aspirated. Syringe was exchanged and 40 mg of Kenalog and 2 mL of Marcaine were injected.   Completed without difficulty   Pain immediately resolved suggesting accurate placement of the medication.   Advised to call if fevers/chills, erythema, induration, drainage, or persistent bleeding.   Images permanently stored and available for review in the ultrasound unit.  Impression: Technically successful ultrasound guided injection.       Assessment and Plan: 59 y.o. male with right knee pain and effusion.  Patient does have degenerative changes on exam and ultrasound examination.  However he also does have gout.  Plan for aspiration and injection today.  We will send fluid analysis off for cell count crystal analysis and differential as well as culture.  Treat with injection as above along with Voltaren gel.  If gout crystals visible on exam we will allopurinol dose to better control gout.  Recheck back with me as needed.   PDMP not reviewed this encounter. Orders Placed This Encounter  Procedures  . Anaerobic and Aerobic Culture    Source right knee effusion    Standing Status:   Future    Standing Expiration Date:   10/30/2020  . [place] LIMITED JOINT SPACE STRUCTURES LOW RIGHT    Standing  Status:   Future    Number of Occurrences:   1    Standing Expiration Date:   10/30/2020    Order Specific Question:   Reason for Exam (SYMPTOM  OR DIAGNOSIS REQUIRED)    Answer:   r knee pain    Order Specific Question:   Preferred imaging location?    Answer:   [place]  . Synovial cell  count + diff, w/ crystals    Standing Status:   Future    Standing Expiration Date:   10/30/2020   No orders of the defined types were placed in this encounter.    Discussed warning signs or symptoms. Please see discharge instructions. Patient expresses understanding.   The above documentation has been reviewed and is accurate and complete Lynne Leader, M.D.

## 2019-10-31 NOTE — Addendum Note (Signed)
Addended by: Trenda Moots on: 01/05/9905 10:45 AM   Modules accepted: Orders

## 2019-10-31 NOTE — Patient Instructions (Signed)
Thank you for coming in today. USe over the counter Voltaren gel on the knee. OK to continue compression sleeve.  If I see gout crystals in the knee fluid I will increase your gout medicine.   Keep me updated.  I am here for you.   Call or go to the ER if you develop a large red swollen joint with extreme pain or oozing puss.

## 2019-11-01 NOTE — Progress Notes (Signed)
No gout crystals seen on fluid aspirate.  Culture is still pending.

## 2019-11-02 ENCOUNTER — Other Ambulatory Visit: Payer: Self-pay | Admitting: Cardiology

## 2019-11-02 DIAGNOSIS — I428 Other cardiomyopathies: Secondary | ICD-10-CM

## 2019-11-06 LAB — SYNOVIAL CELL COUNT + DIFF, W/ CRYSTALS
Basophils, %: 0 %
Eosinophils-Synovial: 0 % (ref 0–2)
Lymphocytes-Synovial Fld: 55 % (ref 0–74)
Monocyte/Macrophage: 10 % (ref 0–69)
Neutrophil, Synovial: 28 % — ABNORMAL HIGH (ref 0–24)
Synoviocytes, %: 7 % (ref 0–15)
WBC, Synovial: 421 cells/uL — ABNORMAL HIGH (ref <150)

## 2019-11-06 LAB — ANAEROBIC AND AEROBIC CULTURE
AER RESULT:: NO GROWTH
GRAM STAIN:: NONE SEEN
MICRO NUMBER:: 10587452
MICRO NUMBER:: 10587453
SPECIMEN QUALITY:: ADEQUATE
SPECIMEN QUALITY:: ADEQUATE

## 2019-11-07 NOTE — Progress Notes (Signed)
Culture is negative.

## 2019-12-06 ENCOUNTER — Ambulatory Visit: Payer: BC Managed Care – PPO | Admitting: Family Medicine

## 2019-12-06 NOTE — Progress Notes (Deleted)
   I, Wendy Poet, LAT, ATC, am serving as scribe for Dr. Lynne Leader.  Mike Orozco is a 59 y.o. male who presents to [place] at [facility] today for f/u of R knee pain and re-occurring L knee pain.  He was last seen by Dr. Georgina Snell on 10/31/19 for his R knee and had a R knee aspiration/injection.  Since his last visit, pt reports  Diagnostic imaging: L knee XR- 07/05/18  Pertinent review of systems: ***  Relevant historical information: ***   Exam:  There were no vitals taken for this visit. General: Well Developed, well nourished, and in no acute distress.   MSK: ***    Lab and Radiology Results No results found for this or any previous visit (from the past 72 hour(s)). No results found.     Assessment and Plan: 59 y.o. male with ***   PDMP not reviewed this encounter. No orders of the defined types were placed in this encounter.  No orders of the defined types were placed in this encounter.    Discussed warning signs or symptoms. Please see discharge instructions. Patient expresses understanding.   ***

## 2019-12-19 ENCOUNTER — Other Ambulatory Visit: Payer: Self-pay

## 2019-12-19 ENCOUNTER — Ambulatory Visit: Payer: Self-pay

## 2019-12-19 ENCOUNTER — Encounter: Payer: Self-pay | Admitting: Family Medicine

## 2019-12-19 ENCOUNTER — Ambulatory Visit: Payer: BC Managed Care – PPO | Admitting: Family Medicine

## 2019-12-19 VITALS — BP 130/80 | HR 68 | Ht 65.0 in | Wt 177.4 lb

## 2019-12-19 DIAGNOSIS — M25562 Pain in left knee: Secondary | ICD-10-CM

## 2019-12-19 NOTE — Progress Notes (Signed)
I, Wendy Poet, LAT, ATC, am serving as scribe for Dr. Lynne Leader.  Mike Orozco is a 59 y.o. male who presents to [employer] at [facility] today for f/u of R knee pain.  He was last seen by Dr. Georgina Snell on 10/31/19 and had a R knee aspiration and injection.  He was advised to use compression and Voltaren gel.  Since his last visit, pt reports that his R knee is feeling better since his last visit.  Today he wants to discuss his L knee.  He locates his pain to his L post knee and reports swelling in his L post knee as well. He notes radiating pain into his L calf.  Aggravating factors include prolonged standing and walking.   Pertinent review of systems: No fevers or chills  Relevant historical information: History of heart failure   Exam:  BP (!) 130/80 (BP Location: Right Arm, Patient Position: Sitting, Cuff Size: Normal)   Pulse 68   Ht 5\' 5"  (1.651 m)   Wt 177 lb 6.4 oz (80.5 kg)   SpO2 96%   BMI 29.52 kg/m  General: Well Developed, well nourished, and in no acute distress.   MSK: Left knee moderate effusion swelling posterior medial knee. Not tender to palpation Range of motion full. Stable ligaments exam. Intact strength. Pulses cap refill and sensation are intact distally.    Lab and Radiology Results Diagnostic Limited MSK Ultrasound of: Left knee Tendon intact normal-appearing Moderate to large joint effusion superior patellar space. Patellar tendon intact. Narrowed degenerative appearing medial and lateral meniscus and joint space. Posterior medial knee reveals a large Baker's cyst. Impression: DJD knee effusion and large Baker's cyst.   Procedure: Real-time Ultrasound Guided aspiration and injection of left knee Baker's cyst Device: Philips Affiniti 50G Images permanently stored and available for review in the ultrasound unit. Verbal informed consent obtained.  Discussed risks and benefits of procedure. Warned about infection bleeding damage  to structures skin hypopigmentation and fat atrophy among others. Patient expresses understanding and agreement Time-out conducted.   Noted no overlying erythema, induration, or other signs of local infection.   Skin prepped in a sterile fashion.   Local anesthesia: Topical Ethyl chloride.   With sterile technique and under real time ultrasound guidance:  2 mL of lidocaine injected achieving good anesthesia. Skin was again sterilized and 18-gauge needle was used to access the Baker's cyst. 15 mL of clear yellow liquid aspirated. Syringe exchange and 40 mg of Kenalog and 2 mL of Marcaine were injected.   Completed without difficulty   Pain immediately resolved suggesting accurate placement of the medication.   Advised to call if fevers/chills, erythema, induration, drainage, or persistent bleeding.   Images permanently stored and available for review in the ultrasound unit.  Impression: Technically successful ultrasound guided injection.       Assessment and Plan: 59 y.o. male with left knee pain due to effusion DJD and Baker's cyst.  Baker's cyst aspirated and injected today.  Optimistic this will provide better pain relief.  Recheck back as needed.   PDMP not reviewed this encounter. Orders Placed This Encounter  Procedures  . [place] LIMITED JOINT SPACE STRUCTURES LOW LEFT(NO LINKED CHARGES)    Order Specific Question:   Reason for Exam (SYMPTOM  OR DIAGNOSIS REQUIRED)    Answer:   L knee pain    Order Specific Question:   Preferred imaging location?    Answer:   [place]   No  orders of the defined types were placed in this encounter.    Discussed warning signs or symptoms. Please see discharge instructions. Patient expresses understanding.   The above documentation has been reviewed and is accurate and complete Lynne Leader, M.D.

## 2019-12-19 NOTE — Patient Instructions (Addendum)
You had a L knee injection today.  Call or go to the ER if you develop a large red swollen joint with extreme pain or oozing puss.   Edmonia Lynch with me as needed.  Use compression on that knee.  Compression sleeve you worse for your right knee should be ok for the left.    Baker Cyst  A Baker cyst, also called a popliteal cyst, is a growth that forms at the back of the knee. The cyst forms when the fluid-filled sac (bursa) that cushions the knee joint becomes enlarged. What are the causes? In most cases, a Baker cyst results from another knee problem that causes swelling inside the knee. This makes the fluid inside the knee joint (synovial fluid) flow into the bursa behind the knee, causing the bursa to enlarge. What increases the risk? You may be more likely to develop a Baker cyst if you already have a knee problem, such as:  A tear in cartilage that cushions the knee joint (meniscal tear).  A tear in the tissues that connect the bones of the knee joint (ligament tear).  Knee swelling from osteoarthritis, rheumatoid arthritis, or gout. What are the signs or symptoms? The main symptom of this condition is a lump behind the knee. This may be the only symptom of the condition. The lump may be painful, especially when the knee is straightened. If the lump is painful, the pain may come and go. The knee may also be stiff. Symptoms may quickly get more severe if the cyst breaks open (ruptures). If the cyst ruptures, you may feel the following in your knee and calf:  Sudden or worsening pain.  Swelling.  Bruising.  Redness in the calf. A Baker cyst does not always cause symptoms. How is this diagnosed? This condition may be diagnosed based on your symptoms and medical history. Your health care provider will also do a physical exam. This may include:  Feeling the cyst to check whether it is tender.  Checking your knee for signs of another knee condition that causes swelling. You may have  imaging tests, such as:  X-rays.  MRI.  Ultrasound. How is this treated? A Baker cyst that is not painful may go away without treatment. If the cyst gets large or painful, it will likely get better if the underlying knee problem is treated. If needed, treatment for a Baker cyst may include:  Resting.  Keeping weight off of the knee. This means not leaning on the knee to support your body weight.  Taking NSAIDs, such as ibuprofen, to reduce pain and swelling.  Having a procedure to drain the fluid from the cyst with a needle (aspiration). You may also get an injection of a medicine that reduces swelling (steroid).  Having surgery. This may be needed if other treatments do not work. This usually involves correcting knee damage and removing the cyst. Follow these instructions at home:  Activity  Rest as told by your health care provider.  Avoid activities that make pain or swelling worse.  Return to your normal activities as told by your health care provider. Ask your health care provider what activities are safe for you.  Do not use the injured limb to support your body weight until your health care provider says that you can. Use crutches as told by your health care provider. General instructions  Take over-the-counter and prescription medicines only as told by your health care provider.  Keep all follow-up visits as told by your health  care provider. This is important. Contact a health care provider if:  You have knee pain, stiffness, or swelling that does not get better. Get help right away if:  You have sudden or worsening pain and swelling in your calf area. Summary  A Baker cyst, also called a popliteal cyst, is a growth that forms at the back of the knee.  In most cases, a Baker cyst results from another knee problem that causes swelling inside the knee.  A Baker cyst that is not painful may go away without treatment.  If needed, treatment for a Baker cyst may  include resting, keeping weight off of the knee, medicines, or draining fluid from the cyst.  Surgery may be needed if other treatments are not effective. This information is not intended to replace advice given to you by your health care provider. Make sure you discuss any questions you have with your health care provider. Document Revised: 09/17/2018 Document Reviewed: 09/17/2018 Elsevier Patient Education  [place].

## 2020-03-19 ENCOUNTER — Telehealth: Payer: Self-pay | Admitting: Internal Medicine

## 2020-03-19 NOTE — Telephone Encounter (Signed)
Called pt, LVM stating that an OV is needed for r/f

## 2020-03-19 NOTE — Telephone Encounter (Signed)
Patient is requesting a med refill for hydrochlorothiazide (HYDRODIURIL) 25 MG tablet  It can be sent to  [id number] - [place], [place] - [address], [address]    Please call the patient if rx is sent [phone]

## 2020-03-20 ENCOUNTER — Ambulatory Visit: Payer: BC Managed Care – PPO | Admitting: Internal Medicine

## 2020-03-20 ENCOUNTER — Other Ambulatory Visit: Payer: Self-pay | Admitting: Internal Medicine

## 2020-03-20 DIAGNOSIS — I10 Essential (primary) hypertension: Secondary | ICD-10-CM

## 2020-03-26 ENCOUNTER — Other Ambulatory Visit: Payer: Self-pay

## 2020-03-26 ENCOUNTER — Encounter: Payer: Self-pay | Admitting: Internal Medicine

## 2020-03-26 ENCOUNTER — Ambulatory Visit (INDEPENDENT_AMBULATORY_CARE_PROVIDER_SITE_OTHER): Payer: BC Managed Care – PPO | Admitting: Internal Medicine

## 2020-03-26 VITALS — BP 142/88 | HR 67 | Temp 98.6°F | Ht 65.0 in | Wt 179.0 lb

## 2020-03-26 DIAGNOSIS — D638 Anemia in other chronic diseases classified elsewhere: Secondary | ICD-10-CM

## 2020-03-26 DIAGNOSIS — E781 Pure hyperglyceridemia: Secondary | ICD-10-CM

## 2020-03-26 DIAGNOSIS — I10 Essential (primary) hypertension: Secondary | ICD-10-CM | POA: Diagnosis not present

## 2020-03-26 DIAGNOSIS — E785 Hyperlipidemia, unspecified: Secondary | ICD-10-CM

## 2020-03-26 DIAGNOSIS — E039 Hypothyroidism, unspecified: Secondary | ICD-10-CM | POA: Diagnosis not present

## 2020-03-26 DIAGNOSIS — Z23 Encounter for immunization: Secondary | ICD-10-CM

## 2020-03-26 DIAGNOSIS — M1000 Idiopathic gout, unspecified site: Secondary | ICD-10-CM | POA: Diagnosis not present

## 2020-03-26 DIAGNOSIS — N182 Chronic kidney disease, stage 2 (mild): Secondary | ICD-10-CM | POA: Diagnosis not present

## 2020-03-26 DIAGNOSIS — E519 Thiamine deficiency, unspecified: Secondary | ICD-10-CM

## 2020-03-26 DIAGNOSIS — I5022 Chronic systolic (congestive) heart failure: Secondary | ICD-10-CM

## 2020-03-26 DIAGNOSIS — E118 Type 2 diabetes mellitus with unspecified complications: Secondary | ICD-10-CM | POA: Diagnosis not present

## 2020-03-26 DIAGNOSIS — E1165 Type 2 diabetes mellitus with hyperglycemia: Secondary | ICD-10-CM

## 2020-03-26 DIAGNOSIS — Z0000 Encounter for general adult medical examination without abnormal findings: Secondary | ICD-10-CM | POA: Diagnosis not present

## 2020-03-26 LAB — CBC WITH DIFFERENTIAL/PLATELET
Basophils Absolute: 0 10*3/uL (ref 0.0–0.1)
Basophils Relative: 0.7 % (ref 0.0–3.0)
Eosinophils Absolute: 0.1 10*3/uL (ref 0.0–0.7)
Eosinophils Relative: 1.9 % (ref 0.0–5.0)
HCT: 37.5 % — ABNORMAL LOW (ref 39.0–52.0)
Hemoglobin: 12.7 g/dL — ABNORMAL LOW (ref 13.0–17.0)
Lymphocytes Relative: 40.4 % (ref 12.0–46.0)
Lymphs Abs: 2 10*3/uL (ref 0.7–4.0)
MCHC: 34 g/dL (ref 30.0–36.0)
MCV: 93.5 fl (ref 78.0–100.0)
Monocytes Absolute: 0.4 10*3/uL (ref 0.1–1.0)
Monocytes Relative: 7.6 % (ref 3.0–12.0)
Neutro Abs: 2.4 10*3/uL (ref 1.4–7.7)
Neutrophils Relative %: 49.4 % (ref 43.0–77.0)
Platelets: 277 10*3/uL (ref 150.0–400.0)
RBC: 4 Mil/uL — ABNORMAL LOW (ref 4.22–5.81)
RDW: 12.8 % (ref 11.5–15.5)
WBC: 4.9 10*3/uL (ref 4.0–10.5)

## 2020-03-26 LAB — LIPID PANEL
Cholesterol: 196 mg/dL (ref 0–200)
HDL: 46.8 mg/dL (ref >39.00)
NonHDL: 149.2
Total CHOL/HDL Ratio: 4
Triglycerides: 235 mg/dL — ABNORMAL HIGH (ref 0.0–149.0)
VLDL: 47 mg/dL — ABNORMAL HIGH (ref 0.0–40.0)

## 2020-03-26 LAB — BASIC METABOLIC PANEL
BUN: 13 mg/dL (ref 6–23)
CO2: 30 mEq/L (ref 19–32)
Calcium: 9.6 mg/dL (ref 8.4–10.5)
Chloride: 100 mEq/L (ref 96–112)
Creatinine, Ser: 1.13 mg/dL (ref 0.40–1.50)
GFR: 70.67 mL/min (ref >60.00)
Glucose, Bld: 166 mg/dL — ABNORMAL HIGH (ref 70–99)
Potassium: 3.8 mEq/L (ref 3.5–5.1)
Sodium: 138 mEq/L (ref 135–145)

## 2020-03-26 LAB — TSH: TSH: 4.71 u[IU]/mL — ABNORMAL HIGH (ref 0.35–4.50)

## 2020-03-26 LAB — URINALYSIS, ROUTINE W REFLEX MICROSCOPIC
Bilirubin Urine: NEGATIVE
Hgb urine dipstick: NEGATIVE
Ketones, ur: NEGATIVE
Leukocytes,Ua: NEGATIVE
Nitrite: NEGATIVE
Specific Gravity, Urine: 1.025 (ref 1.000–1.030)
Total Protein, Urine: 30 — AB
Urine Glucose: NEGATIVE
Urobilinogen, UA: 0.2 (ref 0.0–1.0)
pH: 6 (ref 5.0–8.0)

## 2020-03-26 LAB — MICROALBUMIN / CREATININE URINE RATIO
Creatinine,U: 163.7 mg/dL
Microalb Creat Ratio: 20.7 mg/g (ref 0.0–30.0)
Microalb, Ur: 33.9 mg/dL — ABNORMAL HIGH (ref 0.0–1.9)

## 2020-03-26 LAB — HEPATIC FUNCTION PANEL
ALT: 21 U/L (ref 0–53)
AST: 20 U/L (ref 0–37)
Albumin: 4.4 g/dL (ref 3.5–5.2)
Alkaline Phosphatase: 65 U/L (ref 39–117)
Bilirubin, Direct: 0.1 mg/dL (ref 0.0–0.3)
Total Bilirubin: 0.7 mg/dL (ref 0.2–1.2)
Total Protein: 6.9 g/dL (ref 6.0–8.3)

## 2020-03-26 LAB — PSA: PSA: 2.16 ng/mL (ref 0.10–4.00)

## 2020-03-26 LAB — FERRITIN: Ferritin: 415.9 ng/mL — ABNORMAL HIGH (ref 22.0–322.0)

## 2020-03-26 LAB — LDL CHOLESTEROL, DIRECT: Direct LDL: 108 mg/dL

## 2020-03-26 LAB — VITAMIN B12: Vitamin B-12: 370 pg/mL (ref 211–911)

## 2020-03-26 LAB — URIC ACID: Uric Acid, Serum: 8.1 mg/dL — ABNORMAL HIGH (ref 4.0–7.8)

## 2020-03-26 LAB — FOLATE: Folate: 9.7 ng/mL (ref >5.9)

## 2020-03-26 LAB — IRON: Iron: 100 ug/dL (ref 42–165)

## 2020-03-26 LAB — HEMOGLOBIN A1C: Hgb A1c MFr Bld: 6.5 % (ref 4.6–6.5)

## 2020-03-26 MED ORDER — ICOSAPENT ETHYL 1 G PO CAPS: 2.0000 g | ORAL_CAPSULE | Freq: Two times a day (BID) | ORAL | 1 refills | Status: AC

## 2020-03-26 MED ORDER — LEVOTHYROXINE SODIUM 50 MCG PO TABS: 50.0000 ug | ORAL_TABLET | Freq: Every day | ORAL | 0 refills | Status: AC

## 2020-03-26 MED ORDER — DAPAGLIFLOZIN PROPANEDIOL 10 MG PO TABS: 10.0000 mg | ORAL_TABLET | Freq: Every day | ORAL | 1 refills | Status: DC

## 2020-03-26 MED ORDER — ATORVASTATIN CALCIUM 40 MG PO TABS: 40.0000 mg | ORAL_TABLET | Freq: Every day | ORAL | 1 refills | Status: DC

## 2020-03-26 MED ORDER — CARVEDILOL 3.125 MG PO TABS: 3.1250 mg | ORAL_TABLET | Freq: Two times a day (BID) | ORAL | 0 refills | Status: DC

## 2020-03-26 MED ORDER — ALLOPURINOL 100 MG PO TABS: 100.0000 mg | ORAL_TABLET | Freq: Every day | ORAL | 1 refills | Status: AC

## 2020-03-26 MED ORDER — CANDESARTAN CILEXETIL 16 MG PO TABS: 16.0000 mg | ORAL_TABLET | Freq: Every day | ORAL | 1 refills | Status: DC

## 2020-03-26 NOTE — Progress Notes (Signed)
Subjective:  Patient ID: Mike Orozco, male    DOB: 1960/08/27  Age: 59 y.o. MRN: [id number]  CC: Annual Exam, Anemia, Hyperlipidemia, Diabetes, Hypothyroidism, and Hypertension  This visit occurred during the SARS-CoV-2 public health emergency.  Safety protocols were in place, including screening questions prior to the visit, additional usage of staff PPE, and extensive cleaning of exam room while observing appropriate contact time as indicated for disinfecting solutions.    HPI Mike Orozco presents for a CPX.  He has a history of hypothyroidism but is not currently taking a thyroid supplement.  He complains of weight gain but denies fatigue, constipation, edema, or excessive sleepiness.  He is active and denies any recent episodes of chest pain, shortness of breath, palpitations, or edema.  It sounds like he is taking amlodipine but not irbesartan, hydrochlorothiazide, or carvedilol.   Outpatient Medications Prior to Visit  Medication Sig Dispense Refill  . aspirin EC 81 MG tablet Take 1 tablet (81 mg total) by mouth daily. 30 tablet 0  . thiamine (VITAMIN B-1) 50 MG tablet Take 1 tablet (50 mg total) by mouth daily. 90 tablet 1  . allopurinol (ZYLOPRIM) 100 MG tablet Take 1 tablet (100 mg total) by mouth daily. 90 tablet 1  . atorvastatin (LIPITOR) 40 MG tablet Take 1 tablet (40 mg total) by mouth daily. 90 tablet 1  . carvedilol (COREG) 25 MG tablet Take 1 tablet (25 mg total) by mouth 2 (two) times daily with a meal. 180 tablet 1  . hydrochlorothiazide (HYDRODIURIL) 25 MG tablet TAKE 1 TABLET BY MOUTH EVERY DAY 90 tablet 0  . Icosapent Ethyl (VASCEPA) 1 g CAPS Take 2 capsules (2 g total) by mouth 2 (two) times daily. 360 capsule 1  . irbesartan (AVAPRO) 300 MG tablet Take 1 tablet (300 mg total) by mouth daily. 90 tablet 1  . levothyroxine (SYNTHROID) 50 MCG tablet Take 1 tablet (50 mcg total) by mouth daily before breakfast. 90 tablet 0  . predniSONE (DELTASONE) 10 MG tablet Take  2 tablets (20 mg total) by mouth daily. 8 tablet 0  . traMADol (ULTRAM) 50 MG tablet Take 1 tablet (50 mg total) by mouth every 6 (six) hours as needed. 6 tablet 0  . amLODipine (NORVASC) 10 MG tablet Take 1 tablet (10 mg total) by mouth daily. 30 tablet 6  . amLODipine (NORVASC) 10 MG tablet Take 1 tablet (10 mg total) by mouth daily. 90 tablet 3   No facility-administered medications prior to visit.    ROS Review of Systems  Constitutional: Positive for unexpected weight change. Negative for appetite change, chills, diaphoresis and fatigue.  HENT: Negative.  Negative for sore throat and trouble swallowing.   Eyes: Negative for visual disturbance.  Respiratory: Negative for cough, chest tightness, shortness of breath and wheezing.   Cardiovascular: Negative for chest pain, palpitations and leg swelling.  Gastrointestinal: Negative for abdominal pain, blood in stool, constipation, diarrhea, nausea and vomiting.  Endocrine: Negative.   Genitourinary: Negative.  Negative for difficulty urinating, dysuria, scrotal swelling and testicular pain.  Musculoskeletal: Negative.  Negative for arthralgias and myalgias.  Skin: Negative.   Allergic/Immunologic: Negative.   Neurological: Negative.  Negative for dizziness and weakness.  Hematological: Negative for adenopathy. Does not bruise/bleed easily.  Psychiatric/Behavioral: Negative.     Objective:  BP (!) 142/88   Pulse 67   Temp 98.6 F (37 C) (Oral)   Ht 5\' 5"  (1.651 m)   Wt 179 lb (81.2 kg)   SpO2 97%  BMI 29.79 kg/m   BP Readings from Last 3 Encounters:  03/26/20 (!) 142/88  12/19/19 (!) 130/80  10/31/19 130/80    Wt Readings from Last 3 Encounters:  03/26/20 179 lb (81.2 kg)  12/19/19 177 lb 6.4 oz (80.5 kg)  10/31/19 181 lb (82.1 kg)    Physical Exam Vitals reviewed.  Constitutional:      Appearance: Normal appearance.  HENT:     Nose: Nose normal.     Mouth/Throat:     Mouth: Mucous membranes are moist.  Eyes:       General: No scleral icterus.    Conjunctiva/sclera: Conjunctivae normal.  Cardiovascular:     Rate and Rhythm: Normal rate and regular rhythm.     Heart sounds: No murmur heard.   Pulmonary:     Effort: Pulmonary effort is normal.     Breath sounds: No stridor. No wheezing, rhonchi or rales.  Abdominal:     General: Abdomen is protuberant. Bowel sounds are normal. There is no distension.     Palpations: Abdomen is soft. There is no hepatomegaly, splenomegaly or mass.     Tenderness: There is no abdominal tenderness.     Hernia: There is no hernia in the left inguinal area or right inguinal area.  Genitourinary:    Pubic Area: No rash.      Penis: Normal and uncircumcised.      Testes: Normal.        Right: Mass or tenderness not present.        Left: Mass or tenderness not present.     Epididymis:     Right: Normal. Not inflamed or enlarged. No mass.     Left: Normal. Not inflamed or enlarged. No mass.     Prostate: Normal. Not enlarged, not tender and no nodules present.     Rectum: Guaiac result positive. Internal hemorrhoid present. No mass, tenderness, anal fissure or external hemorrhoid. Normal anal tone.  Musculoskeletal:        General: Normal range of motion.     Cervical back: Neck supple.     Right lower leg: No edema.     Left lower leg: No edema.  Lymphadenopathy:     Lower Body: No right inguinal adenopathy. No left inguinal adenopathy.  Skin:    General: Skin is warm and dry.  Neurological:     General: No focal deficit present.     Mental Status: He is alert.  Psychiatric:        Mood and Affect: Mood normal.        Behavior: Behavior normal.     Lab Results  Component Value Date   WBC 4.9 03/26/2020   HGB 12.7 (L) 03/26/2020   HCT 37.5 (L) 03/26/2020   PLT 277.0 03/26/2020   GLUCOSE 166 (H) 03/26/2020   CHOL 196 03/26/2020   TRIG 235.0 (H) 03/26/2020   HDL 46.80 03/26/2020   LDLDIRECT 108.0 03/26/2020   LDLCALC UNABLE TO CALCULATE IF  TRIGLYCERIDE OVER 400 mg/dL 03/23/2013   ALT 21 03/26/2020   AST 20 03/26/2020   NA 138 03/26/2020   K 3.8 03/26/2020   CL 100 03/26/2020   CREATININE 1.13 03/26/2020   BUN 13 03/26/2020   CO2 30 03/26/2020   TSH 4.71 (H) 03/26/2020   PSA 2.16 03/26/2020   INR 0.94 01/15/2018   HGBA1C 6.5 03/26/2020   MICROALBUR 33.9 (H) 03/26/2020    DG Foot Complete Right  Result Date: 07/25/2019 CLINICAL DATA:  Anterior foot  pain for a few days with no injury. EXAM: RIGHT FOOT COMPLETE - 3+ VIEW COMPARISON:  None. FINDINGS: No fracture or bone lesion. Joints normally spaced and aligned.  No arthropathic changes. Normal soft tissues. IMPRESSION: Negative. Electronically Signed   By: Lajean Manes M.D.   On: 07/25/2019 11:50    Assessment & Plan:   Mike Orozco was seen today for annual exam, anemia, hyperlipidemia, diabetes, hypothyroidism and hypertension.  Diagnoses and all orders for this visit:  Essential hypertension, benign-he has not achieved his blood pressure goal of 130/80.  I recommended that he add carvedilol and an ARB to amlodipine.  He will start taking an SGLT2 inhibitor so anticipate this will help him achieve his blood pressure goal.  He has a history of gout so we will try to avoid the thiazide diuretics. -     Basic metabolic panel; Future -     Urinalysis, Routine w reflex microscopic; Future -     carvedilol (COREG) 3.125 MG tablet; Take 1 tablet (3.125 mg total) by mouth 2 (two) times daily with a meal. -     Urinalysis, Routine w reflex microscopic -     Basic metabolic panel  Hypothyroidism (acquired)- His TSH is mildly elevated and he complains of weight gain.  I recommended that he restart TRT. -     TSH; Future -     TSH -     levothyroxine (SYNTHROID) 50 MCG tablet; Take 1 tablet (50 mcg total) by mouth daily before breakfast.  Type II diabetes mellitus with manifestations ([place])- His A1c is 6.5%.  His blood sugar is adequately well controlled.  I recommended that he start  taking an SGLT2 inhibitor. -     Basic metabolic panel; Future -     Hemoglobin A1c; Future -     Microalbumin / creatinine urine ratio; Future -     Microalbumin / creatinine urine ratio -     Hemoglobin A1c -     Basic metabolic panel -     dapagliflozin propanediol (FARXIGA) 10 MG TABS tablet; Take 1 tablet (10 mg total) by mouth daily before breakfast. -     atorvastatin (LIPITOR) 40 MG tablet; Take 1 tablet (40 mg total) by mouth daily. -     candesartan (ATACAND) 16 MG tablet; Take 1 tablet (16 mg total) by mouth daily.  Uncontrolled type 2 diabetes mellitus with hyperglycemia (HCC)  Chronic renal impairment, stage 2 (mild)- His renal function has improved slightly.  We will continue to address risk factor modifications. -     Urinalysis, Routine w reflex microscopic; Future -     Urinalysis, Routine w reflex microscopic  Anemia of chronic disease- There is blood in his stool but a previous GI work-up was unremarkable.  I think the blood in the stool is from internal hemorrhoids.  His H&H are stable.  I will monitor him for vitamin deficiencies. -     CBC with Differential/Platelet; Future -     Iron; Future -     Vitamin B12; Future -     Folate; Future -     Ferritin; Future -     Ferritin -     Folate -     Vitamin B12 -     Iron -     CBC with Differential/Platelet  Idiopathic gout, unspecified chronicity, unspecified site- His uric acid level is elevated.  I recommended that he restart his xanthine oxidase inhibitor. -     Uric acid;  Future -     Uric acid -     allopurinol (ZYLOPRIM) 100 MG tablet; Take 1 tablet (100 mg total) by mouth daily.  Hyperlipidemia with target LDL less than 100- He has achieved his LDL goal and is doing well on the statin. -     Hepatic function panel; Future -     Hepatic function panel -     atorvastatin (LIPITOR) 40 MG tablet; Take 1 tablet (40 mg total) by mouth daily.  Thiamine deficiency- He remains mildly anemic.  I will monitor his  thiamine level. -     CBC with Differential/Platelet; Future -     Vitamin B1; Future -     Vitamin B1 -     CBC with Differential/Platelet  Routine general medical examination at a health care facility- Exam completed, labs reviewed, vaccines reviewed and updated, cancer screenings are up-to-date, patient education was given. -     Lipid panel; Future -     PSA; Future -     PSA -     Lipid panel  Chronic systolic heart failure ([place])- He has a normal volume status.  I recommended that he stay on the calcium channel blocker but to restart carvedilol and an ARB.  I recommended that he also start taking an SGLT2 inhibitor. -     carvedilol (COREG) 3.125 MG tablet; Take 1 tablet (3.125 mg total) by mouth 2 (two) times daily with a meal. -     dapagliflozin propanediol (FARXIGA) 10 MG TABS tablet; Take 1 tablet (10 mg total) by mouth daily before breakfast. -     candesartan (ATACAND) 16 MG tablet; Take 1 tablet (16 mg total) by mouth daily.  Type 2 diabetes mellitus with complication, without long-term current use of insulin (HCC)  Hypertriglyceridemia- His triglycerides are too high.  I recommended that he take icosapent ethyl for cardiovascular risk reduction. -     icosapent Ethyl (VASCEPA) 1 g capsule; Take 2 capsules (2 g total) by mouth 2 (two) times daily.  Other orders -     Flu Vaccine QUAD 6+ mos PF IM (Fluarix Quad PF) -     LDL cholesterol, direct   I have discontinued Mike Orozco's irbesartan, carvedilol, predniSONE, traMADol, and hydrochlorothiazide. I have also changed his Vascepa to icosapent Ethyl. Additionally, I am having him start on carvedilol, dapagliflozin propanediol, and candesartan. Lastly, I am having him maintain his aspirin EC, thiamine, amLODipine, atorvastatin, allopurinol, and levothyroxine.  Meds ordered this encounter  Medications  . carvedilol (COREG) 3.125 MG tablet    Sig: Take 1 tablet (3.125 mg total) by mouth 2 (two) times daily with a meal.     Dispense:  180 tablet    Refill:  0  . dapagliflozin propanediol (FARXIGA) 10 MG TABS tablet    Sig: Take 1 tablet (10 mg total) by mouth daily before breakfast.    Dispense:  90 tablet    Refill:  1  . atorvastatin (LIPITOR) 40 MG tablet    Sig: Take 1 tablet (40 mg total) by mouth daily.    Dispense:  90 tablet    Refill:  1  . allopurinol (ZYLOPRIM) 100 MG tablet    Sig: Take 1 tablet (100 mg total) by mouth daily.    Dispense:  90 tablet    Refill:  1  . icosapent Ethyl (VASCEPA) 1 g capsule    Sig: Take 2 capsules (2 g total) by mouth 2 (two) times daily.  Dispense:  360 capsule    Refill:  1  . levothyroxine (SYNTHROID) 50 MCG tablet    Sig: Take 1 tablet (50 mcg total) by mouth daily before breakfast.    Dispense:  90 tablet    Refill:  0  . candesartan (ATACAND) 16 MG tablet    Sig: Take 1 tablet (16 mg total) by mouth daily.    Dispense:  90 tablet    Refill:  1   In addition to time spent on CPE, I spent 50 minutes in preparing to see the patient by review of recent labs, imaging and procedures, obtaining and reviewing separately obtained history, communicating with the patient and family or caregiver, ordering medications, tests or procedures, and documenting clinical information in the EHR including the differential Dx, treatment, and any further evaluation and other management of 1. Essential hypertension, benign 2. Hypothyroidism (acquired) 3. Type II diabetes mellitus with manifestations ([place]) 4. Chronic renal impairment, stage 2 (mild) 5. Anemia of chronic disease 6. Idiopathic gout, unspecified chronicity, unspecified site 7. Hyperlipidemia with target LDL less than 100 8. Thiamine deficiency 9. Chronic systolic heart failure ([place]) 10. Type 2 diabetes mellitus with complication, without long-term current use of insulin ([place]) 11. Hypertriglyceridemia     Follow-up: Return in about 3 months (around 06/26/2020).  Mike Calico, MD

## 2020-03-26 NOTE — Patient Instructions (Signed)

## 2020-03-28 ENCOUNTER — Telehealth: Payer: Self-pay | Admitting: Internal Medicine

## 2020-03-28 NOTE — Telephone Encounter (Signed)
RX advanced calling for more information on the prior authorization for the  icosapent Ethyl (VASCEPA) 1 g capsule Wants to see if he has tried any other medications than this one. [id number]

## 2020-03-30 NOTE — Telephone Encounter (Signed)
I have called and informed them that he has tried the omega 3 acid (Lovaza) in 2014 and it was ineffective. They stated the information will be reviewed and the a determination will be made.

## 2020-04-01 NOTE — Addendum Note (Signed)
Addended by: Janith Lima on: 04/01/2020 12:11 PM   Modules accepted: Orders

## 2020-04-03 ENCOUNTER — Encounter: Payer: Self-pay | Admitting: Internal Medicine

## 2020-04-03 NOTE — Telephone Encounter (Signed)
Medication has been approved 03/30/20-03/29/20.  Determination sent to scan.

## 2020-06-19 ENCOUNTER — Encounter (HOSPITAL_BASED_OUTPATIENT_CLINIC_OR_DEPARTMENT_OTHER): Payer: Self-pay

## 2020-06-19 ENCOUNTER — Other Ambulatory Visit: Payer: Self-pay

## 2020-06-19 ENCOUNTER — Emergency Department (HOSPITAL_BASED_OUTPATIENT_CLINIC_OR_DEPARTMENT_OTHER)
Admission: EM | Admit: 2020-06-19 | Discharge: 2020-06-19 | Disposition: A | Discharge status: Home / Self Care | Payer: BC Managed Care – PPO | Attending: Emergency Medicine | Admitting: Emergency Medicine

## 2020-06-19 ENCOUNTER — Emergency Department (HOSPITAL_BASED_OUTPATIENT_CLINIC_OR_DEPARTMENT_OTHER): Payer: BC Managed Care – PPO

## 2020-06-19 DIAGNOSIS — I5022 Chronic systolic (congestive) heart failure: Secondary | ICD-10-CM | POA: Diagnosis not present

## 2020-06-19 DIAGNOSIS — M79671 Pain in right foot: Secondary | ICD-10-CM | POA: Insufficient documentation

## 2020-06-19 DIAGNOSIS — I110 Hypertensive heart disease with heart failure: Secondary | ICD-10-CM | POA: Diagnosis not present

## 2020-06-19 DIAGNOSIS — F17210 Nicotine dependence, cigarettes, uncomplicated: Secondary | ICD-10-CM | POA: Insufficient documentation

## 2020-06-19 DIAGNOSIS — E039 Hypothyroidism, unspecified: Secondary | ICD-10-CM | POA: Diagnosis not present

## 2020-06-19 DIAGNOSIS — E119 Type 2 diabetes mellitus without complications: Secondary | ICD-10-CM | POA: Insufficient documentation

## 2020-06-19 MED ORDER — DICLOFENAC SODIUM 1 % EX GEL: 2.0000 g | Freq: Four times a day (QID) | CUTANEOUS | 0 refills | Status: DC | PRN

## 2020-06-19 MED ORDER — TRAMADOL HCL 50 MG PO TABS
50.0000 mg | ORAL_TABLET | Freq: Four times a day (QID) | ORAL | 0 refills | Status: DC | PRN
Start: 2020-06-19 — End: 2020-07-03

## 2020-06-19 NOTE — ED Provider Notes (Signed)
Emergency Department Provider Note   I have reviewed the triage vital signs and the nursing notes.   HISTORY  Chief Complaint Foot Pain   HPI Mike Orozco is a 60 y.o. male with past medical history reviewed below presents to the emergency department with right lateral foot pain.  Symptoms began 1 week prior without obvious injury.  Patient works at [employer] and is on his feet and active throughout the day.  He initially thought this could be gout, which he has had before, and treated with allopurinol.  The redness and swelling which had initially developed have resolved but continues to have pain in this area.  Pain is moderate and worse with walking.  Denies any fevers or chills.  No pain or swelling in other joints.  No additional injury.   Past Medical History:  Diagnosis Date  . Anemia   . Arthritis   . CHF (congestive heart failure) (HCC)   . Chronic renal insufficiency baseline CRE  1.5--1.8   NEPHROLOGIST--  DR WEBB ([facility])  . Diabetes mellitus type 2, insulin dependent (HCC) PER pt and LAST PCP NOTE CBG'S IN 100'   RECENTLY UNCONTROLLED AIC 07-07-2012  9.4 AND CBG'S 400'S  . GERD (gastroesophageal reflux disease)   . Gout, unspecified    stable  per pt  . Heart block AV first degree   . Heart failure, chronic systolic (HCC)   . History of ETOH abuse   . Hypertension    CARDIOLOGIST-  DR Jens Som--- LOV IN [facility] 09-08-2011  . Nephrolithiasis    RIGHT  . Non-ischemic cardiomyopathy (HCC) SECONDARY TO HX UNCONTROLLED HTN AND ETOH ABUSE----  LAST ECHO EF 50%  09-22-2011   a) Echo 03/25/2010: EF 20-25%; mild AI; Mod MR; Mild LAE  b)cardiac cath 03/26/2010: normal cors; severe pulmonary HTN;PCWP 41; EF 10-15%;  c. echo 4/12:  EF 45-50%, mild LVH, grade 1 diast dysfxn, mild AI, mild LAE  . Personal history of colonic polyps-adenoma 02/17/2012   01/2012 - diminutive adenoma Repeat colon about 01/2017    . Polycystic kidney disease    BILATERAL RENAL CYST  . Pure  hyperglyceridemia     Patient Active Problem List   Diagnosis Date Noted  . Hypothyroidism (acquired) 06/02/2018  . Thiamine deficiency 06/29/2017  . Hyperlipidemia with target LDL less than 100 11/13/2015  . Routine general medical examination at a health care facility 11/13/2015  . Hypertriglyceridemia 03/22/2013  . Type II diabetes mellitus with manifestations (HCC) 07/12/2012  . Personal history of colonic polyps-adenoma 02/17/2012  . Chronic renal insufficiency 10/27/2011  . Gout 05/28/2011  . Anemia of chronic disease 12/16/2010  . Kidney stones 12/16/2010  . Allergic rhinitis due to other allergen 06/17/2010  . CARDIOMYOPATHY 04/03/2010  . Chronic systolic heart failure (HCC) 04/03/2010  . Essential hypertension, benign 04/02/2010    Past Surgical History:  Procedure Laterality Date  . CARDIAC CATHETERIZATION  03-26-2010  DR [place]   SEVERE LV DYSFUNCTION WITH MARKEDLY ELEVATED FILLING PRESSURE/ SEVERE PULMONARY HYPERTENSION/ NORMAL CORONARY ANATOMY  . colonoscopy    . COLONOSCOPY  2013  . CYSTOSCOPY WITH RETROGRADE PYELOGRAM, URETEROSCOPY AND STENT PLACEMENT Right 08/18/2012   Procedure: CYSTOSCOPY WITH RETROGRADE PYELOGRAM, URETEROSCOPY AND STENT PLACEMENT;  Surgeon: Milford Cage, MD;  Location: [facility];  Service: Urology;  Laterality: Right;  . KIDNEY STONE SURGERY     X 2  . KNEE ARTHROSCOPY W/ MENISCECTOMY Left 02-06-2011   AND CHONDROPLASTY  . LUMBAR FUSION  08-10-2003  L5  --  S1  . NEPHROLITHOTOMY Right 09/15/2012   Procedure: NEPHROLITHOTOMY PERCUTANEOUS;  Surgeon: Molli Hazard, MD;  Location: [facility];  Service: Urology;  Laterality: Right;  RIGHT PERCUTANEOUS NEPHROSTOLITHOTOMY   . REMOVAL CYST LEFT LUNG  1994   AT [facility]   benign  . RHINOPLASTY  1970'S   nose fx  . TRANSTHORACIC ECHOCARDIOGRAM  09-22-2011  DR CRENSHAW   EF 50%/  MILD LVH/ GRADE I DIASTOLIC DYSFUNCTION/ MILD TR    Allergies Lisinopril, Metformin, and  Metformin and related  Family History  Problem Relation Age of Onset  . Diabetes Mother   . Hypertension Mother   . Diabetes Father   . Hypertension Father   . Colon cancer Paternal Grandfather     Social History Social History   Tobacco Use  . Smoking status: Current Every Day Smoker    Packs/day: 0.00    Years: 32.00    Pack years: 0.00    Types: Cigars  . Smokeless tobacco: Never Used  Vaping Use  . Vaping Use: Never used  Substance Use Topics  . Alcohol use: Yes    Alcohol/week: 21.0 standard drinks    Types: 21 Cans of beer per week    Comment: daily  . Drug use: No    Review of Systems  Constitutional: No fever/chills Cardiovascular: Denies chest pain. Respiratory: Denies shortness of breath. Gastrointestinal: No abdominal pain.  No nausea, no vomiting.  No diarrhea.  No constipation. Musculoskeletal: Negative for back pain. Positive right lateral foot pain.  Skin: Negative for rash. Foot swelling/redness resolved.  Neurological: Negative for headaches, focal weakness or numbness.  ____________________________________________   PHYSICAL EXAM:  VITAL SIGNS: ED Triage Vitals  Enc Vitals Group     BP 06/19/20 1106 (!) 138/97     Pulse Rate 06/19/20 1106 85     Resp 06/19/20 1106 18     Temp 06/19/20 1106 98.3 F (36.8 C)     Temp Source 06/19/20 1106 Oral     SpO2 06/19/20 1106 100 %     Weight 06/19/20 1107 175 lb (79.4 kg)     Height 06/19/20 1107 5\' 6"  (1.676 m)   Constitutional: Alert and oriented. Well appearing and in no acute distress. Eyes: Conjunctivae are normal. Head: Atraumatic. Nose: No congestion/rhinnorhea. Mouth/Throat: Mucous membranes are moist. Neck: No stridor.  Cardiovascular: Good peripheral circulation in the right foot.  Respiratory: Normal respiratory effort.  Gastrointestinal: No distention.  Musculoskeletal: No right foot swelling or redness. No ankle edema. Tenderness over the 5th metatarsal on the right.  Neurologic:   Normal speech and language.  Skin:  Skin is warm, dry and intact. No rash noted.  ____________________________________________  RADIOLOGY  DG Foot Complete Right  Result Date: 06/19/2020 CLINICAL DATA:  Acute right foot pain. EXAM: RIGHT FOOT COMPLETE - 3+ VIEW COMPARISON:  July 25, 2019. FINDINGS: There is no evidence of fracture or dislocation. There is no evidence of arthropathy or other focal bone abnormality. Soft tissues are unremarkable. IMPRESSION: Negative. Electronically Signed   By: Marijo Conception M.D.   On: 06/19/2020 11:46    ____________________________________________   PROCEDURES  Procedure(s) performed:   Procedures  None  ____________________________________________   INITIAL IMPRESSION / ASSESSMENT AND PLAN / ED COURSE  Pertinent labs & imaging results that were available during my care of the patient were reviewed by me and considered in my medical decision making (see chart for details).   Patient presents to the emergency department  with right foot pain.  The foot is not swollen, red, warm to touch.  Doubt infection or gout.  Plain film obtained to rule out stress or nondisplaced fracture.  The plain films were negative.  Patient is ambulatory here with some discomfort.  Reviewed the [facility] drug database and patient has tolerated tramadol in the past.  Provided a very small dose of this medication for him but gave strict precautions not to drive or operate machinery while taking this.  He cannot be on this medication while at work and he verbalized understanding of this.  He will continue Tylenol for pain.  He is listed in the chart to have some chronic kidney disease and so discussed that NSAIDs may worsen this.  Will do topical meds at home. Plan for PCP and Sports Med follow up. Discussed ED return precautions.    ____________________________________________  FINAL CLINICAL IMPRESSION(S) / ED DIAGNOSES  Final diagnoses:  Right foot pain    NEW  OUTPATIENT MEDICATIONS STARTED DURING THIS VISIT:  Discharge Medication List as of 06/19/2020 12:01 PM    START taking these medications   Details  diclofenac Sodium (VOLTAREN) 1 % GEL Apply 2 g topically 4 (four) times daily as needed (pain)., Starting Tue 06/19/2020, Normal    traMADol (ULTRAM) 50 MG tablet Take 1 tablet (50 mg total) by mouth every 6 (six) hours as needed for severe pain., Starting Tue 06/19/2020, Normal        Note:  This document was prepared using Dragon voice recognition software and may include unintentional dictation errors.  Nanda Quinton, MD, [facility]    Dawson Albers, Wonda Olds, MD 06/19/20 [phone]

## 2020-06-19 NOTE — ED Triage Notes (Signed)
Pt arrives with c/o pain to top of right foot states that he had a gout flair up about a week ago and has been taking his Allopurinol, reports the swelling has improved but the pain is still there.

## 2020-06-19 NOTE — Discharge Instructions (Signed)
You were seen in the emerge department today with continued foot pain.  Your x-rays did not show any broken bones.  I am calling on a medicine that you can rub on the foot to reduce pain and other discomfort.  I have called in a prescription to take as needed for severe pain but you cannot take this if you will be driving a car or operating any machinery.  You cannot take this medication with other pain medicines or with alcohol.   Please follow with your primary care doctor.  I listed the name of a sports medicine physician to call if your symptoms continue to linger or worsen.

## 2020-06-26 ENCOUNTER — Telehealth: Payer: Self-pay | Admitting: Family Medicine

## 2020-06-26 NOTE — Telephone Encounter (Signed)
Called pt to offer ED follow up appt w/ Dr Valentino Hue for Rt foot pain/ injury--no answer, msg left.  -glh

## 2020-06-30 ENCOUNTER — Other Ambulatory Visit: Payer: Self-pay | Admitting: Cardiology

## 2020-06-30 DIAGNOSIS — I428 Other cardiomyopathies: Secondary | ICD-10-CM

## 2020-07-02 ENCOUNTER — Telehealth: Payer: Self-pay | Admitting: Cardiology

## 2020-07-02 NOTE — Progress Notes (Signed)
HPI: FU nonischemic cardiomyopathy with EF initially at 15-20%. Cardiac catheterization in 03/2010 revealed normal coronary arteries.Last echocardiogram2/20showed ejection fraction50-55%, mild LVH, grade 2 DDand traceaortic insufficiency. Called office with CP and added to my schedule. Sincelast seen,  patient denies dyspnea on exertion, orthopnea, PND or pedal edema.  He works at [employer] and recently while working developed pain in his chest.  The pain was persistent for 5 days.  It increased with certain movements of his hands and upper extremities as well as his upper torso.  It improved with Tylenol.  Current Outpatient Medications  Medication Sig Dispense Refill   allopurinol (ZYLOPRIM) 100 MG tablet Take 1 tablet (100 mg total) by mouth daily. 90 tablet 1   amLODipine (NORVASC) 10 MG tablet Take 1 tablet (10 mg total) by mouth daily. 30 tablet 6   aspirin EC 81 MG tablet Take 1 tablet (81 mg total) by mouth daily. 30 tablet 0   atorvastatin (LIPITOR) 40 MG tablet Take 1 tablet (40 mg total) by mouth daily. 90 tablet 1   candesartan (ATACAND) 16 MG tablet Take 1 tablet (16 mg total) by mouth daily. 90 tablet 1   carvedilol (COREG) 3.125 MG tablet Take 1 tablet (3.125 mg total) by mouth 2 (two) times daily with a meal. 180 tablet 0   dapagliflozin propanediol (FARXIGA) 10 MG TABS tablet Take 1 tablet (10 mg total) by mouth daily before breakfast. 90 tablet 1   icosapent Ethyl (VASCEPA) 1 g capsule Take 2 capsules (2 g total) by mouth 2 (two) times daily. 360 capsule 1   levothyroxine (SYNTHROID) 50 MCG tablet Take 1 tablet (50 mcg total) by mouth daily before breakfast. 90 tablet 0   No current facility-administered medications for this visit.     Past Medical History:  Diagnosis Date   Anemia    Arthritis    CHF (congestive heart failure) ([place])    Chronic renal insufficiency baseline CRE  1.5--1.8   NEPHROLOGIST--  DR WEBB ([facility])   Diabetes  mellitus type 2, insulin dependent (HCC) PER pt and LAST PCP NOTE CBG'S IN 100'   RECENTLY UNCONTROLLED AIC 07-07-2012  9.4 AND CBG'S 400'S   GERD (gastroesophageal reflux disease)    Gout, unspecified    stable  per pt   Heart block AV first degree    Heart failure, chronic systolic ([place])    History of ETOH abuse    Hypertension    CARDIOLOGIST-  DR Stanford Breed--- LOV IN [facility] 09-08-2011   Nephrolithiasis    RIGHT   Non-ischemic cardiomyopathy ([place]) SECONDARY TO HX UNCONTROLLED HTN AND ETOH ABUSE----  LAST ECHO EF 50%  09-22-2011   a) Echo 03/25/2010: EF 20-25%; mild AI; Mod MR; Mild LAE  b)cardiac cath 03/26/2010: normal cors; severe pulmonary HTN;PCWP 41; EF 10-15%;  c. echo 4/12:  EF 45-50%, mild LVH, grade 1 diast dysfxn, mild AI, mild LAE   Personal history of colonic polyps-adenoma 02/17/2012   01/2012 - diminutive adenoma Repeat colon about 01/2017     Polycystic kidney disease    BILATERAL RENAL CYST   Pure hyperglyceridemia     Past Surgical History:  Procedure Laterality Date   CARDIAC CATHETERIZATION  03-26-2010  DR [place]   SEVERE LV DYSFUNCTION WITH MARKEDLY ELEVATED FILLING PRESSURE/ SEVERE PULMONARY HYPERTENSION/ NORMAL CORONARY ANATOMY   colonoscopy     COLONOSCOPY  2013   CYSTOSCOPY WITH RETROGRADE PYELOGRAM, URETEROSCOPY AND STENT PLACEMENT Right 08/18/2012   Procedure: CYSTOSCOPY WITH RETROGRADE PYELOGRAM,  URETEROSCOPY AND STENT PLACEMENT;  Surgeon: Molli Hazard, MD;  Location: [facility];  Service: Urology;  Laterality: Right;   KIDNEY STONE SURGERY     X 2   KNEE ARTHROSCOPY W/ MENISCECTOMY Left 02-06-2011   AND CHONDROPLASTY   LUMBAR FUSION  08-10-2003   L5  --  S1   NEPHROLITHOTOMY Right 09/15/2012   Procedure: NEPHROLITHOTOMY PERCUTANEOUS;  Surgeon: Molli Hazard, MD;  Location: [facility];  Service: Urology;  Laterality: Right;  RIGHT PERCUTANEOUS NEPHROSTOLITHOTOMY    REMOVAL CYST LEFT LUNG  1994   AT [facility]   benign    RHINOPLASTY  1970'S   nose fx   TRANSTHORACIC ECHOCARDIOGRAM  09-22-2011  DR Clydine Parkison   EF 50%/  MILD LVH/ GRADE I DIASTOLIC DYSFUNCTION/ MILD TR    Social History   Socioeconomic History   Marital status: Married    Spouse name: Not on file   Number of children: 1   Years of education: Not on file   Highest education level: Not on file  Occupational History   Occupation: [employer]    Employer: [place]: [employer]  Tobacco Use   Smoking status: Current Every Day Smoker    Packs/day: 0.00    Years: 32.00    Pack years: 0.00    Types: Cigars   Smokeless tobacco: Never Used  Scientific laboratory technician Use: Never used  Substance and Sexual Activity   Alcohol use: Yes    Alcohol/week: 21.0 standard drinks    Types: 21 Cans of beer per week    Comment: daily   Drug use: No   Sexual activity: Not Currently  Other Topics Concern   Not on file  Social History Narrative   No regular exercise   Daily caffeine      Social Determinants of Health   Financial Resource Strain: Not on file  Food Insecurity: Not on file  Transportation Needs: Not on file  Physical Activity: Not on file  Stress: Not on file  Social Connections: Not on file  Intimate Partner Violence: Not on file    Family History  Problem Relation Age of Onset   Diabetes Mother    Hypertension Mother    Diabetes Father    Hypertension Father    Colon cancer Paternal Grandfather     ROS: no fevers or chills, productive cough, hemoptysis, dysphasia, odynophagia, melena, hematochezia, dysuria, hematuria, rash, seizure activity, orthopnea, PND, pedal edema, claudication. Remaining systems are negative.  Physical Exam: Well-developed well-nourished in no acute distress.  Skin is warm and dry.  HEENT is normal.  Neck is supple.  Chest is clear to auscultation with normal expansion.  Cardiovascular exam is regular rate and rhythm.  Abdominal exam nontender or  distended. No masses palpated. Extremities show no edema. neuro grossly intact  ECG-sinus rhythm at a rate of 65, normal axis, no ST changes, left ventricular hypertrophy.  Personally reviewed  A/P  1 Chest pain-electrocardiogram shows no ST changes.  Symptoms most consistent with musculoskeletal pain.  2 NICM-continue ARB and beta blocker (increase carvedilol to 6.25 mg twice daily); repeat echocardiogram.  3 hypertension-continue present blood pressure medications and follow.  4 h/o renal insuff-creatinine 1.13 on most recent laboratories.  Kirk Ruths, MD

## 2020-07-02 NOTE — Telephone Encounter (Signed)
Called patient, he states that his achy chest pains came on last week Wednesday or Thursday. He states it is a soreness feelings, and not a sharp pain. He denies SOB, and swelling, he states he does do some activity at work with [employer] (like pulling, and pushing all day) no new changes to medications. His BP has been up and down- he does not have the numbers, just that they have been elevated. He did notify his work and seen his work Tax adviser, and they put him on light duty, but before he returns back to work on Wednesday he would like to see only Dr.Crenshaw to make sure his heart is cleared for him to return back to work normally. He is due for his yearly follow up appointment with Dr.Crenshaw- appointment opened tomorrow at 10:00 AM. Patient placed in this appointment, will send to MD and nurse to make aware.

## 2020-07-02 NOTE — Telephone Encounter (Signed)
Pt c/o medication issue:  1. Name of Medication: carvedilol (COREG) 3.125 MG tablet  2. How are you currently taking this medication (dosage and times per day)?  1 tablet (3.125 mg total) once daily 3. Are you having a reaction (difficulty breathing--STAT)? No   4. What is your medication issue? Patient states in September, 2021 Dr. Ronnald Ramp changed his carvedilol from 25 mg to 3.125 mg and he assumes the adjustment is now causing chest pain.   Pt c/o of Chest Pain: STAT if CP now or developed within 24 hours  1. Are you having CP right now? No   2. Are you experiencing any other symptoms (ex. SOB, nausea, vomiting, sweating)? No   3. How long have you been experiencing CP? Past few weeks, per patient  4. Is your CP continuous or coming and going? Coming and going   5. Have you taken Nitroglycerin? No

## 2020-07-03 ENCOUNTER — Encounter: Payer: Self-pay | Admitting: *Deleted

## 2020-07-03 ENCOUNTER — Other Ambulatory Visit: Payer: Self-pay

## 2020-07-03 ENCOUNTER — Encounter: Payer: Self-pay | Admitting: Cardiology

## 2020-07-03 ENCOUNTER — Ambulatory Visit (INDEPENDENT_AMBULATORY_CARE_PROVIDER_SITE_OTHER): Payer: BC Managed Care – PPO | Admitting: Cardiology

## 2020-07-03 VITALS — BP 128/86 | HR 65 | Ht 65.0 in | Wt 176.0 lb

## 2020-07-03 DIAGNOSIS — I10 Essential (primary) hypertension: Secondary | ICD-10-CM

## 2020-07-03 DIAGNOSIS — R072 Precordial pain: Secondary | ICD-10-CM | POA: Diagnosis not present

## 2020-07-03 DIAGNOSIS — I428 Other cardiomyopathies: Secondary | ICD-10-CM | POA: Diagnosis not present

## 2020-07-03 DIAGNOSIS — I5022 Chronic systolic (congestive) heart failure: Secondary | ICD-10-CM

## 2020-07-03 MED ORDER — CARVEDILOL 6.25 MG PO TABS: 6.2500 mg | ORAL_TABLET | Freq: Two times a day (BID) | ORAL | 3 refills | Status: DC

## 2020-07-03 NOTE — Patient Instructions (Signed)
Medication Instructions:   INCREASE CARVEDILOL TO 6.25 MG TWICE DAILY= 2 OF THE 3.125 MG TABLETS TWICE DAILY  *If you need a refill on your cardiac medications before your next appointment, please call your pharmacy*   Testing/Procedures:  Your physician has requested that you have an echocardiogram. Echocardiography is a painless test that uses sound waves to create images of your heart. It provides your doctor with information about the size and shape of your heart and how well your heart's chambers and valves are working. This procedure takes approximately one hour. There are no restrictions for this procedure.[place]     Follow-Up: At [facility], you and your health needs are our priority.  As part of our continuing mission to provide you with exceptional heart care, we have created designated Provider Care Teams.  These Care Teams include your primary Cardiologist (physician) and Advanced Practice Providers (APPs -  Physician Assistants and Nurse Practitioners) who all work together to provide you with the care you need, when you need it.  We recommend signing up for the patient portal called "MyChart".  Sign up information is provided on this After Visit Summary.  MyChart is used to connect with patients for Virtual Visits (Telemedicine).  Patients are able to view lab/test results, encounter notes, upcoming appointments, etc.  Non-urgent messages can be sent to your provider as well.   To learn more about what you can do with MyChart, go to [email or web address].    Your next appointment:   12 month(s)  The format for your next appointment:   In Person  Provider:   Kirk Ruths, MD

## 2020-07-12 ENCOUNTER — Other Ambulatory Visit: Payer: Self-pay | Admitting: Internal Medicine

## 2020-07-12 DIAGNOSIS — I10 Essential (primary) hypertension: Secondary | ICD-10-CM

## 2020-07-30 ENCOUNTER — Ambulatory Visit (HOSPITAL_COMMUNITY): Payer: BC Managed Care – PPO | Attending: Cardiology

## 2020-07-30 ENCOUNTER — Other Ambulatory Visit: Payer: Self-pay

## 2020-07-30 ENCOUNTER — Other Ambulatory Visit: Payer: Self-pay | Admitting: Internal Medicine

## 2020-07-30 DIAGNOSIS — I10 Essential (primary) hypertension: Secondary | ICD-10-CM

## 2020-07-30 DIAGNOSIS — I5022 Chronic systolic (congestive) heart failure: Secondary | ICD-10-CM | POA: Diagnosis not present

## 2020-07-30 LAB — ECHOCARDIOGRAM COMPLETE
Area-P 1/2: 4.24 cm2
P 1/2 time: 814 msec
S' Lateral: 4.2 cm

## 2020-07-31 ENCOUNTER — Telehealth: Payer: Self-pay | Admitting: *Deleted

## 2020-07-31 NOTE — Telephone Encounter (Addendum)
Left message for pt to call to discuss echo results.    ----- Message from Lelon Perla, MD sent at 07/30/2020  3:06 PM EDT ----- Make sure he has elective fuov next 2-4 months Kirk Ruths

## 2020-08-01 ENCOUNTER — Other Ambulatory Visit: Payer: Self-pay | Admitting: Internal Medicine

## 2020-08-01 DIAGNOSIS — I10 Essential (primary) hypertension: Secondary | ICD-10-CM

## 2020-08-06 NOTE — Telephone Encounter (Signed)
Patient returning call. He states he will be at work from [employer] today and won't be able to answer the phone.

## 2020-08-06 NOTE — Telephone Encounter (Signed)
Spoke with pt, echo results discussed. Follow up scheduled

## 2020-08-20 ENCOUNTER — Other Ambulatory Visit: Payer: Self-pay | Admitting: Internal Medicine

## 2020-08-20 DIAGNOSIS — I10 Essential (primary) hypertension: Secondary | ICD-10-CM

## 2020-09-10 NOTE — Progress Notes (Signed)
HPI: FU nonischemic cardiomyopathy with EF initially at 15-20%. Cardiac catheterization in 03/2010 revealed normal coronary arteries.Most recent echocardiogram March 2022 showed ejection fraction 45 to 50%, mild left ventricular enlargement, mild left ventricular hypertrophy, grade 2 diastolic dysfunction, mild aortic insufficiency and mildly dilated aortic root.  Sincelast seen,patient denies dyspnea, chest pain, palpitations or syncope.  Current Outpatient Medications  Medication Sig Dispense Refill  . allopurinol (ZYLOPRIM) 100 MG tablet Take 1 tablet (100 mg total) by mouth daily. 90 tablet 1  . amLODipine (NORVASC) 10 MG tablet TAKE 1 TABLET BY MOUTH EVERY DAY 90 tablet 3  . aspirin EC 81 MG tablet Take 1 tablet (81 mg total) by mouth daily. 30 tablet 0  . atorvastatin (LIPITOR) 40 MG tablet Take 1 tablet (40 mg total) by mouth daily. 90 tablet 1  . candesartan (ATACAND) 16 MG tablet Take 1 tablet (16 mg total) by mouth daily. 90 tablet 1  . carvedilol (COREG) 6.25 MG tablet Take 1 tablet (6.25 mg total) by mouth 2 (two) times daily with a meal. 180 tablet 3  . dapagliflozin propanediol (FARXIGA) 10 MG TABS tablet Take 1 tablet (10 mg total) by mouth daily before breakfast. 90 tablet 1  . icosapent Ethyl (VASCEPA) 1 g capsule Take 2 capsules (2 g total) by mouth 2 (two) times daily. 360 capsule 1  . levothyroxine (SYNTHROID) 50 MCG tablet Take 1 tablet (50 mcg total) by mouth daily before breakfast. 90 tablet 0   No current facility-administered medications for this visit.     Past Medical History:  Diagnosis Date  . Anemia   . Arthritis   . CHF (congestive heart failure) ([place])   . Chronic renal insufficiency baseline CRE  1.5--1.8   NEPHROLOGIST--  DR WEBB ([facility])  . Diabetes mellitus type 2, insulin dependent (HCC) PER pt and LAST PCP NOTE CBG'S IN 100'   RECENTLY UNCONTROLLED AIC 07-07-2012  9.4 AND CBG'S 400'S  . GERD (gastroesophageal reflux disease)   .  Gout, unspecified    stable  per pt  . Heart block AV first degree   . Heart failure, chronic systolic ([place])   . History of ETOH abuse   . Hypertension    CARDIOLOGIST-  DR Stanford Breed--- LOV IN [facility] 09-08-2011  . Nephrolithiasis    RIGHT  . Non-ischemic cardiomyopathy ([place]) SECONDARY TO HX UNCONTROLLED HTN AND ETOH ABUSE----  LAST ECHO EF 50%  09-22-2011   a) Echo 03/25/2010: EF 20-25%; mild AI; Mod MR; Mild LAE  b)cardiac cath 03/26/2010: normal cors; severe pulmonary HTN;PCWP 41; EF 10-15%;  c. echo 4/12:  EF 45-50%, mild LVH, grade 1 diast dysfxn, mild AI, mild LAE  . Personal history of colonic polyps-adenoma 02/17/2012   01/2012 - diminutive adenoma Repeat colon about 01/2017    . Polycystic kidney disease    BILATERAL RENAL CYST  . Pure hyperglyceridemia     Past Surgical History:  Procedure Laterality Date  . CARDIAC CATHETERIZATION  03-26-2010  DR Martinique   SEVERE LV DYSFUNCTION WITH MARKEDLY ELEVATED FILLING PRESSURE/ SEVERE PULMONARY HYPERTENSION/ NORMAL CORONARY ANATOMY  . colonoscopy    . COLONOSCOPY  2013  . CYSTOSCOPY WITH RETROGRADE PYELOGRAM, URETEROSCOPY AND STENT PLACEMENT Right 08/18/2012   Procedure: CYSTOSCOPY WITH RETROGRADE PYELOGRAM, URETEROSCOPY AND STENT PLACEMENT;  Surgeon: Molli Hazard, MD;  Location: [facility];  Service: Urology;  Laterality: Right;  . KIDNEY STONE SURGERY     X 2  . KNEE ARTHROSCOPY W/ MENISCECTOMY  Left 02-06-2011   AND CHONDROPLASTY  . LUMBAR FUSION  08-10-2003   L5  --  S1  . NEPHROLITHOTOMY Right 09/15/2012   Procedure: NEPHROLITHOTOMY PERCUTANEOUS;  Surgeon: Molli Hazard, MD;  Location: [facility];  Service: Urology;  Laterality: Right;  RIGHT PERCUTANEOUS NEPHROSTOLITHOTOMY   . REMOVAL CYST LEFT LUNG  1994   AT [facility]   benign  . RHINOPLASTY  1970'S   nose fx  . TRANSTHORACIC ECHOCARDIOGRAM  09-22-2011  DR Anzleigh Slaven   EF 50%/  MILD LVH/ GRADE I DIASTOLIC DYSFUNCTION/ MILD TR    Social History    Socioeconomic History  . Marital status: Married    Spouse name: Not on file  . Number of children: 1  . Years of education: Not on file  . Highest education level: Not on file  Occupational History  . Occupation: FORK LIFT    Employer: [employer]  Tobacco Use  . Smoking status: Current Every Day Smoker    Packs/day: 0.00    Years: 32.00    Pack years: 0.00    Types: Cigars  . Smokeless tobacco: Never Used  Vaping Use  . Vaping Use: Never used  Substance and Sexual Activity  . Alcohol use: Yes    Alcohol/week: 21.0 standard drinks    Types: 21 Cans of beer per week    Comment: daily  . Drug use: No  . Sexual activity: Not Currently  Other Topics Concern  . Not on file  Social History Narrative   No regular exercise   Daily caffeine      Social Determinants of Health   Financial Resource Strain: Not on file  Food Insecurity: Not on file  Transportation Needs: Not on file  Physical Activity: Not on file  Stress: Not on file  Social Connections: Not on file  Intimate Partner Violence: Not on file    Family History  Problem Relation Age of Onset  . Diabetes Mother   . Hypertension Mother   . Diabetes Father   . Hypertension Father   . Colon cancer Paternal Grandfather     ROS: no fevers or chills, productive cough, hemoptysis, dysphasia, odynophagia, melena, hematochezia, dysuria, hematuria, rash, seizure activity, orthopnea, PND, pedal edema, claudication. Remaining systems are negative.  Physical Exam: Well-developed well-nourished in no acute distress.  Skin is warm and dry.  HEENT is normal.  Neck is supple.  Chest is clear to auscultation with normal expansion.  Cardiovascular exam is regular rate and rhythm.  Abdominal exam nontender or distended. No masses palpated. Extremities show no edema. neuro grossly intact  A/P  1 chest pain-previous symptoms felt likely musculoskeletal and have not recurred.  Will not  pursue further ischemia evaluation.  2 nonischemic cardiomyopathy-LV function mildly reduced on most recent echocardiogram.  Question hypertensive mediated.  Continue ARB and beta-blocker.  3 hypertension-blood pressure elevated.  Increase carvedilol to 12.5 mg twice daily.  We will follow and advance regimen as needed.  4 tobacco abuse-pt counseled on discontinuing.  Kirk Ruths, MD

## 2020-09-18 ENCOUNTER — Ambulatory Visit (INDEPENDENT_AMBULATORY_CARE_PROVIDER_SITE_OTHER): Payer: BC Managed Care – PPO | Admitting: Cardiology

## 2020-09-18 ENCOUNTER — Other Ambulatory Visit: Payer: Self-pay

## 2020-09-18 ENCOUNTER — Encounter: Payer: Self-pay | Admitting: Cardiology

## 2020-09-18 ENCOUNTER — Ambulatory Visit: Payer: BC Managed Care – PPO | Admitting: Cardiology

## 2020-09-18 VITALS — BP 162/100 | HR 78 | Ht 66.0 in | Wt 178.0 lb

## 2020-09-18 DIAGNOSIS — I5022 Chronic systolic (congestive) heart failure: Secondary | ICD-10-CM

## 2020-09-18 DIAGNOSIS — I428 Other cardiomyopathies: Secondary | ICD-10-CM

## 2020-09-18 DIAGNOSIS — I10 Essential (primary) hypertension: Secondary | ICD-10-CM

## 2020-09-18 MED ORDER — CARVEDILOL 12.5 MG PO TABS
12.5000 mg | ORAL_TABLET | Freq: Two times a day (BID) | ORAL | 3 refills | Status: DC
Start: 2020-09-18 — End: 2021-03-20

## 2020-09-18 NOTE — Patient Instructions (Signed)
Medication Instructions:   INCREASE CARVEDILOL TO 12.5 MG TWICE DAILY= 2 OF THE 6.25 MG TABLETS TWICE DAILY  *If you need a refill on your cardiac medications before your next appointment, please call your pharmacy*   Follow-Up: At [facility], you and your health needs are our priority.  As part of our continuing mission to provide you with exceptional heart care, we have created designated Provider Care Teams.  These Care Teams include your primary Cardiologist (physician) and Advanced Practice Providers (APPs -  Physician Assistants and Nurse Practitioners) who all work together to provide you with the care you need, when you need it.  We recommend signing up for the patient portal called "MyChart".  Sign up information is provided on this After Visit Summary.  MyChart is used to connect with patients for Virtual Visits (Telemedicine).  Patients are able to view lab/test results, encounter notes, upcoming appointments, etc.  Non-urgent messages can be sent to your provider as well.   To learn more about what you can do with MyChart, go to [email or web address].    Your next appointment:   6 month(s)  The format for your next appointment:   In Person  Provider:   Kirk Ruths, MD   Other Instructions BLOOD PRESSURE GOAL IS LESS THAN 130/85

## 2020-11-21 ENCOUNTER — Other Ambulatory Visit: Payer: Self-pay | Admitting: Internal Medicine

## 2020-11-21 DIAGNOSIS — I10 Essential (primary) hypertension: Secondary | ICD-10-CM

## 2021-03-20 ENCOUNTER — Telehealth: Payer: Self-pay | Admitting: Cardiology

## 2021-03-20 ENCOUNTER — Other Ambulatory Visit: Payer: Self-pay | Admitting: *Deleted

## 2021-03-20 DIAGNOSIS — I5022 Chronic systolic (congestive) heart failure: Secondary | ICD-10-CM

## 2021-03-20 DIAGNOSIS — E118 Type 2 diabetes mellitus with unspecified complications: Secondary | ICD-10-CM

## 2021-03-20 DIAGNOSIS — I10 Essential (primary) hypertension: Secondary | ICD-10-CM

## 2021-03-20 DIAGNOSIS — I428 Other cardiomyopathies: Secondary | ICD-10-CM

## 2021-03-20 DIAGNOSIS — E785 Hyperlipidemia, unspecified: Secondary | ICD-10-CM

## 2021-03-20 MED ORDER — AMLODIPINE BESYLATE 10 MG PO TABS: 10.0000 mg | ORAL_TABLET | Freq: Every day | ORAL | 1 refills | Status: DC

## 2021-03-20 MED ORDER — CARVEDILOL 12.5 MG PO TABS
12.5000 mg | ORAL_TABLET | Freq: Two times a day (BID) | ORAL | 1 refills | Status: DC
Start: 2021-03-20 — End: 2021-07-30

## 2021-03-20 MED ORDER — CANDESARTAN CILEXETIL 16 MG PO TABS: 16.0000 mg | ORAL_TABLET | Freq: Every day | ORAL | 1 refills | Status: DC

## 2021-03-20 MED ORDER — ATORVASTATIN CALCIUM 40 MG PO TABS: 40.0000 mg | ORAL_TABLET | Freq: Every day | ORAL | 1 refills | Status: AC

## 2021-03-20 MED ORDER — DAPAGLIFLOZIN PROPANEDIOL 10 MG PO TABS: 10.0000 mg | ORAL_TABLET | Freq: Every day | ORAL | 1 refills | Status: AC

## 2021-03-20 MED ORDER — CARVEDILOL 12.5 MG PO TABS
12.5000 mg | ORAL_TABLET | Freq: Two times a day (BID) | ORAL | 3 refills | Status: DC
Start: 2021-03-20 — End: 2021-03-20

## 2021-03-20 MED ORDER — ATORVASTATIN CALCIUM 40 MG PO TABS: 40.0000 mg | ORAL_TABLET | Freq: Every day | ORAL | 3 refills | Status: DC

## 2021-03-20 MED ORDER — DAPAGLIFLOZIN PROPANEDIOL 10 MG PO TABS: 10.0000 mg | ORAL_TABLET | Freq: Every day | ORAL | 3 refills | Status: DC

## 2021-03-20 MED ORDER — AMLODIPINE BESYLATE 10 MG PO TABS: 10.0000 mg | ORAL_TABLET | Freq: Every day | ORAL | 3 refills | Status: DC

## 2021-03-20 MED ORDER — CANDESARTAN CILEXETIL 16 MG PO TABS: 16.0000 mg | ORAL_TABLET | Freq: Every day | ORAL | 3 refills | Status: DC

## 2021-03-20 NOTE — Telephone Encounter (Signed)
Patient states his prescriptions were sent to the wrong pharmacy. He states they were sent to his wife's pharmacy. He states his pharmacy is [facility] on [address] in [place]. He says he does not remember the names, but there are 5.

## 2021-03-20 NOTE — Telephone Encounter (Signed)
Medications refilled to [facility]  Amlodipine Atorvastatin  Candesartan  Carvedilol  Wilder Glade

## 2021-03-20 NOTE — Telephone Encounter (Signed)
This encounter was created in error - please disregard.

## 2021-03-20 NOTE — Telephone Encounter (Signed)
Patient is currently here with his wife at his appointment and states he is needing a refill on his blood pressure medication. CB [phone]

## 2021-03-22 ENCOUNTER — Telehealth: Payer: Self-pay

## 2021-03-22 NOTE — Telephone Encounter (Signed)
**Note De-Identified Reyah Streeter Obfuscation** Wilder Glade PA started through covermymeds. Key: Nadara Eaton

## 2021-04-02 NOTE — Telephone Encounter (Signed)
**Note De-Identified [name] Obfuscation** Jiles Crocker Key: Nadara Eaton - PA Case ID: [id number] Outcome: N/A on November 4 This medication or product is on your plan's list of covered drugs. Prior authorization is not required at this time. I Drug: Farxiga 10MG  tablets Form: [employer] Electronic Prior Authorization Form [id number] [facility]  Mike Orozco is aware of this approval.

## 2021-04-19 ENCOUNTER — Other Ambulatory Visit (HOSPITAL_BASED_OUTPATIENT_CLINIC_OR_DEPARTMENT_OTHER): Payer: Self-pay

## 2021-04-19 MED ORDER — INFLUENZA VAC SPLIT QUAD 0.5 ML IM SUSY
PREFILLED_SYRINGE | INTRAMUSCULAR | 0 refills | Status: DC
  Filled 2021-04-19: qty 0.5, 1d supply, fill #0

## 2021-05-13 ENCOUNTER — Emergency Department (HOSPITAL_BASED_OUTPATIENT_CLINIC_OR_DEPARTMENT_OTHER)
Admission: EM | Admit: 2021-05-13 | Discharge: 2021-05-13 | Disposition: A | Discharge status: Home / Self Care | Payer: BC Managed Care – PPO | Attending: Emergency Medicine | Admitting: Emergency Medicine

## 2021-05-13 ENCOUNTER — Other Ambulatory Visit: Payer: Self-pay

## 2021-05-13 ENCOUNTER — Encounter (HOSPITAL_BASED_OUTPATIENT_CLINIC_OR_DEPARTMENT_OTHER): Payer: Self-pay | Admitting: Emergency Medicine

## 2021-05-13 DIAGNOSIS — Z7982 Long term (current) use of aspirin: Secondary | ICD-10-CM | POA: Insufficient documentation

## 2021-05-13 DIAGNOSIS — E1122 Type 2 diabetes mellitus with diabetic chronic kidney disease: Secondary | ICD-10-CM | POA: Insufficient documentation

## 2021-05-13 DIAGNOSIS — Z79899 Other long term (current) drug therapy: Secondary | ICD-10-CM | POA: Diagnosis not present

## 2021-05-13 DIAGNOSIS — N189 Chronic kidney disease, unspecified: Secondary | ICD-10-CM | POA: Diagnosis not present

## 2021-05-13 DIAGNOSIS — F17210 Nicotine dependence, cigarettes, uncomplicated: Secondary | ICD-10-CM | POA: Diagnosis not present

## 2021-05-13 DIAGNOSIS — I130 Hypertensive heart and chronic kidney disease with heart failure and stage 1 through stage 4 chronic kidney disease, or unspecified chronic kidney disease: Secondary | ICD-10-CM | POA: Diagnosis not present

## 2021-05-13 DIAGNOSIS — E039 Hypothyroidism, unspecified: Secondary | ICD-10-CM | POA: Diagnosis not present

## 2021-05-13 DIAGNOSIS — I5022 Chronic systolic (congestive) heart failure: Secondary | ICD-10-CM | POA: Diagnosis not present

## 2021-05-13 DIAGNOSIS — M542 Cervicalgia: Secondary | ICD-10-CM | POA: Diagnosis not present

## 2021-05-13 MED ORDER — TIZANIDINE HCL 4 MG PO TABS: 4.0000 mg | ORAL_TABLET | Freq: Four times a day (QID) | ORAL | 0 refills | Status: DC | PRN

## 2021-05-13 MED ORDER — OXYCODONE-ACETAMINOPHEN 5-325 MG PO TABS: 1.0000 | ORAL_TABLET | Freq: Four times a day (QID) | ORAL | 0 refills | Status: DC | PRN

## 2021-05-13 MED ORDER — OXYCODONE-ACETAMINOPHEN 5-325 MG PO TABS: 1.0000 | ORAL_TABLET | Freq: Four times a day (QID) | ORAL | 0 refills | Status: AC | PRN

## 2021-05-13 MED ORDER — TIZANIDINE HCL 2 MG PO TABS: 2.0000 mg | ORAL_TABLET | Freq: Once | ORAL | Status: DC

## 2021-05-13 MED ORDER — OXYCODONE-ACETAMINOPHEN 5-325 MG PO TABS
1.0000 | ORAL_TABLET | Freq: Once | ORAL | Status: AC
  Administered 2021-05-13: 09:00:00 1 via ORAL
  Filled 2021-05-13: qty 1

## 2021-05-13 MED ORDER — TIZANIDINE HCL 4 MG PO TABS: 4.0000 mg | ORAL_TABLET | Freq: Four times a day (QID) | ORAL | 0 refills | Status: AC | PRN

## 2021-05-13 MED ORDER — METHOCARBAMOL 500 MG PO TABS
500.0000 mg | ORAL_TABLET | Freq: Once | ORAL | Status: AC
  Administered 2021-05-13: 09:00:00 500 mg via ORAL
  Filled 2021-05-13: qty 1

## 2021-05-13 NOTE — ED Provider Notes (Addendum)
[facility] EMERGENCY DEPARTMENT Provider Note   CSN: [id number] Arrival date & time: 05/13/21  0820     History Chief Complaint  Patient presents with   Neck Pain    Mike Orozco is a 60 y.o. male.  Patient presents with chief complaint of neck pain.  Describes a sharp and aching posterior neck pain ongoing for 3 days.  Symptoms started after he lifted a case of water about 3 days ago as he was doing this he noticed sharp pain in the neck causing him to put a water down.  Since then has been having this persistent pain worse when he moves his head a certain way.  Otherwise denies any fall or other trauma.  No reports of fevers or cough or vomiting or diarrhea.  Denies shoulder pain or chest pain.      Past Medical History:  Diagnosis Date   Anemia    Arthritis    CHF (congestive heart failure) ([place])    Chronic renal insufficiency baseline CRE  1.5--1.8   NEPHROLOGIST--  DR WEBB ([facility])   Diabetes mellitus type 2, insulin dependent (HCC) PER pt and LAST PCP NOTE CBG'S IN 100'   RECENTLY UNCONTROLLED AIC 07-07-2012  9.4 AND CBG'S 400'S   GERD (gastroesophageal reflux disease)    Gout, unspecified    stable  per pt   Heart block AV first degree    Heart failure, chronic systolic (HCC)    History of ETOH abuse    Hypertension    CARDIOLOGIST-  DR Stanford Breed--- LOV IN [facility] 09-08-2011   Nephrolithiasis    RIGHT   Non-ischemic cardiomyopathy ([place]) SECONDARY TO HX UNCONTROLLED HTN AND ETOH ABUSE----  LAST ECHO EF 50%  09-22-2011   a) Echo 03/25/2010: EF 20-25%; mild AI; Mod MR; Mild LAE  b)cardiac cath 03/26/2010: normal cors; severe pulmonary HTN;PCWP 41; EF 10-15%;  c. echo 4/12:  EF 45-50%, mild LVH, grade 1 diast dysfxn, mild AI, mild LAE   Personal history of colonic polyps-adenoma 02/17/2012   01/2012 - diminutive adenoma Repeat colon about 01/2017     Polycystic kidney disease    BILATERAL RENAL CYST   Pure hyperglyceridemia     Patient Active Problem  List   Diagnosis Date Noted   Hypothyroidism (acquired) 06/02/2018   Thiamine deficiency 06/29/2017   Hyperlipidemia with target LDL less than 100 11/13/2015   Routine general medical examination at a health care facility 11/13/2015   Hypertriglyceridemia 03/22/2013   Type II diabetes mellitus with manifestations ([place]) 07/12/2012   Personal history of colonic polyps-adenoma 02/17/2012   Chronic renal insufficiency 10/27/2011   Gout 05/28/2011   Anemia of chronic disease 12/16/2010   Kidney stones 12/16/2010   Allergic rhinitis due to other allergen 06/17/2010   CARDIOMYOPATHY 16/96/7893   Chronic systolic heart failure ([place]) 04/03/2010   Essential hypertension, benign 04/02/2010    Past Surgical History:  Procedure Laterality Date   CARDIAC CATHETERIZATION  03-26-2010  DR Martinique   SEVERE LV DYSFUNCTION WITH MARKEDLY ELEVATED FILLING PRESSURE/ SEVERE PULMONARY HYPERTENSION/ NORMAL CORONARY ANATOMY   colonoscopy     COLONOSCOPY  2013   CYSTOSCOPY WITH RETROGRADE PYELOGRAM, URETEROSCOPY AND STENT PLACEMENT Right 08/18/2012   Procedure: CYSTOSCOPY WITH RETROGRADE PYELOGRAM, URETEROSCOPY AND STENT PLACEMENT;  Surgeon: Molli Hazard, MD;  Location: [facility];  Service: Urology;  Laterality: Right;   KIDNEY STONE SURGERY     X 2   KNEE ARTHROSCOPY W/ MENISCECTOMY Left 02-06-2011   AND CHONDROPLASTY  LUMBAR FUSION  08-10-2003   L5  --  S1   NEPHROLITHOTOMY Right 09/15/2012   Procedure: NEPHROLITHOTOMY PERCUTANEOUS;  Surgeon: Molli Hazard, MD;  Location: [facility];  Service: Urology;  Laterality: Right;  RIGHT PERCUTANEOUS NEPHROSTOLITHOTOMY    REMOVAL CYST LEFT LUNG  1994   AT [facility]   benign   RHINOPLASTY  1970'S   nose fx   TRANSTHORACIC ECHOCARDIOGRAM  09-22-2011  DR CRENSHAW   EF 50%/  MILD LVH/ GRADE I DIASTOLIC DYSFUNCTION/ MILD TR       Family History  Problem Relation Age of Onset   Diabetes Mother    Hypertension Mother    Diabetes Father     Hypertension Father    Colon cancer Paternal Grandfather     Social History   Tobacco Use   Smoking status: Every Day    Packs/day: 0.00    Years: 32.00    Pack years: 0.00    Types: Cigars, Cigarettes   Smokeless tobacco: Never  Vaping Use   Vaping Use: Never used  Substance Use Topics   Alcohol use: Yes    Alcohol/week: 21.0 standard drinks    Types: 21 Cans of beer per week    Comment: daily   Drug use: No    Home Medications Prior to Admission medications   Medication Sig Start Date End Date Taking? Authorizing Provider  oxyCODONE-acetaminophen (PERCOCET/ROXICET) 5-325 MG tablet Take 1 tablet by mouth every 6 (six) hours as needed for up to 10 doses for severe pain. 05/13/21  Yes Derya Dettmann, Greggory Brandy, MD  tiZANidine (ZANAFLEX) 4 MG tablet Take 1 tablet (4 mg total) by mouth every 6 (six) hours as needed for up to 20 doses for muscle spasms. 05/13/21  Yes Luna Fuse, MD  allopurinol (ZYLOPRIM) 100 MG tablet Take 1 tablet (100 mg total) by mouth daily. 03/26/20   Janith Lima, MD  amLODipine (NORVASC) 10 MG tablet Take 1 tablet (10 mg total) by mouth daily. 03/20/21   Lelon Perla, MD  aspirin EC 81 MG tablet Take 1 tablet (81 mg total) by mouth daily. 12/24/17   Janith Lima, MD  atorvastatin (LIPITOR) 40 MG tablet Take 1 tablet (40 mg total) by mouth daily. 03/20/21   Lelon Perla, MD  candesartan (ATACAND) 16 MG tablet Take 1 tablet (16 mg total) by mouth daily. 03/20/21   Lelon Perla, MD  carvedilol (COREG) 12.5 MG tablet Take 1 tablet (12.5 mg total) by mouth 2 (two) times daily with a meal. 03/20/21   Crenshaw, Denice Bors, MD  dapagliflozin propanediol (FARXIGA) 10 MG TABS tablet Take 1 tablet (10 mg total) by mouth daily before breakfast. 03/20/21   Stanford Breed, Denice Bors, MD  icosapent Ethyl (VASCEPA) 1 g capsule Take 2 capsules (2 g total) by mouth 2 (two) times daily. 03/26/20   Janith Lima, MD  levothyroxine (SYNTHROID) 50 MCG tablet Take 1 tablet (50 mcg  total) by mouth daily before breakfast. 03/26/20   Janith Lima, MD    Allergies    Lisinopril, Metformin, and Metformin and related  Review of Systems   Review of Systems  Constitutional:  Negative for fever.  HENT:  Negative for ear pain and sore throat.   Eyes:  Negative for pain.  Respiratory:  Negative for cough.   Cardiovascular:  Negative for chest pain.  Gastrointestinal:  Negative for abdominal pain.  Genitourinary:  Negative for flank pain.  Musculoskeletal:  Negative for back pain.  Skin:  Negative for color change and rash.  Neurological:  Negative for syncope.  All other systems reviewed and are negative.  Physical Exam Updated Vital Signs BP (!) 166/112 (BP Location: Right Arm)    Pulse 60    Temp 98.4 F (36.9 C) (Oral)    Resp 18    Ht 5\' 6"  (1.676 m)    Wt 68 kg    SpO2 100%    BMI 24.21 kg/m   Physical Exam Constitutional:      Appearance: He is well-developed.  HENT:     Head: Normocephalic.     Nose: Nose normal.  Eyes:     Extraocular Movements: Extraocular movements intact.  Cardiovascular:     Rate and Rhythm: Normal rate.  Pulmonary:     Effort: Pulmonary effort is normal.  Musculoskeletal:     Comments: Mild C4-5 and 6 paraspinal tenderness noted.  Normal range of motion of the C-spine albeit slow and with some pain.  Patient otherwise ambulatory with no focal neurodeficit noted.   Skin:    Coloration: Skin is not jaundiced.  Neurological:     General: No focal deficit present.     Mental Status: He is alert and oriented to person, place, and time. Mental status is at baseline.     Cranial Nerves: No cranial nerve deficit.     Motor: No weakness.     Comments: Bilateral upper extremities are 5/5 strength normal range of motion.  Distal radial pulses are 2+ bilaterally normal.    ED Results / Procedures / Treatments   Labs (all labs ordered are listed, but only abnormal results are displayed) Labs Reviewed - No data to  display  EKG None  Radiology No results found.  Procedures Procedures   Medications Ordered in ED Medications  oxyCODONE-acetaminophen (PERCOCET/ROXICET) 5-325 MG per tablet 1 tablet (has no administration in time range)  methocarbamol (ROBAXIN) tablet 500 mg (has no administration in time range)    ED Course  I have reviewed the triage vital signs and the nursing notes.  Pertinent labs & imaging results that were available during my care of the patient were reviewed by me and considered in my medical decision making (see chart for details).    MDM Rules/Calculators/A&P                         Patient appears to have strained his neck while lifting a case of bottled water.  I did consider imaging with mechanism and on exam it appears to be more muscular in nature, and I do not feel the benefits outweigh the risks.  No high-energy mechanism reported.  No focal neurodeficit reported.  Given pain medications here in the ER some with muscle relaxants.  Advised outpatient follow-up with his doctor within 3 to 4 days.  Advised immediate return for worsening symptoms or any additional concerns.     Final Clinical Impression(s) / ED Diagnoses Final diagnoses:  Neck pain    Rx / DC Orders ED Discharge Orders          Ordered    oxyCODONE-acetaminophen (PERCOCET/ROXICET) 5-325 MG tablet  Every 6 hours PRN        05/13/21 0906    tiZANidine (ZANAFLEX) 4 MG tablet  Every 6 hours PRN        05/13/21 0906             Luna Fuse, MD 05/13/21 [phone]  Luna Fuse, MD 05/13/21 [phone]

## 2021-05-13 NOTE — ED Triage Notes (Signed)
Reports pain in the neck and upper back for the last few days since lifting a case of water at work.  Taking ibuprofen with no relief.  Last dose 0300.

## 2021-05-13 NOTE — Discharge Instructions (Signed)
Call your primary care doctor or specialist as discussed in the next 2-3 days.   Return immediately back to the ER if:  Your symptoms worsen within the next 12-24 hours. You develop new symptoms such as new fevers, persistent vomiting, new pain, shortness of breath, or new weakness or numbness, or if you have any other concerns.  

## 2021-06-27 NOTE — Progress Notes (Signed)
HPI: FU nonischemic cardiomyopathy with EF initially at 15-20%. Cardiac catheterization in 03/2010 revealed normal coronary arteries. Most recent echocardiogram March 2022 showed ejection fraction 45 to 50%, mild left ventricular enlargement, mild left ventricular hypertrophy, grade 2 diastolic dysfunction, mild aortic insufficiency and mildly dilated aortic root.  Since last seen, there is no dyspnea, chest pain, palpitations or syncope.  Current Outpatient Medications  Medication Sig Dispense Refill   allopurinol (ZYLOPRIM) 100 MG tablet Take 1 tablet (100 mg total) by mouth daily. 90 tablet 1   amLODipine (NORVASC) 10 MG tablet Take 1 tablet (10 mg total) by mouth daily. 90 tablet 1   aspirin EC 81 MG tablet Take 1 tablet (81 mg total) by mouth daily. 30 tablet 0   atorvastatin (LIPITOR) 40 MG tablet Take 1 tablet (40 mg total) by mouth daily. 90 tablet 1   candesartan (ATACAND) 16 MG tablet Take 1 tablet (16 mg total) by mouth daily. 90 tablet 1   carvedilol (COREG) 12.5 MG tablet Take 1 tablet (12.5 mg total) by mouth 2 (two) times daily with a meal. 180 tablet 1   dapagliflozin propanediol (FARXIGA) 10 MG TABS tablet Take 1 tablet (10 mg total) by mouth daily before breakfast. 90 tablet 1   icosapent Ethyl (VASCEPA) 1 g capsule Take 2 capsules (2 g total) by mouth 2 (two) times daily. 360 capsule 1   levothyroxine (SYNTHROID) 50 MCG tablet Take 1 tablet (50 mcg total) by mouth daily before breakfast. 90 tablet 0   oxyCODONE-acetaminophen (PERCOCET/ROXICET) 5-325 MG tablet Take 1 tablet by mouth every 6 (six) hours as needed for up to 10 doses for severe pain. 10 tablet 0   tiZANidine (ZANAFLEX) 4 MG tablet Take 1 tablet (4 mg total) by mouth every 6 (six) hours as needed for up to 20 doses for muscle spasms. 20 tablet 0   No current facility-administered medications for this visit.     Past Medical History:  Diagnosis Date   Anemia    Arthritis    CHF (congestive heart  failure) ([place])    Chronic renal insufficiency baseline CRE  1.5--1.8   NEPHROLOGIST--  DR WEBB ([facility])   Diabetes mellitus type 2, insulin dependent (HCC) PER pt and LAST PCP NOTE CBG'S IN 100'   RECENTLY UNCONTROLLED AIC 07-07-2012  9.4 AND CBG'S 400'S   GERD (gastroesophageal reflux disease)    Gout, unspecified    stable  per pt   Heart block AV first degree    Heart failure, chronic systolic ([place])    History of ETOH abuse    Hypertension    CARDIOLOGIST-  DR Stanford Breed--- LOV IN EPIC 09-08-2011   Nephrolithiasis    RIGHT   Non-ischemic cardiomyopathy ([place]) SECONDARY TO HX UNCONTROLLED HTN AND ETOH ABUSE----  LAST ECHO EF 50%  09-22-2011   a) Echo 03/25/2010: EF 20-25%; mild AI; Mod MR; Mild LAE  b)cardiac cath 03/26/2010: normal cors; severe pulmonary HTN;PCWP 41; EF 10-15%;  c. echo 4/12:  EF 45-50%, mild LVH, grade 1 diast dysfxn, mild AI, mild LAE   Personal history of colonic polyps-adenoma 02/17/2012   01/2012 - diminutive adenoma Repeat colon about 01/2017     Polycystic kidney disease    BILATERAL RENAL CYST   Pure hyperglyceridemia     Past Surgical History:  Procedure Laterality Date   CARDIAC CATHETERIZATION  03-26-2010  DR Martinique   SEVERE LV DYSFUNCTION WITH MARKEDLY ELEVATED FILLING PRESSURE/ SEVERE PULMONARY HYPERTENSION/ NORMAL CORONARY ANATOMY  colonoscopy     COLONOSCOPY  2013   CYSTOSCOPY WITH RETROGRADE PYELOGRAM, URETEROSCOPY AND STENT PLACEMENT Right 08/18/2012   Procedure: CYSTOSCOPY WITH RETROGRADE PYELOGRAM, URETEROSCOPY AND STENT PLACEMENT;  Surgeon: Molli Hazard, MD;  Location: [facility];  Service: Urology;  Laterality: Right;   KIDNEY STONE SURGERY     X 2   KNEE ARTHROSCOPY W/ MENISCECTOMY Left 02-06-2011   AND CHONDROPLASTY   LUMBAR FUSION  08-10-2003   L5  --  S1   NEPHROLITHOTOMY Right 09/15/2012   Procedure: NEPHROLITHOTOMY PERCUTANEOUS;  Surgeon: Molli Hazard, MD;  Location: [facility];  Service: Urology;   Laterality: Right;  RIGHT PERCUTANEOUS NEPHROSTOLITHOTOMY    REMOVAL CYST LEFT LUNG  1994   AT [facility]   benign   RHINOPLASTY  1970'S   nose fx   TRANSTHORACIC ECHOCARDIOGRAM  09-22-2011  DR Adrieana Fennelly   EF 50%/  MILD LVH/ GRADE I DIASTOLIC DYSFUNCTION/ MILD TR    Social History   Socioeconomic History   Marital status: Married    Spouse name: Not on file   Number of children: 1   Years of education: Not on file   Highest education level: Not on file  Occupational History   Occupation: [employer]    Employer: [employer]: [place]  Tobacco Use   Smoking status: Every Day    Packs/day: 0.00    Years: 32.00    Pack years: 0.00    Types: Cigars, Cigarettes   Smokeless tobacco: Never  Vaping Use   Vaping Use: Never used  Substance and Sexual Activity   Alcohol use: Yes    Alcohol/week: 21.0 standard drinks    Types: 21 Cans of beer per week    Comment: daily   Drug use: No   Sexual activity: Not Currently  Other Topics Concern   Not on file  Social History Narrative   No regular exercise   Daily caffeine      Social Determinants of Health   Financial Resource Strain: Not on file  Food Insecurity: Not on file  Transportation Needs: Not on file  Physical Activity: Not on file  Stress: Not on file  Social Connections: Not on file  Intimate Partner Violence: Not on file    Family History  Problem Relation Age of Onset   Diabetes Mother    Hypertension Mother    Diabetes Father    Hypertension Father    Colon cancer Paternal Grandfather     ROS: Left knee arthralgias but no fevers or chills, productive cough, hemoptysis, dysphasia, odynophagia, melena, hematochezia, dysuria, hematuria, rash, seizure activity, orthopnea, PND, pedal edema, claudication. Remaining systems are negative.  Physical Exam: Well-developed well-nourished in no acute distress.  Skin is warm and dry.  HEENT is normal.  Neck is supple.  Chest is clear to auscultation  with normal expansion.  Cardiovascular exam is regular rate and rhythm.  Abdominal exam nontender or distended. No masses palpated. Extremities show no edema. neuro grossly intact  ECG-normal sinus rhythm at a rate of 61, no ST changes.  Personally reviewed  A/P  1 history of nonischemic cardiomyopathy-LV function mildly reduced on most recent echocardiogram.  We will repeat.  Continue ARB and beta-blocker.  2 hypertension-patient's blood pressure is elevated; add hydralazine 25 mg twice daily and advance as needed.  Check potassium and renal function.  3 tobacco abuse-patient counseled on discontinuing.  4 hyperlipidemia-continue statin.  Check lipids and liver.  Kirk Ruths, MD

## 2021-07-10 ENCOUNTER — Other Ambulatory Visit: Payer: Self-pay

## 2021-07-10 ENCOUNTER — Encounter: Payer: Self-pay | Admitting: Cardiology

## 2021-07-10 ENCOUNTER — Ambulatory Visit (INDEPENDENT_AMBULATORY_CARE_PROVIDER_SITE_OTHER): Payer: BC Managed Care – PPO | Admitting: Cardiology

## 2021-07-10 VITALS — BP 150/86 | HR 61 | Ht 66.0 in | Wt 169.1 lb

## 2021-07-10 DIAGNOSIS — E785 Hyperlipidemia, unspecified: Secondary | ICD-10-CM

## 2021-07-10 DIAGNOSIS — I10 Essential (primary) hypertension: Secondary | ICD-10-CM | POA: Diagnosis not present

## 2021-07-10 DIAGNOSIS — Z720 Tobacco use: Secondary | ICD-10-CM | POA: Diagnosis not present

## 2021-07-10 DIAGNOSIS — I428 Other cardiomyopathies: Secondary | ICD-10-CM

## 2021-07-10 MED ORDER — HYDRALAZINE HCL 25 MG PO TABS: 25.0000 mg | ORAL_TABLET | Freq: Two times a day (BID) | ORAL | 3 refills | Status: AC

## 2021-07-10 MED ORDER — HYDRALAZINE HCL 25 MG PO TABS: 25.0000 mg | ORAL_TABLET | Freq: Two times a day (BID) | ORAL | 3 refills | Status: DC

## 2021-07-10 NOTE — Patient Instructions (Signed)
Medication Instructions:   START HYDRALAZINE 25 MG ONE TABLET TWICE DAILY  *If you need a refill on your cardiac medications before your next appointment, please call your pharmacy*   Lab Work:  Your physician recommends that you return for lab work FASTING  If you have labs (blood work) drawn today and your tests are completely normal, you will receive your results only by: [place] (if you have MyChart) OR A paper copy in the mail If you have any lab test that is abnormal or we need to change your treatment, we will call you to review the results.   Testing/Procedures:  Your physician has requested that you have an echocardiogram. Echocardiography is a painless test that uses sound waves to create images of your heart. It provides your doctor with information about the size and shape of your heart and how well your hearts chambers and valves are working. This procedure takes approximately one hour. There are no restrictions for this procedure. [place] OFFICE-1ST FLOOR IMAGING DEPARTMENT-SCHEDULE IN MARCH   Follow-Up: At [facility], you and your health needs are our priority.  As part of our continuing mission to provide you with exceptional heart care, we have created designated Provider Care Teams.  These Care Teams include your primary Cardiologist (physician) and Advanced Practice Providers (APPs -  Physician Assistants and Nurse Practitioners) who all work together to provide you with the care you need, when you need it.  We recommend signing up for the patient portal called "MyChart".  Sign up information is provided on this After Visit Summary.  MyChart is used to connect with patients for Virtual Visits (Telemedicine).  Patients are able to view lab/test results, encounter notes, upcoming appointments, etc.  Non-urgent messages can be sent to your provider as well.   To learn more about what you can do with MyChart, go to [email or web address].    Your next  appointment:   12 month(s)  The format for your next appointment:   In Person  Provider:   Kirk Ruths, MD

## 2021-07-10 NOTE — Addendum Note (Signed)
Addended by: Cristopher Estimable on: 07/10/2021 10:10 AM   Modules accepted: Orders

## 2021-07-26 ENCOUNTER — Ambulatory Visit (HOSPITAL_BASED_OUTPATIENT_CLINIC_OR_DEPARTMENT_OTHER)
Admission: RE | Admit: 2021-07-26 | Discharge: 2021-07-26 | Disposition: A | Discharge status: Home / Self Care | Payer: BC Managed Care – PPO | Source: Ambulatory Visit | Attending: Cardiology | Admitting: Cardiology

## 2021-07-26 ENCOUNTER — Other Ambulatory Visit: Payer: Self-pay

## 2021-07-26 DIAGNOSIS — I428 Other cardiomyopathies: Secondary | ICD-10-CM | POA: Diagnosis not present

## 2021-07-26 DIAGNOSIS — E785 Hyperlipidemia, unspecified: Secondary | ICD-10-CM | POA: Diagnosis not present

## 2021-07-26 DIAGNOSIS — I10 Essential (primary) hypertension: Secondary | ICD-10-CM | POA: Diagnosis not present

## 2021-07-26 NOTE — Progress Notes (Signed)
?  Echocardiogram ?2D Echocardiogram has been performed. ? ?Mike Orozco ?07/26/2021, 5:13 PM ?

## 2021-07-27 LAB — COMPREHENSIVE METABOLIC PANEL
ALT: 27 IU/L (ref 0–44)
AST: 28 IU/L (ref 0–40)
Albumin/Globulin Ratio: 1.8 (ref 1.2–2.2)
Albumin: 4.7 g/dL (ref 3.8–4.8)
Alkaline Phosphatase: 84 IU/L (ref 44–121)
BUN/Creatinine Ratio: 20 (ref 10–24)
BUN: 20 mg/dL (ref 8–27)
Bilirubin Total: 0.6 mg/dL (ref 0.0–1.2)
CO2: 24 mmol/L (ref 20–29)
Calcium: 9.7 mg/dL (ref 8.6–10.2)
Chloride: 102 mmol/L (ref 96–106)
Creatinine, Ser: 1.01 mg/dL (ref 0.76–1.27)
Globulin, Total: 2.6 g/dL (ref 1.5–4.5)
Glucose: 90 mg/dL (ref 70–99)
Potassium: 3.9 mmol/L (ref 3.5–5.2)
Sodium: 138 mmol/L (ref 134–144)
Total Protein: 7.3 g/dL (ref 6.0–8.5)
eGFR: 85 mL/min/{1.73_m2} (ref >59)

## 2021-07-27 LAB — LIPID PANEL
Chol/HDL Ratio: 2.8 ratio (ref 0.0–5.0)
Cholesterol, Total: 168 mg/dL (ref 100–199)
HDL: 61 mg/dL (ref >39)
LDL Chol Calc (NIH): 92 mg/dL (ref 0–99)
Triglycerides: 83 mg/dL (ref 0–149)
VLDL Cholesterol Cal: 15 mg/dL (ref 5–40)

## 2021-07-27 LAB — SPECIMEN STATUS REPORT

## 2021-07-28 LAB — ECHOCARDIOGRAM COMPLETE
Area-P 1/2: 3.7 cm2
Calc EF: 40.1 %
P 1/2 time: 881 msec
S' Lateral: 4.3 cm
Single Plane A2C EF: 39.7 %
Single Plane A4C EF: 41.2 %

## 2021-07-30 ENCOUNTER — Telehealth: Payer: Self-pay | Admitting: *Deleted

## 2021-07-30 ENCOUNTER — Encounter: Payer: Self-pay | Admitting: *Deleted

## 2021-07-30 DIAGNOSIS — I10 Essential (primary) hypertension: Secondary | ICD-10-CM

## 2021-07-30 DIAGNOSIS — I5022 Chronic systolic (congestive) heart failure: Secondary | ICD-10-CM

## 2021-07-30 DIAGNOSIS — I428 Other cardiomyopathies: Secondary | ICD-10-CM

## 2021-07-30 MED ORDER — CARVEDILOL 12.5 MG PO TABS: 12.5000 mg | ORAL_TABLET | Freq: Two times a day (BID) | ORAL | 1 refills | Status: DC

## 2021-07-30 MED ORDER — SACUBITRIL-VALSARTAN 24-26 MG PO TABS: 1.0000 | ORAL_TABLET | Freq: Two times a day (BID) | ORAL | 11 refills | Status: DC

## 2021-07-30 MED ORDER — AMLODIPINE BESYLATE 10 MG PO TABS: 5.0000 mg | ORAL_TABLET | Freq: Every day | ORAL | 1 refills | Status: DC

## 2021-07-30 NOTE — Telephone Encounter (Signed)
Spoke with pt, aware of results and medication changes. ?He repeated the medication changes to me. ?New script sent to the pharmacy  ?Follow up scheduled  ?

## 2021-07-30 NOTE — Telephone Encounter (Signed)
-----   Message from Lelon Perla, MD sent at 07/29/2021  7:31 AM EDT ----- ?LV function decreased compared to previous; DC atacand, begin entresto 24/26 BID; decrease amlodipine to 5 mg daily and follow BP; will increase entresto and hopefully DC amlodipine over time; schedule elective fuov ?Kirk Ruths ? ?

## 2021-08-09 ENCOUNTER — Other Ambulatory Visit (HOSPITAL_BASED_OUTPATIENT_CLINIC_OR_DEPARTMENT_OTHER): Payer: Self-pay

## 2021-09-12 NOTE — Progress Notes (Signed)
? ? ? ? ?HPI:FU nonischemic cardiomyopathy with EF initially at 15-20%. Cardiac catheterization in 03/2010 revealed normal coronary arteries.  Follow-up echocardiogram March 2023 showed ejection fraction 35 to 96%, grade 1 diastolic dysfunction, mild left atrial enlargement, mild mitral regurgitation, mild aortic insufficiency, mildly dilated aortic root at 38 mm.  Since last seen, there is no dyspnea, chest pain, palpitations or syncope. ? ?Current Outpatient Medications  ?Medication Sig Dispense Refill  ? allopurinol (ZYLOPRIM) 100 MG tablet Take 1 tablet (100 mg total) by mouth daily. 90 tablet 1  ? amLODipine (NORVASC) 10 MG tablet Take 0.5 tablets (5 mg total) by mouth daily. 90 tablet 1  ? aspirin EC 81 MG tablet Take 1 tablet (81 mg total) by mouth daily. 30 tablet 0  ? atorvastatin (LIPITOR) 40 MG tablet Take 1 tablet (40 mg total) by mouth daily. 90 tablet 1  ? carvedilol (COREG) 12.5 MG tablet Take 1 tablet (12.5 mg total) by mouth 2 (two) times daily with a meal. 180 tablet 1  ? dapagliflozin propanediol (FARXIGA) 10 MG TABS tablet Take 1 tablet (10 mg total) by mouth daily before breakfast. 90 tablet 1  ? hydrALAZINE (APRESOLINE) 25 MG tablet Take 1 tablet (25 mg total) by mouth 2 (two) times daily. 180 tablet 3  ? icosapent Ethyl (VASCEPA) 1 g capsule Take 2 capsules (2 g total) by mouth 2 (two) times daily. 360 capsule 1  ? levothyroxine (SYNTHROID) 50 MCG tablet Take 1 tablet (50 mcg total) by mouth daily before breakfast. 90 tablet 0  ? oxyCODONE-acetaminophen (PERCOCET/ROXICET) 5-325 MG tablet Take 1 tablet by mouth every 6 (six) hours as needed for up to 10 doses for severe pain. 10 tablet 0  ? sacubitril-valsartan (ENTRESTO) 24-26 MG Take 1 tablet by mouth 2 (two) times daily. 60 tablet 11  ? tiZANidine (ZANAFLEX) 4 MG tablet Take 1 tablet (4 mg total) by mouth every 6 (six) hours as needed for up to 20 doses for muscle spasms. 20 tablet 0  ? ?No current facility-administered medications for this  visit.  ? ? ? ?Past Medical History:  ?Diagnosis Date  ? Anemia   ? Arthritis   ? CHF (congestive heart failure) ([place])   ? Chronic renal insufficiency baseline CRE  1.5--1.8  ? NEPHROLOGIST--  DR Justin Mend ([facility])  ? Diabetes mellitus type 2, insulin dependent (HCC) PER pt and LAST PCP NOTE CBG'S IN 100'  ? RECENTLY UNCONTROLLED AIC 07-07-2012  9.4 AND CBG'S 400'S  ? GERD (gastroesophageal reflux disease)   ? Gout, unspecified   ? stable  per pt  ? Heart block AV first degree   ? Heart failure, chronic systolic (HCC)   ? History of ETOH abuse   ? Hypertension   ? CARDIOLOGIST-  DR Stanford Breed--- LOV IN EPIC 09-08-2011  ? Nephrolithiasis   ? RIGHT  ? Non-ischemic cardiomyopathy ([place]) SECONDARY TO HX UNCONTROLLED HTN AND ETOH ABUSE----  LAST ECHO EF 50%  09-22-2011  ? a) Echo 03/25/2010: EF 20-25%; mild AI; Mod MR; Mild LAE  b)cardiac cath 03/26/2010: normal cors; severe pulmonary HTN;PCWP 41; EF 10-15%;  c. echo 4/12:  EF 45-50%, mild LVH, grade 1 diast dysfxn, mild AI, mild LAE  ? Personal history of colonic polyps-adenoma 02/17/2012  ? 01/2012 - diminutive adenoma Repeat colon about 01/2017    ? Polycystic kidney disease   ? BILATERAL RENAL CYST  ? Pure hyperglyceridemia   ? ? ?Past Surgical History:  ?Procedure Laterality Date  ? CARDIAC CATHETERIZATION  03-26-2010  DR Martinique  ? SEVERE LV DYSFUNCTION WITH MARKEDLY ELEVATED FILLING PRESSURE/ SEVERE PULMONARY HYPERTENSION/ NORMAL CORONARY ANATOMY  ? colonoscopy    ? COLONOSCOPY  2013  ? CYSTOSCOPY WITH RETROGRADE PYELOGRAM, URETEROSCOPY AND STENT PLACEMENT Right 08/18/2012  ? Procedure: CYSTOSCOPY WITH RETROGRADE PYELOGRAM, URETEROSCOPY AND STENT PLACEMENT;  Surgeon: Molli Hazard, MD;  [facility];  Service: Urology;  Laterality: Right;  ? KIDNEY STONE SURGERY    ? X 2  ? KNEE ARTHROSCOPY W/ MENISCECTOMY Left 02-06-2011  ? AND CHONDROPLASTY  ? LUMBAR FUSION  08-10-2003  ? L5  --  S1  ? NEPHROLITHOTOMY Right 09/15/2012  ? Procedure:  NEPHROLITHOTOMY PERCUTANEOUS;  Surgeon: Molli Hazard, MD;  Location: [facility];  Service: Urology;  Laterality: Right;  RIGHT PERCUTANEOUS NEPHROSTOLITHOTOMY ?  ? REMOVAL CYST LEFT LUNG  1994   AT [facility]  ? benign  ? RHINOPLASTY  1970'S  ? nose fx  ? TRANSTHORACIC ECHOCARDIOGRAM  09-22-2011  DR Tikita Mabee  ? EF 50%/  MILD LVH/ GRADE I DIASTOLIC DYSFUNCTION/ MILD TR  ? ? ?Social History  ? ?Socioeconomic History  ? Marital status: Married  ?  Spouse name: Not on file  ? Number of children: 1  ? Years of education: Not on file  ? Highest education level: Not on file  ?Occupational History  ? Occupation: FORK LIFT  ?  Employer: [employer]  ?  Comment: [employer]  ?Tobacco Use  ? Smoking status: Every Day  ?  Packs/day: 0.00  ?  Years: 32.00  ?  Pack years: 0.00  ?  Types: Cigars, Cigarettes  ? Smokeless tobacco: Never  ?Vaping Use  ? Vaping Use: Never used  ?Substance and Sexual Activity  ? Alcohol use: Yes  ?  Alcohol/week: 21.0 standard drinks  ?  Types: 21 Cans of beer per week  ?  Comment: daily  ? Drug use: No  ? Sexual activity: Not Currently  ?Other Topics Concern  ? Not on file  ?Social History Narrative  ? No regular exercise  ? Daily caffeine  ?   ? ?Social Determinants of Health  ? ?Financial Resource Strain: Not on file  ?Food Insecurity: Not on file  ?Transportation Needs: Not on file  ?Physical Activity: Not on file  ?Stress: Not on file  ?Social Connections: Not on file  ?Intimate Partner Violence: Not on file  ? ? ?Family History  ?Problem Relation Age of Onset  ? Diabetes Mother   ? Hypertension Mother   ? Diabetes Father   ? Hypertension Father   ? Colon cancer Paternal Grandfather   ? ? ?ROS: no fevers or chills, productive cough, hemoptysis, dysphasia, odynophagia, melena, hematochezia, dysuria, hematuria, rash, seizure activity, orthopnea, PND, pedal edema, claudication. Remaining systems are negative. ? ?Physical Exam: ?Well-developed well-nourished in no acute distress.  ?Skin is  warm and dry.  ?HEENT is normal.  ?Neck is supple.  ?Chest is clear to auscultation with normal expansion.  ?Cardiovascular exam is regular rate and rhythm.  ?Abdominal exam nontender or distended. No masses palpated. ?Extremities show no edema. ?neuro grossly intact ? ?A/P ? ?1 nonischemic cardiomyopathy-LV function mildly reduced compared to previous on most recent echocardiogram.  Continue carvedilol at present dose.  Discontinue amlodipine.  Add Entresto 24/26 twice daily (he had hives with lisinopril but no throat swelling and hopefully he will tolerate Entresto) and spironolactone 12.5 mg daily.  Check potassium and renal function 1 week.  He should also be  on Farxiga 10 mg daily.  He is unclear about his medications and he will touch base with [place] later today when he is able to review his medications at home.  Once medications are fully titrated we will plan to repeat echocardiogram.  If ejection fraction remains decreased we will plan cardiac CTA to rule out obstructive coronary disease.  I wonder whether his cardiomyopathy may be hypertensive mediated. ? ?2 hypertension-given cardiomyopathy we will discontinue amlodipine.  Medication adjustments as outlined above.  Follow blood pressure and we will advance medications as needed. ? ?3 tobacco abuse-patient again counseled on discontinuing. ? ?5 hyperlipidemia-continue statin. ? ?Kirk Ruths, MD ? ? ? ?

## 2021-09-25 ENCOUNTER — Encounter: Payer: Self-pay | Admitting: Cardiology

## 2021-09-25 ENCOUNTER — Ambulatory Visit (INDEPENDENT_AMBULATORY_CARE_PROVIDER_SITE_OTHER): Payer: BC Managed Care – PPO | Admitting: Cardiology

## 2021-09-25 VITALS — BP 152/84 | HR 60 | Ht 66.0 in | Wt 169.0 lb

## 2021-09-25 DIAGNOSIS — E785 Hyperlipidemia, unspecified: Secondary | ICD-10-CM

## 2021-09-25 DIAGNOSIS — I5022 Chronic systolic (congestive) heart failure: Secondary | ICD-10-CM

## 2021-09-25 DIAGNOSIS — I10 Essential (primary) hypertension: Secondary | ICD-10-CM | POA: Diagnosis not present

## 2021-09-25 DIAGNOSIS — I428 Other cardiomyopathies: Secondary | ICD-10-CM | POA: Diagnosis not present

## 2021-09-25 DIAGNOSIS — Z720 Tobacco use: Secondary | ICD-10-CM

## 2021-09-25 MED ORDER — SPIRONOLACTONE 25 MG PO TABS: 12.5000 mg | ORAL_TABLET | Freq: Every day | ORAL | 3 refills | Status: AC

## 2021-09-25 MED ORDER — SPIRONOLACTONE 25 MG PO TABS: 25.0000 mg | ORAL_TABLET | Freq: Every day | ORAL | 3 refills | Status: DC

## 2021-09-25 NOTE — Patient Instructions (Signed)
Medication Instructions:  ? ?STOP AMLODIPINE ? ?START SPIRONOLACTONE 12.5 MG ONCE DAILY=1/2 OF THE 25 MG TABLET ONCE DAILY ? ?*If you need a refill on your cardiac medications before your next appointment, please call your pharmacy* ? ? ?Lab Work: ? ?Your physician recommends that you return for lab work in: [place] ? ?If you have labs (blood work) drawn today and your tests are completely normal, you will receive your results only by: ?MyChart Message (if you have MyChart) OR ?A paper copy in the mail ?If you have any lab test that is abnormal or we need to change your treatment, we will call you to review the results. ? ? ?Follow-Up: ?At [facility], you and your health needs are our priority.  As part of our continuing mission to provide you with exceptional heart care, we have created designated Provider Care Teams.  These Care Teams include your primary Cardiologist (physician) and Advanced Practice Providers (APPs -  Physician Assistants and Nurse Practitioners) who all work together to provide you with the care you need, when you need it. ? ?We recommend signing up for the patient portal called "MyChart".  Sign up information is provided on this After Visit Summary.  MyChart is used to connect with patients for Virtual Visits (Telemedicine).  Patients are able to view lab/test results, encounter notes, upcoming appointments, etc.  Non-urgent messages can be sent to your provider as well.   ?To learn more about what you can do with MyChart, go to [email or web address].   ? ?Your next appointment:   ?6 week(s) ? ?The format for your next appointment:   ?In Person ? ?Provider:   ?Kirk Ruths, MD  ? ? ? ?Important Information About Sugar ? ? ? ? ?  ?

## 2021-10-02 ENCOUNTER — Encounter: Payer: Self-pay | Admitting: *Deleted

## 2021-11-05 NOTE — Progress Notes (Deleted)
HPI: FU nonischemic cardiomyopathy with EF initially at 15-20%. Cardiac catheterization in 03/2010 revealed normal coronary arteries.  Follow-up echocardiogram March 2023 showed ejection fraction 35 to 73%, grade 1 diastolic dysfunction, mild left atrial enlargement, mild mitral regurgitation, mild aortic insufficiency, mildly dilated aortic root at 38 mm.  Since last seen,   Current Outpatient Medications  Medication Sig Dispense Refill   allopurinol (ZYLOPRIM) 100 MG tablet Take 1 tablet (100 mg total) by mouth daily. 90 tablet 1   aspirin EC 81 MG tablet Take 1 tablet (81 mg total) by mouth daily. 30 tablet 0   atorvastatin (LIPITOR) 40 MG tablet Take 1 tablet (40 mg total) by mouth daily. 90 tablet 1   carvedilol (COREG) 12.5 MG tablet Take 1 tablet (12.5 mg total) by mouth 2 (two) times daily with a meal. 180 tablet 1   dapagliflozin propanediol (FARXIGA) 10 MG TABS tablet Take 1 tablet (10 mg total) by mouth daily before breakfast. 90 tablet 1   hydrALAZINE (APRESOLINE) 25 MG tablet Take 1 tablet (25 mg total) by mouth 2 (two) times daily. 180 tablet 3   icosapent Ethyl (VASCEPA) 1 g capsule Take 2 capsules (2 g total) by mouth 2 (two) times daily. 360 capsule 1   levothyroxine (SYNTHROID) 50 MCG tablet Take 1 tablet (50 mcg total) by mouth daily before breakfast. 90 tablet 0   oxyCODONE-acetaminophen (PERCOCET/ROXICET) 5-325 MG tablet Take 1 tablet by mouth every 6 (six) hours as needed for up to 10 doses for severe pain. 10 tablet 0   sacubitril-valsartan (ENTRESTO) 24-26 MG Take 1 tablet by mouth 2 (two) times daily. 60 tablet 11   spironolactone (ALDACTONE) 25 MG tablet Take 0.5 tablets (12.5 mg total) by mouth daily. 90 tablet 3   tiZANidine (ZANAFLEX) 4 MG tablet Take 1 tablet (4 mg total) by mouth every 6 (six) hours as needed for up to 20 doses for muscle spasms. 20 tablet 0   No current facility-administered medications for this visit.     Past Medical History:   Diagnosis Date   Anemia    Arthritis    CHF (congestive heart failure) ([place])    Chronic renal insufficiency baseline CRE  1.5--1.8   NEPHROLOGIST--  DR WEBB ([facility])   Diabetes mellitus type 2, insulin dependent (HCC) PER pt and LAST PCP NOTE CBG'S IN 100'   RECENTLY UNCONTROLLED AIC 07-07-2012  9.4 AND CBG'S 400'S   GERD (gastroesophageal reflux disease)    Gout, unspecified    stable  per pt   Heart block AV first degree    Heart failure, chronic systolic (HCC)    History of ETOH abuse    Hypertension    CARDIOLOGIST-  DR Stanford Breed--- LOV IN EPIC 09-08-2011   Nephrolithiasis    RIGHT   Non-ischemic cardiomyopathy ([place]) SECONDARY TO HX UNCONTROLLED HTN AND ETOH ABUSE----  LAST ECHO EF 50%  09-22-2011   a) Echo 03/25/2010: EF 20-25%; mild AI; Mod MR; Mild LAE  b)cardiac cath 03/26/2010: normal cors; severe pulmonary HTN;PCWP 41; EF 10-15%;  c. echo 4/12:  EF 45-50%, mild LVH, grade 1 diast dysfxn, mild AI, mild LAE   Personal history of colonic polyps-adenoma 02/17/2012   01/2012 - diminutive adenoma Repeat colon about 01/2017     Polycystic kidney disease    BILATERAL RENAL CYST   Pure hyperglyceridemia     Past Surgical History:  Procedure Laterality Date   CARDIAC CATHETERIZATION  03-26-2010  DR Martinique   SEVERE  LV DYSFUNCTION WITH MARKEDLY ELEVATED FILLING PRESSURE/ SEVERE PULMONARY HYPERTENSION/ NORMAL CORONARY ANATOMY   colonoscopy     COLONOSCOPY  2013   CYSTOSCOPY WITH RETROGRADE PYELOGRAM, URETEROSCOPY AND STENT PLACEMENT Right 08/18/2012   Procedure: CYSTOSCOPY WITH RETROGRADE PYELOGRAM, URETEROSCOPY AND STENT PLACEMENT;  Surgeon: Molli Hazard, MD;  Location: [facility];  Service: Urology;  Laterality: Right;   KIDNEY STONE SURGERY     X 2   KNEE ARTHROSCOPY W/ MENISCECTOMY Left 02-06-2011   AND CHONDROPLASTY   LUMBAR FUSION  08-10-2003   L5  --  S1   NEPHROLITHOTOMY Right 09/15/2012   Procedure: NEPHROLITHOTOMY PERCUTANEOUS;  Surgeon:  Molli Hazard, MD;  Location: [facility];  Service: Urology;  Laterality: Right;  RIGHT PERCUTANEOUS NEPHROSTOLITHOTOMY    REMOVAL CYST LEFT LUNG  1994   AT [facility]   benign   RHINOPLASTY  1970'S   nose fx   TRANSTHORACIC ECHOCARDIOGRAM  09-22-2011  DR Suan Pyeatt   EF 50%/  MILD LVH/ GRADE I DIASTOLIC DYSFUNCTION/ MILD TR    Social History   Socioeconomic History   Marital status: Married    Spouse name: Not on file   Number of children: 1   Years of education: Not on file   Highest education level: Not on file  Occupational History   Occupation: [employer]    Employer: [employer]  Tobacco Use   Smoking status: Every Day    Packs/day: 0.00    Years: 32.00    Total pack years: 0.00    Types: Cigars, Cigarettes   Smokeless tobacco: Never  Vaping Use   Vaping Use: Never used  Substance and Sexual Activity   Alcohol use: Yes    Alcohol/week: 21.0 standard drinks of alcohol    Types: 21 Cans of beer per week    Comment: daily   Drug use: No   Sexual activity: Not Currently  Other Topics Concern   Not on file  Social History Narrative   No regular exercise   Daily caffeine      Social Determinants of Health   Financial Resource Strain: Not on file  Food Insecurity: Not on file  Transportation Needs: Not on file  Physical Activity: Not on file  Stress: Not on file  Social Connections: Not on file  Intimate Partner Violence: Not on file    Family History  Problem Relation Age of Onset   Diabetes Mother    Hypertension Mother    Diabetes Father    Hypertension Father    Colon cancer Paternal Grandfather     ROS: no fevers or chills, productive cough, hemoptysis, dysphasia, odynophagia, melena, hematochezia, dysuria, hematuria, rash, seizure activity, orthopnea, PND, pedal edema, claudication. Remaining systems are negative.  Physical Exam: Well-developed well-nourished in no acute distress.  Skin is warm and dry.  HEENT is  normal.  Neck is supple.  Chest is clear to auscultation with normal expansion.  Cardiovascular exam is regular rate and rhythm.  Abdominal exam nontender or distended. No masses palpated. Extremities show no edema. neuro grossly intact  ECG- personally reviewed  A/P  1 nonischemic cardiomyopathy-continue carvedilol and Entresto.  Continue spironolactone.  We will also continue [place].  Plan repeat echocardiogram in 3 months.  If ejection fraction remains decreased we will plan cardiac CTA to rule out obstructive coronary disease.  His cardiomyopathy may be related to uncontrolled hypertension.  2 hyperlipidemia-continue statin.  3 hypertension-patient's blood pressure is controlled.  Continue  present medications.  4 tobacco abuse-patient counseled on discontinuing.  Kirk Ruths, MD

## 2021-11-07 ENCOUNTER — Encounter (HOSPITAL_BASED_OUTPATIENT_CLINIC_OR_DEPARTMENT_OTHER): Payer: Self-pay | Admitting: Pediatrics

## 2021-11-07 ENCOUNTER — Other Ambulatory Visit: Payer: Self-pay

## 2021-11-07 ENCOUNTER — Emergency Department (HOSPITAL_BASED_OUTPATIENT_CLINIC_OR_DEPARTMENT_OTHER): Payer: BC Managed Care – PPO

## 2021-11-07 ENCOUNTER — Emergency Department (HOSPITAL_BASED_OUTPATIENT_CLINIC_OR_DEPARTMENT_OTHER)
Admission: EM | Admit: 2021-11-07 | Discharge: 2021-11-07 | Disposition: A | Discharge status: Home / Self Care | Payer: BC Managed Care – PPO | Attending: Emergency Medicine | Admitting: Emergency Medicine

## 2021-11-07 DIAGNOSIS — S01512A Laceration without foreign body of oral cavity, initial encounter: Secondary | ICD-10-CM | POA: Insufficient documentation

## 2021-11-07 DIAGNOSIS — E119 Type 2 diabetes mellitus without complications: Secondary | ICD-10-CM | POA: Diagnosis not present

## 2021-11-07 DIAGNOSIS — S01511A Laceration without foreign body of lip, initial encounter: Secondary | ICD-10-CM | POA: Diagnosis not present

## 2021-11-07 DIAGNOSIS — M25512 Pain in left shoulder: Secondary | ICD-10-CM | POA: Diagnosis not present

## 2021-11-07 DIAGNOSIS — I110 Hypertensive heart disease with heart failure: Secondary | ICD-10-CM | POA: Insufficient documentation

## 2021-11-07 DIAGNOSIS — Z23 Encounter for immunization: Secondary | ICD-10-CM | POA: Insufficient documentation

## 2021-11-07 DIAGNOSIS — S0993XA Unspecified injury of face, initial encounter: Secondary | ICD-10-CM | POA: Diagnosis not present

## 2021-11-07 DIAGNOSIS — W01198A Fall on same level from slipping, tripping and stumbling with subsequent striking against other object, initial encounter: Secondary | ICD-10-CM | POA: Insufficient documentation

## 2021-11-07 DIAGNOSIS — Y9302 Activity, running: Secondary | ICD-10-CM | POA: Diagnosis not present

## 2021-11-07 DIAGNOSIS — S0990XA Unspecified injury of head, initial encounter: Secondary | ICD-10-CM | POA: Diagnosis not present

## 2021-11-07 DIAGNOSIS — I509 Heart failure, unspecified: Secondary | ICD-10-CM | POA: Diagnosis not present

## 2021-11-07 DIAGNOSIS — W19XXXA Unspecified fall, initial encounter: Secondary | ICD-10-CM

## 2021-11-07 MED ORDER — LIDOCAINE HCL (PF) 1 % IJ SOLN
30.0000 mL | Freq: Once | INTRAMUSCULAR | Status: AC
  Administered 2021-11-07: 30 mL
  Filled 2021-11-07: qty 30

## 2021-11-07 MED ORDER — OXYCODONE-ACETAMINOPHEN 5-325 MG PO TABS
1.0000 | ORAL_TABLET | Freq: Once | ORAL | Status: AC
  Administered 2021-11-07: 1 via ORAL
  Filled 2021-11-07: qty 1

## 2021-11-07 MED ORDER — TETANUS-DIPHTH-ACELL PERTUSSIS 5-2.5-18.5 LF-MCG/0.5 IM SUSY
0.5000 mL | PREFILLED_SYRINGE | Freq: Once | INTRAMUSCULAR | Status: AC
  Administered 2021-11-07: 0.5 mL via INTRAMUSCULAR
  Filled 2021-11-07: qty 0.5

## 2021-11-07 MED ORDER — LIDOCAINE VISCOUS HCL 2 % MT SOLN
15.0000 mL | Freq: Once | OROMUCOSAL | Status: AC
  Administered 2021-11-07: 15 mL via OROMUCOSAL
  Filled 2021-11-07: qty 15

## 2021-11-07 NOTE — Discharge Instructions (Addendum)
Please use Tylenol or ibuprofen for pain.  You may use 600 mg ibuprofen every 6 hours or 1000 mg of Tylenol every 6 hours.  You may choose to alternate between the 2.  This would be most effective.  Not to exceed 4 g of Tylenol within 24 hours.  Not to exceed 3200 mg ibuprofen 24 hours.  You can rinse your mouth 4 times daily with warm saline and mouthwash to help keep it clean.  If you have any concern for worsening swelling, sour taste in your mouth is possible that this wound is becoming infected and I recommend that you follow-up for further evaluation.  There is no fracture or dislocation of the shoulder and the pain should improve with time, rest.  You can wear the sling for the next few days to help keep it immobilized.

## 2021-11-07 NOTE — ED Triage Notes (Signed)
C/O left shoulder pain after a fall today; reported bleeding on gums after hitting chin against the chair; states teeth are intact; -LOC  Reported fell on Saturday as well; -LOC\; denies blood thinners.

## 2021-11-13 ENCOUNTER — Ambulatory Visit: Payer: BC Managed Care – PPO | Admitting: Cardiology

## 2022-01-29 NOTE — Progress Notes (Deleted)
HPI: FU nonischemic cardiomyopathy with EF initially at 15-20%. Cardiac catheterization in 03/2010 revealed normal coronary arteries.  Follow-up echocardiogram March 2023 showed ejection fraction 35 to 74%, grade 1 diastolic dysfunction, mild left atrial enlargement, mild mitral regurgitation, mild aortic insufficiency, mildly dilated aortic root at 38 mm.  Since last seen,   Current Outpatient Medications  Medication Sig Dispense Refill   allopurinol (ZYLOPRIM) 100 MG tablet Take 1 tablet (100 mg total) by mouth daily. 90 tablet 1   aspirin EC 81 MG tablet Take 1 tablet (81 mg total) by mouth daily. 30 tablet 0   atorvastatin (LIPITOR) 40 MG tablet Take 1 tablet (40 mg total) by mouth daily. 90 tablet 1   carvedilol (COREG) 12.5 MG tablet Take 1 tablet (12.5 mg total) by mouth 2 (two) times daily with a meal. 180 tablet 1   dapagliflozin propanediol (FARXIGA) 10 MG TABS tablet Take 1 tablet (10 mg total) by mouth daily before breakfast. 90 tablet 1   hydrALAZINE (APRESOLINE) 25 MG tablet Take 1 tablet (25 mg total) by mouth 2 (two) times daily. 180 tablet 3   icosapent Ethyl (VASCEPA) 1 g capsule Take 2 capsules (2 g total) by mouth 2 (two) times daily. 360 capsule 1   levothyroxine (SYNTHROID) 50 MCG tablet Take 1 tablet (50 mcg total) by mouth daily before breakfast. 90 tablet 0   oxyCODONE-acetaminophen (PERCOCET/ROXICET) 5-325 MG tablet Take 1 tablet by mouth every 6 (six) hours as needed for up to 10 doses for severe pain. 10 tablet 0   sacubitril-valsartan (ENTRESTO) 24-26 MG Take 1 tablet by mouth 2 (two) times daily. 60 tablet 11   spironolactone (ALDACTONE) 25 MG tablet Take 0.5 tablets (12.5 mg total) by mouth daily. 90 tablet 3   tiZANidine (ZANAFLEX) 4 MG tablet Take 1 tablet (4 mg total) by mouth every 6 (six) hours as needed for up to 20 doses for muscle spasms. 20 tablet 0   No current facility-administered medications for this visit.     Past Medical History:  Diagnosis  Date   Anemia    Arthritis    CHF (congestive heart failure) ([place])    Chronic renal insufficiency baseline CRE  1.5--1.8   NEPHROLOGIST--  DR WEBB ([facility])   Diabetes mellitus type 2, insulin dependent (HCC) PER pt and LAST PCP NOTE CBG'S IN 100'   RECENTLY UNCONTROLLED AIC 07-07-2012  9.4 AND CBG'S 400'S   GERD (gastroesophageal reflux disease)    Gout, unspecified    stable  per pt   Heart block AV first degree    Heart failure, chronic systolic (HCC)    History of ETOH abuse    Hypertension    CARDIOLOGIST-  DR Stanford Breed--- LOV IN EPIC 09-08-2011   Nephrolithiasis    RIGHT   Non-ischemic cardiomyopathy ([place]) SECONDARY TO HX UNCONTROLLED HTN AND ETOH ABUSE----  LAST ECHO EF 50%  09-22-2011   a) Echo 03/25/2010: EF 20-25%; mild AI; Mod MR; Mild LAE  b)cardiac cath 03/26/2010: normal cors; severe pulmonary HTN;PCWP 41; EF 10-15%;  c. echo 4/12:  EF 45-50%, mild LVH, grade 1 diast dysfxn, mild AI, mild LAE   Personal history of colonic polyps-adenoma 02/17/2012   01/2012 - diminutive adenoma Repeat colon about 01/2017     Polycystic kidney disease    BILATERAL RENAL CYST   Pure hyperglyceridemia     Past Surgical History:  Procedure Laterality Date   CARDIAC CATHETERIZATION  03-26-2010  DR Martinique   SEVERE LV  DYSFUNCTION WITH MARKEDLY ELEVATED FILLING PRESSURE/ SEVERE PULMONARY HYPERTENSION/ NORMAL CORONARY ANATOMY   colonoscopy     COLONOSCOPY  2013   CYSTOSCOPY WITH RETROGRADE PYELOGRAM, URETEROSCOPY AND STENT PLACEMENT Right 08/18/2012   Procedure: CYSTOSCOPY WITH RETROGRADE PYELOGRAM, URETEROSCOPY AND STENT PLACEMENT;  Surgeon: Molli Hazard, MD;  Location: [facility];  Service: Urology;  Laterality: Right;   KIDNEY STONE SURGERY     X 2   KNEE ARTHROSCOPY W/ MENISCECTOMY Left 02-06-2011   AND CHONDROPLASTY   LUMBAR FUSION  08-10-2003   L5  --  S1   NEPHROLITHOTOMY Right 09/15/2012   Procedure: NEPHROLITHOTOMY PERCUTANEOUS;  Surgeon: Molli Hazard, MD;  Location: [facility];  Service: Urology;  Laterality: Right;  RIGHT PERCUTANEOUS NEPHROSTOLITHOTOMY    REMOVAL CYST LEFT LUNG  1994   AT [facility]   benign   RHINOPLASTY  1970'S   nose fx   TRANSTHORACIC ECHOCARDIOGRAM  09-22-2011  DR Jedadiah Abdallah   EF 50%/  MILD LVH/ GRADE I DIASTOLIC DYSFUNCTION/ MILD TR    Social History   Socioeconomic History   Marital status: Married    Spouse name: Not on file   Number of children: 1   Years of education: Not on file   Highest education level: Not on file  Occupational History   Occupation: [employer]    Employer: [employer]  Tobacco Use   Smoking status: Every Day    Packs/day: 0.00    Years: 32.00    Total pack years: 0.00    Types: Cigars, Cigarettes   Smokeless tobacco: Never  Vaping Use   Vaping Use: Never used  Substance and Sexual Activity   Alcohol use: Yes    Alcohol/week: 21.0 standard drinks of alcohol    Types: 21 Cans of beer per week    Comment: daily   Drug use: No   Sexual activity: Not Currently  Other Topics Concern   Not on file  Social History Narrative   No regular exercise   Daily caffeine      Social Determinants of Health   Financial Resource Strain: Not on file  Food Insecurity: Not on file  Transportation Needs: Not on file  Physical Activity: Not on file  Stress: Not on file  Social Connections: Not on file  Intimate Partner Violence: Not on file    Family History  Problem Relation Age of Onset   Diabetes Mother    Hypertension Mother    Diabetes Father    Hypertension Father    Colon cancer Paternal Grandfather     ROS: no fevers or chills, productive cough, hemoptysis, dysphasia, odynophagia, melena, hematochezia, dysuria, hematuria, rash, seizure activity, orthopnea, PND, pedal edema, claudication. Remaining systems are negative.  Physical Exam: Well-developed well-nourished in no acute distress.  Skin is warm and dry.  HEENT is normal.  Neck  is supple.  Chest is clear to auscultation with normal expansion.  Cardiovascular exam is regular rate and rhythm.  Abdominal exam nontender or distended. No masses palpated. Extremities show no edema. neuro grossly intact  ECG- personally reviewed  A/P  1 nonischemic cardiomyopathy-plan to continue Entresto, carvedilol and spironolactone.  We will add Farxiga 10 mg daily.  Check potassium and renal function.  Once medications fully titrated we will repeat echocardiogram.  If LV function remains decreased we will arrange cardiac CTA to rule out obstructive coronary disease.  2 hypertension-  3 hyperlipidemia-continue statin.  4 tobacco abuse-patient counseled on  discontinuing.  Kirk Ruths, MD

## 2022-02-05 ENCOUNTER — Ambulatory Visit: Payer: BC Managed Care – PPO | Admitting: Cardiology

## 2022-03-19 NOTE — Progress Notes (Deleted)
Cardiology Office Note:    Date:  03/19/2022   ID:  Mike Orozco, DOB Oct 15, 1960, MRN [id number]  PCP:  Janith Lima, MD   [place] [facility] Cardiologist:  Kirk Ruths, MD { Click to update primary MD,subspecialty MD or APP then REFRESH:1}    Referring MD: Janith Lima, MD   No chief complaint on file. ***  History of Present Illness:    Mike Orozco is a 61 y.o. male with a hx of nonischemic cardiomyopathy with an EF as low as 15 to 20%.  Heart catheterization 03/2010 revealed normal coronaries.  Follow-up echocardiograms starting in 2012 showed improvement in his EF from 45 to 55%.  Echocardiogram 07/2020 showed LVEF 45-50% and grade 2 diastolic dysfunction.  Unfortunately last echocardiogram March 2023 showed a new reduction in EF to 35-40%, grade 1 DD, mild left atrial enlargement, mild MR, mild AI, mild dilated aortic root at 38 mm.  Given this, Dr. Stanford Breed titrated GDMT to include: Coreg 12.5 mg twice daily, hydralazine 25 mg 3 times daily, Entresto 24-26 mg twice daily, 12.5 mg spironolactone, Zetia, aspirin, Lipitor, Vascepa.  He has a history of hives on lisinopril, but no throat swelling or angiodema. Dr. Stanford Breed questions hypertension-mediated cardiomyopathy as there was some confusion regarding his current medications. It does not look like he had follow up labs after starting entresto and spiro.  He presents today for scheduled follow up.      NICM Chronic systolic and diastolic heart failure - possibly HTN medicated Hypertension Coreg, hydralazine, entresto, spiro, farxiga BMP today   Hyperlipidemia  Continue statin and vascepa     Past Medical History:  Diagnosis Date   Anemia    Arthritis    CHF (congestive heart failure) (HCC)    Chronic renal insufficiency baseline CRE  1.5--1.8   NEPHROLOGIST--  DR WEBB ([facility])   Diabetes mellitus type 2, insulin dependent (HCC) PER pt and LAST PCP NOTE CBG'S IN 100'   RECENTLY  UNCONTROLLED AIC 07-07-2012  9.4 AND CBG'S 400'S   GERD (gastroesophageal reflux disease)    Gout, unspecified    stable  per pt   Heart block AV first degree    Heart failure, chronic systolic ([place])    History of ETOH abuse    Hypertension    CARDIOLOGIST-  DR CRENSHAW--- LOV IN EPIC 09-08-2011   Nephrolithiasis    RIGHT   Non-ischemic cardiomyopathy ([place]) SECONDARY TO HX UNCONTROLLED HTN AND ETOH ABUSE----  LAST ECHO EF 50%  09-22-2011   a) Echo 03/25/2010: EF 20-25%; mild AI; Mod MR; Mild LAE  b)cardiac cath 03/26/2010: normal cors; severe pulmonary HTN;PCWP 41; EF 10-15%;  c. echo 4/12:  EF 45-50%, mild LVH, grade 1 diast dysfxn, mild AI, mild LAE   Personal history of colonic polyps-adenoma 02/17/2012   01/2012 - diminutive adenoma Repeat colon about 01/2017     Polycystic kidney disease    BILATERAL RENAL CYST   Pure hyperglyceridemia     Past Surgical History:  Procedure Laterality Date   CARDIAC CATHETERIZATION  03-26-2010  DR Martinique   SEVERE LV DYSFUNCTION WITH MARKEDLY ELEVATED FILLING PRESSURE/ SEVERE PULMONARY HYPERTENSION/ NORMAL CORONARY ANATOMY   colonoscopy     COLONOSCOPY  2013   CYSTOSCOPY WITH RETROGRADE PYELOGRAM, URETEROSCOPY AND STENT PLACEMENT Right 08/18/2012   Procedure: CYSTOSCOPY WITH RETROGRADE PYELOGRAM, URETEROSCOPY AND STENT PLACEMENT;  Surgeon: Molli Hazard, MD;  Location: [facility];  Service: Urology;  Laterality: Right;   KIDNEY STONE SURGERY  X 2   KNEE ARTHROSCOPY W/ MENISCECTOMY Left 02-06-2011   AND CHONDROPLASTY   LUMBAR FUSION  08-10-2003   L5  --  S1   NEPHROLITHOTOMY Right 09/15/2012   Procedure: NEPHROLITHOTOMY PERCUTANEOUS;  Surgeon: Molli Hazard, MD;  Location: [facility];  Service: Urology;  Laterality: Right;  RIGHT PERCUTANEOUS NEPHROSTOLITHOTOMY    REMOVAL CYST LEFT LUNG  1994   AT [facility]   benign   RHINOPLASTY  1970'S   nose fx   TRANSTHORACIC ECHOCARDIOGRAM  09-22-2011  DR CRENSHAW   EF 50%/  MILD  LVH/ GRADE I DIASTOLIC DYSFUNCTION/ MILD TR    Current Medications: No outpatient medications have been marked as taking for the 03/21/22 encounter (Appointment) with Ledora Bottcher, [place].     Allergies:   Lisinopril, Metformin, and Metformin and related   Social History   Socioeconomic History   Marital status: Married    Spouse name: Not on file   Number of children: 1   Years of education: Not on file   Highest education level: Not on file  Occupational History   Occupation: [employer]    Employer: [employer]  Tobacco Use   Smoking status: Every Day    Packs/day: 0.00    Years: 32.00    Total pack years: 0.00    Types: Cigars, Cigarettes   Smokeless tobacco: Never  Vaping Use   Vaping Use: Never used  Substance and Sexual Activity   Alcohol use: Yes    Alcohol/week: 21.0 standard drinks of alcohol    Types: 21 Cans of beer per week    Comment: daily   Drug use: No   Sexual activity: Not Currently  Other Topics Concern   Not on file  Social History Narrative   No regular exercise   Daily caffeine      Social Determinants of Health   Financial Resource Strain: Not on file  Food Insecurity: Not on file  Transportation Needs: Not on file  Physical Activity: Not on file  Stress: Not on file  Social Connections: Not on file     Family History: The patient's ***family history includes Colon cancer in his paternal grandfather; Diabetes in his father and mother; Hypertension in his father and mother.  ROS:   Please see the history of present illness.    *** All other systems reviewed and are negative.  EKGs/Labs/Other Studies Reviewed:    The following studies were reviewed today: ***  EKG:  EKG is *** ordered today.  The ekg ordered today demonstrates ***  Recent Labs: 07/26/2021: ALT 27; BUN 20; Creatinine, Ser 1.01; Potassium 3.9; Sodium 138  Recent Lipid Panel    Component Value Date/Time   CHOL 168 07/26/2021  0000   TRIG 83 07/26/2021 0000   HDL 61 07/26/2021 0000   CHOLHDL 2.8 07/26/2021 0000   CHOLHDL 4 03/26/2020 0908   VLDL 47.0 (H) 03/26/2020 0908   LDLCALC 92 07/26/2021 0000   LDLDIRECT 108.0 03/26/2020 0908     Risk Assessment/Calculations:   {Does this patient have ATRIAL FIBRILLATION?[phone]}  No BP recorded.  {Refresh Note OR Click here to enter BP  :1}***         Physical Exam:    VS:  There were no vitals taken for this visit.    Wt Readings from Last 3 Encounters:  11/07/21 160 lb (72.6 kg)  09/25/21 169 lb (76.7 kg)  07/10/21 169 lb 1.9 oz (76.7 kg)  GEN: *** Well nourished, well developed in no acute distress HEENT: Normal NECK: No JVD; No carotid bruits LYMPHATICS: No lymphadenopathy CARDIAC: ***RRR, no murmurs, rubs, gallops RESPIRATORY:  Clear to auscultation without rales, wheezing or rhonchi  ABDOMEN: Soft, non-tender, non-distended MUSCULOSKELETAL:  No edema; No deformity  SKIN: Warm and dry NEUROLOGIC:  Alert and oriented x 3 PSYCHIATRIC:  Normal affect   ASSESSMENT:    No diagnosis found. PLAN:    In order of problems listed above:  ***      {Are you ordering a CV Procedure (e.g. stress test, cath, DCCV, TEE, etc)?   Press F2        :[id number]}    Medication Adjustments/Labs and Tests Ordered: Current medicines are reviewed at length with the patient today.  Concerns regarding medicines are outlined above.  No orders of the defined types were placed in this encounter.  No orders of the defined types were placed in this encounter.   There are no Patient Instructions on file for this visit.   Signed, Ledora Bottcher, [place]  03/19/2022 7:48 PM    [facility]

## 2022-03-21 ENCOUNTER — Ambulatory Visit: Payer: BC Managed Care – PPO | Admitting: Physician Assistant

## 2022-04-16 ENCOUNTER — Telehealth: Payer: Self-pay | Admitting: Cardiology

## 2022-04-16 ENCOUNTER — Other Ambulatory Visit: Payer: Self-pay

## 2022-04-16 DIAGNOSIS — I5022 Chronic systolic (congestive) heart failure: Secondary | ICD-10-CM

## 2022-04-16 DIAGNOSIS — I10 Essential (primary) hypertension: Secondary | ICD-10-CM

## 2022-04-16 MED ORDER — SACUBITRIL-VALSARTAN 24-26 MG PO TABS: 1.0000 | ORAL_TABLET | Freq: Two times a day (BID) | ORAL | 11 refills | Status: DC

## 2022-04-16 MED ORDER — CARVEDILOL 12.5 MG PO TABS: 12.5000 mg | ORAL_TABLET | Freq: Two times a day (BID) | ORAL | 10 refills | Status: DC

## 2022-04-16 NOTE — Telephone Encounter (Signed)
*  STAT* If patient is at the pharmacy, call can be transferred to refill team.   1. Which medications need to be refilled? (please list name of each medication and dose if known)   carvedilol (COREG) 12.5 MG tablet    2. Which pharmacy/location (including street and city if local pharmacy) is medication to be sent to?  [facility] [address]    3. Do they need a 30 day or 90 day supply?  30  day  Patient calling the office for samples of medication:   1.  What medication and dosage are you requesting samples for?  sacubitril-valsartan (ENTRESTO) 24-26 MG    2.  Are you currently out of this medication? Yes

## 2022-04-16 NOTE — Telephone Encounter (Signed)
Called patient and explained that we did not have any samples of Entresto at the [place] office but I would check at the [facility] office on Friday when I was there. Patient stated that would be fine. I also informed him that I had re-filled his carvedilol. Patient was appreciative for the call and had no further questions at this time.

## 2022-04-27 NOTE — Progress Notes (Deleted)
Cardiology Office Note:    Date:  04/27/2022   ID:  Mike Orozco, DOB Feb 24, 1961, MRN [id number]  PCP:  Janith Lima, MD   [place] Providers Cardiologist:  Kirk Ruths, MD { Click to update primary MD,subspecialty MD or APP then REFRESH:1}    Referring MD: Janith Lima, MD   No chief complaint on file. ***  History of Present Illness:    Mike Orozco is a 61 y.o. male with a hx of ***Mike Orozco is a 61 y.o. male with a hx of nonischemic cardiomyopathy with an EF as low as 15 to 20%.  Heart catheterization 03/2010 revealed normal coronaries.  Follow-up echocardiograms starting in 2012 showed improvement in his EF from 45 to 55%.  Echocardiogram 07/2020 showed LVEF 45-50% and grade 2 diastolic dysfunction.  Unfortunately last echocardiogram March 2023 showed a new reduction in EF to 35-40%, grade 1 DD, mild left atrial enlargement, mild MR, mild AI, mild dilated aortic root at 38 mm.  Given this, Dr. Stanford Breed titrated GDMT to include: Coreg 12.5 mg twice daily, hydralazine 25 mg 3 times daily, Entresto 24-26 mg twice daily, 12.5 mg spironolactone, Zetia, aspirin, Lipitor, Vascepa.  He has a history of hives on lisinopril, but no throat swelling or angiodema. Dr. Stanford Breed questions hypertension-mediated cardiomyopathy as there was some confusion regarding his current medications. It does not look like he had follow up labs after starting entresto and spiro.  Of note, he had hives with lisinopril but no throat swelling.  He presents today for scheduled follow up.  He has canceled his last 4 cardiology appts since June 2023     NICM Chronic systolic and diastolic heart failure - possibly HTN mediated Hypertension Coreg, hydralazine, entresto, spiro, farxiga BMP today   Hyperlipidemia  Continue statin and vascepa   Past Medical History:  Diagnosis Date   Anemia    Arthritis    CHF (congestive heart failure) (HCC)    Chronic renal insufficiency baseline  CRE  1.5--1.8   NEPHROLOGIST--  DR WEBB ([facility])   Diabetes mellitus type 2, insulin dependent (HCC) PER pt and LAST PCP NOTE CBG'S IN 100'   RECENTLY UNCONTROLLED AIC 07-07-2012  9.4 AND CBG'S 400'S   GERD (gastroesophageal reflux disease)    Gout, unspecified    stable  per pt   Heart block AV first degree    Heart failure, chronic systolic ([place])    History of ETOH abuse    Hypertension    CARDIOLOGIST-  DR Stanford Breed--- LOV IN EPIC 09-08-2011   Nephrolithiasis    RIGHT   Non-ischemic cardiomyopathy ([place]) SECONDARY TO HX UNCONTROLLED HTN AND ETOH ABUSE----  LAST ECHO EF 50%  09-22-2011   a) Echo 03/25/2010: EF 20-25%; mild AI; Mod MR; Mild LAE  b)cardiac cath 03/26/2010: normal cors; severe pulmonary HTN;PCWP 41; EF 10-15%;  c. echo 4/12:  EF 45-50%, mild LVH, grade 1 diast dysfxn, mild AI, mild LAE   Personal history of colonic polyps-adenoma 02/17/2012   01/2012 - diminutive adenoma Repeat colon about 01/2017     Polycystic kidney disease    BILATERAL RENAL CYST   Pure hyperglyceridemia     Past Surgical History:  Procedure Laterality Date   CARDIAC CATHETERIZATION  03-26-2010  DR [place]   SEVERE LV DYSFUNCTION WITH MARKEDLY ELEVATED FILLING PRESSURE/ SEVERE PULMONARY HYPERTENSION/ NORMAL CORONARY ANATOMY   colonoscopy     COLONOSCOPY  2013   CYSTOSCOPY WITH RETROGRADE PYELOGRAM, URETEROSCOPY AND STENT PLACEMENT Right 08/18/2012  Procedure: CYSTOSCOPY WITH RETROGRADE PYELOGRAM, URETEROSCOPY AND STENT PLACEMENT;  Surgeon: Molli Hazard, MD;  Location: [facility];  Service: Urology;  Laterality: Right;   KIDNEY STONE SURGERY     X 2   KNEE ARTHROSCOPY W/ MENISCECTOMY Left 02-06-2011   AND CHONDROPLASTY   LUMBAR FUSION  08-10-2003   L5  --  S1   NEPHROLITHOTOMY Right 09/15/2012   Procedure: NEPHROLITHOTOMY PERCUTANEOUS;  Surgeon: Molli Hazard, MD;  Location: [facility];  Service: Urology;  Laterality: Right;  RIGHT PERCUTANEOUS NEPHROSTOLITHOTOMY     REMOVAL CYST LEFT LUNG  1994   AT [facility]   benign   RHINOPLASTY  1970'S   nose fx   TRANSTHORACIC ECHOCARDIOGRAM  09-22-2011  DR CRENSHAW   EF 50%/  MILD LVH/ GRADE I DIASTOLIC DYSFUNCTION/ MILD TR    Current Medications: No outpatient medications have been marked as taking for the 05/01/22 encounter (Appointment) with Ledora Bottcher, [place].     Allergies:   Lisinopril, Metformin, and Metformin and related   Social History   Socioeconomic History   Marital status: Married    Spouse name: Not on file   Number of children: 1   Years of education: Not on file   Highest education level: Not on file  Occupational History   Occupation: [employer]    Employer: [employer]  Tobacco Use   Smoking status: Every Day    Packs/day: 0.00    Years: 32.00    Total pack years: 0.00    Types: Cigars, Cigarettes   Smokeless tobacco: Never  Vaping Use   Vaping Use: Never used  Substance and Sexual Activity   Alcohol use: Yes    Alcohol/week: 21.0 standard drinks of alcohol    Types: 21 Cans of beer per week    Comment: daily   Drug use: No   Sexual activity: Not Currently  Other Topics Concern   Not on file  Social History Narrative   No regular exercise   Daily caffeine      Social Determinants of Health   Financial Resource Strain: Not on file  Food Insecurity: Not on file  Transportation Needs: Not on file  Physical Activity: Not on file  Stress: Not on file  Social Connections: Not on file     Family History: The patient's ***family history includes Colon cancer in his paternal grandfather; Diabetes in his father and mother; Hypertension in his father and mother.  ROS:   Please see the history of present illness.    *** All other systems reviewed and are negative.  EKGs/Labs/Other Studies Reviewed:    The following studies were reviewed today: ***  EKG:  EKG is *** ordered today.  The ekg ordered today demonstrates  ***  Recent Labs: 07/26/2021: ALT 27; BUN 20; Creatinine, Ser 1.01; Potassium 3.9; Sodium 138  Recent Lipid Panel    Component Value Date/Time   CHOL 168 07/26/2021 0000   TRIG 83 07/26/2021 0000   HDL 61 07/26/2021 0000   CHOLHDL 2.8 07/26/2021 0000   CHOLHDL 4 03/26/2020 0908   VLDL 47.0 (H) 03/26/2020 0908   LDLCALC 92 07/26/2021 0000   LDLDIRECT 108.0 03/26/2020 0908     Risk Assessment/Calculations:   {Does this patient have ATRIAL FIBRILLATION?[phone]}  No BP recorded.  {Refresh Note OR Click here to enter BP  :1}***         Physical Exam:    VS:  There were  no vitals taken for this visit.    Wt Readings from Last 3 Encounters:  11/07/21 160 lb (72.6 kg)  09/25/21 169 lb (76.7 kg)  07/10/21 169 lb 1.9 oz (76.7 kg)     GEN: *** Well nourished, well developed in no acute distress HEENT: Normal NECK: No JVD; No carotid bruits LYMPHATICS: No lymphadenopathy CARDIAC: ***RRR, no murmurs, rubs, gallops RESPIRATORY:  Clear to auscultation without rales, wheezing or rhonchi  ABDOMEN: Soft, non-tender, non-distended MUSCULOSKELETAL:  No edema; No deformity  SKIN: Warm and dry NEUROLOGIC:  Alert and oriented x 3 PSYCHIATRIC:  Normal affect   ASSESSMENT:    No diagnosis found. PLAN:    In order of problems listed above:  ***      {Are you ordering a CV Procedure (e.g. stress test, cath, DCCV, TEE, etc)?   Press F2        :[id number]}    Medication Adjustments/Labs and Tests Ordered: Current medicines are reviewed at length with the patient today.  Concerns regarding medicines are outlined above.  No orders of the defined types were placed in this encounter.  No orders of the defined types were placed in this encounter.   There are no Patient Instructions on file for this visit.   Signed, Ledora Bottcher, PA  04/27/2022 1:53 PM    [facility]

## 2022-05-01 ENCOUNTER — Ambulatory Visit: Payer: BC Managed Care – PPO | Admitting: Physician Assistant

## 2022-05-09 ENCOUNTER — Other Ambulatory Visit (HOSPITAL_COMMUNITY): Payer: Self-pay

## 2022-06-19 ENCOUNTER — Ambulatory Visit: Payer: BC Managed Care – PPO | Admitting: Cardiology

## 2022-07-15 ENCOUNTER — Ambulatory Visit: Payer: BC Managed Care – PPO | Admitting: Family Medicine

## 2022-07-17 ENCOUNTER — Ambulatory Visit: Payer: Self-pay

## 2022-07-17 ENCOUNTER — Encounter: Payer: Self-pay | Admitting: Family Medicine

## 2022-07-17 ENCOUNTER — Encounter: Payer: Self-pay | Admitting: Internal Medicine

## 2022-07-17 ENCOUNTER — Ambulatory Visit (INDEPENDENT_AMBULATORY_CARE_PROVIDER_SITE_OTHER): Payer: BC Managed Care – PPO | Admitting: Family Medicine

## 2022-07-17 VITALS — BP 160/92 | Ht 66.0 in | Wt 160.0 lb

## 2022-07-17 DIAGNOSIS — M1712 Unilateral primary osteoarthritis, left knee: Secondary | ICD-10-CM

## 2022-07-17 MED ORDER — TRIAMCINOLONE ACETONIDE 40 MG/ML IJ SUSP
40.0000 mg | Freq: Once | INTRAMUSCULAR | Status: AC
  Administered 2022-07-17: 40 mg via INTRA_ARTICULAR

## 2022-07-17 NOTE — Assessment & Plan Note (Signed)
Acute on chronic in nature.  Exacerbation of underlying degenerative changes.  Large effusion on exam today. -Counseled on home exercise therapy and supportive care. -Aspiration and injection. -Consider gel injection further imaging or physical therapy.

## 2022-07-17 NOTE — Progress Notes (Signed)
Kunta Nephew - 62 y.o. male MRN [id number]  Date of birth: [birth year]  SUBJECTIVE:  Including CC & ROS.  No chief complaint on file.   Moss Seurer is a 62 y.o. male that is presenting with acute on chronic left knee pain.  He has had similar pain in the past.  He did have a contusion to the anterior aspect of the knee.  No history of surgery.  Localized to the knee.    Review of Systems See HPI   HISTORY: Past Medical, Surgical, Social, and Family History Reviewed & Updated per EMR.   Pertinent Historical Findings include:  Past Medical History:  Diagnosis Date   Anemia    Arthritis    CHF (congestive heart failure) ([place])    Chronic renal insufficiency baseline CRE  1.5--1.8   NEPHROLOGIST--  DR WEBB ([facility])   Diabetes mellitus type 2, insulin dependent (HCC) PER pt and LAST PCP NOTE CBG'S IN 100'   RECENTLY UNCONTROLLED AIC 07-07-2012  9.4 AND CBG'S 400'S   GERD (gastroesophageal reflux disease)    Gout, unspecified    stable  per pt   Heart block AV first degree    Heart failure, chronic systolic ([place])    History of ETOH abuse    Hypertension    CARDIOLOGIST-  DR Stanford Breed--- LOV IN EPIC 09-08-2011   Nephrolithiasis    RIGHT   Non-ischemic cardiomyopathy ([place]) SECONDARY TO HX UNCONTROLLED HTN AND ETOH ABUSE----  LAST ECHO EF 50%  09-22-2011   a) Echo 03/25/2010: EF 20-25%; mild AI; Mod MR; Mild LAE  b)cardiac cath 03/26/2010: normal cors; severe pulmonary HTN;PCWP 41; EF 10-15%;  c. echo 4/12:  EF 45-50%, mild LVH, grade 1 diast dysfxn, mild AI, mild LAE   Personal history of colonic polyps-adenoma 02/17/2012   01/2012 - diminutive adenoma Repeat colon about 01/2017     Polycystic kidney disease    BILATERAL RENAL CYST   Pure hyperglyceridemia     Past Surgical History:  Procedure Laterality Date   CARDIAC CATHETERIZATION  03-26-2010  DR [place]   SEVERE LV DYSFUNCTION WITH MARKEDLY ELEVATED FILLING PRESSURE/ SEVERE PULMONARY HYPERTENSION/ NORMAL CORONARY ANATOMY    colonoscopy     COLONOSCOPY  2013   CYSTOSCOPY WITH RETROGRADE PYELOGRAM, URETEROSCOPY AND STENT PLACEMENT Right 08/18/2012   Procedure: CYSTOSCOPY WITH RETROGRADE PYELOGRAM, URETEROSCOPY AND STENT PLACEMENT;  Surgeon: Molli Hazard, MD;  Location[facility];  Service: Urology;  Laterality: Right;   KIDNEY STONE SURGERY     X 2   KNEE ARTHROSCOPY W/ MENISCECTOMY Left 02-06-2011   AND CHONDROPLASTY   LUMBAR FUSION  08-10-2003   L5  --  S1   NEPHROLITHOTOMY Right 09/15/2012   Procedure: NEPHROLITHOTOMY PERCUTANEOUS;  Surgeon: Molli Hazard, MD;  Location: [facility];  Service: Urology;  Laterality: Right;  RIGHT PERCUTANEOUS NEPHROSTOLITHOTOMY    REMOVAL CYST LEFT LUNG  1994   AT [facility]   benign   RHINOPLASTY  1970'S   nose fx   TRANSTHORACIC ECHOCARDIOGRAM  09-22-2011  DR CRENSHAW   EF 50%/  MILD LVH/ GRADE I DIASTOLIC DYSFUNCTION/ MILD TR     PHYSICAL EXAM:  VS: BP (!) 160/92 (BP Location: Left Arm, Patient Position: Sitting)   Ht '5\' 6"'$  (1.676 m)   Wt 160 lb (72.6 kg)   BMI 25.82 kg/m  Physical Exam Gen: NAD, alert, cooperative with exam, well-appearing MSK:  Neurovascularly intact     Aspiration/Injection Procedure Note Umut Kizzire 08-28-1960  Procedure: Injection Indications:  Left knee pain  Procedure Details Consent: Risks of procedure as well as the alternatives and risks of each were explained to the (patient/caregiver).  Consent for procedure obtained. Time Out: Verified patient identification, verified procedure, site/side was marked, verified correct patient position, special equipment/implants available, medications/allergies/relevent history reviewed, required imaging and test results available.  Performed.  The area was cleaned with iodine and alcohol swabs.    The left knee superior lateral suprapatellar pouch was injected using 4 cc of 1% lidocaine and 0.4 cc of 8.4% sodium bicarbonate on a 22-gauge 1-1/2 inch needle.  An 18-gauge  1-1/2 inch needle was used to achieve aspiration.  The syringe was switched to mixture containing 1 cc's of 40 mg Kenalog and 4 cc's of 0.25% bupivacaine was injected.  Ultrasound was used. Images were obtained in long views showing the injection.    Amount of Fluid Aspirated:  16m Character of Fluid: clear and straw colored Fluid was sent for: n/a  A sterile dressing was applied.  Patient did tolerate procedure well.     ASSESSMENT & PLAN:   Primary osteoarthritis of left knee Acute on chronic in nature.  Exacerbation of underlying degenerative changes.  Large effusion on exam today. -Counseled on home exercise therapy and supportive care. -Aspiration and injection. -Consider gel injection further imaging or physical therapy.

## 2022-07-17 NOTE — Patient Instructions (Signed)
Good to see you Please use ice as needed  You can consider compression  Please try the exercises   Please send me a message in MyChart with any questions or updates.  Please see me back in 4 weeks or as needed if better.   --Dr. Raeford Razor

## 2022-08-22 ENCOUNTER — Ambulatory Visit: Payer: BC Managed Care – PPO | Admitting: Family Medicine

## 2022-09-01 ENCOUNTER — Encounter: Payer: Self-pay | Admitting: *Deleted

## 2022-09-22 NOTE — Progress Notes (Signed)
HPI: FU nonischemic cardiomyopathy with EF initially at 15-20%. Cardiac catheterization in 03/2010 revealed normal coronary arteries.  Follow-up echocardiogram March 2023 showed ejection fraction 35 to 40%, grade 1 diastolic dysfunction, mild left atrial enlargement, mild mitral regurgitation, mild aortic insufficiency, mildly dilated aortic root at 38 mm.  Since last seen patient denies dyspnea, chest pain, palpitations or syncope.  Current Outpatient Medications  Medication Sig Dispense Refill   allopurinol (ZYLOPRIM) 100 MG tablet Take 1 tablet (100 mg total) by mouth daily. 90 tablet 1   aspirin EC 81 MG tablet Take 1 tablet (81 mg total) by mouth daily. 30 tablet 0   atorvastatin (LIPITOR) 40 MG tablet Take 1 tablet (40 mg total) by mouth daily. 90 tablet 1   carvedilol (COREG) 12.5 MG tablet Take 1 tablet (12.5 mg total) by mouth 2 (two) times daily with a meal. 60 tablet 10   dapagliflozin propanediol (FARXIGA) 10 MG TABS tablet Take 1 tablet (10 mg total) by mouth daily before breakfast. 90 tablet 1   icosapent Ethyl (VASCEPA) 1 g capsule Take 2 capsules (2 g total) by mouth 2 (two) times daily. 360 capsule 1   levothyroxine (SYNTHROID) 50 MCG tablet Take 1 tablet (50 mcg total) by mouth daily before breakfast. 90 tablet 0   oxyCODONE-acetaminophen (PERCOCET/ROXICET) 5-325 MG tablet Take 1 tablet by mouth every 6 (six) hours as needed for up to 10 doses for severe pain. 10 tablet 0   sacubitril-valsartan (ENTRESTO) 24-26 MG Take 1 tablet by mouth 2 (two) times daily. 60 tablet 11   spironolactone (ALDACTONE) 25 MG tablet Take 0.5 tablets (12.5 mg total) by mouth daily. 90 tablet 3   tiZANidine (ZANAFLEX) 4 MG tablet Take 1 tablet (4 mg total) by mouth every 6 (six) hours as needed for up to 20 doses for muscle spasms. 20 tablet 0   hydrALAZINE (APRESOLINE) 25 MG tablet Take 1 tablet (25 mg total) by mouth 2 (two) times daily. 180 tablet 3   No current facility-administered  medications for this visit.     Past Medical History:  Diagnosis Date   Anemia    Arthritis    CHF (congestive heart failure) (HCC)    Chronic renal insufficiency baseline CRE  1.5--1.8   NEPHROLOGIST--  DR WEBB ([facility])   Diabetes mellitus type 2, insulin dependent (HCC) PER pt and LAST PCP NOTE CBG'S IN 100'   RECENTLY UNCONTROLLED AIC 07-07-2012  9.4 AND CBG'S 400'S   GERD (gastroesophageal reflux disease)    Gout, unspecified    stable  per pt   Heart block AV first degree    Heart failure, chronic systolic (HCC)    History of ETOH abuse    Hypertension    CARDIOLOGIST-  DR Jens Som--- LOV IN EPIC 09-08-2011   Nephrolithiasis    RIGHT   Non-ischemic cardiomyopathy (HCC) SECONDARY TO HX UNCONTROLLED HTN AND ETOH ABUSE----  LAST ECHO EF 50%  09-22-2011   a) Echo 03/25/2010: EF 20-25%; mild AI; Mod MR; Mild LAE  b)cardiac cath 03/26/2010: normal cors; severe pulmonary HTN;PCWP 41; EF 10-15%;  c. echo 4/12:  EF 45-50%, mild LVH, grade 1 diast dysfxn, mild AI, mild LAE   Personal history of colonic polyps-adenoma 02/17/2012   01/2012 - diminutive adenoma Repeat colon about 01/2017     Polycystic kidney disease    BILATERAL RENAL CYST   Pure hyperglyceridemia     Past Surgical History:  Procedure Laterality Date   CARDIAC CATHETERIZATION  03-26-2010  DR [place]   SEVERE LV DYSFUNCTION WITH MARKEDLY ELEVATED FILLING PRESSURE/ SEVERE PULMONARY HYPERTENSION/ NORMAL CORONARY ANATOMY   colonoscopy     COLONOSCOPY  2013   CYSTOSCOPY WITH RETROGRADE PYELOGRAM, URETEROSCOPY AND STENT PLACEMENT Right 08/18/2012   Procedure: CYSTOSCOPY WITH RETROGRADE PYELOGRAM, URETEROSCOPY AND STENT PLACEMENT;  Surgeon: Milford Cage, MD;  Location: [facility];  Service: Urology;  Laterality: Right;   KIDNEY STONE SURGERY     X 2   KNEE ARTHROSCOPY W/ MENISCECTOMY Left 02-06-2011   AND CHONDROPLASTY   LUMBAR FUSION  08-10-2003   L5  --  S1   NEPHROLITHOTOMY Right 09/15/2012    Procedure: NEPHROLITHOTOMY PERCUTANEOUS;  Surgeon: Milford Cage, MD;  Location[facility];  Service: Urology;  Laterality: Right;  RIGHT PERCUTANEOUS NEPHROSTOLITHOTOMY    REMOVAL CYST LEFT LUNG  1994   AT [facility]   benign   RHINOPLASTY  1970'S   nose fx   TRANSTHORACIC ECHOCARDIOGRAM  09-22-2011  DR Lonie Newsham   EF 50%/  MILD LVH/ GRADE I DIASTOLIC DYSFUNCTION/ MILD TR    Social History   Socioeconomic History   Marital status: Married    Spouse name: Not on file   Number of children: 1   Years of education: Not on file   Highest education level: Not on file  Occupational History   Occupation: [employer]    Employer: [employer]    Comment: [employer]  Tobacco Use   Smoking status: Every Day    Packs/day: 0.00    Years: 32.00    Additional pack years: 0.00    Total pack years: 0.00    Types: Cigars, Cigarettes   Smokeless tobacco: Never  Vaping Use   Vaping Use: Never used  Substance and Sexual Activity   Alcohol use: Yes    Alcohol/week: 21.0 standard drinks of alcohol    Types: 21 Cans of beer per week    Comment: daily   Drug use: No   Sexual activity: Not Currently  Other Topics Concern   Not on file  Social History Narrative   No regular exercise   Daily caffeine      Social Determinants of Health   Financial Resource Strain: Not on file  Food Insecurity: Not on file  Transportation Needs: Not on file  Physical Activity: Not on file  Stress: Not on file  Social Connections: Not on file  Intimate Partner Violence: Not on file    Family History  Problem Relation Age of Onset   Diabetes Mother    Hypertension Mother    Diabetes Father    Hypertension Father    Colon cancer Paternal Grandfather     ROS: no fevers or chills, productive cough, hemoptysis, dysphasia, odynophagia, melena, hematochezia, dysuria, hematuria, rash, seizure activity, orthopnea, PND, pedal edema, claudication. Remaining systems are negative.  Physical  Exam: Well-developed well-nourished in no acute distress.  Skin is warm and dry.  HEENT is normal.  Neck is supple.  Chest is clear to auscultation with normal expansion.  Cardiovascular exam is regular rate and rhythm.  Abdominal exam nontender or distended. No masses palpated. Extremities show no edema. neuro grossly intact  ECG-sinus bradycardia at a rate of 52, left ventricular hypertrophy.  Personally reviewed  A/P  1 nonischemic cardiomyopathy-Continue Entresto (given elevated blood pressure will increase to 49/51 twice daily) and carvedilol.  Continue spironolactone and Farxiga.  Will plan to repeat echocardiogram for LV function in 6 months once hypertension is controlled.  I  think his cardiomyopathy is likely hypertensive mediated.  If it remains decreased we will arrange cardiac CTA to rule out obstructive coronary disease.  2 hypertension-blood pressure is elevated today and running high at home.  Will increase Entresto to 49/51 twice daily.  Check potassium and renal function in 1 week.  Increase medications as needed.  3 hyperlipidemia-continue statin.  Check lipids and liver.  4 tobacco abuse-patient again counseled on discontinuing.  Olga Millers, MD

## 2022-10-01 ENCOUNTER — Ambulatory Visit: Payer: BC Managed Care – PPO | Attending: Cardiology | Admitting: Cardiology

## 2022-10-01 ENCOUNTER — Encounter: Payer: Self-pay | Admitting: Cardiology

## 2022-10-01 VITALS — BP 180/100 | HR 52 | Ht 66.0 in | Wt 163.0 lb

## 2022-10-01 DIAGNOSIS — I428 Other cardiomyopathies: Secondary | ICD-10-CM

## 2022-10-01 DIAGNOSIS — E785 Hyperlipidemia, unspecified: Secondary | ICD-10-CM | POA: Diagnosis not present

## 2022-10-01 DIAGNOSIS — I5022 Chronic systolic (congestive) heart failure: Secondary | ICD-10-CM

## 2022-10-01 DIAGNOSIS — Z720 Tobacco use: Secondary | ICD-10-CM

## 2022-10-01 DIAGNOSIS — I10 Essential (primary) hypertension: Secondary | ICD-10-CM | POA: Diagnosis not present

## 2022-10-01 MED ORDER — SACUBITRIL-VALSARTAN 49-51 MG PO TABS: 1.0000 | ORAL_TABLET | Freq: Two times a day (BID) | ORAL | 11 refills | Status: DC

## 2022-10-01 MED ORDER — CARVEDILOL 12.5 MG PO TABS: 12.5000 mg | ORAL_TABLET | Freq: Two times a day (BID) | ORAL | 12 refills | Status: AC

## 2022-10-01 NOTE — Patient Instructions (Signed)
Medication Instructions:   INCREASE ENTRESTO TO 49/51 MG ONE TABLET TWICE DAILY= 2 OF THE 24/26 MG TABLETS TWICE DAILY  *If you need a refill on your cardiac medications before your next appointment, please call your pharmacy*   Lab Work:  Your physician recommends that you return for lab work in: ONE WEEK-DO NOT NEED TO FAST  [employer] [facility]  Located on the 3 rd floor in [address] Hours-Monday - Friday 8 am-11:30 am and 1 pm -4 pm   If you have labs (blood work) drawn today and your tests are completely normal, you will receive your results only by: MyChart Message (if you have MyChart) OR A paper copy in the mail If you have any lab test that is abnormal or we need to change your treatment, we will call you to review the results.   Follow-Up: At [facility], you and your health needs are our priority.  As part of our continuing mission to provide you with exceptional heart care, we have created designated Provider Care Teams.  These Care Teams include your primary Cardiologist (physician) and Advanced Practice Providers (APPs -  Physician Assistants and Nurse Practitioners) who all work together to provide you with the care you need, when you need it.  We recommend signing up for the patient portal called "MyChart".  Sign up information is provided on this After Visit Summary.  MyChart is used to connect with patients for Virtual Visits (Telemedicine).  Patients are able to view lab/test results, encounter notes, upcoming appointments, etc.  Non-urgent messages can be sent to your provider as well.   To learn more about what you can do with MyChart, go to [email or web address].    Your next appointment:   6 month(s)  Provider:   Olga Millers, MD

## 2022-10-01 NOTE — Addendum Note (Signed)
Addended by: Freddi Starr on: 10/01/2022 11:59 AM   Modules accepted: Orders

## 2022-10-16 NOTE — Addendum Note (Signed)
Addended by: Orlene Och on: 10/16/2022 03:45 PM   Modules accepted: Orders

## 2022-10-17 ENCOUNTER — Encounter: Payer: Self-pay | Admitting: *Deleted

## 2023-02-11 ENCOUNTER — Other Ambulatory Visit (HOSPITAL_COMMUNITY): Payer: Self-pay

## 2023-03-11 ENCOUNTER — Telehealth: Payer: Self-pay | Admitting: Cardiology

## 2023-03-11 NOTE — Telephone Encounter (Signed)
Patient calling the office for samples of medication:   1.  What medication and dosage are you requesting samples for?   carvedilol (COREG) 12.5 MG tablet    2.  Are you currently out of this medication? Yes

## 2023-03-11 NOTE — Telephone Encounter (Signed)
Spoke to patient to inform him that we did not have samples of Carvedilol in the office. He reports that he is in between insurances. Advised him to try [facility] for his refill. Patient verbalized understanding and agree.

## 2023-07-09 NOTE — Progress Notes (Signed)
 HPI: FU nonischemic cardiomyopathy with EF initially at 15-20%. Cardiac catheterization in 03/2010 revealed normal coronary arteries.  Follow-up echocardiogram March 2023 showed ejection fraction 35 to 40%, grade 1 diastolic dysfunction, mild left atrial enlargement, mild mitral regurgitation, mild aortic insufficiency, mildly dilated aortic root at 38 mm.  Since last seen the patient denies any dyspnea on exertion, orthopnea, PND, pedal edema, palpitations, syncope or chest pain.   Current Outpatient Medications  Medication Sig Dispense Refill   allopurinol (ZYLOPRIM) 100 MG tablet Take 1 tablet (100 mg total) by mouth daily. 90 tablet 1   aspirin EC 81 MG tablet Take 1 tablet (81 mg total) by mouth daily. 30 tablet 0   atorvastatin (LIPITOR) 40 MG tablet Take 1 tablet (40 mg total) by mouth daily. 90 tablet 1   carvedilol (COREG) 12.5 MG tablet Take 1 tablet (12.5 mg total) by mouth 2 (two) times daily with a meal. 60 tablet 12   dapagliflozin propanediol (FARXIGA) 10 MG TABS tablet Take 1 tablet (10 mg total) by mouth daily before breakfast. 90 tablet 1   icosapent Ethyl (VASCEPA) 1 g capsule Take 2 capsules (2 g total) by mouth 2 (two) times daily. 360 capsule 1   levothyroxine (SYNTHROID) 50 MCG tablet Take 1 tablet (50 mcg total) by mouth daily before breakfast. 90 tablet 0   oxyCODONE-acetaminophen (PERCOCET/ROXICET) 5-325 MG tablet Take 1 tablet by mouth every 6 (six) hours as needed for up to 10 doses for severe pain. 10 tablet 0   sacubitril-valsartan (ENTRESTO) 49-51 MG Take 1 tablet by mouth 2 (two) times daily. 60 tablet 11   spironolactone (ALDACTONE) 25 MG tablet Take 0.5 tablets (12.5 mg total) by mouth daily. 90 tablet 3   tiZANidine (ZANAFLEX) 4 MG tablet Take 1 tablet (4 mg total) by mouth every 6 (six) hours as needed for up to 20 doses for muscle spasms. 20 tablet 0   hydrALAZINE (APRESOLINE) 25 MG tablet Take 1 tablet (25 mg total) by mouth 2 (two) times daily. 180  tablet 3   No current facility-administered medications for this visit.     Past Medical History:  Diagnosis Date   Anemia    Arthritis    CHF (congestive heart failure) (HCC)    Chronic renal insufficiency baseline CRE  1.5--1.8   NEPHROLOGIST--  DR WEBB ([facility])   Diabetes mellitus type 2, insulin dependent (HCC) PER pt and LAST PCP NOTE CBG'S IN 100'   RECENTLY UNCONTROLLED AIC 07-07-2012  9.4 AND CBG'S 400'S   GERD (gastroesophageal reflux disease)    Gout, unspecified    stable  per pt   Heart block AV first degree    Heart failure, chronic systolic (HCC)    History of ETOH abuse    Hypertension    CARDIOLOGIST-  DR Jens Som--- LOV IN EPIC 09-08-2011   Nephrolithiasis    RIGHT   Non-ischemic cardiomyopathy (HCC) SECONDARY TO HX UNCONTROLLED HTN AND ETOH ABUSE----  LAST ECHO EF 50%  09-22-2011   a) Echo 03/25/2010: EF 20-25%; mild AI; Mod MR; Mild LAE  b)cardiac cath 03/26/2010: normal cors; severe pulmonary HTN;PCWP 41; EF 10-15%;  c. echo 4/12:  EF 45-50%, mild LVH, grade 1 diast dysfxn, mild AI, mild LAE   Personal history of colonic polyps-adenoma 02/17/2012   01/2012 - diminutive adenoma Repeat colon about 01/2017     Polycystic kidney disease    BILATERAL RENAL CYST   Pure hyperglyceridemia     Past Surgical History:  Procedure Laterality Date   CARDIAC CATHETERIZATION  03-26-2010  DR [place]   SEVERE LV DYSFUNCTION WITH MARKEDLY ELEVATED FILLING PRESSURE/ SEVERE PULMONARY HYPERTENSION/ NORMAL CORONARY ANATOMY   colonoscopy     COLONOSCOPY  2013   CYSTOSCOPY WITH RETROGRADE PYELOGRAM, URETEROSCOPY AND STENT PLACEMENT Right 08/18/2012   Procedure: CYSTOSCOPY WITH RETROGRADE PYELOGRAM, URETEROSCOPY AND STENT PLACEMENT;  Surgeon: Milford Cage, MD;  Location: [facility];  Service: Urology;  Laterality: Right;   KIDNEY STONE SURGERY     X 2   KNEE ARTHROSCOPY W/ MENISCECTOMY Left 02-06-2011   AND CHONDROPLASTY   LUMBAR FUSION  08-10-2003    L5  --  S1   NEPHROLITHOTOMY Right 09/15/2012   Procedure: NEPHROLITHOTOMY PERCUTANEOUS;  Surgeon: Milford Cage, MD;  Location: [facility];  Service: Urology;  Laterality: Right;  RIGHT PERCUTANEOUS NEPHROSTOLITHOTOMY    REMOVAL CYST LEFT LUNG  1994   AT [facility]   benign   RHINOPLASTY  1970'S   nose fx   TRANSTHORACIC ECHOCARDIOGRAM  09-22-2011  DR Taylie Helder   EF 50%/  MILD LVH/ GRADE I DIASTOLIC DYSFUNCTION/ MILD TR    Social History   Socioeconomic History   Marital status: Married    Spouse name: Not on file   Number of children: 1   Years of education: Not on file   Highest education level: Not on file  Occupational History   Occupation: [employer]    Employer: [employer]    Comment: [employer]  Tobacco Use   Smoking status: Every Day    Current packs/day: 0.00    Types: Cigars, Cigarettes   Smokeless tobacco: Never  Vaping Use   Vaping status: Never Used  Substance and Sexual Activity   Alcohol use: Yes    Alcohol/week: 21.0 standard drinks of alcohol    Types: 21 Cans of beer per week    Comment: daily   Drug use: No   Sexual activity: Not Currently  Other Topics Concern   Not on file  Social History Narrative   No regular exercise   Daily caffeine      Social Drivers of Corporate investment banker Strain: Not on file  Food Insecurity: Not on file  Transportation Needs: Not on file  Physical Activity: Not on file  Stress: Not on file  Social Connections: Not on file  Intimate Partner Violence: Not on file    Family History  Problem Relation Age of Onset   Diabetes Mother    Hypertension Mother    Diabetes Father    Hypertension Father    Colon cancer Paternal Grandfather     ROS: no fevers or chills, productive cough, hemoptysis, dysphasia, odynophagia, melena, hematochezia, dysuria, hematuria, rash, seizure activity, orthopnea, PND, pedal edema, claudication. Remaining systems are negative.  Physical Exam: Well-developed  well-nourished in no acute distress.  Skin is warm and dry.  HEENT is normal.  Neck is supple.  Chest is clear to auscultation with normal expansion.  Cardiovascular exam is regular rate and rhythm.  Abdominal exam nontender or distended. No masses palpated. Extremities show no edema. neuro grossly intact  EKG Interpretation Date/Time:  Wednesday July 22 2023 09:24:12 EST Ventricular Rate:  55 PR Interval:  212 QRS Duration:  110 QT Interval:  456 QTC Calculation: 436 R Axis:   10  Text Interpretation: Sinus bradycardia with 1st degree A-V block Moderate voltage criteria for LVH, may be normal variant ( R in aVL , Cornell product ) When compared  with ECG of 06-Sep-2012 12:34, No significant change was found Confirmed by Olga Millers ([id number]) on 07/22/2023 9:32:46 AM    A/P  1 nonischemic cardiomyopathy-plan to continue Entresto (increase to 97/103 twice daily), carvedilol, spironolactone and Farxiga.  Repeat echocardiogram in 6 weeks.  His cardiomyopathy is felt likely hypertensive mediated with potential contribution from ETOH.  If it remains decreased we will plan coronary CTA to rule out obstructive coronary disease.  2 hypertension-blood pressure elevated.  Increase Entresto as outlined above.  Goal systolic blood pressure less than 130 and diastolic less than 85.  If it remains increased despite higher dose of Entresto we will increase hydralazine.  Check potassium and renal function 1 week after increased dose of Entresto.  3 hyperlipidemia-continue statin.  Check lipids and liver.  4 tobacco abuse-patient counseled on discontinuing.  Olga Millers, MD

## 2023-07-22 ENCOUNTER — Encounter: Payer: Self-pay | Admitting: Cardiology

## 2023-07-22 ENCOUNTER — Ambulatory Visit: Payer: BC Managed Care – PPO | Attending: Cardiology | Admitting: Cardiology

## 2023-07-22 VITALS — BP 160/98 | HR 55 | Ht 66.0 in | Wt 169.1 lb

## 2023-07-22 DIAGNOSIS — I5022 Chronic systolic (congestive) heart failure: Secondary | ICD-10-CM | POA: Diagnosis not present

## 2023-07-22 DIAGNOSIS — I10 Essential (primary) hypertension: Secondary | ICD-10-CM

## 2023-07-22 DIAGNOSIS — I428 Other cardiomyopathies: Secondary | ICD-10-CM | POA: Diagnosis not present

## 2023-07-22 DIAGNOSIS — E785 Hyperlipidemia, unspecified: Secondary | ICD-10-CM

## 2023-07-22 MED ORDER — SACUBITRIL-VALSARTAN 97-103 MG PO TABS: 1.0000 | ORAL_TABLET | Freq: Two times a day (BID) | ORAL | 11 refills | Status: AC

## 2023-07-22 NOTE — Patient Instructions (Signed)
 Medication Instructions:   INCREASE ENTRESTO TO 97/103 MG TWICE DAILY = 2 OF THE 49/51 MG TABLETS TWICE DAILY  *If you need a refill on your cardiac medications before your next appointment, please call your pharmacy*   Lab Work:  Your physician recommends that you return for lab work in: [facility]  [place] [address] [place] on the 3 rd floor in [address] Hours-Monday - Friday 8 am-11:30 AM and 1 pm -4 pm   If you have labs (blood work) drawn today and your tests are completely normal, you will receive your results only by: MyChart Message (if you have MyChart) OR A paper copy in the mail If you have any lab test that is abnormal or we need to change your treatment, we will call you to review the results.   Testing/Procedures:  Your physician has requested that you have an echocardiogram. Echocardiography is a painless test that uses sound waves to create images of your heart. It provides your doctor with information about the size and shape of your heart and how well your heart's chambers and valves are working. This procedure takes approximately one hour. There are no restrictions for this procedure. Please do NOT wear cologne, perfume, aftershave, or lotions (deodorant is allowed). Please arrive 15 minutes prior to your appointment time.  Please note: We ask at that you not bring children with you during ultrasound (echo/ vascular) testing. Due to room size and safety concerns, children are not allowed in the ultrasound rooms during exams. Our front office staff cannot provide observation of children in our lobby area while testing is being conducted. An adult accompanying a patient to their appointment will only be allowed in the ultrasound room at the discretion of the ultrasound technician under special circumstances. We apologize for any inconvenience. [facility]-1 ST FLOOR IMAGING DEPARTMENT   Follow-Up: At [facility], you and your health  needs are our priority.  As part of our continuing mission to provide you with exceptional heart care, we have created designated Provider Care Teams.  These Care Teams include your primary Cardiologist (physician) and Advanced Practice Providers (APPs -  Physician Assistants and Nurse Practitioners) who all work together to provide you with the care you need, when you need it.  We recommend signing up for the patient portal called "MyChart".  Sign up information is provided on this After Visit Summary.  MyChart is used to connect with patients for Virtual Visits (Telemedicine).  Patients are able to view lab/test results, encounter notes, upcoming appointments, etc.  Non-urgent messages can be sent to your provider as well.   To learn more about what you can do with MyChart, go to [email or web address].    Your next appointment:   6 month(s)  Provider:   Olga Millers, MD

## 2023-08-31 ENCOUNTER — Ambulatory Visit (HOSPITAL_BASED_OUTPATIENT_CLINIC_OR_DEPARTMENT_OTHER): Payer: Self-pay

## 2023-09-07 ENCOUNTER — Emergency Department (HOSPITAL_BASED_OUTPATIENT_CLINIC_OR_DEPARTMENT_OTHER): Payer: Self-pay

## 2023-09-07 ENCOUNTER — Encounter (HOSPITAL_BASED_OUTPATIENT_CLINIC_OR_DEPARTMENT_OTHER): Payer: Self-pay | Admitting: Emergency Medicine

## 2023-09-07 ENCOUNTER — Emergency Department (HOSPITAL_BASED_OUTPATIENT_CLINIC_OR_DEPARTMENT_OTHER)
Admission: EM | Admit: 2023-09-07 | Discharge: 2023-09-07 | Disposition: A | Discharge status: Home / Self Care | Payer: Self-pay | Attending: Emergency Medicine | Admitting: Emergency Medicine

## 2023-09-07 ENCOUNTER — Other Ambulatory Visit: Payer: Self-pay

## 2023-09-07 DIAGNOSIS — S61216A Laceration without foreign body of right little finger without damage to nail, initial encounter: Secondary | ICD-10-CM | POA: Insufficient documentation

## 2023-09-07 DIAGNOSIS — Z7982 Long term (current) use of aspirin: Secondary | ICD-10-CM | POA: Insufficient documentation

## 2023-09-07 DIAGNOSIS — W1830XA Fall on same level, unspecified, initial encounter: Secondary | ICD-10-CM | POA: Insufficient documentation

## 2023-09-07 DIAGNOSIS — F17210 Nicotine dependence, cigarettes, uncomplicated: Secondary | ICD-10-CM | POA: Insufficient documentation

## 2023-09-07 MED ORDER — CEFADROXIL 500 MG PO CAPS: 500.0000 mg | ORAL_CAPSULE | Freq: Two times a day (BID) | ORAL | 0 refills | Status: AC

## 2023-09-07 MED ORDER — CEPHALEXIN 250 MG PO CAPS
500.0000 mg | ORAL_CAPSULE | Freq: Once | ORAL | Status: AC
  Administered 2023-09-07: 500 mg via ORAL
  Filled 2023-09-07: qty 2

## 2023-09-07 MED ORDER — LIDOCAINE HCL (PF) 1 % IJ SOLN
10.0000 mL | Freq: Once | INTRAMUSCULAR | Status: AC
  Administered 2023-09-07: 10 mL
  Filled 2023-09-07: qty 10

## 2023-09-07 NOTE — ED Notes (Signed)
 Questions and concerns addressed. Discharge teaching completed.   Prescriptions reviewed and pharmacy verified.   Patient ambulatory upon discharge.

## 2023-09-07 NOTE — ED Triage Notes (Addendum)
 Right fifth digit laceration , reports was intoxicated last night , fell with gall bottle in hand . Bleeding controlled . Happened at 1 Am early this morning .

## 2023-09-07 NOTE — Discharge Instructions (Addendum)
 As discussed, your laceration was repaired using nonabsorbable stitches.  They should be removed within the next 7 to 10 days.  Wash area gently with warm soapy water.  You may take Tylenol  for pain.  Look for signs of infection as we discussed but will place you on antibiotics for prevention of infection.  You may return to urgent care, family doctor or the emergency department for suture removal.

## 2023-09-07 NOTE — ED Notes (Signed)
 Patient transported to X-ray

## 2023-09-07 NOTE — ED Provider Notes (Signed)
 [facility] AT [facility] Provider Note   CSN: [id number] Arrival date & time: 09/07/23  1901     History  Chief Complaint  Patient presents with   finger lac    Rt pinky    Mike Orozco is a 63 y.o. male.  HPI   63 year old male presents emergency department complaints of laceration.  States that earlier this morning around 1 AM, was drinking alcohol and fell landing on his right arm with a rib on his hand.  States that the beer bottle broke causing a cut on his pinky finger.  Denies trauma to head, LOC.  Denies any chest pain, shortness of breath, abdominal pain.  States that the laceration is stinging but denies any pain elsewhere.  Denies any weakness or sensory deficits that affected digit.  Home Medications Prior to Admission medications   Medication Sig Start Date End Date Taking? Authorizing Provider  cefadroxil  (DURICEF) 500 MG capsule Take 1 capsule (500 mg total) by mouth 2 (two) times daily. 09/07/23  Yes Neil Balls A, PA  allopurinol  (ZYLOPRIM ) 100 MG tablet Take 1 tablet (100 mg total) by mouth daily. 03/26/20   Arcadio Knuckles, MD  aspirin  EC 81 MG tablet Take 1 tablet (81 mg total) by mouth daily. 12/24/17   Arcadio Knuckles, MD  atorvastatin  (LIPITOR) 40 MG tablet Take 1 tablet (40 mg total) by mouth daily. 03/20/21   Lenise Quince, MD  carvedilol  (COREG ) 12.5 MG tablet Take 1 tablet (12.5 mg total) by mouth 2 (two) times daily with a meal. 10/01/22   Crenshaw, Deannie Fabian, MD  dapagliflozin  propanediol (FARXIGA ) 10 MG TABS tablet Take 1 tablet (10 mg total) by mouth daily before breakfast. 03/20/21   Lenise Quince, MD  hydrALAZINE  (APRESOLINE ) 25 MG tablet Take 1 tablet (25 mg total) by mouth 2 (two) times daily. 07/10/21 10/08/21  Lenise Quince, MD  icosapent  Ethyl (VASCEPA ) 1 g capsule Take 2 capsules (2 g total) by mouth 2 (two) times daily. 03/26/20   Arcadio Knuckles, MD  levothyroxine  (SYNTHROID ) 50 MCG tablet Take 1 tablet (50  mcg total) by mouth daily before breakfast. 03/26/20   Arcadio Knuckles, MD  oxyCODONE -acetaminophen  (PERCOCET/ROXICET) 5-325 MG tablet Take 1 tablet by mouth every 6 (six) hours as needed for up to 10 doses for severe pain. 05/13/21   Billie Budge, MD  sacubitril -valsartan  (ENTRESTO ) 97-103 MG Take 1 tablet by mouth 2 (two) times daily. 07/22/23   Lenise Quince, MD  spironolactone  (ALDACTONE ) 25 MG tablet Take 0.5 tablets (12.5 mg total) by mouth daily. 09/25/21   Lenise Quince, MD  tiZANidine  (ZANAFLEX ) 4 MG tablet Take 1 tablet (4 mg total) by mouth every 6 (six) hours as needed for up to 20 doses for muscle spasms. 05/13/21   Billie Budge, MD      Allergies    Lisinopril , Metformin , and Metformin  and related    Review of Systems   Review of Systems  All other systems reviewed and are negative.   Physical Exam Updated Vital Signs BP (!) 182/96   Pulse (!) 56   Temp 98.2 F (36.8 C) (Oral)   Resp 18   Wt 72.6 kg   SpO2 98%   BMI 25.82 kg/m  Physical Exam Vitals and nursing note reviewed.  Constitutional:      General: He is not in acute distress.    Appearance: He is well-developed.  HENT:  Head: Normocephalic and atraumatic.  Eyes:     Conjunctiva/sclera: Conjunctivae normal.  Cardiovascular:     Rate and Rhythm: Normal rate and regular rhythm.  Pulmonary:     Effort: Pulmonary effort is normal. No respiratory distress.     Breath sounds: Normal breath sounds.  Abdominal:     Palpations: Abdomen is soft.     Tenderness: There is no abdominal tenderness.  Musculoskeletal:        General: No swelling.     Cervical back: Neck supple.     Comments: Full range of motion digits of right hand.  2 cm,C shaped laceration appreciated on the pad of distal phalanx fifth digit of right hand.  No underlying bony tenderness.  Skin:    General: Skin is warm and dry.     Capillary Refill: Capillary refill takes less than 2 seconds.  Neurological:     Mental Status: He  is alert.  Psychiatric:        Mood and Affect: Mood normal.     ED Results / Procedures / Treatments   Labs (all labs ordered are listed, but only abnormal results are displayed) Labs Reviewed - No data to display  EKG None  Radiology No results found.  Procedures .Laceration Repair  Date/Time: 09/07/2023 8:11 PM  Performed by: [place] Butter, PA Authorized by: [address]acey [address] Butter, PA   Consent:    Consent obtained:  Verbal   Consent given by:  Patient   Risks, benefits, and alternatives were discussed: yes     Risks discussed:  Infection, need for additional repair, nerve damage, poor wound healing, poor cosmetic result and pain   Alternatives discussed:  No treatment and delayed treatment Universal protocol:    Procedure explained and questions answered to patient or proxy's satisfaction: yes     Patient identity confirmed:  Verbally with patient Anesthesia:    Anesthesia method:  Local infiltration   Local anesthetic:  Lidocaine  1% w/o epi Laceration details:    Location:  Finger   Finger location:  R small finger   Length (cm):  2 Pre-procedure details:    Preparation:  Patient was prepped and draped in usual sterile fashion and imaging obtained to evaluate for foreign bodies Exploration:    Limited defect created (wound extended): no     Imaging obtained: x-ray     Imaging outcome: foreign body not noted     Wound exploration: wound explored through full range of motion and entire depth of wound visualized     Contaminated: yes   Treatment:    Area cleansed with:  Saline   Amount of cleaning:  Standard   Irrigation solution:  Sterile saline   Irrigation volume:  250cc   Irrigation method:  Syringe   Visualized foreign bodies/material removed: no     Debridement:  None   Undermining:  None   Scar revision: no   Skin repair:    Repair method:  Sutures   Suture size:  4-0   Suture material:  Prolene   Suture technique:  Simple interrupted   Number of  sutures:  3 Approximation:    Approximation:  Close Repair type:    Repair type:  Simple Post-procedure details:    Dressing:  Splint for protection and non-adherent dressing   Procedure completion:  Tolerated well, no immediate complications     Medications Ordered in ED Medications  lidocaine  (PF) (XYLOCAINE ) 1 % injection 10 mL (10 mLs Other Given 09/07/23 2008)  cephALEXin  (KEFLEX ) capsule 500 mg (500 mg Oral Given 09/07/23 2007)    ED Course/ Medical Decision Making/ A&P                                 Medical Decision Making Amount and/or Complexity of Data Reviewed Radiology: ordered.  Risk Prescription drug management.   This patient presents to the ED for concern of finger pain, this involves an extensive number of treatment options, and is a complaint that carries with it a high risk of complications and morbidity.  The differential diagnosis includes fracture, strain/pain, dislocation, ligament/tendon injury, neurovascular, mice, foreign body retainment, other   Co morbidities that complicate the patient evaluation  See HPI   Additional history obtained:  Additional history obtained from EMR External records from outside source obtained and reviewed including hospital records   Lab Tests:  N/a   Imaging Studies ordered:  I ordered imaging studies including x-ray little finger right  I independently visualized and interpreted imaging which showed no acute osseous abnormality.  Soft tissue injury. I agree with the radiologist interpretation   Cardiac Monitoring: / EKG:  N/a   Consultations Obtained:  N/a   Problem List / ED Course / Critical interventions / Medication management  Finger laceration I ordered medication including lidocaine    Reevaluation of the patient after these medicines showed that the patient improved I have reviewed the patients home medicines and have made adjustments as needed   Social Determinants of  Health:  Chronic alcohol abuse.  Chronic cigarette use.  Medical noncompliance.   Test / Admission - Considered:  Finger laceration Vitals signs significant for hypertension blood pressure 182/96.  Has had none of his at home blood pressure medications in the past 2 days.. Otherwise within normal range and stable throughout visit. Laboratory/imaging studies significant for: See above 63 year old male presents emergency department complaints of laceration.  States that earlier this morning around 1 AM, was drinking alcohol and fell landing on his right arm with a rib on his hand.  States that the beer bottle broke causing a cut on his pinky finger.  Denies trauma to head, LOC.  Denies any chest pain, shortness of breath, abdominal pain.  States that the laceration is stinging but denies any pain elsewhere.  Denies any weakness or sensory deficits that affected digit. On exam, 2 cm laceration appreciable in the pad of right fifth digit distal phalanx.  No underlying bony tenderness or appreciable foreign body present.  X-ray obtained which is reassuring.  Laceration repaired manner as above.  Patient educated regarding proper wound care at home and follow-up for suture removal as described in AVS.  Treatment plan discussed with patient and he acknowledged understanding was agreeable to said plan.  Patient overall well-appearing, afebrile in no acute distress. Worrisome signs and symptoms were discussed with the patient, and the patient acknowledged understanding to return to the ED if noticed. Patient was stable upon discharge.          Final Clinical Impression(s) / ED Diagnoses Final diagnoses:  Laceration of right little finger without foreign body without damage to nail, initial encounter    Rx / DC Orders ED Discharge Orders          Ordered    cefadroxil  (DURICEF) 500 MG capsule  2 times daily        09/07/23 2001  [place] Butter, [place] 09/07/23 2107     Tegeler, Marine Sia, MD 09/07/23 [phone]

## 2023-09-08 ENCOUNTER — Telehealth: Payer: Self-pay

## 2023-09-08 NOTE — Transitions of Care (Post Inpatient/ED Visit) (Signed)
   09/08/2023  Name: Mike Orozco MRN: [id number] DOB: Sep 27, 1960  Today's TOC FU Call Status:    Attempted to reach the patient regarding the most recent Inpatient/ED visit.  Follow Up Plan: Additional outreach attempts will be made to reach the patient to complete the Transitions of Care (Post Inpatient/ED visit) call.   Signature Mandolin Falwell,CMA

## 2023-09-18 ENCOUNTER — Encounter (HOSPITAL_BASED_OUTPATIENT_CLINIC_OR_DEPARTMENT_OTHER): Payer: Self-pay

## 2023-09-18 ENCOUNTER — Emergency Department (HOSPITAL_BASED_OUTPATIENT_CLINIC_OR_DEPARTMENT_OTHER)
Admission: EM | Admit: 2023-09-18 | Discharge: 2023-09-18 | Disposition: A | Discharge status: Home / Self Care | Payer: PRIVATE HEALTH INSURANCE | Attending: Emergency Medicine | Admitting: Emergency Medicine

## 2023-09-18 DIAGNOSIS — Z79899 Other long term (current) drug therapy: Secondary | ICD-10-CM | POA: Insufficient documentation

## 2023-09-18 DIAGNOSIS — I509 Heart failure, unspecified: Secondary | ICD-10-CM | POA: Insufficient documentation

## 2023-09-18 DIAGNOSIS — Z7982 Long term (current) use of aspirin: Secondary | ICD-10-CM | POA: Insufficient documentation

## 2023-09-18 DIAGNOSIS — E119 Type 2 diabetes mellitus without complications: Secondary | ICD-10-CM | POA: Insufficient documentation

## 2023-09-18 DIAGNOSIS — Z4802 Encounter for removal of sutures: Secondary | ICD-10-CM | POA: Diagnosis present

## 2023-09-18 NOTE — Discharge Instructions (Signed)
 Your stitches were removed on the emergency department.  You may continue to wash area gently with warm soapy water.  Return if you develop symptoms concerning for infection or if the repaired cut breaks apart.

## 2023-09-18 NOTE — ED Provider Notes (Signed)
 [facility] Provider Note   CSN: [id number] Arrival date & time: 09/18/23  4098     History  Chief Complaint  Patient presents with   Suture / Staple Removal    Mike Orozco is a 63 y.o. male.   Suture / Staple Removal    63 year old male presents to the emergency department with request of suture removal.  Was seen on 09/07/2023 and had 3 sutures placed in his right little finger.  Was placed on antibiotics at that time of which he took the first dose and did not complete course.  States that the wound has been healing well.  No drainage, worsening pain.  Reports no weakness of affected digit.  Past medical history significant for CHF, diabetes mellitus type 2, GERD, first-degree AV block, nonischemic cardiomyopathy, chronic renal insufficiency, hyperlipidemia, hypothyroidism polycystic kidney disease, alcohol abuse  Home Medications Prior to Admission medications   Medication Sig Start Date End Date Taking? Authorizing Provider  allopurinol  (ZYLOPRIM ) 100 MG tablet Take 1 tablet (100 mg total) by mouth daily. 03/26/20   Arcadio Knuckles, MD  aspirin  EC 81 MG tablet Take 1 tablet (81 mg total) by mouth daily. 12/24/17   Arcadio Knuckles, MD  atorvastatin  (LIPITOR) 40 MG tablet Take 1 tablet (40 mg total) by mouth daily. 03/20/21   Lenise Quince, MD  carvedilol  (COREG ) 12.5 MG tablet Take 1 tablet (12.5 mg total) by mouth 2 (two) times daily with a meal. 10/01/22   Crenshaw, Deannie Fabian, MD  cefadroxil  (DURICEF) 500 MG capsule Take 1 capsule (500 mg total) by mouth 2 (two) times daily. 09/07/23   Linn Butter, PA  dapagliflozin  propanediol (FARXIGA ) 10 MG TABS tablet Take 1 tablet (10 mg total) by mouth daily before breakfast. 03/20/21   Lenise Quince, MD  hydrALAZINE  (APRESOLINE ) 25 MG tablet Take 1 tablet (25 mg total) by mouth 2 (two) times daily. 07/10/21 10/08/21  Lenise Quince, MD  icosapent  Ethyl (VASCEPA ) 1 g capsule Take 2  capsules (2 g total) by mouth 2 (two) times daily. 03/26/20   Arcadio Knuckles, MD  levothyroxine  (SYNTHROID ) 50 MCG tablet Take 1 tablet (50 mcg total) by mouth daily before breakfast. 03/26/20   Arcadio Knuckles, MD  oxyCODONE -acetaminophen  (PERCOCET/ROXICET) 5-325 MG tablet Take 1 tablet by mouth every 6 (six) hours as needed for up to 10 doses for severe pain. 05/13/21   Billie Budge, MD  sacubitril -valsartan  (ENTRESTO ) 97-103 MG Take 1 tablet by mouth 2 (two) times daily. 07/22/23   Lenise Quince, MD  spironolactone  (ALDACTONE ) 25 MG tablet Take 0.5 tablets (12.5 mg total) by mouth daily. 09/25/21   Lenise Quince, MD  tiZANidine  (ZANAFLEX ) 4 MG tablet Take 1 tablet (4 mg total) by mouth every 6 (six) hours as needed for up to 20 doses for muscle spasms. 05/13/21   Billie Budge, MD      Allergies    Lisinopril , Metformin , and Metformin  and related    Review of Systems   Review of Systems  All other systems reviewed and are negative.   Physical Exam Updated Vital Signs BP (!) 220/118 (BP Location: Right Arm)   Pulse (!) 53   Temp 98.2 F (36.8 C) (Oral)   Resp 18   Ht 5\' 6"  (1.676 m)   Wt 72.6 kg   SpO2 99%   BMI 25.83 kg/m  Physical Exam Vitals and nursing note reviewed.  Constitutional:  General: He is not in acute distress.    Appearance: He is well-developed.  HENT:     Head: Normocephalic and atraumatic.  Eyes:     Conjunctiva/sclera: Conjunctivae normal.  Cardiovascular:     Rate and Rhythm: Normal rate and regular rhythm.     Heart sounds: No murmur heard. Pulmonary:     Effort: Pulmonary effort is normal. No respiratory distress.     Breath sounds: Normal breath sounds.  Abdominal:     Palpations: Abdomen is soft.     Tenderness: There is no abdominal tenderness.  Musculoskeletal:        General: No swelling.     Cervical back: Neck supple.     Comments: Patient with prior laceration repaired palmar aspect right fifth digit of hand distal phalanx.   Area well-approximated without surrounding erythema, expressible drainage, induration.  3 sutures in place.  Full range of motion of digit.  Skin:    General: Skin is warm and dry.     Capillary Refill: Capillary refill takes less than 2 seconds.  Neurological:     Mental Status: He is alert.  Psychiatric:        Mood and Affect: Mood normal.     ED Results / Procedures / Treatments   Labs (all labs ordered are listed, but only abnormal results are displayed) Labs Reviewed - No data to display  EKG None  Radiology No results found.  Procedures Suture Removal  Date/Time: 09/18/2023 9:54 AM  Performed by: [place] Butter, PA Authorized by: [place] Butter, PA   Consent:    Consent obtained:  Verbal   Consent given by:  Patient   Risks, benefits, and alternatives were discussed: yes     Risks discussed:  Bleeding, pain and wound separation   Alternatives discussed:  No treatment and delayed treatment Universal protocol:    Procedure explained and questions answered to patient or proxy's satisfaction: yes     Patient identity confirmed:  Verbally with patient Location:    Location:  Upper extremity   Upper extremity location:  Hand   Hand location:  R small finger Procedure details:    Wound appearance:  No signs of infection, good wound healing and clean   Number of sutures removed:  3 Post-procedure details:    Post-removal:  Band-Aid applied   Procedure completion:  Tolerated well, no immediate complications     Medications Ordered in ED Medications - No data to display  ED Course/ Medical Decision Making/ A&P                                 Medical Decision Making  This patient presents to the ED for concern of suture removal, this involves an extensive number of treatment options, and is a complaint that carries with it a high risk of complications and morbidity.  The differential diagnosis includes wound dehiscence, cellulitis, abscess formation,  necrotizing infection, other   Co morbidities that complicate the patient evaluation  See HPI   Additional history obtained:  Additional history obtained from EMR External records from outside source obtained and reviewed including hospital records   Lab Tests:  N/a   Imaging Studies ordered:  N/a   Cardiac Monitoring: / EKG:  N/a   Consultations Obtained:  N/a   Problem List / ED Course / Critical interventions / Medication management  Suture removal Reevaluation of the patien t showed that  the patient stayed the same I have reviewed the patients home medicines and have made adjustments as needed   Social Determinants of Health:  Alcohol abuse.   Test / Admission - Considered:  Suture removal Vitals signs significant for hypertension initially blood pressure 220/118.  Patient did not take his antihypertensive medications last night.  Just took them prior to arrival.  Repeat blood pressure improved. Otherwise within normal range and stable throughout visit. 63 year old male presents to the emergency department with request of suture removal.  Was seen on 09/07/2023 and had 3 sutures placed in his right little finger.  Was placed on antibiotics at that time On exam, wound well-approximated.  No secondary skin changes concerning for infectious process.  Suture removed in manner as above.  Patient educated regarding post wound care following suture removal.  Patient also found to be hypertensive initially in the ED.  Has been intermittently compliant with at home antihypertensive medications.  Just took blood pressure medications prior to arrival.  Repeat blood pressure showed improvement.  Will recommend adherence with blood pressure medications and follow-up with PCP in the outpatient setting for continued evaluation/management of blood pressure.  Treatment plan discussed with patient and he acknowledged understanding was agreeable to said plan.  Patient will  well-appearing, afebrile in no acute distress. Worrisome signs and symptoms were discussed with the patient, and the patient acknowledged understanding to return to the ED if noticed. Patient was stable upon discharge.          Final Clinical Impression(s) / ED Diagnoses Final diagnoses:  None    Rx / DC Orders ED Discharge Orders     None         Armstrong Butter, [place] 09/18/23 1016    Roberts Ching, MD 09/23/23 1743

## 2023-09-18 NOTE — ED Triage Notes (Signed)
 Pt is here for stiches to be removed from right pinky finger.

## 2023-09-21 ENCOUNTER — Telehealth: Payer: Self-pay

## 2023-09-21 NOTE — Transitions of Care (Post Inpatient/ED Visit) (Signed)
   09/21/2023  Name: Mike Orozco MRN: [id number] DOB: 1960/06/04  Today's TOC FU Call Status: Today's TOC FU Call Status:: Unsuccessful Call (1st Attempt)  Attempted to reach the patient regarding the most recent Inpatient/ED visit.  Follow Up Plan: Additional outreach attempts will be made to reach the patient to complete the Transitions of Care (Post Inpatient/ED visit) call.   Signature : Devon Fogo, CMA

## 2023-09-29 ENCOUNTER — Ambulatory Visit (HOSPITAL_BASED_OUTPATIENT_CLINIC_OR_DEPARTMENT_OTHER): Payer: PRIVATE HEALTH INSURANCE

## 2023-11-03 ENCOUNTER — Ambulatory Visit (HOSPITAL_BASED_OUTPATIENT_CLINIC_OR_DEPARTMENT_OTHER): Payer: PRIVATE HEALTH INSURANCE | Attending: Cardiology
